# Patient Record
Sex: Female | Born: 1935 | Race: White | Hispanic: No | Smoking: Former smoker
Health system: Southern US, Community
[De-identification: ages and names within clinical notes are randomized; demographics above are authoritative.]

## PROBLEM LIST (undated history)

## (undated) DIAGNOSIS — F32A Depression, unspecified: Secondary | ICD-10-CM

## (undated) DIAGNOSIS — R011 Cardiac murmur, unspecified: Secondary | ICD-10-CM

## (undated) DIAGNOSIS — K219 Gastro-esophageal reflux disease without esophagitis: Secondary | ICD-10-CM

## (undated) DIAGNOSIS — G629 Polyneuropathy, unspecified: Secondary | ICD-10-CM

## (undated) DIAGNOSIS — E039 Hypothyroidism, unspecified: Secondary | ICD-10-CM

## (undated) DIAGNOSIS — F329 Major depressive disorder, single episode, unspecified: Secondary | ICD-10-CM

## (undated) DIAGNOSIS — G4730 Sleep apnea, unspecified: Secondary | ICD-10-CM

## (undated) DIAGNOSIS — M5136 Other intervertebral disc degeneration, lumbar region: Secondary | ICD-10-CM

## (undated) DIAGNOSIS — C4490 Unspecified malignant neoplasm of skin, unspecified: Secondary | ICD-10-CM

## (undated) DIAGNOSIS — C801 Malignant (primary) neoplasm, unspecified: Secondary | ICD-10-CM

## (undated) DIAGNOSIS — H269 Unspecified cataract: Secondary | ICD-10-CM

## (undated) DIAGNOSIS — T7840XA Allergy, unspecified, initial encounter: Secondary | ICD-10-CM

## (undated) DIAGNOSIS — J45909 Unspecified asthma, uncomplicated: Secondary | ICD-10-CM

## (undated) DIAGNOSIS — E785 Hyperlipidemia, unspecified: Secondary | ICD-10-CM

## (undated) DIAGNOSIS — E079 Disorder of thyroid, unspecified: Secondary | ICD-10-CM

## (undated) HISTORY — DX: Malignant (primary) neoplasm, unspecified: C80.1

## (undated) HISTORY — DX: Hyperlipidemia, unspecified: E78.5

## (undated) HISTORY — DX: Disorder of thyroid, unspecified: E07.9

## (undated) HISTORY — PX: SPINE SURGERY: SHX786

## (undated) HISTORY — DX: Allergy, unspecified, initial encounter: T78.40XA

## (undated) HISTORY — DX: Unspecified cataract: H26.9

## (undated) HISTORY — DX: Other intervertebral disc degeneration, lumbar region: M51.36

## (undated) HISTORY — DX: Cardiac murmur, unspecified: R01.1

## (undated) HISTORY — DX: Unspecified asthma, uncomplicated: J45.909

## (undated) HISTORY — DX: Polyneuropathy, unspecified: G62.9

## (undated) HISTORY — DX: Gastro-esophageal reflux disease without esophagitis: K21.9

## (undated) HISTORY — PX: CHOLECYSTECTOMY: SHX55

## (undated) HISTORY — DX: Major depressive disorder, single episode, unspecified: F32.9

## (undated) HISTORY — DX: Depression, unspecified: F32.A

---
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
## 2010-09-15 HISTORY — PX: CATARACT EXTRACTION: SUR2

## 2011-09-16 HISTORY — PX: OTHER SURGICAL HISTORY: SHX169

## 2013-09-15 HISTORY — PX: CARPAL TUNNEL RELEASE: SHX101

## 2013-09-15 HISTORY — PX: SALIVARY GLAND SURGERY: SHX768

## 2014-03-24 DIAGNOSIS — K118 Other diseases of salivary glands: Secondary | ICD-10-CM | POA: Insufficient documentation

## 2015-06-28 DIAGNOSIS — R202 Paresthesia of skin: Secondary | ICD-10-CM | POA: Insufficient documentation

## 2015-06-28 DIAGNOSIS — E785 Hyperlipidemia, unspecified: Secondary | ICD-10-CM | POA: Insufficient documentation

## 2015-06-28 DIAGNOSIS — D119 Benign neoplasm of major salivary gland, unspecified: Secondary | ICD-10-CM | POA: Insufficient documentation

## 2015-06-28 DIAGNOSIS — L403 Pustulosis palmaris et plantaris: Secondary | ICD-10-CM | POA: Insufficient documentation

## 2015-06-28 DIAGNOSIS — R911 Solitary pulmonary nodule: Secondary | ICD-10-CM | POA: Insufficient documentation

## 2015-06-28 DIAGNOSIS — R1031 Right lower quadrant pain: Secondary | ICD-10-CM | POA: Insufficient documentation

## 2015-06-28 DIAGNOSIS — M25552 Pain in left hip: Secondary | ICD-10-CM | POA: Insufficient documentation

## 2015-06-28 DIAGNOSIS — R1012 Left upper quadrant pain: Secondary | ICD-10-CM | POA: Insufficient documentation

## 2015-06-28 DIAGNOSIS — M25551 Pain in right hip: Secondary | ICD-10-CM | POA: Insufficient documentation

## 2015-06-28 DIAGNOSIS — R1032 Left lower quadrant pain: Secondary | ICD-10-CM | POA: Insufficient documentation

## 2015-06-28 DIAGNOSIS — R5383 Other fatigue: Secondary | ICD-10-CM | POA: Insufficient documentation

## 2015-06-28 DIAGNOSIS — J340 Abscess, furuncle and carbuncle of nose: Secondary | ICD-10-CM | POA: Insufficient documentation

## 2015-06-28 DIAGNOSIS — C44310 Basal cell carcinoma of skin of unspecified parts of face: Secondary | ICD-10-CM | POA: Insufficient documentation

## 2015-06-28 DIAGNOSIS — G8929 Other chronic pain: Secondary | ICD-10-CM | POA: Insufficient documentation

## 2015-06-28 DIAGNOSIS — M2550 Pain in unspecified joint: Secondary | ICD-10-CM | POA: Insufficient documentation

## 2015-07-05 DIAGNOSIS — E039 Hypothyroidism, unspecified: Secondary | ICD-10-CM | POA: Insufficient documentation

## 2015-07-27 DIAGNOSIS — M4802 Spinal stenosis, cervical region: Secondary | ICD-10-CM | POA: Insufficient documentation

## 2015-07-27 DIAGNOSIS — M4310 Spondylolisthesis, site unspecified: Secondary | ICD-10-CM | POA: Insufficient documentation

## 2015-07-27 DIAGNOSIS — M47816 Spondylosis without myelopathy or radiculopathy, lumbar region: Secondary | ICD-10-CM | POA: Insufficient documentation

## 2015-08-20 DIAGNOSIS — C679 Malignant neoplasm of bladder, unspecified: Secondary | ICD-10-CM | POA: Insufficient documentation

## 2015-09-21 DIAGNOSIS — C672 Malignant neoplasm of lateral wall of bladder: Secondary | ICD-10-CM | POA: Diagnosis not present

## 2015-09-28 DIAGNOSIS — C672 Malignant neoplasm of lateral wall of bladder: Secondary | ICD-10-CM | POA: Diagnosis not present

## 2015-10-05 DIAGNOSIS — C672 Malignant neoplasm of lateral wall of bladder: Secondary | ICD-10-CM | POA: Diagnosis not present

## 2015-10-12 DIAGNOSIS — C672 Malignant neoplasm of lateral wall of bladder: Secondary | ICD-10-CM | POA: Diagnosis not present

## 2015-10-19 DIAGNOSIS — C672 Malignant neoplasm of lateral wall of bladder: Secondary | ICD-10-CM | POA: Diagnosis not present

## 2015-10-26 DIAGNOSIS — C672 Malignant neoplasm of lateral wall of bladder: Secondary | ICD-10-CM | POA: Diagnosis not present

## 2015-11-03 DIAGNOSIS — M5410 Radiculopathy, site unspecified: Secondary | ICD-10-CM | POA: Diagnosis not present

## 2015-11-03 DIAGNOSIS — L0100 Impetigo, unspecified: Secondary | ICD-10-CM | POA: Diagnosis not present

## 2015-11-07 DIAGNOSIS — C44729 Squamous cell carcinoma of skin of left lower limb, including hip: Secondary | ICD-10-CM | POA: Diagnosis not present

## 2015-11-15 DIAGNOSIS — D119 Benign neoplasm of major salivary gland, unspecified: Secondary | ICD-10-CM | POA: Diagnosis not present

## 2015-11-15 DIAGNOSIS — R220 Localized swelling, mass and lump, head: Secondary | ICD-10-CM | POA: Diagnosis not present

## 2015-11-19 DIAGNOSIS — R42 Dizziness and giddiness: Secondary | ICD-10-CM | POA: Diagnosis not present

## 2015-12-07 DIAGNOSIS — C679 Malignant neoplasm of bladder, unspecified: Secondary | ICD-10-CM | POA: Diagnosis not present

## 2015-12-10 DIAGNOSIS — J069 Acute upper respiratory infection, unspecified: Secondary | ICD-10-CM | POA: Diagnosis not present

## 2015-12-10 DIAGNOSIS — C679 Malignant neoplasm of bladder, unspecified: Secondary | ICD-10-CM | POA: Diagnosis not present

## 2015-12-10 DIAGNOSIS — R5383 Other fatigue: Secondary | ICD-10-CM | POA: Diagnosis not present

## 2015-12-10 DIAGNOSIS — B9789 Other viral agents as the cause of diseases classified elsewhere: Secondary | ICD-10-CM | POA: Diagnosis not present

## 2015-12-21 DIAGNOSIS — D494 Neoplasm of unspecified behavior of bladder: Secondary | ICD-10-CM | POA: Diagnosis not present

## 2015-12-21 DIAGNOSIS — R399 Unspecified symptoms and signs involving the genitourinary system: Secondary | ICD-10-CM | POA: Diagnosis not present

## 2015-12-21 DIAGNOSIS — Z713 Dietary counseling and surveillance: Secondary | ICD-10-CM | POA: Diagnosis not present

## 2015-12-21 DIAGNOSIS — Z8551 Personal history of malignant neoplasm of bladder: Secondary | ICD-10-CM | POA: Diagnosis not present

## 2015-12-24 DIAGNOSIS — E279 Disorder of adrenal gland, unspecified: Secondary | ICD-10-CM | POA: Diagnosis not present

## 2015-12-24 DIAGNOSIS — Z8551 Personal history of malignant neoplasm of bladder: Secondary | ICD-10-CM | POA: Diagnosis not present

## 2015-12-24 DIAGNOSIS — D494 Neoplasm of unspecified behavior of bladder: Secondary | ICD-10-CM | POA: Diagnosis not present

## 2015-12-24 DIAGNOSIS — C679 Malignant neoplasm of bladder, unspecified: Secondary | ICD-10-CM | POA: Diagnosis not present

## 2016-01-02 DIAGNOSIS — Z01812 Encounter for preprocedural laboratory examination: Secondary | ICD-10-CM | POA: Diagnosis not present

## 2016-01-02 DIAGNOSIS — D494 Neoplasm of unspecified behavior of bladder: Secondary | ICD-10-CM | POA: Diagnosis not present

## 2016-01-08 DIAGNOSIS — M5442 Lumbago with sciatica, left side: Secondary | ICD-10-CM | POA: Diagnosis not present

## 2016-01-08 DIAGNOSIS — E782 Mixed hyperlipidemia: Secondary | ICD-10-CM | POA: Diagnosis not present

## 2016-01-08 DIAGNOSIS — M438X9 Other specified deforming dorsopathies, site unspecified: Secondary | ICD-10-CM | POA: Diagnosis not present

## 2016-01-08 DIAGNOSIS — K219 Gastro-esophageal reflux disease without esophagitis: Secondary | ICD-10-CM | POA: Diagnosis not present

## 2016-01-08 DIAGNOSIS — E038 Other specified hypothyroidism: Secondary | ICD-10-CM | POA: Diagnosis not present

## 2016-01-08 DIAGNOSIS — Z1389 Encounter for screening for other disorder: Secondary | ICD-10-CM | POA: Diagnosis not present

## 2016-01-08 DIAGNOSIS — I519 Heart disease, unspecified: Secondary | ICD-10-CM | POA: Diagnosis not present

## 2016-01-10 DIAGNOSIS — C679 Malignant neoplasm of bladder, unspecified: Secondary | ICD-10-CM | POA: Diagnosis not present

## 2016-01-15 DIAGNOSIS — J45909 Unspecified asthma, uncomplicated: Secondary | ICD-10-CM | POA: Diagnosis not present

## 2016-01-15 DIAGNOSIS — N3020 Other chronic cystitis without hematuria: Secondary | ICD-10-CM | POA: Diagnosis not present

## 2016-01-15 DIAGNOSIS — D494 Neoplasm of unspecified behavior of bladder: Secondary | ICD-10-CM | POA: Diagnosis not present

## 2016-01-15 DIAGNOSIS — N329 Bladder disorder, unspecified: Secondary | ICD-10-CM | POA: Diagnosis not present

## 2016-01-15 DIAGNOSIS — Z8551 Personal history of malignant neoplasm of bladder: Secondary | ICD-10-CM | POA: Diagnosis not present

## 2016-01-23 DIAGNOSIS — E038 Other specified hypothyroidism: Secondary | ICD-10-CM | POA: Diagnosis not present

## 2016-01-23 DIAGNOSIS — E782 Mixed hyperlipidemia: Secondary | ICD-10-CM | POA: Diagnosis not present

## 2016-01-23 DIAGNOSIS — Z8551 Personal history of malignant neoplasm of bladder: Secondary | ICD-10-CM | POA: Diagnosis not present

## 2016-01-23 DIAGNOSIS — R399 Unspecified symptoms and signs involving the genitourinary system: Secondary | ICD-10-CM | POA: Diagnosis not present

## 2016-01-28 DIAGNOSIS — N390 Urinary tract infection, site not specified: Secondary | ICD-10-CM | POA: Diagnosis not present

## 2016-01-28 DIAGNOSIS — E782 Mixed hyperlipidemia: Secondary | ICD-10-CM | POA: Diagnosis not present

## 2016-01-28 DIAGNOSIS — E038 Other specified hypothyroidism: Secondary | ICD-10-CM | POA: Diagnosis not present

## 2016-01-28 DIAGNOSIS — Z6827 Body mass index (BMI) 27.0-27.9, adult: Secondary | ICD-10-CM | POA: Diagnosis not present

## 2016-01-30 DIAGNOSIS — M47814 Spondylosis without myelopathy or radiculopathy, thoracic region: Secondary | ICD-10-CM | POA: Diagnosis not present

## 2016-01-30 DIAGNOSIS — M47816 Spondylosis without myelopathy or radiculopathy, lumbar region: Secondary | ICD-10-CM | POA: Diagnosis not present

## 2016-01-30 DIAGNOSIS — R930 Abnormal findings on diagnostic imaging of skull and head, not elsewhere classified: Secondary | ICD-10-CM | POA: Diagnosis not present

## 2016-01-30 DIAGNOSIS — R948 Abnormal results of function studies of other organs and systems: Secondary | ICD-10-CM | POA: Diagnosis not present

## 2016-01-30 DIAGNOSIS — C679 Malignant neoplasm of bladder, unspecified: Secondary | ICD-10-CM | POA: Diagnosis not present

## 2016-01-30 DIAGNOSIS — M4854XA Collapsed vertebra, not elsewhere classified, thoracic region, initial encounter for fracture: Secondary | ICD-10-CM | POA: Diagnosis not present

## 2016-01-30 DIAGNOSIS — I081 Rheumatic disorders of both mitral and tricuspid valves: Secondary | ICD-10-CM | POA: Diagnosis not present

## 2016-02-05 DIAGNOSIS — C679 Malignant neoplasm of bladder, unspecified: Secondary | ICD-10-CM | POA: Diagnosis not present

## 2016-02-08 DIAGNOSIS — M5116 Intervertebral disc disorders with radiculopathy, lumbar region: Secondary | ICD-10-CM | POA: Diagnosis not present

## 2016-02-08 DIAGNOSIS — R51 Headache: Secondary | ICD-10-CM | POA: Diagnosis not present

## 2016-02-08 DIAGNOSIS — M5106 Intervertebral disc disorders with myelopathy, lumbar region: Secondary | ICD-10-CM | POA: Diagnosis not present

## 2016-02-08 DIAGNOSIS — C679 Malignant neoplasm of bladder, unspecified: Secondary | ICD-10-CM | POA: Diagnosis not present

## 2016-02-08 DIAGNOSIS — M899 Disorder of bone, unspecified: Secondary | ICD-10-CM | POA: Diagnosis not present

## 2016-02-08 DIAGNOSIS — R930 Abnormal findings on diagnostic imaging of skull and head, not elsewhere classified: Secondary | ICD-10-CM | POA: Diagnosis not present

## 2016-02-08 DIAGNOSIS — M4726 Other spondylosis with radiculopathy, lumbar region: Secondary | ICD-10-CM | POA: Diagnosis not present

## 2016-02-21 DIAGNOSIS — L989 Disorder of the skin and subcutaneous tissue, unspecified: Secondary | ICD-10-CM | POA: Diagnosis not present

## 2016-02-21 DIAGNOSIS — I519 Heart disease, unspecified: Secondary | ICD-10-CM | POA: Diagnosis not present

## 2016-02-21 DIAGNOSIS — E782 Mixed hyperlipidemia: Secondary | ICD-10-CM | POA: Diagnosis not present

## 2016-02-21 DIAGNOSIS — Z6827 Body mass index (BMI) 27.0-27.9, adult: Secondary | ICD-10-CM | POA: Diagnosis not present

## 2016-02-25 DIAGNOSIS — D485 Neoplasm of uncertain behavior of skin: Secondary | ICD-10-CM | POA: Diagnosis not present

## 2016-02-25 DIAGNOSIS — R21 Rash and other nonspecific skin eruption: Secondary | ICD-10-CM | POA: Diagnosis not present

## 2016-02-25 DIAGNOSIS — L570 Actinic keratosis: Secondary | ICD-10-CM | POA: Diagnosis not present

## 2016-02-25 DIAGNOSIS — L821 Other seborrheic keratosis: Secondary | ICD-10-CM | POA: Diagnosis not present

## 2016-02-25 DIAGNOSIS — C44722 Squamous cell carcinoma of skin of right lower limb, including hip: Secondary | ICD-10-CM | POA: Diagnosis not present

## 2016-02-28 DIAGNOSIS — C679 Malignant neoplasm of bladder, unspecified: Secondary | ICD-10-CM | POA: Diagnosis not present

## 2016-03-03 DIAGNOSIS — E279 Disorder of adrenal gland, unspecified: Secondary | ICD-10-CM | POA: Diagnosis not present

## 2016-03-03 DIAGNOSIS — N281 Cyst of kidney, acquired: Secondary | ICD-10-CM | POA: Diagnosis not present

## 2016-03-03 DIAGNOSIS — C679 Malignant neoplasm of bladder, unspecified: Secondary | ICD-10-CM | POA: Diagnosis not present

## 2016-03-11 DIAGNOSIS — C44722 Squamous cell carcinoma of skin of right lower limb, including hip: Secondary | ICD-10-CM | POA: Diagnosis not present

## 2016-03-11 DIAGNOSIS — L570 Actinic keratosis: Secondary | ICD-10-CM | POA: Diagnosis not present

## 2016-03-14 DIAGNOSIS — E038 Other specified hypothyroidism: Secondary | ICD-10-CM | POA: Diagnosis not present

## 2016-03-26 DIAGNOSIS — M1990 Unspecified osteoarthritis, unspecified site: Secondary | ICD-10-CM | POA: Diagnosis not present

## 2016-03-26 DIAGNOSIS — E279 Disorder of adrenal gland, unspecified: Secondary | ICD-10-CM | POA: Diagnosis not present

## 2016-03-26 DIAGNOSIS — E038 Other specified hypothyroidism: Secondary | ICD-10-CM | POA: Diagnosis not present

## 2016-03-26 DIAGNOSIS — K769 Liver disease, unspecified: Secondary | ICD-10-CM | POA: Diagnosis not present

## 2016-03-26 DIAGNOSIS — M5442 Lumbago with sciatica, left side: Secondary | ICD-10-CM | POA: Diagnosis not present

## 2016-03-26 DIAGNOSIS — M9983 Other biomechanical lesions of lumbar region: Secondary | ICD-10-CM | POA: Diagnosis not present

## 2016-04-04 DIAGNOSIS — M4806 Spinal stenosis, lumbar region: Secondary | ICD-10-CM | POA: Diagnosis not present

## 2016-04-08 DIAGNOSIS — L570 Actinic keratosis: Secondary | ICD-10-CM | POA: Diagnosis not present

## 2016-04-16 DIAGNOSIS — M4806 Spinal stenosis, lumbar region: Secondary | ICD-10-CM | POA: Diagnosis not present

## 2016-04-16 DIAGNOSIS — M5416 Radiculopathy, lumbar region: Secondary | ICD-10-CM | POA: Diagnosis not present

## 2016-04-29 DIAGNOSIS — D485 Neoplasm of uncertain behavior of skin: Secondary | ICD-10-CM | POA: Diagnosis not present

## 2016-04-29 DIAGNOSIS — L570 Actinic keratosis: Secondary | ICD-10-CM | POA: Diagnosis not present

## 2016-04-29 DIAGNOSIS — C44722 Squamous cell carcinoma of skin of right lower limb, including hip: Secondary | ICD-10-CM | POA: Diagnosis not present

## 2016-05-06 DIAGNOSIS — C44722 Squamous cell carcinoma of skin of right lower limb, including hip: Secondary | ICD-10-CM | POA: Diagnosis not present

## 2016-05-06 DIAGNOSIS — L570 Actinic keratosis: Secondary | ICD-10-CM | POA: Diagnosis not present

## 2016-05-07 DIAGNOSIS — M5416 Radiculopathy, lumbar region: Secondary | ICD-10-CM | POA: Diagnosis not present

## 2016-05-07 DIAGNOSIS — M4806 Spinal stenosis, lumbar region: Secondary | ICD-10-CM | POA: Diagnosis not present

## 2016-05-13 DIAGNOSIS — R5383 Other fatigue: Secondary | ICD-10-CM | POA: Diagnosis not present

## 2016-05-13 DIAGNOSIS — H479 Unspecified disorder of visual pathways: Secondary | ICD-10-CM | POA: Diagnosis not present

## 2016-05-13 DIAGNOSIS — H00011 Hordeolum externum right upper eyelid: Secondary | ICD-10-CM | POA: Diagnosis not present

## 2016-06-03 DIAGNOSIS — Z48817 Encounter for surgical aftercare following surgery on the skin and subcutaneous tissue: Secondary | ICD-10-CM | POA: Diagnosis not present

## 2016-06-11 DIAGNOSIS — M4806 Spinal stenosis, lumbar region: Secondary | ICD-10-CM | POA: Diagnosis not present

## 2016-06-11 DIAGNOSIS — M5416 Radiculopathy, lumbar region: Secondary | ICD-10-CM | POA: Diagnosis not present

## 2016-07-03 DIAGNOSIS — M48061 Spinal stenosis, lumbar region without neurogenic claudication: Secondary | ICD-10-CM | POA: Diagnosis not present

## 2016-07-04 DIAGNOSIS — M25462 Effusion, left knee: Secondary | ICD-10-CM | POA: Diagnosis not present

## 2016-07-04 DIAGNOSIS — M170 Bilateral primary osteoarthritis of knee: Secondary | ICD-10-CM | POA: Diagnosis not present

## 2016-07-04 DIAGNOSIS — M25461 Effusion, right knee: Secondary | ICD-10-CM | POA: Diagnosis not present

## 2016-07-04 DIAGNOSIS — R5383 Other fatigue: Secondary | ICD-10-CM | POA: Diagnosis not present

## 2016-07-07 DIAGNOSIS — Z23 Encounter for immunization: Secondary | ICD-10-CM | POA: Diagnosis not present

## 2016-07-11 DIAGNOSIS — R5383 Other fatigue: Secondary | ICD-10-CM | POA: Diagnosis not present

## 2016-07-11 DIAGNOSIS — E038 Other specified hypothyroidism: Secondary | ICD-10-CM | POA: Diagnosis not present

## 2016-07-11 DIAGNOSIS — R7989 Other specified abnormal findings of blood chemistry: Secondary | ICD-10-CM | POA: Diagnosis not present

## 2016-07-11 DIAGNOSIS — I519 Heart disease, unspecified: Secondary | ICD-10-CM | POA: Diagnosis not present

## 2016-07-11 DIAGNOSIS — Z6828 Body mass index (BMI) 28.0-28.9, adult: Secondary | ICD-10-CM | POA: Diagnosis not present

## 2016-07-11 DIAGNOSIS — E063 Autoimmune thyroiditis: Secondary | ICD-10-CM | POA: Diagnosis not present

## 2016-07-11 DIAGNOSIS — Z0000 Encounter for general adult medical examination without abnormal findings: Secondary | ICD-10-CM | POA: Diagnosis not present

## 2016-07-11 DIAGNOSIS — I499 Cardiac arrhythmia, unspecified: Secondary | ICD-10-CM | POA: Diagnosis not present

## 2016-07-11 DIAGNOSIS — E663 Overweight: Secondary | ICD-10-CM | POA: Diagnosis not present

## 2016-07-11 DIAGNOSIS — E785 Hyperlipidemia, unspecified: Secondary | ICD-10-CM | POA: Diagnosis not present

## 2016-07-11 DIAGNOSIS — I491 Atrial premature depolarization: Secondary | ICD-10-CM | POA: Diagnosis not present

## 2016-07-14 DIAGNOSIS — M48061 Spinal stenosis, lumbar region without neurogenic claudication: Secondary | ICD-10-CM | POA: Diagnosis not present

## 2016-07-14 DIAGNOSIS — M4316 Spondylolisthesis, lumbar region: Secondary | ICD-10-CM | POA: Diagnosis not present

## 2016-07-17 DIAGNOSIS — R9431 Abnormal electrocardiogram [ECG] [EKG]: Secondary | ICD-10-CM | POA: Diagnosis not present

## 2016-07-17 DIAGNOSIS — R600 Localized edema: Secondary | ICD-10-CM | POA: Diagnosis not present

## 2016-07-17 DIAGNOSIS — K219 Gastro-esophageal reflux disease without esophagitis: Secondary | ICD-10-CM | POA: Diagnosis not present

## 2016-07-17 DIAGNOSIS — E782 Mixed hyperlipidemia: Secondary | ICD-10-CM | POA: Diagnosis not present

## 2016-07-17 DIAGNOSIS — I872 Venous insufficiency (chronic) (peripheral): Secondary | ICD-10-CM | POA: Diagnosis not present

## 2016-07-17 DIAGNOSIS — R011 Cardiac murmur, unspecified: Secondary | ICD-10-CM | POA: Diagnosis not present

## 2016-07-17 DIAGNOSIS — Z87891 Personal history of nicotine dependence: Secondary | ICD-10-CM | POA: Diagnosis not present

## 2016-07-25 DIAGNOSIS — M48061 Spinal stenosis, lumbar region without neurogenic claudication: Secondary | ICD-10-CM | POA: Diagnosis not present

## 2016-07-25 DIAGNOSIS — M4316 Spondylolisthesis, lumbar region: Secondary | ICD-10-CM | POA: Diagnosis not present

## 2016-07-25 DIAGNOSIS — M545 Low back pain: Secondary | ICD-10-CM | POA: Diagnosis not present

## 2016-08-04 DIAGNOSIS — L821 Other seborrheic keratosis: Secondary | ICD-10-CM | POA: Diagnosis not present

## 2016-08-04 DIAGNOSIS — L400 Psoriasis vulgaris: Secondary | ICD-10-CM | POA: Diagnosis not present

## 2016-08-04 DIAGNOSIS — L570 Actinic keratosis: Secondary | ICD-10-CM | POA: Diagnosis not present

## 2016-08-04 DIAGNOSIS — L661 Lichen planopilaris: Secondary | ICD-10-CM | POA: Diagnosis not present

## 2016-08-04 DIAGNOSIS — D485 Neoplasm of uncertain behavior of skin: Secondary | ICD-10-CM | POA: Diagnosis not present

## 2016-08-06 DIAGNOSIS — L309 Dermatitis, unspecified: Secondary | ICD-10-CM | POA: Diagnosis not present

## 2016-08-12 DIAGNOSIS — M170 Bilateral primary osteoarthritis of knee: Secondary | ICD-10-CM | POA: Diagnosis not present

## 2016-08-12 DIAGNOSIS — M48061 Spinal stenosis, lumbar region without neurogenic claudication: Secondary | ICD-10-CM | POA: Diagnosis not present

## 2016-08-12 DIAGNOSIS — Z5181 Encounter for therapeutic drug level monitoring: Secondary | ICD-10-CM | POA: Diagnosis not present

## 2016-08-12 DIAGNOSIS — Z79891 Long term (current) use of opiate analgesic: Secondary | ICD-10-CM | POA: Diagnosis not present

## 2016-08-15 DIAGNOSIS — M5416 Radiculopathy, lumbar region: Secondary | ICD-10-CM | POA: Diagnosis not present

## 2016-08-18 DIAGNOSIS — M5416 Radiculopathy, lumbar region: Secondary | ICD-10-CM | POA: Diagnosis not present

## 2016-08-20 DIAGNOSIS — M170 Bilateral primary osteoarthritis of knee: Secondary | ICD-10-CM | POA: Diagnosis not present

## 2016-08-20 DIAGNOSIS — M5416 Radiculopathy, lumbar region: Secondary | ICD-10-CM | POA: Diagnosis not present

## 2016-08-29 DIAGNOSIS — J069 Acute upper respiratory infection, unspecified: Secondary | ICD-10-CM | POA: Diagnosis not present

## 2016-09-02 DIAGNOSIS — M170 Bilateral primary osteoarthritis of knee: Secondary | ICD-10-CM | POA: Diagnosis not present

## 2016-09-04 DIAGNOSIS — M5416 Radiculopathy, lumbar region: Secondary | ICD-10-CM | POA: Diagnosis not present

## 2016-09-05 DIAGNOSIS — J069 Acute upper respiratory infection, unspecified: Secondary | ICD-10-CM | POA: Diagnosis not present

## 2016-09-11 DIAGNOSIS — M5416 Radiculopathy, lumbar region: Secondary | ICD-10-CM | POA: Diagnosis not present

## 2016-09-17 DIAGNOSIS — M5416 Radiculopathy, lumbar region: Secondary | ICD-10-CM | POA: Diagnosis not present

## 2016-09-19 DIAGNOSIS — I8393 Asymptomatic varicose veins of bilateral lower extremities: Secondary | ICD-10-CM | POA: Diagnosis not present

## 2016-09-19 DIAGNOSIS — L408 Other psoriasis: Secondary | ICD-10-CM | POA: Diagnosis not present

## 2016-09-23 DIAGNOSIS — M5416 Radiculopathy, lumbar region: Secondary | ICD-10-CM | POA: Diagnosis not present

## 2016-09-29 DIAGNOSIS — M170 Bilateral primary osteoarthritis of knee: Secondary | ICD-10-CM | POA: Diagnosis not present

## 2016-09-30 DIAGNOSIS — M5416 Radiculopathy, lumbar region: Secondary | ICD-10-CM | POA: Diagnosis not present

## 2016-10-03 DIAGNOSIS — M5416 Radiculopathy, lumbar region: Secondary | ICD-10-CM | POA: Diagnosis not present

## 2016-10-14 DIAGNOSIS — M5416 Radiculopathy, lumbar region: Secondary | ICD-10-CM | POA: Diagnosis not present

## 2016-10-21 DIAGNOSIS — M170 Bilateral primary osteoarthritis of knee: Secondary | ICD-10-CM | POA: Diagnosis not present

## 2016-10-21 DIAGNOSIS — M48061 Spinal stenosis, lumbar region without neurogenic claudication: Secondary | ICD-10-CM | POA: Diagnosis not present

## 2016-10-22 DIAGNOSIS — L408 Other psoriasis: Secondary | ICD-10-CM | POA: Diagnosis not present

## 2016-10-28 DIAGNOSIS — Z8551 Personal history of malignant neoplasm of bladder: Secondary | ICD-10-CM | POA: Diagnosis not present

## 2016-11-04 DIAGNOSIS — M25552 Pain in left hip: Secondary | ICD-10-CM | POA: Diagnosis not present

## 2016-11-04 DIAGNOSIS — M1612 Unilateral primary osteoarthritis, left hip: Secondary | ICD-10-CM | POA: Diagnosis not present

## 2016-11-04 DIAGNOSIS — M810 Age-related osteoporosis without current pathological fracture: Secondary | ICD-10-CM | POA: Diagnosis not present

## 2016-11-06 DIAGNOSIS — M1612 Unilateral primary osteoarthritis, left hip: Secondary | ICD-10-CM | POA: Diagnosis not present

## 2016-11-06 DIAGNOSIS — M810 Age-related osteoporosis without current pathological fracture: Secondary | ICD-10-CM | POA: Diagnosis not present

## 2016-11-10 DIAGNOSIS — M67952 Unspecified disorder of synovium and tendon, left thigh: Secondary | ICD-10-CM | POA: Diagnosis not present

## 2016-11-10 DIAGNOSIS — M5416 Radiculopathy, lumbar region: Secondary | ICD-10-CM | POA: Diagnosis not present

## 2016-11-10 DIAGNOSIS — E785 Hyperlipidemia, unspecified: Secondary | ICD-10-CM | POA: Diagnosis not present

## 2016-11-10 DIAGNOSIS — R7989 Other specified abnormal findings of blood chemistry: Secondary | ICD-10-CM | POA: Diagnosis not present

## 2016-11-10 DIAGNOSIS — E038 Other specified hypothyroidism: Secondary | ICD-10-CM | POA: Diagnosis not present

## 2016-11-10 DIAGNOSIS — M25552 Pain in left hip: Secondary | ICD-10-CM | POA: Diagnosis not present

## 2016-11-10 DIAGNOSIS — E063 Autoimmune thyroiditis: Secondary | ICD-10-CM | POA: Diagnosis not present

## 2016-11-13 DIAGNOSIS — I8393 Asymptomatic varicose veins of bilateral lower extremities: Secondary | ICD-10-CM | POA: Diagnosis not present

## 2016-11-13 DIAGNOSIS — L408 Other psoriasis: Secondary | ICD-10-CM | POA: Diagnosis not present

## 2016-11-18 DIAGNOSIS — M25552 Pain in left hip: Secondary | ICD-10-CM | POA: Diagnosis not present

## 2016-11-21 DIAGNOSIS — R51 Headache: Secondary | ICD-10-CM | POA: Diagnosis not present

## 2016-11-21 DIAGNOSIS — J019 Acute sinusitis, unspecified: Secondary | ICD-10-CM | POA: Diagnosis not present

## 2016-11-21 DIAGNOSIS — M25552 Pain in left hip: Secondary | ICD-10-CM | POA: Diagnosis not present

## 2016-11-25 DIAGNOSIS — M25552 Pain in left hip: Secondary | ICD-10-CM | POA: Diagnosis not present

## 2016-11-27 DIAGNOSIS — M25552 Pain in left hip: Secondary | ICD-10-CM | POA: Diagnosis not present

## 2016-12-02 DIAGNOSIS — R51 Headache: Secondary | ICD-10-CM | POA: Diagnosis not present

## 2016-12-02 DIAGNOSIS — M25552 Pain in left hip: Secondary | ICD-10-CM | POA: Diagnosis not present

## 2016-12-03 DIAGNOSIS — G4459 Other complicated headache syndrome: Secondary | ICD-10-CM | POA: Diagnosis not present

## 2016-12-04 DIAGNOSIS — G4459 Other complicated headache syndrome: Secondary | ICD-10-CM | POA: Diagnosis not present

## 2016-12-04 DIAGNOSIS — H538 Other visual disturbances: Secondary | ICD-10-CM | POA: Diagnosis not present

## 2016-12-04 DIAGNOSIS — R700 Elevated erythrocyte sedimentation rate: Secondary | ICD-10-CM | POA: Diagnosis not present

## 2016-12-04 DIAGNOSIS — R51 Headache: Secondary | ICD-10-CM | POA: Diagnosis not present

## 2016-12-04 DIAGNOSIS — Z87891 Personal history of nicotine dependence: Secondary | ICD-10-CM | POA: Diagnosis not present

## 2016-12-15 DIAGNOSIS — G894 Chronic pain syndrome: Secondary | ICD-10-CM | POA: Diagnosis not present

## 2016-12-15 DIAGNOSIS — B370 Candidal stomatitis: Secondary | ICD-10-CM | POA: Diagnosis not present

## 2016-12-23 DIAGNOSIS — M170 Bilateral primary osteoarthritis of knee: Secondary | ICD-10-CM | POA: Diagnosis not present

## 2016-12-23 DIAGNOSIS — Z79891 Long term (current) use of opiate analgesic: Secondary | ICD-10-CM | POA: Diagnosis not present

## 2016-12-24 DIAGNOSIS — M25552 Pain in left hip: Secondary | ICD-10-CM | POA: Diagnosis not present

## 2016-12-24 DIAGNOSIS — Z9889 Other specified postprocedural states: Secondary | ICD-10-CM | POA: Diagnosis not present

## 2016-12-25 DIAGNOSIS — L408 Other psoriasis: Secondary | ICD-10-CM | POA: Diagnosis not present

## 2016-12-29 DIAGNOSIS — M25552 Pain in left hip: Secondary | ICD-10-CM | POA: Diagnosis not present

## 2017-01-02 DIAGNOSIS — M25552 Pain in left hip: Secondary | ICD-10-CM | POA: Diagnosis not present

## 2017-01-09 DIAGNOSIS — M25552 Pain in left hip: Secondary | ICD-10-CM | POA: Diagnosis not present

## 2017-01-12 DIAGNOSIS — L570 Actinic keratosis: Secondary | ICD-10-CM | POA: Diagnosis not present

## 2017-01-12 DIAGNOSIS — L905 Scar conditions and fibrosis of skin: Secondary | ICD-10-CM | POA: Diagnosis not present

## 2017-01-12 DIAGNOSIS — D485 Neoplasm of uncertain behavior of skin: Secondary | ICD-10-CM | POA: Diagnosis not present

## 2017-01-12 DIAGNOSIS — S80922A Unspecified superficial injury of left lower leg, initial encounter: Secondary | ICD-10-CM | POA: Diagnosis not present

## 2017-02-03 DIAGNOSIS — G8929 Other chronic pain: Secondary | ICD-10-CM | POA: Diagnosis not present

## 2017-02-03 DIAGNOSIS — E039 Hypothyroidism, unspecified: Secondary | ICD-10-CM | POA: Diagnosis not present

## 2017-02-03 DIAGNOSIS — Z7689 Persons encountering health services in other specified circumstances: Secondary | ICD-10-CM | POA: Diagnosis not present

## 2017-02-03 DIAGNOSIS — H539 Unspecified visual disturbance: Secondary | ICD-10-CM | POA: Diagnosis not present

## 2017-02-03 DIAGNOSIS — M545 Low back pain: Secondary | ICD-10-CM | POA: Diagnosis not present

## 2017-02-06 DIAGNOSIS — D0472 Carcinoma in situ of skin of left lower limb, including hip: Secondary | ICD-10-CM | POA: Diagnosis not present

## 2017-02-06 DIAGNOSIS — L309 Dermatitis, unspecified: Secondary | ICD-10-CM | POA: Diagnosis not present

## 2017-02-06 DIAGNOSIS — D485 Neoplasm of uncertain behavior of skin: Secondary | ICD-10-CM | POA: Diagnosis not present

## 2017-03-05 DIAGNOSIS — G894 Chronic pain syndrome: Secondary | ICD-10-CM | POA: Diagnosis not present

## 2017-03-05 DIAGNOSIS — M5416 Radiculopathy, lumbar region: Secondary | ICD-10-CM | POA: Diagnosis not present

## 2017-03-05 DIAGNOSIS — Z5181 Encounter for therapeutic drug level monitoring: Secondary | ICD-10-CM | POA: Insufficient documentation

## 2017-03-05 DIAGNOSIS — M48062 Spinal stenosis, lumbar region with neurogenic claudication: Secondary | ICD-10-CM | POA: Diagnosis not present

## 2017-03-05 DIAGNOSIS — M797 Fibromyalgia: Secondary | ICD-10-CM | POA: Diagnosis not present

## 2017-03-05 DIAGNOSIS — M5136 Other intervertebral disc degeneration, lumbar region: Secondary | ICD-10-CM | POA: Diagnosis not present

## 2017-03-09 DIAGNOSIS — M545 Low back pain: Secondary | ICD-10-CM | POA: Diagnosis not present

## 2017-03-09 DIAGNOSIS — W57XXXA Bitten or stung by nonvenomous insect and other nonvenomous arthropods, initial encounter: Secondary | ICD-10-CM | POA: Diagnosis not present

## 2017-03-10 DIAGNOSIS — L570 Actinic keratosis: Secondary | ICD-10-CM | POA: Diagnosis not present

## 2017-03-10 DIAGNOSIS — C44729 Squamous cell carcinoma of skin of left lower limb, including hip: Secondary | ICD-10-CM | POA: Diagnosis not present

## 2017-03-30 DIAGNOSIS — L089 Local infection of the skin and subcutaneous tissue, unspecified: Secondary | ICD-10-CM | POA: Diagnosis not present

## 2017-04-06 DIAGNOSIS — H02834 Dermatochalasis of left upper eyelid: Secondary | ICD-10-CM | POA: Diagnosis not present

## 2017-04-06 DIAGNOSIS — H52203 Unspecified astigmatism, bilateral: Secondary | ICD-10-CM | POA: Diagnosis not present

## 2017-04-06 DIAGNOSIS — H02831 Dermatochalasis of right upper eyelid: Secondary | ICD-10-CM | POA: Diagnosis not present

## 2017-04-06 DIAGNOSIS — H524 Presbyopia: Secondary | ICD-10-CM | POA: Diagnosis not present

## 2017-04-06 DIAGNOSIS — Z961 Presence of intraocular lens: Secondary | ICD-10-CM | POA: Diagnosis not present

## 2017-04-16 DIAGNOSIS — L0390 Cellulitis, unspecified: Secondary | ICD-10-CM | POA: Diagnosis not present

## 2017-04-16 DIAGNOSIS — J3489 Other specified disorders of nose and nasal sinuses: Secondary | ICD-10-CM | POA: Diagnosis not present

## 2017-04-30 ENCOUNTER — Encounter: Payer: Self-pay | Admitting: Family Medicine

## 2017-04-30 ENCOUNTER — Ambulatory Visit (INDEPENDENT_AMBULATORY_CARE_PROVIDER_SITE_OTHER): Payer: Medicare Other | Admitting: Family Medicine

## 2017-04-30 VITALS — BP 140/78 | HR 67 | Temp 98.4°F | Ht 65.5 in | Wt 172.4 lb

## 2017-04-30 DIAGNOSIS — M48061 Spinal stenosis, lumbar region without neurogenic claudication: Secondary | ICD-10-CM

## 2017-04-30 DIAGNOSIS — G6289 Other specified polyneuropathies: Secondary | ICD-10-CM

## 2017-04-30 DIAGNOSIS — L03116 Cellulitis of left lower limb: Secondary | ICD-10-CM | POA: Diagnosis not present

## 2017-04-30 DIAGNOSIS — G629 Polyneuropathy, unspecified: Secondary | ICD-10-CM

## 2017-04-30 HISTORY — DX: Polyneuropathy, unspecified: G62.9

## 2017-04-30 MED ORDER — GABAPENTIN 300 MG PO CAPS: 300.0000 mg | ORAL_CAPSULE | Freq: Three times a day (TID) | ORAL | 4 refills | Status: DC

## 2017-04-30 MED ORDER — BACITRACIN 500 UNIT/GM EX OINT: 1.0000 "application " | TOPICAL_OINTMENT | Freq: Two times a day (BID) | CUTANEOUS | 0 refills | Status: DC

## 2017-04-30 MED ORDER — TRAMADOL HCL 50 MG PO TABS: 50.0000 mg | ORAL_TABLET | Freq: Two times a day (BID) | ORAL | 2 refills | Status: DC

## 2017-04-30 NOTE — Progress Notes (Signed)
Pre visit review using our clinic tool,if applicable. No additional management support is needed unless otherwise documented below in the visit note.  

## 2017-04-30 NOTE — Progress Notes (Signed)
[facility] [address], [place], [place] [address] [phone]  Date:  04/30/2017   Name:  Mary Buck   DOB:  [birth year]   MRN:  [id number]  PCP:  Darreld Mclean, MD    Chief Complaint: Establish Care   History of Present Illness:  Mary Buck is a 81 y.o. very pleasant female patient who presents with the following:  Here today as a new patient to establish care  I also take care of her partner Carmie Kanner She had been a pt of [facility] - it looks like she just established care with them in May of this year Per that visit: Chronic Low back pain: Patient admits to "issues with disc 4 and disc 5". Patient's back pain is currently managed with Tramadol and Gabapentin, she was previously managed by Pain Management. Patient would like a new pain management referral at this time.   Hypothyroidism: Patient is currently taking Levothyroxine 88 mcg PO daily without complication. Patient admits to fatigue  Blurred Vision: Chronic with acute worsening, patient currently wears OTC reading glasses. Patient would like to be referred to Ophthalmologist at this time for further evaluation. Patient does have history of bilateral cataract removal in 2014 or 2015. Patient admits to "feeling very sleepy when I try to read".   She does have lumbar stenosis She had bladder cancer in 2013- this has been clear for the last 4 years.  She has a urology appt coming up here in [place] for her annual follow-up visit  She just moved to this area in may of this year- had been in [place], then to [place] for a year, and then to [place].    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 Her leg is still a bit inflamed and tender  She changed back to a more supportive shoe, and this did help with her foot pain She has noted some numbness in her feet for a long time She does not have any history of DM  She does take Neurontin 300 BID and tramadol for her back pain/ spinal stenosis She is also on simvastatin for her cholesterol  She last filled her 2nd tramadol rx on 7/20- a one month supply   BP Readings from Last 3 Encounters:  04/30/17 140/78   Patient Active Problem List   Diagnosis Date Noted  . Spinal stenosis at L4-L5 level 04/30/2017  . Peripheral neuropathy 04/30/2017    Past Medical History:  Diagnosis Date  . Allergy   . Asthma   . Cancer ([place])   . Cataract   . Depression   . Heart murmur   . Hyperlipidemia   . Peripheral neuropathy 04/30/2017  . Thyroid disease     Past Surgical History:  Procedure Laterality Date  . bladder cancer  2013  . CARPAL TUNNEL RELEASE  2015  . CHOLECYSTECTOMY    . TONSILLECTOMY AND ADENOIDECTOMY  1943    Social History  Substance Use Topics  . Smoking status: Not on file  . Smokeless tobacco: Not on file  .  Alcohol use Not on file    Family History  Problem Relation Age of Onset  . Hyperlipidemia Father     Allergies  Allergen Reactions  . Contrast Media [Iodinated Diagnostic Agents]   . Penicillins   . Sulfa Antibiotics     Medication list has been reviewed and updated.  No current outpatient prescriptions on file prior to visit.   No current facility-administered medications on file prior to visit.     Review of Systems:  As per HPI- otherwise negative.    Physical Examination: Vitals:   04/30/17 1527 04/30/17 1617  BP: (!) 124/48 140/78  Pulse: 67   Temp: 98.4 F (36.9 C)   SpO2: 98%    Vitals:   04/30/17 1527  Weight: 172 lb 6.4 oz (78.2 kg)  Height: 5' 5.5" (1.664 m)   Body mass index is  28.25 kg/m. Ideal Body Weight: Weight in (lb) to have BMI = 25: 152.2  GEN: WDWN, NAD, Non-toxic, A & O x 3, overweight, looks well HEENT: Atraumatic, Normocephalic. Neck supple. No masses, No LAD.  Bilateral TM wnl, oropharynx normal.  PEERL,EOMI.   Ears and Nose: No external deformity. CV: RRR, No M/G/R. No JVD. No thrill. No extra heart sounds. PULM: CTA B, no wheezes, crackles, rhonchi. No retractions. No resp. distress. No accessory muscle use. ABD: S, NT, ND. No rebound. No HSM. EXTR: No c/c/e NEURO Normal gait.  PSYCH: Normally interactive. Conversant. Not depressed or anxious appearing.  Calm demeanor.  Left leg: area of recent superficial skin surgery over lateral shin.  There is a crusty eschar, and the surrounding tissue is slightly red and warm  Assessment and Plan: Other polyneuropathy - Plan: gabapentin (NEURONTIN) 300 MG capsule  Spinal stenosis at L4-L5 level - Plan: gabapentin (NEURONTIN) 300 MG capsule, traMADol (ULTRAM) 50 MG tablet  Cellulitis of left leg - Plan: bacitracin 500 UNIT/GM ointment  Here today as a new patient to establish care Refilled her medications for spinal stenosis.  Also decided to increase her gabapentin to TID in hopes of better controlling her neuropathy pain Will try a topical antibiotic on her leg If this is not helpful she will alert me  Plan to visit in about 3 months   Signed Lamar Blinks, MD

## 2017-04-30 NOTE — Patient Instructions (Addendum)
It was nice to see you today!  Let's try incresing your neurontin to 300 mg three times a day- this may help with your foot pain. We can go up on this dose further if need be I also refilled your tramadol today which you can continue to use for pain  Please apply the antibiotic ointment to your leg lesion twice a day for 7- 10 days.  Please alert me if this does not seem to get his area to heal- Sooner if worse.    Please see me in about 3 months to follow-up/ check labs

## 2017-05-05 ENCOUNTER — Telehealth: Payer: Self-pay | Admitting: Family Medicine

## 2017-05-05 DIAGNOSIS — Z1231 Encounter for screening mammogram for malignant neoplasm of breast: Secondary | ICD-10-CM

## 2017-05-05 NOTE — Telephone Encounter (Signed)
Pt called in she said that at her visit she and PCP discussed a dermatologist referral and having a mammogram. Not showing a referral or mammogram orders in system. Please assist further.   Pt would like to know once referral and orders are placed.   CB: [id number]

## 2017-05-07 DIAGNOSIS — Z8551 Personal history of malignant neoplasm of bladder: Secondary | ICD-10-CM | POA: Diagnosis not present

## 2017-05-08 DIAGNOSIS — Z8551 Personal history of malignant neoplasm of bladder: Secondary | ICD-10-CM | POA: Diagnosis not present

## 2017-05-11 NOTE — Telephone Encounter (Signed)
Calling checking status of referral, please advise

## 2017-05-11 NOTE — Telephone Encounter (Signed)
Mammogram ordered

## 2017-05-12 ENCOUNTER — Encounter: Payer: Self-pay | Admitting: Family Medicine

## 2017-05-12 ENCOUNTER — Ambulatory Visit (INDEPENDENT_AMBULATORY_CARE_PROVIDER_SITE_OTHER): Payer: Medicare Other | Admitting: Family Medicine

## 2017-05-12 VITALS — BP 128/78 | HR 58 | Temp 98.1°F | Resp 14 | Ht 65.5 in | Wt 175.0 lb

## 2017-05-12 DIAGNOSIS — H02834 Dermatochalasis of left upper eyelid: Secondary | ICD-10-CM | POA: Diagnosis not present

## 2017-05-12 DIAGNOSIS — L03116 Cellulitis of left lower limb: Secondary | ICD-10-CM | POA: Diagnosis not present

## 2017-05-12 DIAGNOSIS — Z8589 Personal history of malignant neoplasm of other organs and systems: Secondary | ICD-10-CM

## 2017-05-12 DIAGNOSIS — H524 Presbyopia: Secondary | ICD-10-CM | POA: Diagnosis not present

## 2017-05-12 DIAGNOSIS — H52203 Unspecified astigmatism, bilateral: Secondary | ICD-10-CM | POA: Diagnosis not present

## 2017-05-12 DIAGNOSIS — H02831 Dermatochalasis of right upper eyelid: Secondary | ICD-10-CM | POA: Diagnosis not present

## 2017-05-12 MED ORDER — DOXYCYCLINE HYCLATE 100 MG PO TABS: 100.0000 mg | ORAL_TABLET | Freq: Two times a day (BID) | ORAL | 0 refills | Status: DC

## 2017-05-12 NOTE — Telephone Encounter (Addendum)
Patient checking on the status of dermatology referral patient states she is very upset she has not received a call, patient scheduled follow up appointment today regarding the lesion on her leg patient states she wants to ensure leg is not infected requesting referral to dermatology. Informed patient mamo order has been placed and patient advised PCP is out of the office

## 2017-05-12 NOTE — Progress Notes (Signed)
Patient ID: Mary Buck, female    DOB: Nov 15, 1935  Age: 81 y.o. MRN: [id number]    Subjective:  Subjective  HPI Mary Buck presents for lesion on L low leg-- she went to the derm and Squamous cell was bx---  Few weeks ago she saw Dr copeland and bacitracin was rx.  She is here today because it does not seem to be improving   Review of Systems  Constitutional: Negative for appetite change, diaphoresis, fatigue and unexpected weight change.  Eyes: Negative for pain, redness and visual disturbance.  Respiratory: Negative for cough, chest tightness, shortness of breath and wheezing.   Cardiovascular: Negative for chest pain, palpitations and leg swelling.  Endocrine: Negative for cold intolerance, heat intolerance, polydipsia, polyphagia and polyuria.  Genitourinary: Negative for difficulty urinating, dysuria and frequency.  Skin: Positive for wound.  Neurological: Negative for dizziness, light-headedness, numbness and headaches.    History Past Medical History:  Diagnosis Date  . Allergy   . Asthma   . Cancer ([place])   . Cataract   . Depression   . Heart murmur   . Hyperlipidemia   . Peripheral neuropathy 04/30/2017  . Thyroid disease     She has a past surgical history that includes Carpal tunnel release (2015); bladder cancer (2013); Cholecystectomy; and Tonsillectomy and adenoidectomy (1943).   Her family history includes Hyperlipidemia in her father.She reports that she has never smoked. She has never used smokeless tobacco. She reports that she does not drink alcohol or use drugs.  Current Outpatient Prescriptions on File Prior to Visit  Medication Sig Dispense Refill  . bacitracin 500 UNIT/GM ointment Apply 1 application topically 2 (two) times daily. 15 g 0  . Calcium Carbonate-Vitamin D (LIQUID CALCIUM/VITAMIN D PO) Take by mouth.    . gabapentin (NEURONTIN) 300 MG capsule Take 1 capsule (300 mg total) by mouth 3 (three) times daily. 90 capsule 4  . lactobacillus  acidophilus (BACID) TABS tablet Take 1 tablet by mouth daily.     . lansoprazole (PREVACID) 30 MG capsule Take 30 mg by mouth daily at 12 noon.    Marland Kitchen levothyroxine (SYNTHROID, LEVOTHROID) 88 MCG tablet Take 88 mcg by mouth daily before breakfast.    . simvastatin (ZOCOR) 20 MG tablet Take 20 mg by mouth daily.    . traMADol (ULTRAM) 50 MG tablet Take 1 tablet (50 mg total) by mouth 2 (two) times daily. 60 tablet 2   No current facility-administered medications on file prior to visit.      Objective:  Objective  Physical Exam  Constitutional: She is oriented to person, place, and time. She appears well-developed and well-nourished.  HENT:  Head: Normocephalic and atraumatic.  Eyes: Conjunctivae and EOM are normal.  Neck: Normal range of motion. Neck supple. No JVD present. Carotid bruit is not present. No thyromegaly present.  Cardiovascular: Normal rate, regular rhythm and normal heart sounds.   No murmur heard. Pulmonary/Chest: Effort normal and breath sounds normal. No respiratory distress. She has no wheezes. She has no rales. She exhibits no tenderness.  Musculoskeletal: She exhibits no edema.  Neurological: She is alert and oriented to person, place, and time.  Skin: There is erythema.     Psychiatric: She has a normal mood and affect.  Nursing note and vitals reviewed.  BP 128/78 (BP Location: Left Arm, Patient Position: Sitting, Cuff Size: Normal)   Pulse (!) 58   Temp 98.1 F (36.7 C) (Oral)   Resp 14   Ht 5' 5.5" (1.664  m)   Wt 175 lb (79.4 kg)   SpO2 97%   BMI 28.68 kg/m  Wt Readings from Last 3 Encounters:  05/12/17 175 lb (79.4 kg)  04/30/17 172 lb 6.4 oz (78.2 kg)     No results found for: WBC, HGB, HCT, PLT, GLUCOSE, CHOL, TRIG, HDL, LDLDIRECT, LDLCALC, ALT, AST, NA, K, CL, CREATININE, BUN, CO2, TSH, PSA, INR, GLUF, HGBA1C, MICROALBUR  Patient was never admitted.   Assessment & Plan:  Plan  I am having Ms. Mccoin start on doxycycline. I am also having  her maintain her lansoprazole, levothyroxine, simvastatin, lactobacillus acidophilus, Calcium Carbonate-Vitamin D (LIQUID CALCIUM/VITAMIN D PO), bacitracin, gabapentin, and traMADol.  Meds ordered this encounter  Medications  . doxycycline (VIBRA-TABS) 100 MG tablet    Sig: Take 1 tablet (100 mg total) by mouth 2 (two) times daily.    Dispense:  20 tablet    Refill:  0    Problem List Items Addressed This Visit    None    Visit Diagnoses    Cellulitis of left lower extremity    -  Primary   Relevant Medications   doxycycline (VIBRA-TABS) 100 MG tablet   Other Relevant Orders   Ambulatory referral to Dermatology   History of squamous cell carcinoma       Relevant Orders   Ambulatory referral to Dermatology      Follow-up: Return if symptoms worsen or fail to improve.  Ann Held, DO

## 2017-05-12 NOTE — Progress Notes (Signed)
Pre visit review using our clinic review tool, if applicable. No additional management support is needed unless otherwise documented below in the visit note. 

## 2017-05-12 NOTE — Telephone Encounter (Signed)
Pt requested a different derm

## 2017-05-12 NOTE — Telephone Encounter (Signed)
She is already seeing dermatology- they treated her skin cancer.  I was not aware that she wanted me to place another referral to dermatology?  Also did not know that she wanted a mammogram referral, Vilma Prader thank for taking care of this.   Kendrick Fries it looks like she saw you today, thank for seeing her.   Does she want to see another dermatologist? JC

## 2017-05-12 NOTE — Patient Instructions (Signed)

## 2017-05-12 NOTE — Telephone Encounter (Signed)
Referral was placed for different derm group at todays visit.

## 2017-05-20 DIAGNOSIS — D485 Neoplasm of uncertain behavior of skin: Secondary | ICD-10-CM | POA: Diagnosis not present

## 2017-05-20 DIAGNOSIS — C44729 Squamous cell carcinoma of skin of left lower limb, including hip: Secondary | ICD-10-CM | POA: Diagnosis not present

## 2017-05-21 ENCOUNTER — Encounter (HOSPITAL_BASED_OUTPATIENT_CLINIC_OR_DEPARTMENT_OTHER): Payer: Self-pay

## 2017-05-21 ENCOUNTER — Ambulatory Visit (HOSPITAL_BASED_OUTPATIENT_CLINIC_OR_DEPARTMENT_OTHER)
Admission: RE | Admit: 2017-05-21 | Discharge: 2017-05-21 | Disposition: A | Discharge status: Home / Self Care | Payer: Medicare Other | Source: Ambulatory Visit | Attending: Family Medicine | Admitting: Family Medicine

## 2017-05-21 DIAGNOSIS — Z1231 Encounter for screening mammogram for malignant neoplasm of breast: Secondary | ICD-10-CM | POA: Diagnosis not present

## 2017-05-26 ENCOUNTER — Telehealth: Payer: Self-pay | Admitting: Family Medicine

## 2017-05-26 MED ORDER — SIMVASTATIN 20 MG PO TABS: 20.0000 mg | ORAL_TABLET | Freq: Every day | ORAL | 3 refills | Status: DC

## 2017-05-26 MED ORDER — LEVOTHYROXINE SODIUM 88 MCG PO TABS: 88.0000 ug | ORAL_TABLET | Freq: Every day | ORAL | 3 refills | Status: DC

## 2017-05-26 NOTE — Telephone Encounter (Signed)
°  Relation to YS:HUOH Call back [place], [place] - [address] [phone] (Phone) [phone] (Fax)     Reason for call:  Patient requesting 90 day supply for both medications levothyroxine (SYNTHROID, LEVOTHROID) 88 MCG tablet and simvastatin (ZOCOR) 20 MG tablet

## 2017-05-27 ENCOUNTER — Other Ambulatory Visit: Payer: Self-pay | Admitting: Family Medicine

## 2017-05-27 ENCOUNTER — Telehealth: Payer: Self-pay | Admitting: *Deleted

## 2017-05-27 NOTE — Telephone Encounter (Signed)
Relation to pt: self Call back [place], [place] - [address] [phone] ([phone] (Fax)     Reason for call:  Patient requesting a 90 day supply lansoprazole (PREVACID) 30 MG capsule, patient states she sent request in on Monday and shes going out of town, please advise

## 2017-05-27 NOTE — Telephone Encounter (Signed)
Received results from [facility], [place]; forwarded to provider/SLS 09/12

## 2017-05-28 DIAGNOSIS — L988 Other specified disorders of the skin and subcutaneous tissue: Secondary | ICD-10-CM | POA: Diagnosis not present

## 2017-05-28 DIAGNOSIS — D485 Neoplasm of uncertain behavior of skin: Secondary | ICD-10-CM | POA: Diagnosis not present

## 2017-05-28 DIAGNOSIS — C44729 Squamous cell carcinoma of skin of left lower limb, including hip: Secondary | ICD-10-CM | POA: Diagnosis not present

## 2017-05-28 DIAGNOSIS — Z85828 Personal history of other malignant neoplasm of skin: Secondary | ICD-10-CM | POA: Diagnosis not present

## 2017-05-28 MED ORDER — LANSOPRAZOLE 30 MG PO CPDR: 30.0000 mg | DELAYED_RELEASE_CAPSULE | Freq: Every day | ORAL | 0 refills | Status: DC

## 2017-05-28 NOTE — Telephone Encounter (Signed)
Medication refilled

## 2017-06-01 DIAGNOSIS — M797 Fibromyalgia: Secondary | ICD-10-CM | POA: Diagnosis not present

## 2017-06-01 DIAGNOSIS — M5136 Other intervertebral disc degeneration, lumbar region: Secondary | ICD-10-CM

## 2017-06-01 DIAGNOSIS — M51369 Other intervertebral disc degeneration, lumbar region without mention of lumbar back pain or lower extremity pain: Secondary | ICD-10-CM

## 2017-06-01 DIAGNOSIS — M48062 Spinal stenosis, lumbar region with neurogenic claudication: Secondary | ICD-10-CM | POA: Diagnosis not present

## 2017-06-01 HISTORY — DX: Other intervertebral disc degeneration, lumbar region: M51.36

## 2017-06-01 HISTORY — DX: Other intervertebral disc degeneration, lumbar region without mention of lumbar back pain or lower extremity pain: M51.369

## 2017-06-02 ENCOUNTER — Telehealth: Payer: Self-pay | Admitting: *Deleted

## 2017-06-02 NOTE — Telephone Encounter (Signed)
Received Dermatopathology results report from [facility]  forwarded to provider/SLS 09/18

## 2017-06-14 DIAGNOSIS — Z23 Encounter for immunization: Secondary | ICD-10-CM | POA: Diagnosis not present

## 2017-06-23 DIAGNOSIS — G4710 Hypersomnia, unspecified: Secondary | ICD-10-CM | POA: Diagnosis not present

## 2017-06-23 DIAGNOSIS — Z1211 Encounter for screening for malignant neoplasm of colon: Secondary | ICD-10-CM | POA: Diagnosis not present

## 2017-06-23 DIAGNOSIS — R1310 Dysphagia, unspecified: Secondary | ICD-10-CM | POA: Diagnosis not present

## 2017-06-23 DIAGNOSIS — K219 Gastro-esophageal reflux disease without esophagitis: Secondary | ICD-10-CM | POA: Diagnosis not present

## 2017-06-23 DIAGNOSIS — R070 Pain in throat: Secondary | ICD-10-CM | POA: Diagnosis not present

## 2017-07-07 ENCOUNTER — Ambulatory Visit (INDEPENDENT_AMBULATORY_CARE_PROVIDER_SITE_OTHER): Payer: Medicare Other | Admitting: Podiatry

## 2017-07-07 ENCOUNTER — Encounter: Payer: Self-pay | Admitting: Podiatry

## 2017-07-07 DIAGNOSIS — M722 Plantar fascial fibromatosis: Secondary | ICD-10-CM | POA: Insufficient documentation

## 2017-07-07 NOTE — Progress Notes (Signed)
   Subjective:    Patient ID: Mary Buck, female    DOB: August 18, 1936, 81 y.o.   MRN: [id number]  HPI   I have some heel pain and hurts more on the right heel and is sore when walking and I am not a diabetic     Review of Systems  All other systems reviewed and are negative.      Objective:   Physical Exam        Assessment & Plan:

## 2017-07-07 NOTE — Progress Notes (Signed)
Subjective:    Patient ID: Mary Buck, female   DOB: 81 y.o.   MRN: [id number]   HPI 81 year old female presents the office today for concerns of bilateral heel pain which has been ongoing for several months. She denies any recent injury or trauma to the area. She has no numbness or tingling. If he does not wake up at night. She gets pain in the morning she first gets up that she's been on her feet all day.  She states that she would hold off on any steroids and she cannot any anti-inflammatories as she has upcoming eye surgery.   Review of Systems  All other systems reviewed and are negative.  Past Medical History:  Diagnosis Date  . Allergy   . Asthma   . Cancer ([place])   . Cataract   . DDD (degenerative disc disease), lumbar 06/01/2017  . Depression   . Heart murmur   . Hyperlipidemia   . Peripheral neuropathy 04/30/2017  . Thyroid disease     Past Surgical History:  Procedure Laterality Date  . bladder cancer  2013  . CARPAL TUNNEL RELEASE  2015  . CHOLECYSTECTOMY    . TONSILLECTOMY AND ADENOIDECTOMY  1943     Current Outpatient Prescriptions:  .  bacitracin 500 UNIT/GM ointment, Apply 1 application topically 2 (two) times daily., Disp: 15 g, Rfl: 0 .  Calcium Carbonate-Vitamin D (LIQUID CALCIUM/VITAMIN D PO), Take by mouth., Disp: , Rfl:  .  doxycycline (VIBRA-TABS) 100 MG tablet, Take 1 tablet (100 mg total) by mouth 2 (two) times daily., Disp: 20 tablet, Rfl: 0 .  gabapentin (NEURONTIN) 300 MG capsule, Take 1 capsule (300 mg total) by mouth 3 (three) times daily., Disp: 90 capsule, Rfl: 4 .  lactobacillus acidophilus (BACID) TABS tablet, Take 1 tablet by mouth daily. , Disp: , Rfl:  .  lansoprazole (PREVACID) 30 MG capsule, Take 1 capsule (30 mg total) by mouth daily at 12 noon., Disp: 90 capsule, Rfl: 0 .  levothyroxine (SYNTHROID, LEVOTHROID) 88 MCG tablet, Take 1 tablet (88 mcg total) by mouth daily before breakfast., Disp: 90 tablet, Rfl: 3 .  simvastatin (ZOCOR)  20 MG tablet, Take 1 tablet (20 mg total) by mouth daily., Disp: 90 tablet, Rfl: 3 .  traMADol (ULTRAM) 50 MG tablet, Take 1 tablet (50 mg total) by mouth 2 (two) times daily., Disp: 60 tablet, Rfl: 2  Allergies  Allergen Reactions  . Cephalexin     Other reaction(s): unsure  . Contrast Media [Iodinated Diagnostic Agents]   . Latex     Other reaction(s): blistering, redness   . Mirtazapine     Other reaction(s): nausea  . Penicillins   . Sulfa Antibiotics     Social History   Social History  . Marital status: Significant Other    Spouse name: N/A  . Number of children: N/A  . Years of education: N/A   Occupational History  . Not on file.   Social History Main Topics  . Smoking status: Never Smoker  . Smokeless tobacco: Never Used  . Alcohol use No  . Drug use: No  . Sexual activity: Not on file   Other Topics Concern  . Not on file   Social History Narrative  . No narrative on file      Objective:  Physical Exam General: AAO x3, NAD  Dermatological: Skin is warm, dry and supple bilateral. Nails x 10 are well manicured; remaining integument appears unremarkable at this time. There are no  open sores, no preulcerative lesions, no rash or signs of infection present.  Bandage intact to the left lower leg just above the ankle. This is from a healing wound she states that she is under the care of another doctor for this.  Vascular: Dorsalis Pedis artery and Posterior Tibial artery pedal pulses are 2/4 bilateral with immedate capillary fill time.There is no pain with calf compression, swelling, warmth, erythema. Varicose veins present bilaterally  Neruologic: Grossly intact via light touch bilateral.Protective threshold with Semmes Wienstein monofilament intact to all pedal sites bilateral. Negative tinel sign.   Musculoskeletal: Tenderness to palpation along the plantar medial tubercle of the calcaneus at the insertion of plantar fascia on the right >> left foot. There  is no pain along the course of the plantar fascia within the arch of the foot. Plantar fascia appears to be intact. There is no pain with lateral compression of the calcaneus or pain with vibratory sensation. There is no pain along the course or insertion of the achilles tendon. No other areas of tenderness to bilateral lower extremities. Muscular strength 5/5 in all groups tested bilateral.  Gait: Unassisted, Nonantalgic.      Assessment:     Bilateral heel pain, likely plantar fasciitis    Plan:     -Treatment options discussed including all alternatives, risks, and complications -Etiology of symptoms were discussed -Declined steroid injection, oral steroids, anti-inflammatories -Stretching, icing exercises daily -Discussed shoe gear medications and orthotics. She is concerned about a custom insert and she wants to start an over-the-counter one. -Plantar fascial braces were dispensed bilaterally -Follow up in 6 weeks or sooner if needed. Call any questions or concerns meantime.  Celesta Gentile, DPM

## 2017-07-07 NOTE — Patient Instructions (Signed)

## 2017-07-09 DIAGNOSIS — H02831 Dermatochalasis of right upper eyelid: Secondary | ICD-10-CM | POA: Diagnosis not present

## 2017-07-09 DIAGNOSIS — H02834 Dermatochalasis of left upper eyelid: Secondary | ICD-10-CM | POA: Diagnosis not present

## 2017-07-21 DIAGNOSIS — C44729 Squamous cell carcinoma of skin of left lower limb, including hip: Secondary | ICD-10-CM | POA: Diagnosis not present

## 2017-07-21 DIAGNOSIS — Z85828 Personal history of other malignant neoplasm of skin: Secondary | ICD-10-CM | POA: Diagnosis not present

## 2017-07-21 DIAGNOSIS — D485 Neoplasm of uncertain behavior of skin: Secondary | ICD-10-CM | POA: Diagnosis not present

## 2017-07-21 DIAGNOSIS — L400 Psoriasis vulgaris: Secondary | ICD-10-CM | POA: Diagnosis not present

## 2017-07-21 DIAGNOSIS — D0472 Carcinoma in situ of skin of left lower limb, including hip: Secondary | ICD-10-CM | POA: Diagnosis not present

## 2017-08-02 NOTE — Progress Notes (Signed)
[place] at [facility] [address] [place], [place] [address] [phone]  Date:  08/03/2017   Name:  Mary Buck   DOB:  [birth year]   MRN:  [id number]  PCP:  Darreld Mclean, MD    Chief Complaint: No chief complaint on file.   History of Present Illness:  Mary Buck is a 81 y.o. very pleasant female patient who presents with the following:  From our last visit in August:  I also take care of her partner Carmie Kanner She had been a pt of [facility] - it looks like she just established care with them in May of this year Per that visit: Chronic Low back pain: Patient admits to "issues with disc 4 and disc 5". Patient's back pain is currently managed with Tramadol and Gabapentin, she was previously managed by Pain Management. Patient would like a new pain management referral at this time.   Hypothyroidism: Patient is currently taking Levothyroxine 88 mcg PO daily without complication. Patient admits to fatigue  Blurred Vision: Chronic with acute worsening, patient currently wears OTC reading glasses. Patient would like to be referred to Ophthalmologist at this time for further evaluation. Patient does have history of bilateral cataract removal in 2014 or 2015. Patient admits to "feeling very sleepy when I try to read".   She does have lumbar stenosis She had bladder cancer in 2013- this has been clear for the last 4 years.  She has a urology appt coming up here in [place] for her annual follow-up visit  She just moved to this area in may of this year- had been in [place], then to [place] for a year, and then to [place].    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 Lipids and other labs She is not fasting- she had cereal and milk.  Will go ahead and order for her today  She is using mupirocin for sores in her nose-  She saw her DDS in [place] in October-  She needs a local dentist to help her with implants.  She was given some sort of antibiotic to apply topically but she cannot get the dispenser to work right now.   She is using clobetasol for blisters on her right foot  We started her on Neurontin earlier this year and it does seem to help with her peripheral neuropathy  She did see Dr. Francena Hanly- her GI doctor from [place]; she gets hoarse every time she eats.  She was recommended to see an ENT doctor about this. She is on 30 mg of prevacid daily He sees Wilhemina Bonito for dermatology-  He recetnly did a repeat bx on her left ankle.  It seems that they are planning a Mohs surgery for her  She needs more tramadol but not just yet  She is doing chair yoga twice a week- she is bothered by poor balance which has been present for some time. No issues with driving. She has not fallen, but notes that she will tend to drift to the left when she is walking.  She may also lean forward  She did do some PT while she was in [place] for her balance- this did help for a while.  She does not have any tremor   She has been on simvastatin for a long time- many years  She notes that she is eating a bit less as she ages and she is losing a few lbs which she is happy about  Wt Readings from Last 3 Encounters:  08/03/17 173 lb (78.5 kg)  05/12/17 175 lb (79.4 kg)  04/30/17 172 lb 6.4 oz (78.2 kg)   She is a former smoker Patient Active Problem List   Diagnosis Date Noted  . Plantar fasciitis 07/07/2017  . DDD (degenerative disc disease), lumbar 06/01/2017  . Fibromyalgia 06/01/2017  . Spinal stenosis at L4-L5 level 04/30/2017  . Peripheral neuropathy 04/30/2017  . Encounter for  medication monitoring 03/05/2017  . Bladder cancer ([place]) 08/20/2015  . Cervical stenosis of spinal canal 07/27/2015  . Spinal stenosis of cervical region 07/27/2015  . Spondylolisthesis 07/27/2015  . Hypothyroid 07/05/2015  . Abdominal pain, acute, left upper quadrant 06/28/2015  . Basal cell carcinoma, face 06/28/2015  . Bilateral groin pain 06/28/2015  . Chronic pain 06/28/2015  . Fatigue 06/28/2015  . Hyperlipidemia 06/28/2015  . Pain in joints 06/28/2015  . Paresthesia of lower lip 06/28/2015  . Pulmonary nodule 06/28/2015  . Pustulosis palmaris et plantaris 06/28/2015  . Ulcer of nose 06/28/2015  . 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  . Mass of parotid gland 03/24/2014    Past Medical History:  Diagnosis Date  . Allergy   . Asthma   . Cancer ([place])   . Cataract   . DDD (degenerative disc disease), lumbar 06/01/2017  . Depression   . Heart murmur   . Hyperlipidemia   . Peripheral neuropathy 04/30/2017  . Thyroid disease     Past Surgical History:  Procedure Laterality Date  . bladder cancer  2013  . CARPAL TUNNEL RELEASE  2015  . CHOLECYSTECTOMY    . TONSILLECTOMY AND ADENOIDECTOMY  1943    Social History   Tobacco Use  . Smoking status: Never Smoker  . Smokeless tobacco: Never Used  Substance Use Topics  . Alcohol use: No  . Drug use: No    Family History  Problem Relation Age of Onset  . Hyperlipidemia Father     Allergies  Allergen Reactions  . Cephalexin     Other reaction(s): unsure  . Contrast Media [Iodinated Diagnostic Agents]   . Latex     Other reaction(s): blistering, redness   . Mirtazapine     Other reaction(s): nausea  . Penicillins   . Sulfa Antibiotics     Medication list has been reviewed and updated.  Current Outpatient Medications on File Prior to Visit  Medication Sig Dispense Refill  . Calcium Carbonate-Vitamin D (LIQUID CALCIUM/VITAMIN D PO) Take by mouth.    . gabapentin (NEURONTIN) 300 MG capsule Take 1 capsule (300 mg total)  by mouth 3 (three) times daily. 90 capsule 4  . lactobacillus acidophilus (BACID) TABS tablet Take 1 tablet by mouth daily.     . lansoprazole (PREVACID) 30 MG capsule Take 1 capsule (30 mg total) by mouth daily at 12 noon. 90 capsule 0  . levothyroxine (SYNTHROID, LEVOTHROID) 88 MCG tablet Take 1 tablet (88 mcg total) by mouth daily before breakfast. 90 tablet 3  . simvastatin (ZOCOR) 20 MG tablet Take 1 tablet (20 mg total) by mouth daily. 90 tablet 3  .  traMADol (ULTRAM) 50 MG tablet Take 1 tablet (50 mg total) by mouth 2 (two) times daily. 60 tablet 2   No current facility-administered medications on file prior to visit.     Review of Systems:  As per HPI- otherwise negative. No fever or chills No CP or SOB   Physical Examination: There were no vitals filed for this visit. Vitals:   08/03/17 1237  Weight: 173 lb (78.5 kg)   Body mass index is 28.35 kg/m. Ideal Body Weight:    GEN: WDWN, NAD, Non-toxic, A & O x 3, overweight, looks well HEENT: Atraumatic, Normocephalic. Neck supple. No masses, No LAD.  Bilateral TM wnl, oropharynx normal.  PEERL,EOMI.   Ears and Nose: No external deformity. CV: RRR, No M/G/R. No JVD. No thrill. No extra heart sounds. PULM: CTA B, no wheezes, crackles, rhonchi. No retractions. No resp. distress. No accessory muscle use. ABD: S, NT, ND, +BS. No rebound. No HSM. EXTR: No c/c/e NEURO Normal gait.  Normal strength and DTR of all extremities.  Negative romberg.  She is more unsteady on tandem gait testing but is able to do it  PSYCH: Normally interactive. Conversant. Not depressed or anxious appearing.  Calm demeanor.    Assessment and Plan: Acquired hypothyroidism - Plan: CBC, Comprehensive metabolic panel, TSH  Spinal stenosis at L4-L5 level  Other polyneuropathy  Dyslipidemia - Plan: Comprehensive metabolic panel, Lipid panel  Hoarse voice quality - Plan: Ambulatory referral to ENT  Gastroesophageal reflux disease, esophagitis  presence not specified - Plan: lansoprazole (PREVACID) 30 MG capsule  Medication monitoring encounter - Plan: CBC, Comprehensive metabolic panel  \follow--up visit today Refilled her prevacid and referred her to ENT She does not need any more pain medication today Discussed her concerns about balance and gait.  Offered PT vs a neurology referral.  For the time being she wishes to continue her new yoga routine but will let me know if this is not helpful for her  Signed Lamar Blinks, MD  Results for orders placed or performed in visit on 08/03/17  CBC  Result Value Ref Range   WBC 5.9 4.0 - 10.5 K/uL   RBC 4.03 3.87 - 5.11 Mil/uL   Platelets 237.0 150.0 - 400.0 K/uL   Hemoglobin 12.2 12.0 - 15.0 g/dL   HCT 37.0 36.0 - 46.0 %   MCV 91.7 78.0 - 100.0 fl   MCHC 33.0 30.0 - 36.0 g/dL   RDW 15.1 11.5 - 15.5 %  Comprehensive metabolic panel  Result Value Ref Range   Sodium 142 135 - 145 mEq/L   Potassium 4.1 3.5 - 5.1 mEq/L   Chloride 106 96 - 112 mEq/L   CO2 31 19 - 32 mEq/L   Glucose, Bld 98 70 - 99 mg/dL   BUN 14 6 - 23 mg/dL   Creatinine, Ser 0.86 0.40 - 1.20 mg/dL   Total Bilirubin 0.4 0.2 - 1.2 mg/dL   Alkaline Phosphatase 74 39 - 117 U/L   AST 20 0 - 37 U/L   ALT 16 0 - 35 U/L   Total Protein 6.7 6.0 - 8.3 g/dL   Albumin 3.7 3.5 - 5.2 g/dL   Calcium 9.2 8.4 - 10.5 mg/dL   GFR 67.67 >60.00 mL/min  Lipid panel  Result Value Ref Range   Cholesterol 163 0 - 200 mg/dL   Triglycerides 165.0 (H) 0.0 - 149.0 mg/dL   HDL 54.20 >39.00 mg/dL   VLDL 33.0 0.0 - 40.0 mg/dL   LDL Cholesterol 76  0 - 99 mg/dL   Total CHOL/HDL Ratio 3    NonHDL 108.84   TSH  Result Value Ref Range   TSH 0.91 0.35 - 4.50 uIU/mL   Received her labs- letter to pt

## 2017-08-03 ENCOUNTER — Ambulatory Visit (INDEPENDENT_AMBULATORY_CARE_PROVIDER_SITE_OTHER): Payer: Medicare Other | Admitting: Family Medicine

## 2017-08-03 ENCOUNTER — Encounter: Payer: Self-pay | Admitting: Family Medicine

## 2017-08-03 VITALS — BP 120/82 | HR 67 | Wt 173.0 lb

## 2017-08-03 DIAGNOSIS — G6289 Other specified polyneuropathies: Secondary | ICD-10-CM | POA: Diagnosis not present

## 2017-08-03 DIAGNOSIS — Z5181 Encounter for therapeutic drug level monitoring: Secondary | ICD-10-CM

## 2017-08-03 DIAGNOSIS — K219 Gastro-esophageal reflux disease without esophagitis: Secondary | ICD-10-CM | POA: Diagnosis not present

## 2017-08-03 DIAGNOSIS — E039 Hypothyroidism, unspecified: Secondary | ICD-10-CM | POA: Diagnosis not present

## 2017-08-03 DIAGNOSIS — R490 Dysphonia: Secondary | ICD-10-CM

## 2017-08-03 DIAGNOSIS — M48061 Spinal stenosis, lumbar region without neurogenic claudication: Secondary | ICD-10-CM

## 2017-08-03 DIAGNOSIS — E785 Hyperlipidemia, unspecified: Secondary | ICD-10-CM | POA: Diagnosis not present

## 2017-08-03 LAB — COMPREHENSIVE METABOLIC PANEL
ALK PHOS: 74 U/L (ref 39–117)
ALT: 16 U/L (ref 0–35)
AST: 20 U/L (ref 0–37)
Albumin: 3.7 g/dL (ref 3.5–5.2)
BILIRUBIN TOTAL: 0.4 mg/dL (ref 0.2–1.2)
BUN: 14 mg/dL (ref 6–23)
CO2: 31 mEq/L (ref 19–32)
CREATININE: 0.86 mg/dL (ref 0.40–1.20)
Calcium: 9.2 mg/dL (ref 8.4–10.5)
Chloride: 106 mEq/L (ref 96–112)
GFR: 67.67 mL/min (ref >60.00)
Glucose, Bld: 98 mg/dL (ref 70–99)
Potassium: 4.1 mEq/L (ref 3.5–5.1)
SODIUM: 142 meq/L (ref 135–145)
Total Protein: 6.7 g/dL (ref 6.0–8.3)

## 2017-08-03 LAB — CBC
HCT: 37 % (ref 36.0–46.0)
Hemoglobin: 12.2 g/dL (ref 12.0–15.0)
MCHC: 33 g/dL (ref 30.0–36.0)
MCV: 91.7 fl (ref 78.0–100.0)
Platelets: 237 10*3/uL (ref 150.0–400.0)
RBC: 4.03 Mil/uL (ref 3.87–5.11)
RDW: 15.1 % (ref 11.5–15.5)
WBC: 5.9 10*3/uL (ref 4.0–10.5)

## 2017-08-03 LAB — LIPID PANEL
Cholesterol: 163 mg/dL (ref 0–200)
HDL: 54.2 mg/dL (ref >39.00)
LDL Cholesterol: 76 mg/dL (ref 0–99)
NonHDL: 108.84
Total CHOL/HDL Ratio: 3
Triglycerides: 165 mg/dL — ABNORMAL HIGH (ref 0.0–149.0)
VLDL: 33 mg/dL (ref 0.0–40.0)

## 2017-08-03 LAB — TSH: TSH: 0.91 u[IU]/mL (ref 0.35–4.50)

## 2017-08-03 MED ORDER — LANSOPRAZOLE 30 MG PO CPDR: 30.0000 mg | DELAYED_RELEASE_CAPSULE | Freq: Every day | ORAL | 3 refills | Status: DC

## 2017-08-03 NOTE — Patient Instructions (Signed)
It was nice to see you again today- Happy Thanksgiving! I will be in touch with your labs, and referred you to ENT  Please let me know if your balance does not seem to be improving with your yoga classes

## 2017-08-04 ENCOUNTER — Ambulatory Visit (INDEPENDENT_AMBULATORY_CARE_PROVIDER_SITE_OTHER): Payer: Medicare Other | Admitting: Podiatry

## 2017-08-04 DIAGNOSIS — M2041 Other hammer toe(s) (acquired), right foot: Secondary | ICD-10-CM

## 2017-08-04 DIAGNOSIS — L84 Corns and callosities: Secondary | ICD-10-CM | POA: Diagnosis not present

## 2017-08-04 DIAGNOSIS — M2042 Other hammer toe(s) (acquired), left foot: Secondary | ICD-10-CM

## 2017-08-04 DIAGNOSIS — M722 Plantar fascial fibromatosis: Secondary | ICD-10-CM | POA: Diagnosis not present

## 2017-08-04 NOTE — Progress Notes (Signed)
Subjective: 81 year old female presents the office today for follow-up evaluation of bilateral heel pain, plantar fasciitis.  She states that her heels are doing much better she is been stretching, icing as well as using a plantar fascial braces.  She states that she is having some hard areas of skin on her toes which are new.  Denies any open sores or drainage.  She did have skin cancer areas removed to her left leg and there is still some residual cancer cells she is can be undergoing Mohs surgery.  She has no other concerns today. Denies any systemic complaints such as fevers, chills, nausea, vomiting. No acute changes since last appointment, and no other complaints at this time.   Objective: AAO x3, NAD DP/PT pulses palpable bilaterally, CRT less than 3 seconds There is no significant discomfort on the plantar medial tubercle of the calcaneus at the insertion of plantar fascia.  The plantar fascia appears to be intact.  Achilles tendon intact.  There is no overlying edema, erythema, increase in warmth.  Hammertoes are present.  Small hyperkeratotic lesions present the dorsal right third PIPJ as well as the left fourth digit and left hallux.  There is no underlying ulceration, drainage or any signs of infection.  On the lateral aspect of the left leg along the area of the previous skin cancer removal there are 2 scabbed areas which remains.  She is under the active care with her physician for this.  No open lesions or pre-ulcerative lesions.  No pain with calf compression, swelling, warmth, erythema  Assessment: Resolving heel pain bilaterally with hammertoes results of hyperkeratotic lesions  Plan: -All treatment options discussed with the patient including all alternatives, risks, complications.  -Regard to the heel pain continue with stretching, icing daily as well as plantar fascial braces.  I recommended inserts to help vent recurrence as well as shoe gear modifications.  She is in a look at  purchasing over-the-counter insert.  She is doing well from see her back as needed for this.  Recommend a small moisturizer to the areas of the dry skin to her feet daily.  Discussed offloading pads of the hammertoes to help prevent from rubbing inside of her shoes. -RTC as needed.  -Patient encouraged to call the office with any questions, concerns, change in symptoms.   Trula Slade DPM

## 2017-08-04 NOTE — Patient Instructions (Signed)
Look at getting a toe crest to help with the hammetoes.  You can go to [employer] to look for an arch support to help with the plantar fasciitis.   Have a great Thanksgiving!

## 2017-08-10 ENCOUNTER — Ambulatory Visit (INDEPENDENT_AMBULATORY_CARE_PROVIDER_SITE_OTHER): Payer: Medicare Other | Admitting: Family Medicine

## 2017-08-10 ENCOUNTER — Encounter: Payer: Self-pay | Admitting: Family Medicine

## 2017-08-10 VITALS — BP 106/70 | HR 82 | Temp 98.6°F | Ht 66.0 in | Wt 174.0 lb

## 2017-08-10 DIAGNOSIS — R05 Cough: Secondary | ICD-10-CM | POA: Diagnosis not present

## 2017-08-10 DIAGNOSIS — J029 Acute pharyngitis, unspecified: Secondary | ICD-10-CM | POA: Diagnosis not present

## 2017-08-10 DIAGNOSIS — R5383 Other fatigue: Secondary | ICD-10-CM

## 2017-08-10 DIAGNOSIS — R059 Cough, unspecified: Secondary | ICD-10-CM

## 2017-08-10 LAB — POCT INFLUENZA A/B
INFLUENZA B, POC: NEGATIVE
Influenza A, POC: NEGATIVE

## 2017-08-10 LAB — POCT RAPID STREP A (OFFICE): Rapid Strep A Screen: NEGATIVE

## 2017-08-10 MED ORDER — BENZONATATE 100 MG PO CAPS: 100.0000 mg | ORAL_CAPSULE | Freq: Three times a day (TID) | ORAL | 0 refills | Status: DC | PRN

## 2017-08-10 MED ORDER — AZITHROMYCIN 250 MG PO TABS: ORAL_TABLET | ORAL | 0 refills | Status: DC

## 2017-08-10 NOTE — Progress Notes (Signed)
[place] at [facility] [address], [place], [place] [address] [phone]  Date:  08/10/2017   Name:  Mary Buck   DOB:  06/18/1936   MRN:  [id number]  PCP:  Darreld Mclean, MD    Chief Complaint: Sore Throat; Cough; and Generalized Body Aches   History of Present Illness:  Mary Buck is a 81 y.o. very pleasant female patient who presents with the following:  Here today with illness We saw her on 11/19 for a routine visit.  She then traveled over the Thanksgiving holiday and seemed to get sick- she noted a severe ST a couple of days ago, and a "croupy cough" over the last couple of days  She has felt hot and cold but has not measured a fever One of her friends was sick with a bad cold over the weekend.    She did get a flu shot in September She did have some diarrhea but no vomiting She is able to eat ok Feels bad but not terrible She does feel tried and like she might want to take a nap   Patient Active Problem List   Diagnosis Date Noted  . Plantar fasciitis 07/07/2017  . DDD (degenerative disc disease), lumbar 06/01/2017  . Fibromyalgia 06/01/2017  . Spinal stenosis at L4-L5 level 04/30/2017  . Peripheral neuropathy 04/30/2017  . Encounter for medication monitoring 03/05/2017  . Bladder cancer ([place]) 08/20/2015  . Cervical stenosis of spinal canal 07/27/2015  . Spinal stenosis of cervical region 07/27/2015  . Spondylolisthesis 07/27/2015  . Hypothyroid 07/05/2015  . Abdominal pain, acute, left upper quadrant 06/28/2015  . Basal cell carcinoma, face 06/28/2015  . Bilateral groin pain 06/28/2015  . Chronic pain 06/28/2015  . Fatigue 06/28/2015  . Hyperlipidemia 06/28/2015  . Pain in joints 06/28/2015  . Paresthesia of lower lip 06/28/2015  . Pulmonary nodule 06/28/2015  . Pustulosis palmaris et plantaris 06/28/2015  . Ulcer of nose 06/28/2015  . 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  . Mass of parotid gland  03/24/2014    Past Medical History:  Diagnosis Date  . Allergy   . Asthma   . Cancer ([place])   . Cataract   . DDD (degenerative disc disease), lumbar 06/01/2017  . Depression   . Heart murmur   . Hyperlipidemia   . Peripheral neuropathy 04/30/2017  . Thyroid disease     Past Surgical History:  Procedure Laterality Date  . bladder cancer  2013  . CARPAL TUNNEL RELEASE  2015  . CHOLECYSTECTOMY    . TONSILLECTOMY AND ADENOIDECTOMY  1943    Social History   Tobacco Use  . Smoking status: Never Smoker  . Smokeless tobacco: Never Used  Substance Use Topics  . Alcohol use: No  . Drug use: No    Family History  Problem Relation Age of Onset  . Hyperlipidemia Father     Allergies  Allergen Reactions  . Cephalexin     Other reaction(s): unsure  . Contrast Media [Iodinated Diagnostic Agents]   . Latex     Other reaction(s): blistering, redness   . Mirtazapine     Other reaction(s): nausea  . Penicillins   . Sulfa Antibiotics     Medication list has been reviewed and updated.  Current Outpatient Medications on File Prior to Visit  Medication Sig Dispense Refill  . Calcium Carbonate-Vitamin D (LIQUID CALCIUM/VITAMIN D PO) Take by mouth.    . gabapentin (NEURONTIN) 300 MG capsule  Take 1 capsule (300 mg total) by mouth 3 (three) times daily. 90 capsule 4  . lactobacillus acidophilus (BACID) TABS tablet Take 1 tablet by mouth daily.     . lansoprazole (PREVACID) 30 MG capsule Take 1 capsule (30 mg total) daily at 12 noon by mouth. 90 capsule 3  . levothyroxine (SYNTHROID, LEVOTHROID) 88 MCG tablet Take 1 tablet (88 mcg total) by mouth daily before breakfast. 90 tablet 3  . simvastatin (ZOCOR) 20 MG tablet Take 1 tablet (20 mg total) by mouth daily. 90 tablet 3  . traMADol (ULTRAM) 50 MG tablet Take 1 tablet (50 mg total) by mouth 2 (two) times daily. 60 tablet 2   No current facility-administered medications on file prior to visit.     Review of Systems:  As per HPI-  otherwise negative. No rash No CP or SOB  BP Readings from Last 3 Encounters:  08/10/17 106/70  08/03/17 120/82  05/12/17 128/78    Pulse Readings from Last 3 Encounters:  08/10/17 82  08/03/17 67  05/12/17 (!) 58     Physical Examination: Vitals:   08/10/17 1240  BP: 106/70  Pulse: 82  Temp: 98.6 F (37 C)  SpO2: 95%   Vitals:   08/10/17 1240  Weight: 174 lb (78.9 kg)  Height: 5\' 6"  (1.676 m)   Body mass index is 28.08 kg/m. Ideal Body Weight: Weight in (lb) to have BMI = 25: 154.6  GEN: WDWN, NAD, Non-toxic, A & O x 3, mild overweight, looks well HEENT: Atraumatic, Normocephalic. Neck supple. No masses, No LAD.  Bilateral TM wnl, oropharynx red, inflamed- no exudate  PEERL,EOMI.   Ears and Nose: No external deformity. CV: RRR, No M/G/R. No JVD. No thrill. No extra heart sounds. PULM: CTA B, no wheezes, crackles, rhonchi. No retractions. No resp. distress. No accessory muscle use. ABD: S, NT, ND, +BS. No rebound. No HSM. EXTR: No c/c/e NEURO Normal gait.  PSYCH: Normally interactive. Conversant. Not depressed or anxious appearing.  Calm demeanor.   Results for orders placed or performed in visit on 08/10/17  POCT rapid strep A  Result Value Ref Range   Rapid Strep A Screen Negative Negative  POCT Influenza A/B  Result Value Ref Range   Influenza A, POC Negative Negative   Influenza B, POC Negative Negative     Assessment and Plan: Sore throat - Plan: POCT rapid strep A, azithromycin (ZITHROMAX) 250 MG tablet  Fatigue, unspecified type - Plan: POCT Influenza A/B  Cough - Plan: benzonatate (TESSALON) 100 MG capsule  Here today with cough, ST, feeling generally poorly for the last couple of days Negative for flu and strep as above Given tesssalon for cough zpack to hold and use for ST if not feeling better in the next couple of days  Signed Lamar Blinks, MD

## 2017-08-10 NOTE — Patient Instructions (Addendum)
Your flu and strep were both negative today- you likely have a viral illness. However, if you are not feeling better in the next couple of days go ahead and fill and use the azithromycin antibiotic rx.   I also gave you some tessalon perles to use as needed for cough

## 2017-08-12 DIAGNOSIS — Z85828 Personal history of other malignant neoplasm of skin: Secondary | ICD-10-CM | POA: Diagnosis not present

## 2017-08-12 DIAGNOSIS — C44729 Squamous cell carcinoma of skin of left lower limb, including hip: Secondary | ICD-10-CM | POA: Diagnosis not present

## 2017-08-13 DIAGNOSIS — Z4801 Encounter for change or removal of surgical wound dressing: Secondary | ICD-10-CM | POA: Diagnosis not present

## 2017-08-21 DIAGNOSIS — J370 Chronic laryngitis: Secondary | ICD-10-CM | POA: Diagnosis not present

## 2017-08-21 DIAGNOSIS — K219 Gastro-esophageal reflux disease without esophagitis: Secondary | ICD-10-CM | POA: Diagnosis not present

## 2017-08-26 NOTE — Progress Notes (Signed)
[place] at [facility] [address] [place], [place] [address] [phone]  Date:  08/27/2017   Name:  Mary Buck   DOB:  [birth year]   MRN:  [id number]  PCP:  Darreld Mclean, MD    Chief Complaint: Fall (Pt fell and states that she fell and hurt her back and her knee. States that pain isn't getting any better.Marland Kitchen); Back Pain; and Knee Pain   History of Present Illness:  Mary Buck is a 81 y.o. very pleasant female patient who presents with the following:  Pt is here because she had a fall at home on 12/2. She fell on her left side, on a tile floor.  She did not hit her head She broke her fall with her left hand-it was a bit sore and bruised, but this is now resolved. However, her left knee, hip and back are still bothering her.   She is using heat as needed, and also tylenol  She was able to get up and walk after the fall- "I jumped right up."  She notes that her soreness has gradually gotten worse instead of better as she expected.   Neurontin dose is 300 BID She is using tramadol BID- 50mg  However her pain is often keeping her awake right now  She wants to make sure there is no more serious injury She is able to walk and do her activities ok, but just feels sore and bruised  [name] is here with her today and contributes to history Patient Active Problem List   Diagnosis Date Noted  . Plantar fasciitis 07/07/2017  . DDD (degenerative disc disease), lumbar 06/01/2017  . Fibromyalgia 06/01/2017  . Spinal stenosis at L4-L5 level 04/30/2017  . Peripheral neuropathy 04/30/2017  . Encounter for medication monitoring 03/05/2017  . Bladder cancer ([place]) 08/20/2015  . Cervical stenosis of spinal canal 07/27/2015  . Spinal stenosis of cervical region 07/27/2015  . Spondylolisthesis 07/27/2015  . Hypothyroid 07/05/2015  . Abdominal pain, acute, left upper quadrant 06/28/2015  . Basal cell carcinoma, face 06/28/2015   . Bilateral groin pain 06/28/2015  . Chronic pain 06/28/2015  . Fatigue 06/28/2015  . Hyperlipidemia 06/28/2015  . Pain in joints 06/28/2015  . Paresthesia of lower lip 06/28/2015  . Pulmonary nodule 06/28/2015  . Pustulosis palmaris et plantaris 06/28/2015  . Ulcer of nose 06/28/2015  . 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  . Mass of parotid gland 03/24/2014    Past Medical History:  Diagnosis Date  . Allergy   . Asthma   . Cancer ([place])   . Cataract   . DDD (degenerative disc disease), lumbar 06/01/2017  . Depression   . Heart murmur   . Hyperlipidemia   . Peripheral neuropathy 04/30/2017  . Thyroid disease     Past Surgical History:  Procedure Laterality Date  . bladder cancer  2013  . CARPAL TUNNEL RELEASE  2015  . CHOLECYSTECTOMY    . TONSILLECTOMY AND ADENOIDECTOMY  1943    Social History   Tobacco Use  . Smoking status: Never Smoker  . Smokeless tobacco: Never Used  Substance Use Topics  . Alcohol use: No  . Drug use: No    Family History  Problem Relation Age of Onset  . Hyperlipidemia Father     Allergies  Allergen Reactions  . Cephalexin     Other reaction(s): unsure  . Contrast Media [Iodinated Diagnostic Agents]   . Latex     Other reaction(s):  blistering, redness   . Mirtazapine     Other reaction(s): nausea  . Penicillins   . Sulfa Antibiotics     Medication list has been reviewed and updated.  Current Outpatient Medications on File Prior to Visit  Medication Sig Dispense Refill  . Calcium Carbonate-Vitamin D (LIQUID CALCIUM/VITAMIN D PO) Take by mouth.    . gabapentin (NEURONTIN) 300 MG capsule Take 1 capsule (300 mg total) by mouth 3 (three) times daily. 90 capsule 4  . lactobacillus acidophilus (BACID) TABS tablet Take 1 tablet by mouth daily.     . lansoprazole (PREVACID) 30 MG capsule Take 1 capsule (30 mg total) daily at 12 noon by mouth. 90 capsule 3  . levothyroxine (SYNTHROID, LEVOTHROID) 88 MCG tablet Take 1 tablet (88 mcg total) by  mouth daily before breakfast. 90 tablet 3  . simvastatin (ZOCOR) 20 MG tablet Take 1 tablet (20 mg total) by mouth daily. 90 tablet 3  . traMADol (ULTRAM) 50 MG tablet Take 1 tablet (50 mg total) by mouth 2 (two) times daily. 60 tablet 2   No current facility-administered medications on file prior to visit.     Review of Systems:  As per HPI- otherwise negative. No fever or chills No CP or SOB No headache or head injury No vomiting or diarrhea No rash   Physical Examination: Vitals:   08/27/17 1414  BP: 138/78  Pulse: 65  Temp: 98.3 F (36.8 C)  SpO2: 96%   Vitals:   08/27/17 1414  Weight: 172 lb (78 kg)  Height: 5\' 6"  (1.676 m)   Body mass index is 27.76 kg/m. Ideal Body Weight: Weight in (lb) to have BMI = 25: 154.6  GEN: WDWN, NAD, Non-toxic, A & O x 3, overweight, looks well HEENT: Atraumatic, Normocephalic. Neck supple. No masses, No LAD. Ears and Nose: No external deformity. CV: RRR, No M/G/R. No JVD. No thrill. No extra heart sounds. PULM: CTA B, no wheezes, crackles, rhonchi. No retractions. No resp. distress. No accessory muscle use. ABD: S, NT, ND EXTR: No c/c/e NEURO Normal gait.  PSYCH: Normally interactive. Conversant. Not depressed or anxious appearing.  Calm demeanor.  Left hand is slightly tender over the 2nd and 3rd MC but not to the point of suggesting a fracture She had mohs surgery on her left shin recently- this is healing ok Left knee: tender at the lateal joint line.  Normal ROM and joint is stable.  No swelling or effusion Left hip; normal ROM but she is tender over the greater trochanter.  She also has pain in this area with figure 4 testing.  Normal strength of quads and hams She has tenderness and spasm of the lumbar paraspinous muscles. Left side only    Assessment and Plan: Pain in joint of left hip - Plan: DG HIP UNILAT W OR W/O PELVIS 2-3 VIEWS LEFT, meloxicam (MOBIC) 7.5 MG tablet  Pain and swelling of left knee - Plan: DG Knee  Complete 4 Views Left, meloxicam (MOBIC) 7.5 MG tablet  Here today after a fall about 10 days ago.  She is still sore and wanted to make sure all is well, no fractures.  Will obtain films for her Assuming these are ok, will have her add prn mobic to her regimen for a few days She will let me know if not significantly better in a week or so   Signed Lamar Blinks, MD  Called her to go over x-rays- negative for fracture.  She is pleased.  Advised that she does have some arthritis however   Dg Knee Complete 4 Views Left  Result Date: 08/27/2017 CLINICAL DATA:  Recent fall with left knee pain, initial encounter EXAM: LEFT KNEE - COMPLETE 4+ VIEW COMPARISON:  None. FINDINGS: Mild tricompartmental degenerative changes are noted. No acute fracture dislocation is seen. No joint effusion is noted. IMPRESSION: Degenerative change without acute abnormality. Electronically Signed   By: Inez Catalina M.D.   On: 08/27/2017 15:23   Dg Hip Unilat W Or W/o Pelvis 2-3 Views Left  Result Date: 08/27/2017 CLINICAL DATA:  Recent fall with left hip pain, initial encounter EXAM: DG HIP (WITH OR WITHOUT PELVIS) 2-3V LEFT COMPARISON:  None. FINDINGS: Pelvic ring is intact. Degenerative changes of the hip joints are noted bilaterally. No acute fracture or dislocation is noted. No soft tissue changes are seen. IMPRESSION: Degenerative change without acute abnormality. Electronically Signed   By: Inez Catalina M.D.   On: 08/27/2017 15:21

## 2017-08-27 ENCOUNTER — Ambulatory Visit (INDEPENDENT_AMBULATORY_CARE_PROVIDER_SITE_OTHER): Payer: Medicare Other | Admitting: Family Medicine

## 2017-08-27 ENCOUNTER — Ambulatory Visit (HOSPITAL_BASED_OUTPATIENT_CLINIC_OR_DEPARTMENT_OTHER)
Admission: RE | Admit: 2017-08-27 | Discharge: 2017-08-27 | Disposition: A | Discharge status: Home / Self Care | Payer: Medicare Other | Source: Ambulatory Visit | Attending: Family Medicine | Admitting: Family Medicine

## 2017-08-27 ENCOUNTER — Encounter: Payer: Self-pay | Admitting: Family Medicine

## 2017-08-27 VITALS — BP 138/78 | HR 65 | Temp 98.3°F | Ht 66.0 in | Wt 172.0 lb

## 2017-08-27 DIAGNOSIS — M25462 Effusion, left knee: Secondary | ICD-10-CM | POA: Insufficient documentation

## 2017-08-27 DIAGNOSIS — M25552 Pain in left hip: Secondary | ICD-10-CM

## 2017-08-27 DIAGNOSIS — M25562 Pain in left knee: Secondary | ICD-10-CM

## 2017-08-27 DIAGNOSIS — S79912A Unspecified injury of left hip, initial encounter: Secondary | ICD-10-CM | POA: Diagnosis not present

## 2017-08-27 DIAGNOSIS — M1712 Unilateral primary osteoarthritis, left knee: Secondary | ICD-10-CM | POA: Diagnosis not present

## 2017-08-27 DIAGNOSIS — S8992XA Unspecified injury of left lower leg, initial encounter: Secondary | ICD-10-CM | POA: Diagnosis not present

## 2017-08-27 DIAGNOSIS — M1612 Unilateral primary osteoarthritis, left hip: Secondary | ICD-10-CM | POA: Insufficient documentation

## 2017-08-27 MED ORDER — MELOXICAM 7.5 MG PO TABS: 7.5000 mg | ORAL_TABLET | Freq: Every day | ORAL | 0 refills | Status: DC

## 2017-08-27 NOTE — Patient Instructions (Signed)
Please stop by [facility] on the ground floor to have x-rays done. I will be in touch with your reports asap You can take 2 tramadol at bedtime if need be for the next few days  I also gave you some mobic to use as needed for pain- once a day

## 2017-08-31 DIAGNOSIS — M797 Fibromyalgia: Secondary | ICD-10-CM | POA: Diagnosis not present

## 2017-08-31 DIAGNOSIS — M48061 Spinal stenosis, lumbar region without neurogenic claudication: Secondary | ICD-10-CM | POA: Diagnosis not present

## 2017-08-31 DIAGNOSIS — M5136 Other intervertebral disc degeneration, lumbar region: Secondary | ICD-10-CM | POA: Diagnosis not present

## 2017-08-31 DIAGNOSIS — M5416 Radiculopathy, lumbar region: Secondary | ICD-10-CM | POA: Diagnosis not present

## 2017-08-31 DIAGNOSIS — G894 Chronic pain syndrome: Secondary | ICD-10-CM | POA: Diagnosis not present

## 2017-09-18 DIAGNOSIS — M48061 Spinal stenosis, lumbar region without neurogenic claudication: Secondary | ICD-10-CM | POA: Diagnosis not present

## 2017-09-23 ENCOUNTER — Other Ambulatory Visit: Payer: Self-pay | Admitting: Family Medicine

## 2017-09-23 DIAGNOSIS — M25462 Effusion, left knee: Secondary | ICD-10-CM

## 2017-09-23 DIAGNOSIS — K219 Gastro-esophageal reflux disease without esophagitis: Secondary | ICD-10-CM

## 2017-09-23 DIAGNOSIS — M25552 Pain in left hip: Secondary | ICD-10-CM

## 2017-09-23 DIAGNOSIS — M25562 Pain in left knee: Secondary | ICD-10-CM

## 2017-09-23 MED ORDER — LANSOPRAZOLE 30 MG PO CPDR: 30.0000 mg | DELAYED_RELEASE_CAPSULE | Freq: Two times a day (BID) | ORAL | 3 refills | Status: DC

## 2017-09-23 NOTE — Telephone Encounter (Signed)
Called her back- the increased prevacid dose was actually on the advice of her GI doc who lives in [place].  She is seeing her again in April. She is not sure how long she was to continue taking this twice a day. I will refill it for her, but advised that we generally don't use 60 mg a day long term- when her sx remit she will decrease her dose again to QD

## 2017-09-23 NOTE — Telephone Encounter (Signed)
Copied from [place] [id number]. Topic: Quick Communication - See Telephone Encounter >> Sep 23, 2017  9:44 AM Aurelio Brash B wrote: CRM for notification. See Telephone encounter for:  Refill  lansoprazole (PREVACID) 30 MG capsule   Pt states Dr Lorelei Pont told her to double the dosage so she needs refill sooner and pharmacy will not refill without another rx being sent  Pt is asking for 180 pills instead of 90.  [facility].    09/23/17.

## 2017-09-23 NOTE — Telephone Encounter (Signed)
Pt stating that she was told by Dr. Lorelei Pont to double the dosage of Prevacid. Current instructions state for the pt to take once daily. Pt is asking for refilll of 180 pills instead of 90.

## 2017-09-23 NOTE — Telephone Encounter (Signed)
Received refill request for meloxicam (MOBIC) 7.5 MG tablet.  Last office visit 08/27/17 and last refill 08/27/17.  Is it ok to refill? Please advise.

## 2017-09-29 DIAGNOSIS — D485 Neoplasm of uncertain behavior of skin: Secondary | ICD-10-CM | POA: Diagnosis not present

## 2017-09-29 DIAGNOSIS — L570 Actinic keratosis: Secondary | ICD-10-CM | POA: Diagnosis not present

## 2017-09-29 DIAGNOSIS — L565 Disseminated superficial actinic porokeratosis (DSAP): Secondary | ICD-10-CM | POA: Diagnosis not present

## 2017-09-29 DIAGNOSIS — Z85828 Personal history of other malignant neoplasm of skin: Secondary | ICD-10-CM | POA: Diagnosis not present

## 2017-09-29 DIAGNOSIS — L821 Other seborrheic keratosis: Secondary | ICD-10-CM | POA: Diagnosis not present

## 2017-10-13 DIAGNOSIS — M5136 Other intervertebral disc degeneration, lumbar region: Secondary | ICD-10-CM | POA: Diagnosis not present

## 2017-10-13 DIAGNOSIS — M4316 Spondylolisthesis, lumbar region: Secondary | ICD-10-CM | POA: Diagnosis not present

## 2017-10-13 DIAGNOSIS — M48061 Spinal stenosis, lumbar region without neurogenic claudication: Secondary | ICD-10-CM | POA: Diagnosis not present

## 2017-10-13 DIAGNOSIS — M797 Fibromyalgia: Secondary | ICD-10-CM | POA: Diagnosis not present

## 2017-10-15 DIAGNOSIS — C679 Malignant neoplasm of bladder, unspecified: Secondary | ICD-10-CM | POA: Diagnosis not present

## 2017-10-27 DIAGNOSIS — M48061 Spinal stenosis, lumbar region without neurogenic claudication: Secondary | ICD-10-CM | POA: Diagnosis not present

## 2017-10-29 ENCOUNTER — Telehealth: Payer: Self-pay | Admitting: Family Medicine

## 2017-10-29 DIAGNOSIS — M48061 Spinal stenosis, lumbar region without neurogenic claudication: Secondary | ICD-10-CM

## 2017-10-29 MED ORDER — TRAMADOL HCL 50 MG PO TABS: 50.0000 mg | ORAL_TABLET | Freq: Two times a day (BID) | ORAL | 2 refills | Status: DC

## 2017-10-29 NOTE — Telephone Encounter (Signed)
Pt requesting refill on Tramadol.

## 2017-10-29 NOTE — Telephone Encounter (Signed)
Checked [facility]- ok to refill  

## 2017-10-29 NOTE — Telephone Encounter (Signed)
Copied from [place]. Topic: Quick Communication - Rx Refill/Question >> Oct 29, 2017  3:07 PM Margot Ables wrote: Medication: tramadol - pt states that she is almost out and [facility] faxed it around 10am today - advised pt to allow 72 hours - pt will be going out of town and states she needs it before she leaves this weekend. Has the patient contacted their pharmacy? Yes.  Pharmacy said it was faxed Preferred Pharmacy (with phone number or street name): [facility] - [place], [place] - [address] AT [place] [address] [place] [address] Phone: [phone] Fax: [phone]  Agent: Please be advised that RX refills may take up to 3 business days. We ask that you follow-up with your pharmacy.

## 2017-11-25 NOTE — Progress Notes (Signed)
[place] at [facility] [address] [place], [place] [address] [phone]  Date:  11/26/2017   Name:  Mary Buck   DOB:  03-09-1936   MRN:  [id number]  PCP:  Darreld Mclean, MD    Chief Complaint: Fatigue (c/o feeling sleepy after eating for the past few months. )   History of Present Illness:  Mary Buck is a 82 y.o. very pleasant female patient who presents with the following:  History of bladder cancer followed by [facility], recent cystoscopy normal She does see pain management for chronic back pain as well:  09/18/2017 PLAN: L L4/5 ESI today.  She did well for almost a year after a series of 3 in [place].  Increase GBPN to 1800 mg per day RF tramadol, 50, 60- 1 month only.  F/u with Opal Sidles in 4 weeks. Marland Kitchen HPI: This patient is a 82 yr old female referred by Marjean Donna for a LESI. She has had LESI's in [place]. Last one about one year ago(series of 3)- lasted almost one year. The pain has become almost unbearable. The pain is in the low back, left more than right LE. The pain is over the anterolateral thigh to the knee. She wants to know if there is any medication that will help in the meantime after the shot. She is taking GBPN, 300 mg TID and Tramadol, 50 mg BID PRN. She says the tramadol does help because the pain is much worse when she does not take it. She tolerates it well. She functions better on the medication than without it. She has had some balance problems but otherwise, no significant side effects. She will run out in 2 days. MRI - lumbar - 02/08/2016 Diffuse degenerative lumbar spondylosis Multilevel mild subluxations Multilevel spinal canal stenosis worst at L2-3 and L4-5 Multilevel NFS worst bilaterally at L2-3 and on the right at L5-S1  She had eyelid surgery 2 months ago and has been using eye drops since then for dryness She has been using systane gel as recommended by optho She does not think there is any  anti-redness ingredient in this drop but is not absolutely sure She notes that her eyes seem more bloodshot over the last few weeks She had not followed up with her eye doctor about this as of yet  She feels like her psoriasis is getting worse- she sees dermatology, Wilhemina Bonito She had an operation- ?biopsy- and seems to be having some pain in this area still   She also feels like her scalp is really dry no matter what shampoo she uses  She also notes a ringing in her ears "often," she wonders if this may be related to her balance concerns  Next month she is getting a colonoscopy and endoscopy by her GI doc in [place] And she will see her dentist there as well She saw her DDS in October and was told that she had a minor infection around an implant. She was given some sort of medication in a syringe but it broke so she has not been using it. She is not sure if she still needs any treatment for this at this time  She is also concerned about depression.  She tried a lot of different meds per her report, but none seemed to help.  She would be interested in doing some counseling.  No SI   She feels like her balance and strength are poor.  She would like  to try PT for these issues which is certainly fine Patient Active Problem List   Diagnosis Date Noted  . Plantar fasciitis 07/07/2017  . DDD (degenerative disc disease), lumbar 06/01/2017  . Fibromyalgia 06/01/2017  . Spinal stenosis at L4-L5 level 04/30/2017  . Peripheral neuropathy 04/30/2017  . Encounter for medication monitoring 03/05/2017  . Bladder cancer ([place]) 08/20/2015  . Cervical stenosis of spinal canal 07/27/2015  . Spinal stenosis of cervical region 07/27/2015  . Spondylolisthesis 07/27/2015  . Hypothyroid 07/05/2015  . Abdominal pain, acute, left upper quadrant 06/28/2015  . Basal cell carcinoma, face 06/28/2015  . Bilateral groin pain 06/28/2015  . Chronic pain 06/28/2015  . Fatigue 06/28/2015  . Hyperlipidemia 06/28/2015   . Pain in joints 06/28/2015  . Paresthesia of lower lip 06/28/2015  . Pulmonary nodule 06/28/2015  . Pustulosis palmaris et plantaris 06/28/2015  . Ulcer of nose 06/28/2015  . 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  . Mass of parotid gland 03/24/2014    Past Medical History:  Diagnosis Date  . Allergy   . Asthma   . Cancer ([place])   . Cataract   . DDD (degenerative disc disease), lumbar 06/01/2017  . Depression   . Heart murmur   . Hyperlipidemia   . Peripheral neuropathy 04/30/2017  . Thyroid disease     Past Surgical History:  Procedure Laterality Date  . bladder cancer  2013  . CARPAL TUNNEL RELEASE  2015  . CHOLECYSTECTOMY    . TONSILLECTOMY AND ADENOIDECTOMY  1943    Social History   Tobacco Use  . Smoking status: Never Smoker  . Smokeless tobacco: Never Used  Substance Use Topics  . Alcohol use: No  . Drug use: No    Family History  Problem Relation Age of Onset  . Hyperlipidemia Father     Allergies  Allergen Reactions  . Cephalexin     Other reaction(s): unsure  . Contrast Media [Iodinated Diagnostic Agents]   . Latex     Other reaction(s): blistering, redness   . Mirtazapine     Other reaction(s): nausea  . Penicillins   . Sulfa Antibiotics     Medication list has been reviewed and updated.  Current Outpatient Medications on File Prior to Visit  Medication Sig Dispense Refill  . Calcium Carbonate-Vitamin D (LIQUID CALCIUM/VITAMIN D PO) Take by mouth.    . gabapentin (NEURONTIN) 300 MG capsule Take 1 capsule (300 mg total) by mouth 3 (three) times daily. 90 capsule 4  . lactobacillus acidophilus (BACID) TABS tablet Take 1 tablet by mouth daily.     . lansoprazole (PREVACID) 30 MG capsule Take 1 capsule (30 mg total) by mouth 2 (two) times daily before a meal. 180 capsule 3  . levothyroxine (SYNTHROID, LEVOTHROID) 88 MCG tablet Take 1 tablet (88 mcg total) by mouth daily before breakfast. 90 tablet 3  . meloxicam (MOBIC) 7.5 MG tablet TAKE 1 TABLET BY MOUTH  DAILY. USE AS NEEDED FOR PAIN 30 tablet 0  . simvastatin (ZOCOR) 20 MG tablet Take 1 tablet (20 mg total) by mouth daily. 90 tablet 3  . traMADol (ULTRAM) 50 MG tablet Take 1 tablet (50 mg total) by mouth 2 (two) times daily. 60 tablet 2   No current facility-administered medications on file prior to visit.     Review of Systems:  As per HPI- otherwise negative.   Physical Examination: Vitals:   11/26/17 1604  BP: 130/72  Pulse: 68  Temp: 98.1 F (36.7 C)  SpO2: 97%  Vitals:   11/26/17 1604  Weight: 174 lb 12.8 oz (79.3 kg)  Height: 5\' 6"  (1.676 m)   Body mass index is 28.21 kg/m. Ideal Body Weight: Weight in (lb) to have BMI = 25: 154.6  GEN: WDWN, NAD, Non-toxic, A & O x 3, overweight, looks well  HEENT: Atraumatic, Normocephalic. Neck supple. No masses, No LAD.  Bilateral TM wnl, oropharynx normal.  PEERL,EOMI.  Sclerae appear normal Gums appear normal  Ears and Nose: No external deformity. CV: RRR, No M/G/R. No JVD. No thrill. No extra heart sounds. PULM: CTA B, no wheezes, crackles, rhonchi. No retractions. No resp. distress. No accessory muscle use. ABD: S, NT, ND, +BS. No rebound. No HSM. EXTR: No c/c/e NEURO Normal gait.  PSYCH: Normally interactive. Conversant. Not depressed or anxious appearing.  Calm demeanor.  Guttate type psoriasis findings on her legs and less so on her arms   Assessment and Plan: Tinnitus of both ears  Gastroesophageal reflux disease, esophagitis presence not specified  Bloodshot eyes  Psoriasis  Dysthymia  Poor balance - Plan: Ambulatory referral to Physical Therapy  She has a few concerns today She thinks that she has triamcinolone 0.1% at home; will start back using this, mixed 50/50 with a thick moisturizer.   We wonder if the eye drops she is using may contain a vasoconstrictor or antiredness agent, or perhaps she is just sensitive to her current drops. She will try an alternative and let me know if not helpful Also  encouraged her to speak to her eye doctor if her eye concerns continue Will have her see Terri or the counselor of her choice.  Suggested that she see her ENT doc about her tinnitus in case it is reverseable PT referral for her balance and general strength   Signed Lamar Blinks, MD

## 2017-11-26 ENCOUNTER — Ambulatory Visit (INDEPENDENT_AMBULATORY_CARE_PROVIDER_SITE_OTHER): Payer: Medicare Other | Admitting: Family Medicine

## 2017-11-26 VITALS — BP 130/72 | HR 68 | Temp 98.1°F | Ht 66.0 in | Wt 174.8 lb

## 2017-11-26 DIAGNOSIS — F341 Dysthymic disorder: Secondary | ICD-10-CM

## 2017-11-26 DIAGNOSIS — K219 Gastro-esophageal reflux disease without esophagitis: Secondary | ICD-10-CM | POA: Diagnosis not present

## 2017-11-26 DIAGNOSIS — H579 Unspecified disorder of eye and adnexa: Secondary | ICD-10-CM

## 2017-11-26 DIAGNOSIS — L409 Psoriasis, unspecified: Secondary | ICD-10-CM

## 2017-11-26 DIAGNOSIS — H9313 Tinnitus, bilateral: Secondary | ICD-10-CM | POA: Diagnosis not present

## 2017-11-26 DIAGNOSIS — R2689 Other abnormalities of gait and mobility: Secondary | ICD-10-CM | POA: Diagnosis not present

## 2017-11-26 NOTE — Patient Instructions (Addendum)
Please mix your steroid (triamcinolone) and Cetaphil or similar thick cream 50/50 in your palm and apply to your dry areas once a day.  You can use the Cetaphil plain as needed whenever your skin is dry.    Try a different dry eye drop or gel- make sure there is no "anti redness" agent in the eye treatment.  Perhaps see Karna Christmas- she is one of out therapists who comes to this office.  You can schedule an appt with her using [phone]  I will set you up for physical therapy for your balance.    You might have your ENT take a look at your ears regarding your tinnitus

## 2017-11-28 ENCOUNTER — Encounter: Payer: Self-pay | Admitting: Family Medicine

## 2017-12-02 DIAGNOSIS — M48061 Spinal stenosis, lumbar region without neurogenic claudication: Secondary | ICD-10-CM | POA: Diagnosis not present

## 2017-12-02 DIAGNOSIS — H04123 Dry eye syndrome of bilateral lacrimal glands: Secondary | ICD-10-CM | POA: Diagnosis not present

## 2017-12-02 DIAGNOSIS — M4316 Spondylolisthesis, lumbar region: Secondary | ICD-10-CM | POA: Diagnosis not present

## 2017-12-02 DIAGNOSIS — M5136 Other intervertebral disc degeneration, lumbar region: Secondary | ICD-10-CM | POA: Diagnosis not present

## 2017-12-02 DIAGNOSIS — M797 Fibromyalgia: Secondary | ICD-10-CM | POA: Diagnosis not present

## 2017-12-02 DIAGNOSIS — H16223 Keratoconjunctivitis sicca, not specified as Sjogren's, bilateral: Secondary | ICD-10-CM | POA: Diagnosis not present

## 2017-12-07 DIAGNOSIS — M25552 Pain in left hip: Secondary | ICD-10-CM | POA: Diagnosis not present

## 2017-12-07 DIAGNOSIS — R262 Difficulty in walking, not elsewhere classified: Secondary | ICD-10-CM | POA: Diagnosis not present

## 2017-12-07 DIAGNOSIS — M6281 Muscle weakness (generalized): Secondary | ICD-10-CM | POA: Diagnosis not present

## 2017-12-07 DIAGNOSIS — R2681 Unsteadiness on feet: Secondary | ICD-10-CM | POA: Diagnosis not present

## 2017-12-09 NOTE — Telephone Encounter (Signed)
Opened in error

## 2017-12-10 ENCOUNTER — Telehealth: Payer: Self-pay | Admitting: *Deleted

## 2017-12-10 DIAGNOSIS — M25552 Pain in left hip: Secondary | ICD-10-CM | POA: Diagnosis not present

## 2017-12-10 DIAGNOSIS — M6281 Muscle weakness (generalized): Secondary | ICD-10-CM | POA: Diagnosis not present

## 2017-12-10 DIAGNOSIS — C44722 Squamous cell carcinoma of skin of right lower limb, including hip: Secondary | ICD-10-CM | POA: Diagnosis not present

## 2017-12-10 DIAGNOSIS — R2681 Unsteadiness on feet: Secondary | ICD-10-CM | POA: Diagnosis not present

## 2017-12-10 DIAGNOSIS — R262 Difficulty in walking, not elsewhere classified: Secondary | ICD-10-CM | POA: Diagnosis not present

## 2017-12-10 DIAGNOSIS — Z85828 Personal history of other malignant neoplasm of skin: Secondary | ICD-10-CM | POA: Diagnosis not present

## 2017-12-10 DIAGNOSIS — L570 Actinic keratosis: Secondary | ICD-10-CM | POA: Diagnosis not present

## 2017-12-10 DIAGNOSIS — C44729 Squamous cell carcinoma of skin of left lower limb, including hip: Secondary | ICD-10-CM | POA: Diagnosis not present

## 2017-12-10 DIAGNOSIS — D485 Neoplasm of uncertain behavior of skin: Secondary | ICD-10-CM | POA: Diagnosis not present

## 2017-12-10 DIAGNOSIS — D0462 Carcinoma in situ of skin of left upper limb, including shoulder: Secondary | ICD-10-CM | POA: Diagnosis not present

## 2017-12-10 NOTE — Telephone Encounter (Signed)
Received Physician Orders/Plan of Care from [facility] PT; forwarded to provider/SLS 03/28

## 2017-12-14 ENCOUNTER — Telehealth: Payer: Self-pay | Admitting: Family Medicine

## 2017-12-14 NOTE — Telephone Encounter (Signed)
Copied from [place] [id number]. Topic: Quick Communication - See Telephone Encounter >> Dec 14, 2017 11:03 AM Boyd Kerbs wrote: CRM for notification.   Pt. Is wanting to have a new PT place to go.  Does not want to go to [facility].   She is wanting some one who pays attention and possible use heat.  More professional environment.   Please call pt.   See Telephone encounter for: 12/14/17.

## 2017-12-15 NOTE — Telephone Encounter (Signed)
Please give her a call back.  I am sorry that she did not have a good experience.  Is there a PT office that she particularity wants to try or should I refer to another office near her home?

## 2017-12-16 ENCOUNTER — Encounter: Payer: Self-pay | Admitting: Family Medicine

## 2017-12-16 DIAGNOSIS — C44729 Squamous cell carcinoma of skin of left lower limb, including hip: Secondary | ICD-10-CM | POA: Insufficient documentation

## 2017-12-16 NOTE — Telephone Encounter (Signed)
Spoke to [employer] who is listed on Pt's DPR. Mickie states that it's ok for Dr.Copland to select a different PT office close to the pt's home.

## 2017-12-17 NOTE — Telephone Encounter (Signed)
Mary Buck- can you please set up a referral to a different PT for her.  She did not like [facility] She lives in [place]. Thank you!

## 2017-12-18 DIAGNOSIS — M5416 Radiculopathy, lumbar region: Secondary | ICD-10-CM | POA: Diagnosis not present

## 2017-12-18 NOTE — Telephone Encounter (Signed)
Pt has been switched to [facility] in [facility]

## 2017-12-21 ENCOUNTER — Other Ambulatory Visit: Payer: Self-pay | Admitting: Family Medicine

## 2017-12-21 DIAGNOSIS — H9313 Tinnitus, bilateral: Secondary | ICD-10-CM | POA: Diagnosis not present

## 2017-12-21 DIAGNOSIS — M48061 Spinal stenosis, lumbar region without neurogenic claudication: Secondary | ICD-10-CM

## 2017-12-21 DIAGNOSIS — H903 Sensorineural hearing loss, bilateral: Secondary | ICD-10-CM | POA: Diagnosis not present

## 2017-12-21 NOTE — Telephone Encounter (Signed)
LOV: 11/26/17  Dr. Lorelei Pont  [facility]    [address]  [place]

## 2017-12-21 NOTE — Telephone Encounter (Signed)
Copied from [place] [phone]. Topic: Quick Communication - Rx Refill/Question >> Dec 21, 2017  2:53 PM Boyd Kerbs wrote:  Medication: traMADol (ULTRAM) 50 MG tablet  Pt. Is leaving out of town and will not be back until after her prescription will run out. Will be gone for 10 days  Has the patient contacted their pharmacy? No.  (Agent: If no, request that the patient contact the pharmacy for the refill.)  Preferred Pharmacy (with phone number or street name):   [facility] - [place], [place] - [address] AT [place] [address] [place] [address] Phone: [phone] Fax: [phone]   Agent: Please be advised that RX refills may take up to 3 business days. We ask that you follow-up with your pharmacy.

## 2017-12-22 NOTE — Telephone Encounter (Signed)
[facility]ed [facility] and gave ok for early refill. She has a rf on her current rx and does not need a new rx yet

## 2017-12-22 NOTE — Telephone Encounter (Signed)
Spoke with pharmacy, they say Rx is transferable and pt may go to a [facility] where she is traveling to get refill transferred. Notified pt. She states that would not be the best option for her as she is travelling for a medical procedure and did not want to have to find a pharmacy there. She is requesting PCP to give verbal to pharmacy that it is ok to fill Rx early?  Please advise.

## 2017-12-23 DIAGNOSIS — R2689 Other abnormalities of gait and mobility: Secondary | ICD-10-CM | POA: Diagnosis not present

## 2017-12-23 DIAGNOSIS — H8190 Unspecified disorder of vestibular function, unspecified ear: Secondary | ICD-10-CM | POA: Diagnosis not present

## 2017-12-31 DIAGNOSIS — L570 Actinic keratosis: Secondary | ICD-10-CM | POA: Diagnosis not present

## 2017-12-31 DIAGNOSIS — Z85828 Personal history of other malignant neoplasm of skin: Secondary | ICD-10-CM | POA: Diagnosis not present

## 2018-01-14 ENCOUNTER — Ambulatory Visit (INDEPENDENT_AMBULATORY_CARE_PROVIDER_SITE_OTHER): Payer: Medicare Other | Admitting: Psychology

## 2018-01-14 DIAGNOSIS — F331 Major depressive disorder, recurrent, moderate: Secondary | ICD-10-CM | POA: Diagnosis not present

## 2018-01-15 DIAGNOSIS — M48061 Spinal stenosis, lumbar region without neurogenic claudication: Secondary | ICD-10-CM | POA: Diagnosis not present

## 2018-01-21 ENCOUNTER — Telehealth: Payer: Self-pay | Admitting: *Deleted

## 2018-01-21 NOTE — Telephone Encounter (Signed)
Received Physician Orders/Plan of Care from [facility]; forwarded to provider/SLS 05/09

## 2018-01-27 ENCOUNTER — Ambulatory Visit: Payer: Self-pay | Admitting: *Deleted

## 2018-01-27 NOTE — Telephone Encounter (Signed)
Patient reports swelling if both legs .Left is more severe than the right. Patient states she had  Dermatology procedure done about one  Month ago on both lower legs MOHS surgery she thinks she took anti biotics and had a followup.She states the swelling is  Worse and has some redness as well. She denies  any chest pain or shortness of breath. No availability with Dr Lorelei Pont today or tommorow . Appt made with Dr Randel Pigg for tomorrow.

## 2018-01-27 NOTE — Telephone Encounter (Signed)
  Reason for Disposition . [1] MODERATE leg swelling (e.g., swelling extends up to knees) AND [2] new onset or worsening  Answer Assessment - Initial Assessment Questions 1. ONSET: "When did the swelling start?" (e.g., minutes, hours, days)       1 month ago  -  Getting worse  2. LOCATION: "What part of the leg is swollen?"  "Are both legs swollen or just one leg?"        Left left primarily  Slightly  On the right   3. SEVERITY: "How bad is the swelling?" (e.g., localized; mild, moderate, severe)  - Localized - small area of swelling localized to one leg  - MILD pedal edema - swelling limited to foot and ankle, pitting edema < 1/4 inch (6 mm) deep, rest and elevation eliminate most or all swelling  - MODERATE edema - swelling of lower leg to knee, pitting edema > 1/4 inch (6 mm) deep, rest and elevation only partially reduce swelling  - SEVERE edema - swelling extends above knee, facial or hand swelling present        Moderate  4. REDNESS: "Does the swelling look red or infected?"       Some redness and numbness Left leg at surgical site Right leg has smaller  Area of redness present as well 5. PAIN: "Is the swelling painful to touch?" If so, ask: "How painful is it?"   (Scale 1-10; mild, moderate or severe)       Painfull to touch 6   6. FEVER: "Do you have a fever?" If so, ask: "What is it, how was it measured, and when did it start?"     No  7. CAUSE: "What do you think is causing the leg swelling?"     Possibly  From recent surgery  By  Dermatologist   8. MEDICAL HISTORY: "Do you have a history of heart failure, kidney disease, liver failure, or cancer?"        No  9. RECURRENT SYMPTOM: "Have you had leg swelling before?" If so, ask: "When was the last time?" "What happened that time?"       No   10. OTHER SYMPTOMS: "Do you have any other symptoms?" (e.g., chest pain, difficulty breathing)      Fatigue  When walks  11. PREGNANCY: "Is there any chance you are pregnant?" "When was your  last menstrual period?"       n/a  Protocols used: LEG SWELLING AND EDEMA-A-AH

## 2018-01-28 ENCOUNTER — Ambulatory Visit (INDEPENDENT_AMBULATORY_CARE_PROVIDER_SITE_OTHER): Payer: Medicare Other | Admitting: Family Medicine

## 2018-01-28 ENCOUNTER — Encounter: Payer: Self-pay | Admitting: Family Medicine

## 2018-01-28 ENCOUNTER — Ambulatory Visit (HOSPITAL_BASED_OUTPATIENT_CLINIC_OR_DEPARTMENT_OTHER)
Admission: RE | Admit: 2018-01-28 | Discharge: 2018-01-28 | Disposition: A | Discharge status: Home / Self Care | Payer: Medicare Other | Source: Ambulatory Visit | Attending: Family Medicine | Admitting: Family Medicine

## 2018-01-28 VITALS — BP 130/78 | HR 70 | Temp 97.9°F | Resp 16 | Ht 66.0 in | Wt 176.4 lb

## 2018-01-28 DIAGNOSIS — E785 Hyperlipidemia, unspecified: Secondary | ICD-10-CM

## 2018-01-28 DIAGNOSIS — R3911 Hesitancy of micturition: Secondary | ICD-10-CM | POA: Diagnosis not present

## 2018-01-28 DIAGNOSIS — R600 Localized edema: Secondary | ICD-10-CM

## 2018-01-28 DIAGNOSIS — L0390 Cellulitis, unspecified: Secondary | ICD-10-CM | POA: Diagnosis not present

## 2018-01-28 LAB — URINALYSIS
BILIRUBIN URINE: NEGATIVE
Hgb urine dipstick: NEGATIVE
KETONES UR: NEGATIVE
Leukocytes, UA: NEGATIVE
Nitrite: NEGATIVE
PH: 6 (ref 5.0–8.0)
Specific Gravity, Urine: 1.025 (ref 1.000–1.030)
TOTAL PROTEIN, URINE-UPE24: NEGATIVE
Urine Glucose: NEGATIVE
Urobilinogen, UA: 0.2 (ref 0.0–1.0)

## 2018-01-28 LAB — SEDIMENTATION RATE: SED RATE: 7 mm/h (ref 0–30)

## 2018-01-28 LAB — CBC WITH DIFFERENTIAL/PLATELET
BASOS PCT: 3.1 % — AB (ref 0.0–3.0)
Basophils Absolute: 0.2 10*3/uL — ABNORMAL HIGH (ref 0.0–0.1)
EOS ABS: 0.3 10*3/uL (ref 0.0–0.7)
EOS PCT: 6.4 % — AB (ref 0.0–5.0)
HCT: 35.7 % — ABNORMAL LOW (ref 36.0–46.0)
Hemoglobin: 11.8 g/dL — ABNORMAL LOW (ref 12.0–15.0)
LYMPHS ABS: 1.6 10*3/uL (ref 0.7–4.0)
Lymphocytes Relative: 29.6 % (ref 12.0–46.0)
MCHC: 33.1 g/dL (ref 30.0–36.0)
MCV: 92.5 fl (ref 78.0–100.0)
MONO ABS: 0.6 10*3/uL (ref 0.1–1.0)
Monocytes Relative: 11.3 % (ref 3.0–12.0)
Neutro Abs: 2.6 10*3/uL (ref 1.4–7.7)
Neutrophils Relative %: 49.6 % (ref 43.0–77.0)
Platelets: 228 10*3/uL (ref 150.0–400.0)
RBC: 3.86 Mil/uL — AB (ref 3.87–5.11)
RDW: 14.9 % (ref 11.5–15.5)
WBC: 5.2 10*3/uL (ref 4.0–10.5)

## 2018-01-28 NOTE — Progress Notes (Signed)
Subjective:  I acted as a Education administrator for Dr. Charlett Blake. [place], [place]  Patient ID: Mary Buck, female    DOB: [birth year], 82 y.o.   MRN: [id number]  Chief Complaint  Patient presents with  . Leg Swelling    bilateral, left leg worse than right, worsened over the last 2 weeks, redness and painful to walk    HPI  Patient is in today for an acute visit for leg swelling. She had excisional biopsy on both legs with her dermatologist on 4/28 and she is healing slowly. She is noting some redness and warmth around both sites. Has significantly more swelling on left with some mild calf discomfort. Denies CP/palp/SOB/HA/congestion/fevers/GI or GU c/o. Taking meds as prescribed  Patient Care Team: Copland, Gay Filler, MD as PCP - General (Family Medicine)   Past Medical History:  Diagnosis Date  . Allergy   . Asthma   . Cancer ([facility])   . Cataract   . DDD (degenerative disc disease), lumbar 06/01/2017  . Depression   . Heart murmur   . Hyperlipidemia   . Peripheral neuropathy 04/30/2017  . Thyroid disease     Past Surgical History:  Procedure Laterality Date  . bladder cancer  2013  . CARPAL TUNNEL RELEASE  2015  . CHOLECYSTECTOMY    . TONSILLECTOMY AND ADENOIDECTOMY  1943    Family History  Problem Relation Age of Onset  . Hyperlipidemia Father     Social History   Socioeconomic History  . Marital status: Significant Other    Spouse name: Not on file  . Number of children: Not on file  . Years of education: Not on file  . Highest education level: Not on file  Occupational History  . Not on file  Social Needs  . Financial resource strain: Not on file  . Food insecurity:    Worry: Not on file    Inability: Not on file  . Transportation needs:    Medical: Not on file    Non-medical: Not on file  Tobacco Use  . Smoking status: Never Smoker  . Smokeless tobacco: Never Used  Substance and Sexual Activity  . Alcohol use: No  . Drug use: No  . Sexual activity: Not on file    Lifestyle  . Physical activity:    Days per week: Not on file    Minutes per session: Not on file  . Stress: Not on file  Relationships  . Social connections:    Talks on phone: Not on file    Gets together: Not on file    Attends religious service: Not on file    Active member of club or organization: Not on file    Attends meetings of clubs or organizations: Not on file    Relationship status: Not on file  . Intimate partner violence:    Fear of current or ex partner: Not on file    Emotionally abused: Not on file    Physically abused: Not on file    Forced sexual activity: Not on file  Other Topics Concern  . Not on file  Social History Narrative  . Not on file    Outpatient Medications Prior to Visit  Medication Sig Dispense Refill  . Calcium Carbonate-Vitamin D (LIQUID CALCIUM/VITAMIN D PO) Take by mouth.    . gabapentin (NEURONTIN) 300 MG capsule Take 1 capsule (300 mg total) by mouth 3 (three) times daily. 90 capsule 4  . lactobacillus acidophilus (BACID) TABS tablet Take 1 tablet by  mouth daily.     . lansoprazole (PREVACID) 30 MG capsule Take 1 capsule (30 mg total) by mouth 2 (two) times daily before a meal. 180 capsule 3  . levothyroxine (SYNTHROID, LEVOTHROID) 88 MCG tablet Take 1 tablet (88 mcg total) by mouth daily before breakfast. 90 tablet 3  . simvastatin (ZOCOR) 20 MG tablet Take 1 tablet (20 mg total) by mouth daily. 90 tablet 3  . traMADol (ULTRAM) 50 MG tablet Take 1 tablet (50 mg total) by mouth 2 (two) times daily. 60 tablet 2  . meloxicam (MOBIC) 7.5 MG tablet TAKE 1 TABLET BY MOUTH DAILY. USE AS NEEDED FOR PAIN 30 tablet 0   No facility-administered medications prior to visit.     Allergies  Allergen Reactions  . Cephalexin     Other reaction(s): unsure  . Contrast Media [Iodinated Diagnostic Agents]   . Latex     Other reaction(s): blistering, redness   . Mirtazapine     Other reaction(s): nausea  . Penicillins   . Sulfa Antibiotics   .  Sulfur Other (See Comments)    Review of Systems  Constitutional: Positive for malaise/fatigue. Negative for fever.  HENT: Negative for congestion.   Eyes: Negative for blurred vision.  Respiratory: Negative for shortness of breath.   Cardiovascular: Positive for leg swelling. Negative for chest pain and palpitations.  Gastrointestinal: Negative for abdominal pain, blood in stool and nausea.  Genitourinary: Positive for frequency. Negative for dysuria.  Musculoskeletal: Negative for falls.  Skin: Negative for rash.  Neurological: Positive for weakness. Negative for dizziness, loss of consciousness and headaches.  Endo/Heme/Allergies: Negative for environmental allergies.  Psychiatric/Behavioral: Negative for depression. The patient is not nervous/anxious.        Objective:    Physical Exam  Constitutional: She is oriented to person, place, and time. No distress.  HENT:  Head: Normocephalic and atraumatic.  Eyes: Conjunctivae are normal.  Neck: Neck supple. No thyromegaly present.  Cardiovascular: Normal rate, regular rhythm and normal heart sounds.  No murmur heard. Pulmonary/Chest: Effort normal and breath sounds normal. She has no wheezes.  Abdominal: She exhibits no distension and no mass.  Musculoskeletal: She exhibits edema.  Lymphadenopathy:    She has no cervical adenopathy.  Neurological: She is alert and oriented to person, place, and time.  Skin: Skin is warm and dry. No rash noted. She is not diaphoretic.  Biopsy site on both legs surrounded by erythema.   Psychiatric: Judgment normal.    BP 130/78 (BP Location: Left Arm, Patient Position: Sitting, Cuff Size: Normal)   Pulse 70   Temp 97.9 F (36.6 C) (Oral)   Resp 16   Ht 5\' 6"  (1.676 m)   Wt 176 lb 6.4 oz (80 kg)   SpO2 97%   BMI 28.47 kg/m  Wt Readings from Last 3 Encounters:  01/28/18 176 lb 6.4 oz (80 kg)  11/26/17 174 lb 12.8 oz (79.3 kg)  08/27/17 172 lb (78 kg)   BP Readings from Last 3  Encounters:  01/28/18 130/78  11/26/17 130/72  08/27/17 138/78     Immunization History  Administered Date(s) Administered  . Influenza Split 06/15/2009, 05/29/2011, 06/14/2013  . Influenza, High Dose Seasonal PF 06/14/2017  . Influenza, Seasonal, Injecte, Preservative Fre 05/21/2010  . Influenza,inj,Quad PF,6+ Mos 06/18/2015  . Influenza,inj,quad, With Preservative 06/30/2014  . Pneumococcal Conjugate-13 10/05/2014  . Pneumococcal-Unspecified 08/27/2004  . Tdap 12/13/2009  . Zoster 09/21/2013    Health Maintenance  Topic Date Due  . DEXA  SCAN  09/11/2003  . INFLUENZA VACCINE  04/15/2018  . TETANUS/TDAP  12/14/2019  . PNA vac Low Risk Adult  Completed    Lab Results  Component Value Date   WBC 5.2 01/28/2018   HGB 11.8 (L) 01/28/2018   HCT 35.7 (L) 01/28/2018   PLT 228.0 01/28/2018   GLUCOSE 98 08/03/2017   CHOL 163 08/03/2017   TRIG 165.0 (H) 08/03/2017   HDL 54.20 08/03/2017   LDLCALC 76 08/03/2017   ALT 16 08/03/2017   AST 20 08/03/2017   NA 142 08/03/2017   K 4.1 08/03/2017   CL 106 08/03/2017   CREATININE 0.86 08/03/2017   BUN 14 08/03/2017   CO2 31 08/03/2017   TSH 0.91 08/03/2017    Lab Results  Component Value Date   TSH 0.91 08/03/2017   Lab Results  Component Value Date   WBC 5.2 01/28/2018   HGB 11.8 (L) 01/28/2018   HCT 35.7 (L) 01/28/2018   MCV 92.5 01/28/2018   PLT 228.0 01/28/2018   Lab Results  Component Value Date   NA 142 08/03/2017   K 4.1 08/03/2017   CO2 31 08/03/2017   GLUCOSE 98 08/03/2017   BUN 14 08/03/2017   CREATININE 0.86 08/03/2017   BILITOT 0.4 08/03/2017   ALKPHOS 74 08/03/2017   AST 20 08/03/2017   ALT 16 08/03/2017   PROT 6.7 08/03/2017   ALBUMIN 3.7 08/03/2017   CALCIUM 9.2 08/03/2017   GFR 67.67 08/03/2017   Lab Results  Component Value Date   CHOL 163 08/03/2017   Lab Results  Component Value Date   HDL 54.20 08/03/2017   Lab Results  Component Value Date   LDLCALC 76 08/03/2017   Lab Results    Component Value Date   TRIG 165.0 (H) 08/03/2017   Lab Results  Component Value Date   CHOLHDL 3 08/03/2017   No results found for: HGBA1C       Assessment & Plan:   Problem List Items Addressed This Visit    Hyperlipidemia    Tolerating statin, encouraged heart healthy diet, avoid trans fats, minimize simple carbs and saturated fats. Increase exercise as tolerated      Pedal edema    Left>right leg after a skin excision with her dermatologist. Her ultrasound of the leg is negative for DVT. She is advised to elevate feet above heart as much as possible and to minimize sodium in diet. Report worsening symptoms      Relevant Orders   US Venous Img Lower Unilateral Left (Completed)   CBC w/Diff (Completed)   Sedimentation rate (Completed)   Cellulitis - Primary    Some erythema surrounding her excision site will treat with antibiotics and she will report if worsens.       Urinary hesitancy   Relevant Orders   Urinalysis (Completed)   Urine Culture (Completed)      I have discontinued Curt Bears Syfert's meloxicam. I am also having her maintain her lactobacillus acidophilus, Calcium Carbonate-Vitamin D (LIQUID CALCIUM/VITAMIN D PO), gabapentin, simvastatin, levothyroxine, lansoprazole, and traMADol.  No orders of the defined types were placed in this encounter.   CMA served as Education administrator during this visit. History, Physical and Plan performed by medical provider. Documentation and orders reviewed and attested to.  Penni Homans, MD

## 2018-01-28 NOTE — Patient Instructions (Signed)
Cleanse gently daily with hydrogen peroxide or Witch Hazel Astringent  Edema Edema is an abnormal buildup of fluids in your bodytissues. Edema is somewhatdependent on gravity to pull the fluid to the lowest place in your body. That makes the condition more common in the legs and thighs (lower extremities). Painless swelling of the feet and ankles is common and becomes more likely as you get older. It is also common in looser tissues, like around your eyes. When the affected area is squeezed, the fluid may move out of that spot and leave a dent for a few moments. This dent is called pitting. What are the causes? There are many possible causes of edema. Eating too much salt and being on your feet or sitting for a long time can cause edema in your legs and ankles. Hot weather may make edema worse. Common medical causes of edema include:  Heart failure.  Liver disease.  Kidney disease.  Weak blood vessels in your legs.  Cancer.  An injury.  Pregnancy.  Some medications.  Obesity.  What are the signs or symptoms? Edema is usually painless.Your skin may look swollen or shiny. How is this diagnosed? Your health care provider may be able to diagnose edema by asking about your medical history and doing a physical exam. You may need to have tests such as X-rays, an electrocardiogram, or blood tests to check for medical conditions that may cause edema. How is this treated? Edema treatment depends on the cause. If you have heart, liver, or kidney disease, you need the treatment appropriate for these conditions. General treatment may include:  Elevation of the affected body part above the level of your heart.  Compression of the affected body part. Pressure from elastic bandages or support stockings squeezes the tissues and forces fluid back into the blood vessels. This keeps fluid from entering the tissues.  Restriction of fluid and salt intake.  Use of a water pill (diuretic). These  medications are appropriate only for some types of edema. They pull fluid out of your body and make you urinate more often. This gets rid of fluid and reduces swelling, but diuretics can have side effects. Only use diuretics as directed by your health care provider.  Follow these instructions at home:  Keep the affected body part above the level of your heart when you are lying down.  Do not sit still or stand for prolonged periods.  Do not put anything directly under your knees when lying down.  Do not wear constricting clothing or garters on your upper legs.  Exercise your legs to work the fluid back into your blood vessels. This may help the swelling go down.  Wear elastic bandages or support stockings to reduce ankle swelling as directed by your health care provider.  Eat a low-salt diet to reduce fluid if your health care provider recommends it.  Only take medicines as directed by your health care provider. Contact a health care provider if:  Your edema is not responding to treatment.  You have heart, liver, or kidney disease and notice symptoms of edema.  You have edema in your legs that does not improve after elevating them.  You have sudden and unexplained weight gain. Get help right away if:  You develop shortness of breath or chest pain.  You cannot breathe when you lie down.  You develop pain, redness, or warmth in the swollen areas.  You have heart, liver, or kidney disease and suddenly get edema.  You have  a fever and your symptoms suddenly get worse. This information is not intended to replace advice given to you by your health care provider. Make sure you discuss any questions you have with your health care provider. Document Released: 09/01/2005 Document Revised: 02/07/2016 Document Reviewed: 06/24/2013 Elsevier Interactive Patient Education  2017 [employer].

## 2018-01-29 ENCOUNTER — Telehealth: Payer: Self-pay | Admitting: Family Medicine

## 2018-01-29 LAB — URINE CULTURE
MICRO NUMBER: 90597829
RESULT: NO GROWTH
SPECIMEN QUALITY:: ADEQUATE

## 2018-01-29 NOTE — Telephone Encounter (Signed)
Copied from [email or web address] [phone]. Topic: Quick Communication - See Telephone Encounter >> Jan 29, 2018 12:51 PM Aurelio Brash B wrote: CRM for notification. See Telephone encounter for: 01/29/18.  PT is requesting lab results

## 2018-01-31 DIAGNOSIS — R600 Localized edema: Secondary | ICD-10-CM | POA: Insufficient documentation

## 2018-01-31 DIAGNOSIS — R3911 Hesitancy of micturition: Secondary | ICD-10-CM | POA: Insufficient documentation

## 2018-01-31 DIAGNOSIS — L0390 Cellulitis, unspecified: Secondary | ICD-10-CM | POA: Insufficient documentation

## 2018-01-31 NOTE — Assessment & Plan Note (Signed)
Tolerating statin, encouraged heart healthy diet, avoid trans fats, minimize simple carbs and saturated fats. Increase exercise as tolerated 

## 2018-01-31 NOTE — Assessment & Plan Note (Signed)
Some erythema surrounding her excision site will treat with antibiotics and she will report if worsens.

## 2018-01-31 NOTE — Assessment & Plan Note (Addendum)
Left>right leg after a skin excision with her dermatologist. Her ultrasound of the leg is negative for DVT. She is advised to elevate feet above heart as much as possible and to minimize sodium in diet. Report worsening symptoms

## 2018-02-01 NOTE — Telephone Encounter (Signed)
Patient has been notified of results.  

## 2018-02-02 NOTE — Progress Notes (Signed)
[facility] at [facility] [place], [place], [place] [address] [phone]  Date:  02/03/2018   Name:  Mary Buck   DOB:  1936-04-10   MRN:  [id number]  PCP:  Darreld Mclean, MD    Chief Complaint: Leg Pain (bilateral, left leg worse than right)   History of Present Illness:  Mary Buck is a 82 y.o. very pleasant female patient who presents with the following: Accompanied today by her partner [name] per usual  History of hypothyroidism, hyperlipidemia, chronic pain in her legs  Here today with complaint of leg pain I last saw her in March of this year  History of bladder cancer followed by [facility], recent cystoscopy normal She does see pain management for chronic back pain as well:  She got an epidural steroid injection per her pain management clinic at [facility] on 4/5: This patient is a 82 yr old female referred by Marjean Donna for a LESI. She has had LESI's in [place]. Last one about one year ago(series of 3)- lasted almost one year. The pain has become almost unbearable. The pain is in the low back, left more than right LE. The pain is over the anterolateral thigh to the knee. She reports some relief of the pain after one left L4/5 ESI. However, she still has significant pain. She did well after a series of 3 LESI's in [facility] and she would like a second today.  09/18/2017 PLAN: L L4/5 ESI today.  She did well for almost a year after a series of 3 in [place].  Increase GBPN to 1800 mg per day RF tramadol, 50, 60- 1 month only.  F/u with Opal Sidles in 4 weeks. Marland Kitchen  HPI: This patient is a 82 yr old female referred by Marjean Donna for a LESI. She has had LESI's in [place]. Last one about one year ago(series of 3)- lasted almost one year. The pain has become almost unbearable. The pain is in the low back, left more than right LE. The pain is over the anterolateral thigh to the knee. She wants to know if there is any medication that will help in the  meantime after the shot. She is taking GBPN, 300 mg TID and Tramadol, 50 mg BID PRN. She says the tramadol does help because the pain is much worse when she does not take it. She tolerates it well. She functions better on the medication than without it. She has had some balance problems but otherwise, no significant side effects. She will run out in 2 days.   She also saw Dr. Charlett Blake on the 16th of this month for swelling and mild cellulitis of her LE following a bx per Derm.   01/17/2018  2  01/15/2018  Tramadol Hcl 50 Mg Tablet  60 30 Wi Spi  [id number]  [facility] ([id number])  0 10.00 MME Comm Ins  [place]  12/22/2017  2  10/29/2017  Tramadol Hcl 50 Mg Tablet  60 30 Je Cop  [id number]  [facility] ([id number])  2 10.00 MME Comm Ins  [place]  11/29/2017  2  10/29/2017  Tramadol Hcl 50 Mg Tablet  60 30 Je Cop  [id number]  [facility] ([id number])  1 10.00 MME Comm Ins    10/29/2017  2  10/29/2017  Tramadol Hcl 50 Mg Tablet  60 30 Je Cop  [id number]  [facility] ([id number])  0 10.00 MME Comm Ins    09/29/2017  2  04/30/2017  Tramadol Hcl  50 Mg Tablet  60 30 Je Cop  [id number]  [facility] ([id number])  0 10.00 MME Comm Ins  [place]  08/29/2017  2  06/03/2017  Tramadol Hcl 50 Mg Tablet  60 30 Ja Kei  [id number]  [facility] [phone])  2      Lab Results  Component Value Date   TSH 0.91 08/03/2017    She has noted swelling in her bilateral lower legs for about one month. This is a bit better in the am but "not much"  She tried wearing support socks but this did not help that much  She is also upset as the skin bx are still healing   She also notes that she is feeling sleepy much of the time over the last month She feels like she could take a nap, and occasionally will fall asleep unexpectedly She is not sleeping all that well at night She does snore at night according to her partner who is here today  She actually feel asleep while eating once  She also has complaint of pain in her bilateal knees and hips.  Some of this is likely due to her back  Her worse joint is her left hip- we did get films for her  last winter that showed degenerative change.  She is interested in a consultation with an orthopedic surgeon at this time  She is concerned about her weight and weight gain  Wt Readings from Last 3 Encounters:  02/03/18 176 lb (79.8 kg)  01/28/18 176 lb 6.4 oz (80 kg)  11/26/17 174 lb 12.8 oz (79.3 kg)   8/16- 172 lb  Patient Active Problem List   Diagnosis Date Noted  . Pedal edema 01/31/2018  . Cellulitis 01/31/2018  . Urinary hesitancy 01/31/2018  . Squamous cell skin cancer 12/16/2017  . Plantar fasciitis 07/07/2017  . DDD (degenerative disc disease), lumbar 06/01/2017  . Fibromyalgia 06/01/2017  . Spinal stenosis at L4-L5 level 04/30/2017  . Peripheral neuropathy 04/30/2017  . Encounter for medication monitoring 03/05/2017  . Bladder cancer ([place]) 08/20/2015  . Cervical stenosis of spinal canal 07/27/2015  . Spinal stenosis of cervical region 07/27/2015  . Spondylolisthesis 07/27/2015  . Hypothyroid 07/05/2015  . Abdominal pain, acute, left upper quadrant 06/28/2015  . Basal cell carcinoma, face 06/28/2015  . Bilateral groin pain 06/28/2015  . Chronic pain 06/28/2015  . Fatigue 06/28/2015  . Hyperlipidemia 06/28/2015  . Pain in joints 06/28/2015  . Paresthesia of lower lip 06/28/2015  . Pulmonary nodule 06/28/2015  . Pustulosis palmaris et plantaris 06/28/2015  . Ulcer of nose 06/28/2015  . 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  . Mass of parotid gland 03/24/2014    Past Medical History:  Diagnosis Date  . Allergy   . Asthma   . Cancer ([place])   . Cataract   . DDD (degenerative disc disease), lumbar 06/01/2017  . Depression   . Heart murmur   . Hyperlipidemia   . Peripheral neuropathy 04/30/2017  . Thyroid disease     Past Surgical History:  Procedure Laterality Date  . bladder cancer  2013  . CARPAL TUNNEL RELEASE  2015  . CHOLECYSTECTOMY    . TONSILLECTOMY AND ADENOIDECTOMY  1943    Social History   Tobacco Use  . Smoking status: Never Smoker  . Smokeless  tobacco: Never Used  Substance Use Topics  . Alcohol use: No  . Drug use: No    Family History  Problem Relation Age of Onset  . Hyperlipidemia Father  Allergies  Allergen Reactions  . Cephalexin     Other reaction(s): unsure  . Contrast Media [Iodinated Diagnostic Agents]   . Latex     Other reaction(s): blistering, redness   . Mirtazapine     Other reaction(s): nausea  . Penicillins   . Sulfa Antibiotics   . Sulfur Other (See Comments)    Medication list has been reviewed and updated.  Current Outpatient Medications on File Prior to Visit  Medication Sig Dispense Refill  . Calcium Carbonate-Vitamin D (LIQUID CALCIUM/VITAMIN D PO) Take by mouth.    . gabapentin (NEURONTIN) 300 MG capsule Take 1 capsule (300 mg total) by mouth 3 (three) times daily. 90 capsule 4  . lactobacillus acidophilus (BACID) TABS tablet Take 1 tablet by mouth daily.     . lansoprazole (PREVACID) 30 MG capsule Take 1 capsule (30 mg total) by mouth 2 (two) times daily before a meal. 180 capsule 3  . levothyroxine (SYNTHROID, LEVOTHROID) 88 MCG tablet Take 1 tablet (88 mcg total) by mouth daily before breakfast. 90 tablet 3  . simvastatin (ZOCOR) 20 MG tablet Take 1 tablet (20 mg total) by mouth daily. 90 tablet 3  . traMADol (ULTRAM) 50 MG tablet Take 1 tablet (50 mg total) by mouth 2 (two) times daily. 60 tablet 2   No current facility-administered medications on file prior to visit.     Review of Systems:  As per HPI- otherwise negative.   Physical Examination: Vitals:   02/03/18 1110  BP: 128/72  Pulse: 86  Resp: 16  Temp: 98.1 F (36.7 C)  SpO2: 97%   Vitals:   02/03/18 1110  Weight: 176 lb (79.8 kg)  Height: 5\' 6"  (1.676 m)   Body mass index is 28.41 kg/m. Ideal Body Weight: Weight in (lb) to have BMI = 25: 154.6  GEN: WDWN, NAD, Non-toxic, A & O x 3, overweight, looks well  HEENT: Atraumatic, Normocephalic. Neck supple. No masses, No LAD. Ears and Nose: No external  deformity. CV: RRR, No M/G/R. No JVD. No thrill. No extra heart sounds. PULM: CTA B, no wheezes, crackles, rhonchi. No retractions. No resp. distress. No accessory muscle use. ABD: S, NT, ND, +BS. No rebound. No HSM. EXTR: No c/c.  Trace edema of the bilateral lower legs.  She has what appear to be healing punch bx on her legs in 2 locations  NEURO Normal gait.  PSYCH: Normally interactive. Conversant. Not depressed or anxious appearing.  Calm demeanor.    Assessment and Plan: Somnolence - Plan: Ambulatory referral to Neurology, TSH, CBC  Peripheral edema - Plan: hydrochlorothiazide (MICROZIDE) 12.5 MG capsule, Basic metabolic panel  Chronic pain of both knees - Plan: CANCELED: Ambulatory referral to Orthopedic Surgery  Pain of both hip joints - Plan: Ambulatory referral to Orthopedic Surgery, CANCELED: Ambulatory referral to Orthopedic Surgery  Acquired hypothyroidism - Plan: TSH  Medication monitoring encounter - Plan: Basic metabolic panel, CBC  Weight gain  Somnolence, weight gain, poor sleep and snoring.  Suspect OSA or another sleep disorder referfal to neurology for evaluation  Ortho referral for her hip pain- ?may want to have a joint replacement Labs pending as above Weight gain may be related to OSA if present Low dose of HCTZ to use prn for edema   Signed Lamar Blinks, MD

## 2018-02-03 ENCOUNTER — Ambulatory Visit (INDEPENDENT_AMBULATORY_CARE_PROVIDER_SITE_OTHER): Payer: Medicare Other | Admitting: Family Medicine

## 2018-02-03 ENCOUNTER — Encounter: Payer: Self-pay | Admitting: Family Medicine

## 2018-02-03 VITALS — BP 128/72 | HR 86 | Temp 98.1°F | Resp 16 | Ht 66.0 in | Wt 176.0 lb

## 2018-02-03 DIAGNOSIS — G8929 Other chronic pain: Secondary | ICD-10-CM

## 2018-02-03 DIAGNOSIS — M25551 Pain in right hip: Secondary | ICD-10-CM

## 2018-02-03 DIAGNOSIS — Z5181 Encounter for therapeutic drug level monitoring: Secondary | ICD-10-CM | POA: Diagnosis not present

## 2018-02-03 DIAGNOSIS — R400 Somnolence: Secondary | ICD-10-CM

## 2018-02-03 DIAGNOSIS — R609 Edema, unspecified: Secondary | ICD-10-CM

## 2018-02-03 DIAGNOSIS — M25562 Pain in left knee: Secondary | ICD-10-CM | POA: Diagnosis not present

## 2018-02-03 DIAGNOSIS — M25561 Pain in right knee: Secondary | ICD-10-CM

## 2018-02-03 DIAGNOSIS — R635 Abnormal weight gain: Secondary | ICD-10-CM

## 2018-02-03 DIAGNOSIS — E039 Hypothyroidism, unspecified: Secondary | ICD-10-CM

## 2018-02-03 DIAGNOSIS — M25552 Pain in left hip: Secondary | ICD-10-CM | POA: Diagnosis not present

## 2018-02-03 DIAGNOSIS — R600 Localized edema: Secondary | ICD-10-CM

## 2018-02-03 MED ORDER — HYDROCHLOROTHIAZIDE 12.5 MG PO CAPS: 12.5000 mg | ORAL_CAPSULE | Freq: Every day | ORAL | 6 refills | Status: DC

## 2018-02-03 NOTE — Patient Instructions (Signed)
Good to see you today as always!   Take care and I will set you up with  1. Ortho to discuss your hips 2. Neurology to discuss a sleep study  Try the HCTZ (fluid pill) as needed for swelling in your legs. You can use this daily as needed Please come in for a lab visit only in 1-2 months and we will check your thyroid and repeat your blood counts.    Take care

## 2018-02-05 DIAGNOSIS — M25552 Pain in left hip: Secondary | ICD-10-CM | POA: Diagnosis not present

## 2018-02-05 DIAGNOSIS — M25551 Pain in right hip: Secondary | ICD-10-CM | POA: Diagnosis not present

## 2018-02-12 ENCOUNTER — Ambulatory Visit: Payer: Medicare Other | Admitting: Psychology

## 2018-02-22 DIAGNOSIS — K2960 Other gastritis without bleeding: Secondary | ICD-10-CM | POA: Diagnosis not present

## 2018-02-22 DIAGNOSIS — K224 Dyskinesia of esophagus: Secondary | ICD-10-CM | POA: Diagnosis not present

## 2018-02-22 DIAGNOSIS — K3189 Other diseases of stomach and duodenum: Secondary | ICD-10-CM | POA: Diagnosis not present

## 2018-02-22 DIAGNOSIS — K30 Functional dyspepsia: Secondary | ICD-10-CM | POA: Diagnosis not present

## 2018-02-22 DIAGNOSIS — Z1211 Encounter for screening for malignant neoplasm of colon: Secondary | ICD-10-CM | POA: Diagnosis not present

## 2018-02-22 DIAGNOSIS — K2900 Acute gastritis without bleeding: Secondary | ICD-10-CM | POA: Diagnosis not present

## 2018-02-22 DIAGNOSIS — K219 Gastro-esophageal reflux disease without esophagitis: Secondary | ICD-10-CM | POA: Diagnosis not present

## 2018-02-22 DIAGNOSIS — K449 Diaphragmatic hernia without obstruction or gangrene: Secondary | ICD-10-CM | POA: Diagnosis not present

## 2018-02-22 DIAGNOSIS — K2970 Gastritis, unspecified, without bleeding: Secondary | ICD-10-CM | POA: Diagnosis not present

## 2018-02-22 HISTORY — PX: COLONOSCOPY WITH ESOPHAGOGASTRODUODENOSCOPY (EGD): SHX5779

## 2018-03-01 ENCOUNTER — Encounter: Payer: Self-pay | Admitting: Family Medicine

## 2018-03-01 ENCOUNTER — Ambulatory Visit (INDEPENDENT_AMBULATORY_CARE_PROVIDER_SITE_OTHER): Payer: Medicare Other | Admitting: Family Medicine

## 2018-03-01 VITALS — BP 140/78 | HR 68 | Temp 98.1°F | Resp 10 | Ht 66.0 in | Wt 172.0 lb

## 2018-03-01 DIAGNOSIS — K136 Irritative hyperplasia of oral mucosa: Secondary | ICD-10-CM | POA: Diagnosis not present

## 2018-03-01 MED ORDER — FIRST-DUKES MOUTHWASH MT SUSP: OROMUCOSAL | 1 refills | Status: DC

## 2018-03-01 NOTE — Patient Instructions (Signed)
Please use the mouth rinse as needed for your oral irritation. Let me know if not better by the end of this week- Sooner if worse.

## 2018-03-01 NOTE — Progress Notes (Signed)
[place] at [facility] [address], [place], [place] [address] [phone]  Date:  03/01/2018   Name:  Mary Buck   DOB:  [birth year]   MRN:  [id number]  PCP:  Darreld Mclean, MD    Chief Complaint: No chief complaint on file.   History of Present Illness:  Mary Buck is a 82 y.o. very pleasant female patient who presents with the following:  Seen here a few weeks ago She has noted a raw feeling in her mouth for several days All over, but most bothersome on the hard palate and inside of her lower lip She wondered if she could have thrush but cannot think of why that might be, has not seen any white plaques or ulcerations in her mouth  She tried a baking soda and salt rinse but it did not help   Just drove home from [place] where she saw her GI doc- they did an upper GI and colon  She also saw her dentist while she was there   She has noted a scratchy throat and sneezing for a few days Also notes that she feels tired since the trip to [place]  Patient Active Problem List   Diagnosis Date Noted  . Pedal edema 01/31/2018  . Cellulitis 01/31/2018  . Urinary hesitancy 01/31/2018  . Squamous cell skin cancer 12/16/2017  . Plantar fasciitis 07/07/2017  . DDD (degenerative disc disease), lumbar 06/01/2017  . Fibromyalgia 06/01/2017  . Spinal stenosis at L4-L5 level 04/30/2017  . Peripheral neuropathy 04/30/2017  . Encounter for medication monitoring 03/05/2017  . Bladder cancer ([place]) 08/20/2015  . Cervical stenosis of spinal canal 07/27/2015  . Spinal stenosis of cervical region 07/27/2015  . Spondylolisthesis 07/27/2015  . Hypothyroid 07/05/2015  . Abdominal pain, acute, left upper quadrant 06/28/2015  . Basal cell carcinoma, face 06/28/2015  . Bilateral groin pain 06/28/2015  . Chronic pain 06/28/2015  . Fatigue 06/28/2015  . Hyperlipidemia 06/28/2015  . Pain in joints 06/28/2015  . Paresthesia of  lower lip 06/28/2015  . Pulmonary nodule 06/28/2015  . Pustulosis palmaris et plantaris 06/28/2015  . Ulcer of nose 06/28/2015  . 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  . Mass of parotid gland 03/24/2014    Past Medical History:  Diagnosis Date  . Allergy   . Asthma   . Cancer ([place])   . Cataract   . DDD (degenerative disc disease), lumbar 06/01/2017  . Depression   . Heart murmur   . Hyperlipidemia   . Peripheral neuropathy 04/30/2017  . Thyroid disease     Past Surgical History:  Procedure Laterality Date  . bladder cancer  2013  . CARPAL TUNNEL RELEASE  2015  . CHOLECYSTECTOMY    . TONSILLECTOMY AND ADENOIDECTOMY  1943    Social History   Tobacco Use  . Smoking status: Never Smoker  . Smokeless tobacco: Never Used  Substance Use Topics  . Alcohol use: No  . Drug use: No    Family History  Problem Relation Age of Onset  . Hyperlipidemia Father     Allergies  Allergen Reactions  . Cephalexin     Other reaction(s): unsure  . Contrast Media [Iodinated Diagnostic Agents]   . Latex     Other reaction(s): blistering, redness   . Mirtazapine     Other reaction(s): nausea  . Penicillins   . Sulfa Antibiotics   . Sulfur Other (See Comments)    Medication list has been reviewed  and updated.  Current Outpatient Medications on File Prior to Visit  Medication Sig Dispense Refill  . Calcium Carbonate-Vitamin D (LIQUID CALCIUM/VITAMIN D PO) Take by mouth.    . gabapentin (NEURONTIN) 300 MG capsule Take 1 capsule (300 mg total) by mouth 3 (three) times daily. 90 capsule 4  . lactobacillus acidophilus (BACID) TABS tablet Take 1 tablet by mouth daily.     . lansoprazole (PREVACID) 30 MG capsule Take 1 capsule (30 mg total) by mouth 2 (two) times daily before a meal. 180 capsule 3  . levothyroxine (SYNTHROID, LEVOTHROID) 88 MCG tablet Take 1 tablet (88 mcg total) by mouth daily before breakfast. 90 tablet 3  . simvastatin (ZOCOR) 20 MG tablet Take 1 tablet (20 mg total) by mouth  daily. 90 tablet 3  . traMADol (ULTRAM) 50 MG tablet Take 1 tablet (50 mg total) by mouth 2 (two) times daily. 60 tablet 2   No current facility-administered medications on file prior to visit.     Review of Systems:  As per HPI- otherwise negative. No fever or chills No vomiting No belly pain  Physical Examination: Vitals:   03/01/18 1525  BP: 140/78  Pulse: 68  Resp: 10  Temp: 98.1 F (36.7 C)  SpO2: 98%   Vitals:   03/01/18 1525  Weight: 172 lb (78 kg)  Height: 5\' 6"  (1.676 m)   Body mass index is 27.76 kg/m. Ideal Body Weight: Weight in (lb) to have BMI = 25: 154.6  GEN: WDWN, NAD, Non-toxic, A & O x 3 HEENT: Atraumatic, Normocephalic. Neck supple. No masses, No LAD.  Bilateral TM wnl, oropharynx normal.  PEERL,EOMI.   Ears and Nose: No external deformity. CV: RRR, No M/G/R. No JVD. No thrill. No extra heart sounds. PULM: CTA B, no wheezes, crackles, rhonchi. No retractions. No resp. distress. No accessory muscle use. EXTR: No c/c/e NEURO Normal gait.  PSYCH: Normally interactive. Conversant. Not depressed or anxious appearing.  Calm demeanor.  Oral cavity appears normal Pt removed her upper dentures for exam No ulcerations or thrush apparent  Assessment and Plan: Irritation of oral cavity - Plan: Diphenhyd-Hydrocort-Nystatin (FIRST-DUKES MOUTHWASH) SUSP  Non- specific irritation of the oral cavity DMM rx for her to use as needed  Asked her to let me know if not feeling better by the end of this week  Signed Lamar Blinks, MD

## 2018-03-03 DIAGNOSIS — M797 Fibromyalgia: Secondary | ICD-10-CM | POA: Diagnosis not present

## 2018-03-03 DIAGNOSIS — M5136 Other intervertebral disc degeneration, lumbar region: Secondary | ICD-10-CM | POA: Diagnosis not present

## 2018-03-03 DIAGNOSIS — M48061 Spinal stenosis, lumbar region without neurogenic claudication: Secondary | ICD-10-CM | POA: Diagnosis not present

## 2018-03-08 ENCOUNTER — Encounter: Payer: Self-pay | Admitting: Family Medicine

## 2018-03-08 DIAGNOSIS — L820 Inflamed seborrheic keratosis: Secondary | ICD-10-CM | POA: Diagnosis not present

## 2018-03-08 DIAGNOSIS — D485 Neoplasm of uncertain behavior of skin: Secondary | ICD-10-CM | POA: Diagnosis not present

## 2018-03-08 DIAGNOSIS — Z85828 Personal history of other malignant neoplasm of skin: Secondary | ICD-10-CM | POA: Diagnosis not present

## 2018-03-08 DIAGNOSIS — C44722 Squamous cell carcinoma of skin of right lower limb, including hip: Secondary | ICD-10-CM | POA: Diagnosis not present

## 2018-03-08 DIAGNOSIS — C44729 Squamous cell carcinoma of skin of left lower limb, including hip: Secondary | ICD-10-CM | POA: Diagnosis not present

## 2018-03-08 DIAGNOSIS — C44629 Squamous cell carcinoma of skin of left upper limb, including shoulder: Secondary | ICD-10-CM | POA: Diagnosis not present

## 2018-03-08 DIAGNOSIS — C44622 Squamous cell carcinoma of skin of right upper limb, including shoulder: Secondary | ICD-10-CM | POA: Diagnosis not present

## 2018-03-08 DIAGNOSIS — L821 Other seborrheic keratosis: Secondary | ICD-10-CM | POA: Diagnosis not present

## 2018-03-11 ENCOUNTER — Ambulatory Visit (INDEPENDENT_AMBULATORY_CARE_PROVIDER_SITE_OTHER): Payer: Medicare Other | Admitting: Psychology

## 2018-03-11 DIAGNOSIS — F331 Major depressive disorder, recurrent, moderate: Secondary | ICD-10-CM

## 2018-03-12 ENCOUNTER — Telehealth: Payer: Self-pay | Admitting: *Deleted

## 2018-03-12 NOTE — Telephone Encounter (Signed)
Received Dermatopathology Report results from [facility]; forwarded to provider/SLS 06/28

## 2018-03-15 DIAGNOSIS — M7062 Trochanteric bursitis, left hip: Secondary | ICD-10-CM | POA: Diagnosis not present

## 2018-03-15 DIAGNOSIS — M1612 Unilateral primary osteoarthritis, left hip: Secondary | ICD-10-CM | POA: Diagnosis not present

## 2018-03-15 DIAGNOSIS — M7061 Trochanteric bursitis, right hip: Secondary | ICD-10-CM | POA: Diagnosis not present

## 2018-03-17 DIAGNOSIS — R2681 Unsteadiness on feet: Secondary | ICD-10-CM | POA: Diagnosis not present

## 2018-03-31 ENCOUNTER — Ambulatory Visit (INDEPENDENT_AMBULATORY_CARE_PROVIDER_SITE_OTHER): Payer: Medicare Other | Admitting: Family Medicine

## 2018-03-31 ENCOUNTER — Encounter: Payer: Self-pay | Admitting: Family Medicine

## 2018-03-31 VITALS — BP 110/78 | HR 72 | Resp 16 | Ht 66.0 in | Wt 174.6 lb

## 2018-03-31 DIAGNOSIS — L03116 Cellulitis of left lower limb: Secondary | ICD-10-CM

## 2018-03-31 DIAGNOSIS — R413 Other amnesia: Secondary | ICD-10-CM

## 2018-03-31 MED ORDER — DOXYCYCLINE HYCLATE 100 MG PO CAPS: 100.0000 mg | ORAL_CAPSULE | Freq: Two times a day (BID) | ORAL | 0 refills | Status: DC

## 2018-03-31 NOTE — Patient Instructions (Addendum)
It was good to see you today- take care and let me know if your leg does not seem to be getting better with the antibiotic and dressing plans that we made today It the wound seems to be too wet, leave it open to the air to dry for a while If you have any fever or other more serious symptoms please let me know   Assuming your MRI is ok, let me know if you would like me to refer you for formal memory testing

## 2018-03-31 NOTE — Progress Notes (Signed)
[place] at [facility] [address] [place], [place] [address] [phone]  Date:  03/31/2018   Name:  Mary Buck   DOB:  04-Jul-1936   MRN:  [id number]  PCP:  Darreld Mclean, MD    Chief Complaint: Sore on Leg (left leg, infection? redness)   History of Present Illness:  Mary Buck is a 82 y.o. very pleasant female patient who presents with the following:  Here today with concern of a sore on her leg  She saw Dr. Charlett Blake for this issue back in May-  Patient is in today for an acute visit for leg swelling. She had excisional biopsy on both legs with her dermatologist on 4/28 and she is healing slowly. She is noting some redness and warmth around both sites. Has significantly more swelling on left with some mild calf discomfort.  She had a bx to her left shin last month per her dermatologist- she is not sure if it might be infected.  It is sometimes more sore, and it can be red and will drain some yellowing fluid.  The area around the bx is red and tender  Asked her about a bone density scan- she thinks this was done in the last 1-2 years and that it was normal as far as she can recall    She is going to PT for her balance issues She also did see ENT- they have ordered an MRI of her brain which she plans to have done soon.  Admits that she is worried that she could have a brain tumor as there is a family history of same- she thinks she will feel better once her MRI is done  Her partner Deborah Chalk is worried about her memory; Mary Buck reports having a memory test done a few years ago in [place] and she was told that all was ok for her age  We decided to re-visit this issue after her MRI which will hopefully be normal   Patient Active Problem List   Diagnosis Date Noted  . Pedal edema 01/31/2018  . Cellulitis 01/31/2018  . Urinary hesitancy 01/31/2018  . Squamous cell skin cancer 12/16/2017  . Plantar fasciitis 07/07/2017  .  DDD (degenerative disc disease), lumbar 06/01/2017  . Fibromyalgia 06/01/2017  . Spinal stenosis at L4-L5 level 04/30/2017  . Peripheral neuropathy 04/30/2017  . Encounter for medication monitoring 03/05/2017  . Bladder cancer ([place]) 08/20/2015  . Cervical stenosis of spinal canal 07/27/2015  . Spinal stenosis of cervical region 07/27/2015  . Spondylolisthesis 07/27/2015  . Hypothyroid 07/05/2015  . Abdominal pain, acute, left upper quadrant 06/28/2015  . Basal cell carcinoma, face 06/28/2015  . Bilateral groin pain 06/28/2015  . Chronic pain 06/28/2015  . Fatigue 06/28/2015  . Hyperlipidemia 06/28/2015  . Pain in joints 06/28/2015  . Paresthesia of lower lip 06/28/2015  . Pulmonary nodule 06/28/2015  . Pustulosis palmaris et plantaris 06/28/2015  . Ulcer of nose 06/28/2015  . 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  . Mass of parotid gland 03/24/2014    Past Medical History:  Diagnosis Date  . Allergy   . Asthma   . Cancer ([place])   . Cataract   . DDD (degenerative disc disease), lumbar 06/01/2017  . Depression   . Heart murmur   . Hyperlipidemia   . Peripheral neuropathy 04/30/2017  . Thyroid disease     Past Surgical History:  Procedure Laterality Date  . bladder cancer  2013  . CARPAL  TUNNEL RELEASE  2015  . CHOLECYSTECTOMY    . TONSILLECTOMY AND ADENOIDECTOMY  1943    Social History   Tobacco Use  . Smoking status: Never Smoker  . Smokeless tobacco: Never Used  Substance Use Topics  . Alcohol use: No  . Drug use: No    Family History  Problem Relation Age of Onset  . Hyperlipidemia Father     Allergies  Allergen Reactions  . Cephalexin     Other reaction(s): unsure  . Contrast Media [Iodinated Diagnostic Agents]   . Latex     Other reaction(s): blistering, redness   . Mirtazapine     Other reaction(s): nausea  . Penicillins   . Sulfa Antibiotics   . Sulfur Other (See Comments)    Medication list has been reviewed and updated.  Current Outpatient  Medications on File Prior to Visit  Medication Sig Dispense Refill  . Calcium Carbonate-Vitamin D (LIQUID CALCIUM/VITAMIN D PO) Take by mouth.    . gabapentin (NEURONTIN) 300 MG capsule Take 1 capsule (300 mg total) by mouth 3 (three) times daily. 90 capsule 4  . lactobacillus acidophilus (BACID) TABS tablet Take 1 tablet by mouth daily.     . lansoprazole (PREVACID) 30 MG capsule Take 1 capsule (30 mg total) by mouth 2 (two) times daily before a meal. 180 capsule 3  . levothyroxine (SYNTHROID, LEVOTHROID) 88 MCG tablet Take 1 tablet (88 mcg total) by mouth daily before breakfast. 90 tablet 3  . simvastatin (ZOCOR) 20 MG tablet Take 1 tablet (20 mg total) by mouth daily. 90 tablet 3  . traMADol (ULTRAM) 50 MG tablet Take 1 tablet (50 mg total) by mouth 2 (two) times daily. 60 tablet 2   No current facility-administered medications on file prior to visit.     Review of Systems:  As per HPI- otherwise negative.   Physical Examination: Vitals:   03/31/18 1408  BP: 110/78  Pulse: 72  Resp: 16  SpO2: 96%   Vitals:   03/31/18 1408  Weight: 174 lb 9.6 oz (79.2 kg)  Height: 5\' 6"  (1.676 m)   Body mass index is 28.18 kg/m. Ideal Body Weight: Weight in (lb) to have BMI = 25: 154.6  GEN: WDWN, NAD, Non-toxic, A & O x 3, looks well, overweight HEENT: Atraumatic, Normocephalic. Neck supple. No masses, No LAD. Ears and Nose: No external deformity. CV: RRR, No M/G/R. No JVD. No thrill. No extra heart sounds. PULM: CTA B, no wheezes, crackles, rhonchi. No retractions. No resp. distress. No accessory muscle use. EXTR: No c/c/e NEURO Normal gait.  PSYCH: Normally interactive. Conversant. Not depressed or anxious appearing.  Calm demeanor.  Left anterior shin: she has 2 adjacent bx sites with some surrounding mild erythema, warmth and tenderness  No fluctuance or pus/ abscess noted    Assessment and Plan: Cellulitis of left leg - Plan: doxycycline (VIBRAMYCIN) 100 MG capsule  Memory  difficulties  Treat leg cellulitis with doxycycline  Showed pt how to dress wound with vaseline gauze and cobain Discussed her memory issues- she will have her MRI soon.  If normal and if she continues to be concerned will refer to neurology for evel   Signed Lamar Blinks, MD

## 2018-04-01 ENCOUNTER — Ambulatory Visit: Payer: Medicare Other | Admitting: Psychology

## 2018-04-05 DIAGNOSIS — R269 Unspecified abnormalities of gait and mobility: Secondary | ICD-10-CM | POA: Diagnosis not present

## 2018-04-05 DIAGNOSIS — R2681 Unsteadiness on feet: Secondary | ICD-10-CM | POA: Diagnosis not present

## 2018-04-05 DIAGNOSIS — G3189 Other specified degenerative diseases of nervous system: Secondary | ICD-10-CM | POA: Diagnosis not present

## 2018-04-05 DIAGNOSIS — I6782 Cerebral ischemia: Secondary | ICD-10-CM | POA: Diagnosis not present

## 2018-04-07 DIAGNOSIS — C679 Malignant neoplasm of bladder, unspecified: Secondary | ICD-10-CM | POA: Diagnosis not present

## 2018-04-15 DIAGNOSIS — R2681 Unsteadiness on feet: Secondary | ICD-10-CM | POA: Diagnosis not present

## 2018-04-19 ENCOUNTER — Other Ambulatory Visit: Payer: Self-pay | Admitting: Family Medicine

## 2018-04-19 DIAGNOSIS — Z1231 Encounter for screening mammogram for malignant neoplasm of breast: Secondary | ICD-10-CM

## 2018-04-19 DIAGNOSIS — R2681 Unsteadiness on feet: Secondary | ICD-10-CM | POA: Diagnosis not present

## 2018-04-20 ENCOUNTER — Ambulatory Visit (INDEPENDENT_AMBULATORY_CARE_PROVIDER_SITE_OTHER): Payer: Medicare Other | Admitting: Psychology

## 2018-04-20 DIAGNOSIS — F331 Major depressive disorder, recurrent, moderate: Secondary | ICD-10-CM

## 2018-04-30 DIAGNOSIS — R2681 Unsteadiness on feet: Secondary | ICD-10-CM | POA: Diagnosis not present

## 2018-05-11 DIAGNOSIS — M48061 Spinal stenosis, lumbar region without neurogenic claudication: Secondary | ICD-10-CM | POA: Diagnosis not present

## 2018-05-11 DIAGNOSIS — M5136 Other intervertebral disc degeneration, lumbar region: Secondary | ICD-10-CM | POA: Diagnosis not present

## 2018-05-11 DIAGNOSIS — M797 Fibromyalgia: Secondary | ICD-10-CM | POA: Diagnosis not present

## 2018-05-11 DIAGNOSIS — M4802 Spinal stenosis, cervical region: Secondary | ICD-10-CM | POA: Diagnosis not present

## 2018-05-11 DIAGNOSIS — M4316 Spondylolisthesis, lumbar region: Secondary | ICD-10-CM | POA: Diagnosis not present

## 2018-05-12 ENCOUNTER — Other Ambulatory Visit: Payer: Self-pay | Admitting: *Deleted

## 2018-05-12 ENCOUNTER — Telehealth: Payer: Self-pay | Admitting: Family Medicine

## 2018-05-12 ENCOUNTER — Telehealth: Payer: Self-pay | Admitting: *Deleted

## 2018-05-12 DIAGNOSIS — M4125 Other idiopathic scoliosis, thoracolumbar region: Secondary | ICD-10-CM

## 2018-05-12 DIAGNOSIS — C44722 Squamous cell carcinoma of skin of right lower limb, including hip: Secondary | ICD-10-CM | POA: Diagnosis not present

## 2018-05-12 DIAGNOSIS — Z85828 Personal history of other malignant neoplasm of skin: Secondary | ICD-10-CM | POA: Diagnosis not present

## 2018-05-12 DIAGNOSIS — C44729 Squamous cell carcinoma of skin of left lower limb, including hip: Secondary | ICD-10-CM | POA: Diagnosis not present

## 2018-05-12 DIAGNOSIS — L309 Dermatitis, unspecified: Secondary | ICD-10-CM | POA: Diagnosis not present

## 2018-05-12 DIAGNOSIS — D485 Neoplasm of uncertain behavior of skin: Secondary | ICD-10-CM | POA: Diagnosis not present

## 2018-05-12 MED ORDER — SIMVASTATIN 20 MG PO TABS: 20.0000 mg | ORAL_TABLET | Freq: Every day | ORAL | 0 refills | Status: DC

## 2018-05-12 NOTE — Telephone Encounter (Signed)
Copied from [place] [id number]. Topic: Referral - Request >> May 12, 2018 12:44 PM Carolyn Stare wrote:  Pt req a referral to see someone for scoliosis pain   [facility]

## 2018-05-12 NOTE — Telephone Encounter (Signed)
Copied from [place] [id number]. Topic: Quick Communication - Rx Refill/Question >> May 12, 2018 12:48 PM Carolyn Stare wrote: Medication: simvastatin (ZOCOR) 20 MG tablet   Preferred Pharmacy [facility]    Agent: Please be advised that RX refills may take up to 3 business days. We ask that you follow-up with your pharmacy.

## 2018-05-13 ENCOUNTER — Ambulatory Visit (INDEPENDENT_AMBULATORY_CARE_PROVIDER_SITE_OTHER): Payer: Medicare Other | Admitting: Psychology

## 2018-05-13 DIAGNOSIS — F331 Major depressive disorder, recurrent, moderate: Secondary | ICD-10-CM | POA: Diagnosis not present

## 2018-05-25 ENCOUNTER — Encounter: Payer: Self-pay | Admitting: Radiation Oncology

## 2018-05-25 NOTE — Progress Notes (Signed)
[place] at [facility] [address], [place], [place] [address] [phone]  Date:  05/26/2018   Name:  Mary Buck   DOB:  [birth year]   MRN:  [id number]  PCP:  Darreld Mclean, MD    Chief Complaint: Medication Management (tramadol-vs patch, possible reaction from antibiotic, abdominal pain, diarrhea, breaking out in hives, redness, sores under both breast) and Flu Vaccine ([facility])   History of Present Illness:  Mary Buck is a 82 y.o. very pleasant female patient who presents with the following:  Following up today History of peripheral neuropathy, bladder cancer, neck and back pain/ spinal stenosis, hyperlipidemia, hypothyroidism  Lab Results  Component Value Date   TSH 0.91 08/03/2017   Last seen here in July when she had some cellulitis on her legs following a bx per derm She has recently seen urology- in July- and had cystoscopy: Placed in the lithotomy position and under local anesthesia #17 panendoscope was introduced. The urethra is extremely tight, difficult to advance the scope. Urethral dilation is therefore accomplished. Cystoscopic exam demonstrates significant trabeculation, indicative of outlet obstruction. There is no cystocele. Reflexes clear bilaterally. Bladder mucosa is normal. Bladder washings are obtained. Further work-up of upper tracts etc. pending those results. All questions addressed. 28-month surveillance, perhaps 8, then 12 months thereafter. Greater than 50% of our 25-minute visit was spent face-to-face consultation regarding surveillance, prognosis, pathology reports etc.  She is undergoing PT to work on her balance  She also is seeing pain management though [facility], Cathlean Cower, PA-C:  Mackenna was seen today for hip pain, leg pain, knee pain and foot pain. Diagnoses and all orders for this visit: DDD (degenerative disc disease), lumbar - buprenorphine (BUTRANS) 5 mcg/hour  patch; Place 1 patch onto the skin every 7 days. Spondylolisthesis, lumbar region Cervical stenosis of spinal canal Fibromyalgia Spinal stenosis at L4-L5 level She is willing to consider Butrans rather than tramadol for her pain. She may be a candidate for SCS. She would like to try the medication first. We discussed the potential side effects associated with the prescribed medications. The patient was advised to use the medications exactly as prescribed.  Goals of pharmacological management were discussed and include improved pain control, function and quality of life. Disruption of the current medication regimen will be detrimental to maintaining these goals. Dr. Wilford Grist concurs with the plan and deems that her opioid medications remain medically appropriate.   Flu: pt will get at drug store [facility]- pt had thought this was done 1-2 years ago but I don't have a record of this for her   Sukhman notes that the Butrans patch did not react well with her so she went back to tramadol We did an antibiotic for cellulitis in July and she then she got Cipro from her urologist on 8/1- she is not sure if this may have caused her to have GI symptoms. Her history is vague, but she reports intermittent diarrhea and epigastric pain for the last month +.  Tums may help No vomiting She was having significant abd pain ?4 days ago but this is now gone.    She is going to have radiation therapy for the skin cancer on her legs coming up- she has too widespread an area of cancer for resection She is having a consultation with radiation oncology next week  Wt Readings from Last 3 Encounters:  05/26/18 172 lb (78 kg)  03/31/18 174  lb 9.6 oz (79.2 kg)  03/01/18 172 lb (78 kg)   Her GI also put her on misoprostol which seems to be helping her She notes that her skin is "broken out all over," which seems to be a few tiny patches of eczema vs psoriasis on her arms   Patient Active Problem List   Diagnosis Date  Noted  . Pedal edema 01/31/2018  . Cellulitis 01/31/2018  . Urinary hesitancy 01/31/2018  . Squamous cell skin cancer 12/16/2017  . Plantar fasciitis 07/07/2017  . DDD (degenerative disc disease), lumbar 06/01/2017  . Fibromyalgia 06/01/2017  . Spinal stenosis at L4-L5 level 04/30/2017  . Peripheral neuropathy 04/30/2017  . Encounter for medication monitoring 03/05/2017  . Bladder cancer ([place]) 08/20/2015  . Cervical stenosis of spinal canal 07/27/2015  . Spinal stenosis of cervical region 07/27/2015  . Spondylolisthesis 07/27/2015  . Hypothyroid 07/05/2015  . Abdominal pain, acute, left upper quadrant 06/28/2015  . Basal cell carcinoma, face 06/28/2015  . Bilateral groin pain 06/28/2015  . Chronic pain 06/28/2015  . Fatigue 06/28/2015  . Hyperlipidemia 06/28/2015  . Pain in joints 06/28/2015  . Paresthesia of lower lip 06/28/2015  . Pulmonary nodule 06/28/2015  . Pustulosis palmaris et plantaris 06/28/2015  . Ulcer of nose 06/28/2015  . 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  . Mass of parotid gland 03/24/2014    Past Medical History:  Diagnosis Date  . Allergy   . Asthma   . Cancer ([place])   . Cataract   . DDD (degenerative disc disease), lumbar 06/01/2017  . Depression   . Heart murmur   . Hyperlipidemia   . Peripheral neuropathy 04/30/2017  . Thyroid disease     Past Surgical History:  Procedure Laterality Date  . bladder cancer  2013  . CARPAL TUNNEL RELEASE  2015  . CHOLECYSTECTOMY    . TONSILLECTOMY AND ADENOIDECTOMY  1943    Social History   Tobacco Use  . Smoking status: Never Smoker  . Smokeless tobacco: Never Used  Substance Use Topics  . Alcohol use: No  . Drug use: No    Family History  Problem Relation Age of Onset  . Hyperlipidemia Father     Allergies  Allergen Reactions  . Cephalexin     Other reaction(s): unsure  . Contrast Media [Iodinated Diagnostic Agents]   . Latex     Other reaction(s): blistering, redness   . Mirtazapine     Other  reaction(s): nausea  . Penicillins   . Sulfa Antibiotics   . Sulfur Other (See Comments)    Medication list has been reviewed and updated.  Current Outpatient Medications on File Prior to Visit  Medication Sig Dispense Refill  . Calcium Carbonate-Vitamin D (LIQUID CALCIUM/VITAMIN D PO) Take by mouth.    . gabapentin (NEURONTIN) 300 MG capsule Take 1 capsule (300 mg total) by mouth 3 (three) times daily. 90 capsule 4  . lactobacillus acidophilus (BACID) TABS tablet Take 1 tablet by mouth daily.     . lansoprazole (PREVACID) 30 MG capsule Take 1 capsule (30 mg total) by mouth 2 (two) times daily before a meal. 180 capsule 3  . levothyroxine (SYNTHROID, LEVOTHROID) 88 MCG tablet Take 1 tablet (88 mcg total) by mouth daily before breakfast. 90 tablet 3  . simvastatin (ZOCOR) 20 MG tablet Take 1 tablet (20 mg total) by mouth daily. 90 tablet 0  . traMADol (ULTRAM) 50 MG tablet Take 1 tablet (50 mg total) by mouth 2 (two) times daily. 60 tablet 2  .  buprenorphine (BUTRANS - DOSED MCG/HR) 5 MCG/HR PTWK patch APP 1 PA EXT TO THE SKIN Q 7 DAYS  1   No current facility-administered medications on file prior to visit.     Review of Systems:  As per HPI- otherwise negative. No fever or chills Accompanied by her partner Carmie Kanner who contributes to the history today    Physical Examination: Vitals:   05/26/18 0919  BP: 126/68  Pulse: 75  Resp: 16  Temp: 98.2 F (36.8 C)  SpO2: 97%   Vitals:   05/26/18 0919  Weight: 172 lb (78 kg)  Height: 5\' 6"  (1.676 m)   Body mass index is 27.76 kg/m. Ideal Body Weight: Weight in (lb) to have BMI = 25: 154.6  GEN: WDWN, NAD, Non-toxic, A & O x 3, overweight, looks well  HEENT: Atraumatic, Normocephalic. Neck supple. No masses, No LAD. Bilateral TM wnl, oropharynx normal.  PEERL,EOMI.   Ears and Nose: No external deformity. CV: RRR, No M/G/R. No JVD. No thrill. No extra heart sounds. PULM: CTA B, no wheezes, crackles, rhonchi. No retractions. No  resp. distress. No accessory muscle use. ABD: S, NT, ND, +BS. No rebound. No HSM.  Belly is benign at this time  EXTR: No c/c/e NEURO Normal gait.  PSYCH: Normally interactive. Conversant. Not depressed or anxious appearing.  Calm demeanor.  Few scattered, discrete scaly papules on her arms, c/w eczema or psoriasis  Assessment and Plan: Psoriasis - Plan: triamcinolone cream (KENALOG) 0.1 %  Generalized abdominal pain  Diarrhea, unspecified type  Memory difficulties  Chronic pain of both knees  Following up today Gave triamcinolone to use on her arms prn At this time her GI sx are ?resolved.  The history again is not very clear.  Offered labs, CT today but she declines. She is asked to seek care if she has severe pain again (like she described happened 4 days ago) Ok to use imodium prn Flu shot [facility]   Signed Lamar Blinks, MD

## 2018-05-26 ENCOUNTER — Ambulatory Visit: Payer: Self-pay | Admitting: Family Medicine

## 2018-05-26 ENCOUNTER — Other Ambulatory Visit: Payer: Self-pay | Admitting: Family Medicine

## 2018-05-26 ENCOUNTER — Ambulatory Visit (INDEPENDENT_AMBULATORY_CARE_PROVIDER_SITE_OTHER): Payer: Medicare Other | Admitting: Psychology

## 2018-05-26 ENCOUNTER — Encounter: Payer: Self-pay | Admitting: Family Medicine

## 2018-05-26 ENCOUNTER — Ambulatory Visit (INDEPENDENT_AMBULATORY_CARE_PROVIDER_SITE_OTHER): Payer: Medicare Other | Admitting: Family Medicine

## 2018-05-26 VITALS — BP 126/68 | HR 75 | Temp 98.2°F | Resp 16 | Ht 66.0 in | Wt 172.0 lb

## 2018-05-26 DIAGNOSIS — R1084 Generalized abdominal pain: Secondary | ICD-10-CM | POA: Diagnosis not present

## 2018-05-26 DIAGNOSIS — G8929 Other chronic pain: Secondary | ICD-10-CM

## 2018-05-26 DIAGNOSIS — R413 Other amnesia: Secondary | ICD-10-CM | POA: Diagnosis not present

## 2018-05-26 DIAGNOSIS — M25562 Pain in left knee: Secondary | ICD-10-CM | POA: Diagnosis not present

## 2018-05-26 DIAGNOSIS — L409 Psoriasis, unspecified: Secondary | ICD-10-CM

## 2018-05-26 DIAGNOSIS — R197 Diarrhea, unspecified: Secondary | ICD-10-CM | POA: Diagnosis not present

## 2018-05-26 DIAGNOSIS — F331 Major depressive disorder, recurrent, moderate: Secondary | ICD-10-CM

## 2018-05-26 DIAGNOSIS — M25561 Pain in right knee: Secondary | ICD-10-CM | POA: Diagnosis not present

## 2018-05-26 MED ORDER — TRIAMCINOLONE ACETONIDE 0.1 % EX CREA: 1.0000 "application " | TOPICAL_CREAM | Freq: Two times a day (BID) | CUTANEOUS | 1 refills | Status: DC

## 2018-05-26 NOTE — Patient Instructions (Signed)
It was good to see you today- please do let me know or otherwise seek care if your belly pain comes back It is ok for you to try imodium if needed for diarrhea I gave you some triamcinolone to use for the dry scaly areas on your arm as needed

## 2018-05-27 ENCOUNTER — Other Ambulatory Visit: Payer: Self-pay | Admitting: Family Medicine

## 2018-05-27 ENCOUNTER — Ambulatory Visit (HOSPITAL_BASED_OUTPATIENT_CLINIC_OR_DEPARTMENT_OTHER)
Admission: RE | Admit: 2018-05-27 | Discharge: 2018-05-27 | Disposition: A | Discharge status: Home / Self Care | Payer: Medicare Other | Source: Ambulatory Visit | Attending: Family Medicine | Admitting: Family Medicine

## 2018-05-27 DIAGNOSIS — Z1231 Encounter for screening mammogram for malignant neoplasm of breast: Secondary | ICD-10-CM | POA: Diagnosis not present

## 2018-05-28 DIAGNOSIS — M4125 Other idiopathic scoliosis, thoracolumbar region: Secondary | ICD-10-CM | POA: Diagnosis not present

## 2018-05-28 DIAGNOSIS — R2681 Unsteadiness on feet: Secondary | ICD-10-CM | POA: Diagnosis not present

## 2018-05-31 DIAGNOSIS — Z23 Encounter for immunization: Secondary | ICD-10-CM | POA: Diagnosis not present

## 2018-05-31 NOTE — Progress Notes (Addendum)
Histology and Location of Primary Skin Cancer: Squamous cell carcinoma of skin of left lower limb.  Mary Buck presented with  -Many years ago she had a bad sunburn that cleared. -has psoriasis -Psoriasis was treated in [place] with light treatments.  Went away but kept recurring.  Lower Extremity Doppler 03/08/2018: No evidence of DVT within the left lower extremity.   Past/Anticipated interventions by patient's surgeon/dermatologist for current problematic lesion, if any:  Dr. Ronnald Ramp 05/12/2018 -Erythematous, scaly, crusted papule or plaque located on her left mid medial anterior tibial. Diagnosis: Squamous Cell Carcinoma Medication Plan: Appears that best option at this time would be radiation.  -Erythematous, scaly, crusted papule or plaque located on her left mid central anterior tibial. Diagnosis: Squamous Cell Carcinoma Medication Plan: appears that best option at this time would be radiation.  -Erythematous, scaly, crusted papule or plaque located on her left inferior central anterior tibial Diagnosis: Squamous Cell Carcinoma Medication plan: appears that best option at this time would be radiation.  -8 mm tender erythematous papule located on her right superior lateral anterior tibial. Diagnosis: rule out squamous cell carcinoma Plan: biopsy.  -Please refer the patient to Dr. Lisbeth Renshaw for radiation therapy for clustered squamous cells on left lower leg.  Past skin cancers, if any:  1) Location/Histology/Intervention: bilateral legs  2) Location/Histology/Intervention:   3) Location/Histology/Intervention:   History of Blistering sunburns, if any: Yes, many years ago- teenager  BP (!) 143/84 (BP Location: Right Arm, Patient Position: Sitting)   Pulse 72   Temp 98.7 F (37.1 C) (Oral)   Resp 18   Ht 5\' 6"  (1.676 m)   Wt 173 lb 4 oz (78.6 kg)   SpO2 97%   BMI 27.96 kg/m    Wt Readings from Last 3 Encounters:  06/01/18 173 lb 4 oz (78.6 kg)  05/26/18 172 lb  (78 kg)  03/31/18 174 lb 9.6 oz (79.2 kg)   SAFETY ISSUES:  Prior radiation? No  Pacemaker/ICD? No  Possible current pregnancy? No  Is the patient on methotrexate? No  Current Complaints / other details:

## 2018-06-01 ENCOUNTER — Ambulatory Visit
Admission: RE | Admit: 2018-06-01 | Discharge: 2018-06-01 | Disposition: A | Discharge status: Home / Self Care | Payer: Medicare Other | Source: Ambulatory Visit | Attending: Radiation Oncology | Admitting: Radiation Oncology

## 2018-06-01 ENCOUNTER — Encounter: Payer: Self-pay | Admitting: Radiation Oncology

## 2018-06-01 ENCOUNTER — Other Ambulatory Visit: Payer: Self-pay

## 2018-06-01 ENCOUNTER — Telehealth: Payer: Self-pay | Admitting: Radiation Oncology

## 2018-06-01 VITALS — BP 143/84 | HR 72 | Temp 98.7°F | Resp 18 | Ht 66.0 in | Wt 173.2 lb

## 2018-06-01 DIAGNOSIS — Z880 Allergy status to penicillin: Secondary | ICD-10-CM | POA: Diagnosis not present

## 2018-06-01 DIAGNOSIS — M5136 Other intervertebral disc degeneration, lumbar region: Secondary | ICD-10-CM | POA: Diagnosis not present

## 2018-06-01 DIAGNOSIS — Z882 Allergy status to sulfonamides status: Secondary | ICD-10-CM | POA: Diagnosis not present

## 2018-06-01 DIAGNOSIS — E079 Disorder of thyroid, unspecified: Secondary | ICD-10-CM | POA: Diagnosis not present

## 2018-06-01 DIAGNOSIS — Z87891 Personal history of nicotine dependence: Secondary | ICD-10-CM | POA: Insufficient documentation

## 2018-06-01 DIAGNOSIS — G629 Polyneuropathy, unspecified: Secondary | ICD-10-CM | POA: Insufficient documentation

## 2018-06-01 DIAGNOSIS — Z881 Allergy status to other antibiotic agents status: Secondary | ICD-10-CM | POA: Insufficient documentation

## 2018-06-01 DIAGNOSIS — Z79899 Other long term (current) drug therapy: Secondary | ICD-10-CM | POA: Diagnosis not present

## 2018-06-01 DIAGNOSIS — C44729 Squamous cell carcinoma of skin of left lower limb, including hip: Secondary | ICD-10-CM | POA: Insufficient documentation

## 2018-06-01 DIAGNOSIS — Z9889 Other specified postprocedural states: Secondary | ICD-10-CM | POA: Diagnosis not present

## 2018-06-01 DIAGNOSIS — C4492 Squamous cell carcinoma of skin, unspecified: Secondary | ICD-10-CM

## 2018-06-01 DIAGNOSIS — E785 Hyperlipidemia, unspecified: Secondary | ICD-10-CM | POA: Diagnosis not present

## 2018-06-01 DIAGNOSIS — J45909 Unspecified asthma, uncomplicated: Secondary | ICD-10-CM | POA: Insufficient documentation

## 2018-06-01 DIAGNOSIS — Z85828 Personal history of other malignant neoplasm of skin: Secondary | ICD-10-CM | POA: Diagnosis not present

## 2018-06-01 NOTE — Telephone Encounter (Signed)
I called the patient after receiving a message from Dr. Ronnald Ramp' office. He would only like [place] to treat the LLE. I had to leave her a message regarding this. Dr. Lisbeth Renshaw is aware also.

## 2018-06-01 NOTE — Progress Notes (Signed)
Radiation Oncology         [phone] ________________________________  Name: Mary Buck        MRN: [id number]  Date of Service: 06/01/2018 DOB: 02/20/1936  JK:DTOIZTI, Gay Filler, MD  Danella Sensing, MD     REFERRING PHYSICIAN: Danella Sensing, MD   DIAGNOSIS: The encounter diagnosis was Squamous cell skin cancer.   HISTORY OF PRESENT ILLNESS: Mary Buck is a 82 y.o. female seen at the request of Dr. Ronnald Ramp for a history of multifocal skin cancers. The patient has a history of squamous cell carcinoma in the LLE as well as of the RLE. Her biopsy history is as follows:  05/28/17:  SCC Left inferior lateral anterior tibial site x 3, treated with electrodissection/cautery (ED&C)  08/12/17:  SCC Left inferior anterior tibial and left inferior posterior tibial (single lesion) treated with Moh's procedure  12/10/17:  SCC left inferior central anterior tibial treated with ED&C SCC left distal central dorsal hand treated with ED&C SCC right inferior lateral anterior tibial treated with ED&C SCC right superior lateral anterior tibial treated with ED&C  03/08/18:  SCC left inferior lateral anterior tibial treated with ED&C SCC left inferior lateral anterior tibial treated with ED&C SCC right superior lateral anterior tibial treated with ED&C  05/13/18:  SCC right lateral anterior tibial treated with [facility]  The patient has met with Dr. Ronnald Ramp and given the persistent disease despite prior Mohs' procedure in the LLE she was encouraged to meet with Dr. Lisbeth Renshaw. She reports she has previously used creams in the site that did not respond. She comes today to discuss the options of radiotherapy.    PREVIOUS RADIATION THERAPY: No   PAST MEDICAL HISTORY:  Past Medical History:  Diagnosis Date  . Allergy   . Asthma   . Cancer ([place])   . Cataract   . DDD (degenerative disc disease), lumbar 06/01/2017  . Depression   . Heart murmur   . Hyperlipidemia   . Peripheral neuropathy 04/30/2017    . Thyroid disease        PAST SURGICAL HISTORY: Past Surgical History:  Procedure Laterality Date  . bladder cancer  2013  . CARPAL TUNNEL RELEASE  2015  . CATARACT EXTRACTION Bilateral 2012  . CHOLECYSTECTOMY    . SALIVARY GLAND SURGERY Left 2015   benign  . TONSILLECTOMY AND ADENOIDECTOMY  1943     FAMILY HISTORY:  Family History  Problem Relation Age of Onset  . Hyperlipidemia Father   . Stroke Father      SOCIAL HISTORY:  reports that she quit smoking about 4 years ago. She has never used smokeless tobacco. She reports that she does not drink alcohol or use drugs. The patient is in a relationship and accompanied by her partner [place]. 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    ALLERGIES: Cephalexin; Contrast media [iodinated diagnostic agents]; Latex; Mirtazapine; Penicillins; Sulfa antibiotics; and Sulfur   MEDICATIONS:  Current Outpatient Medications  Medication Sig Dispense Refill  . buprenorphine (BUTRANS - DOSED MCG/HR) 5 MCG/HR PTWK patch APP 1 PA EXT TO THE SKIN Q 7 DAYS  1  . Calcium Carbonate-Vitamin D (LIQUID CALCIUM/VITAMIN D PO) Take by mouth.    . gabapentin (NEURONTIN) 300 MG capsule Take 1 capsule (300 mg total) by mouth 3 (three) times daily. 90 capsule 4  . lactobacillus acidophilus (BACID) TABS tablet Take 1 tablet by mouth daily.     . lansoprazole (PREVACID) 30 MG capsule Take 1 capsule (  30 mg total) by mouth 2 (two) times daily before a meal. 180 capsule 3  . levothyroxine (SYNTHROID, LEVOTHROID) 88 MCG tablet TAKE 1 TABLET(88 MCG) BY MOUTH DAILY BEFORE BREAKFAST 90 tablet 1  . simvastatin (ZOCOR) 20 MG tablet TAKE 1 TABLET BY MOUTH DAILY 90 tablet 1  . traMADol (ULTRAM) 50 MG tablet Take 1 tablet (50 mg total) by mouth 2 (two) times daily. 60 tablet 2  . triamcinolone cream (KENALOG) 0.1 % Apply 1 application topically 2 (two) times daily. Use as needed for spots on your skin 45 g 1   No current facility-administered medications  for this encounter.      REVIEW OF SYSTEMS: On review of systems, the patient reports that she is doing well overall. She denies any chest pain, shortness of breath, cough, fevers, chills, night sweats, unintended weight changes. She reports peristent healing of her RLE biopsy site. She denies any bowel or bladder disturbances, and denies abdominal pain, nausea or vomiting. She denies any new musculoskeletal or joint aches or pains. A complete review of systems is obtained and is otherwise negative.     PHYSICAL EXAM:  Wt Readings from Last 3 Encounters:  06/01/18 173 lb 4 oz (78.6 kg)  05/26/18 172 lb (78 kg)  03/31/18 174 lb 9.6 oz (79.2 kg)   Temp Readings from Last 3 Encounters:  06/01/18 98.7 F (37.1 C) (Oral)  05/26/18 98.2 F (36.8 C) (Oral)  03/01/18 98.1 F (36.7 C) (Oral)   BP Readings from Last 3 Encounters:  06/01/18 (!) 143/84  05/26/18 126/68  03/31/18 110/78   Pulse Readings from Last 3 Encounters:  06/01/18 72  05/26/18 75  03/31/18 72   Pain Assessment Pain Score: 0-No pain/10  In general this is a well appearing caucasian female in no acute distress. She is alert and oriented x4 and appropriate throughout the examination. HEENT reveals that the patient is normocephalic, atraumatic. EOMs are intact. Cardiopulmonary assessment is negative for acute distress and she exhibits normal effort. Lower extremities are evaluated. The left has an area of about 8 x 6 cm of erythema and multiple keratotic lesions. These lesions are all subcentimeter. She has a 1.5 cm prior biopsy site with granulation and fibrin in the wound bed laterally of the right tibial surface. No edema is noted. Multiple varicosities are noted of bilateral lower extremities.    ECOG = 0  0 - Asymptomatic (Fully active, able to carry on all predisease activities without restriction)  1 - Symptomatic but completely ambulatory (Restricted in physically strenuous activity but ambulatory and able to  carry out work of a light or sedentary nature. For example, light housework, office work)  2 - Symptomatic, <50% in bed during the day (Ambulatory and capable of all self care but unable to carry out any work activities. Up and about more than 50% of waking hours)  3 - Symptomatic, >50% in bed, but not bedbound (Capable of only limited self-care, confined to bed or chair 50% or more of waking hours)  4 - Bedbound (Completely disabled. Cannot carry on any self-care. Totally confined to bed or chair)  5 - Death   Eustace Pen MM, Creech RH, Tormey DC, et al. [phone]). "Toxicity and response criteria of the [facility]". [place] Oncol. 5 (6): 649-55    LABORATORY DATA:  Lab Results  Component Value Date   WBC 5.2 01/28/2018   HGB 11.8 (L) 01/28/2018   HCT 35.7 (L) 01/28/2018   MCV  92.5 01/28/2018   PLT 228.0 01/28/2018   Lab Results  Component Value Date   NA 142 08/03/2017   K 4.1 08/03/2017   CL 106 08/03/2017   CO2 31 08/03/2017   Lab Results  Component Value Date   ALT 16 08/03/2017   AST 20 08/03/2017   ALKPHOS 74 08/03/2017   BILITOT 0.4 08/03/2017      RADIOGRAPHY: Mm 3d Screen Breast Bilateral  Result Date: 05/27/2018 CLINICAL DATA:  Screening. EXAM: DIGITAL SCREENING BILATERAL MAMMOGRAM WITH TOMO AND CAD COMPARISON:  Previous exam(s). ACR Breast Density Category c: The breast tissue is heterogeneously dense, which may obscure small masses. FINDINGS: There are no findings suspicious for malignancy. Images were processed with CAD. IMPRESSION: No mammographic evidence of malignancy. A result letter of this screening mammogram will be mailed directly to the patient. RECOMMENDATION: Screening mammogram in one year. (Code:SM-B-01Y) BI-RADS CATEGORY  1: Negative. Electronically Signed   By: Everlean Alstrom M.D.   On: 05/27/2018 15:21       IMPRESSION/PLAN: 1. Recurrent Squamous Cell Carcinoma of the LLE. Dr. Lisbeth Renshaw discusses the pathology findings and  reviews the nature of recurrent skin cancers s/p moh's procedures. He discusses the utility and approach that can be used with radiotherapy. We discussed the risks, benefits, short, and long term effects of radiotherapy, and the patient is interested in proceeding. Dr. Lisbeth Renshaw discusses the delivery and logistics of radiotherapy and anticipates a course of 5-6 weeks of radiotherapy. She will have a clinical set up and our staff will coordinate for this on 06/14/18 at 12:00. We also have a call into Dr. Ronnald Ramp to discuss whether he feels like there would be plans for more treatment of her RLE, or if he would like [place] to consider treating this site as well. We will make definitively plans after speaking with him.   In a visit lasting 60 minutes, greater than 50% of the time was spent face to face discussing her case, and coordinating the patient's care.   The above documentation reflects my direct findings during this shared patient visit. Please see the separate note by Dr. Lisbeth Renshaw on this date for the remainder of the patient's plan of care.    Carola Rhine, PAC

## 2018-06-02 ENCOUNTER — Encounter: Payer: Self-pay | Admitting: General Practice

## 2018-06-02 DIAGNOSIS — R2681 Unsteadiness on feet: Secondary | ICD-10-CM | POA: Diagnosis not present

## 2018-06-02 DIAGNOSIS — M4125 Other idiopathic scoliosis, thoracolumbar region: Secondary | ICD-10-CM | POA: Diagnosis not present

## 2018-06-02 NOTE — Progress Notes (Signed)
[place] Psychosocial Distress Screening Clinical Social Work  Clinical Social Work was referred by distress screening protocol.  The patient scored a 5 on the Psychosocial Distress Thermometer which indicates moderate distress. Clinical Social Worker contacted patient by phone to assess for distress and other psychosocial needs. No answer, left VM describing Support Center resources, contact information and encouraging patient to call back for resources as needed.    ON[facility] DISTRESS SCREENING 06/01/2018  Screening Type Initial Screening  Distress experienced in past week (1-10) 5  Emotional problem type Depression;Nervousness/Anxiety  Spiritual/Religous concerns type Facing my mortality;Loss of sense of purpose  Information Concerns Type Lack of info about diagnosis;Lack of info about treatment  Physical Problem type Pain;Sleep/insomnia;Changes in urination;Tingling hands/feet;Swollen arms/legs;Skin dry/itchy  Other [phone]    Clinical Social Worker follow up needed: No.  If yes, follow up plan:  Beverely Pace, [place], LCSW Clinical Social Worker Phone:  [phone]

## 2018-06-04 ENCOUNTER — Telehealth: Payer: Self-pay | Admitting: *Deleted

## 2018-06-04 NOTE — Telephone Encounter (Signed)
Received Physician Orders from [facility]/PT Plan Certification; forwarded to provider/SLS 09/20

## 2018-06-07 DIAGNOSIS — H43393 Other vitreous opacities, bilateral: Secondary | ICD-10-CM | POA: Diagnosis not present

## 2018-06-07 DIAGNOSIS — H0288B Meibomian gland dysfunction left eye, upper and lower eyelids: Secondary | ICD-10-CM | POA: Diagnosis not present

## 2018-06-07 DIAGNOSIS — H0288A Meibomian gland dysfunction right eye, upper and lower eyelids: Secondary | ICD-10-CM | POA: Diagnosis not present

## 2018-06-07 DIAGNOSIS — H0100B Unspecified blepharitis left eye, upper and lower eyelids: Secondary | ICD-10-CM | POA: Diagnosis not present

## 2018-06-07 DIAGNOSIS — H0100A Unspecified blepharitis right eye, upper and lower eyelids: Secondary | ICD-10-CM | POA: Diagnosis not present

## 2018-06-07 DIAGNOSIS — H524 Presbyopia: Secondary | ICD-10-CM | POA: Diagnosis not present

## 2018-06-07 DIAGNOSIS — H52203 Unspecified astigmatism, bilateral: Secondary | ICD-10-CM | POA: Diagnosis not present

## 2018-06-07 DIAGNOSIS — H04123 Dry eye syndrome of bilateral lacrimal glands: Secondary | ICD-10-CM | POA: Diagnosis not present

## 2018-06-07 DIAGNOSIS — Z961 Presence of intraocular lens: Secondary | ICD-10-CM | POA: Diagnosis not present

## 2018-06-08 DIAGNOSIS — M4125 Other idiopathic scoliosis, thoracolumbar region: Secondary | ICD-10-CM | POA: Diagnosis not present

## 2018-06-08 DIAGNOSIS — R2681 Unsteadiness on feet: Secondary | ICD-10-CM | POA: Diagnosis not present

## 2018-06-09 ENCOUNTER — Ambulatory Visit (INDEPENDENT_AMBULATORY_CARE_PROVIDER_SITE_OTHER): Payer: Medicare Other | Admitting: Psychology

## 2018-06-09 DIAGNOSIS — F331 Major depressive disorder, recurrent, moderate: Secondary | ICD-10-CM | POA: Diagnosis not present

## 2018-06-11 DIAGNOSIS — M5136 Other intervertebral disc degeneration, lumbar region: Secondary | ICD-10-CM | POA: Diagnosis not present

## 2018-06-11 DIAGNOSIS — M4316 Spondylolisthesis, lumbar region: Secondary | ICD-10-CM | POA: Diagnosis not present

## 2018-06-11 DIAGNOSIS — M48061 Spinal stenosis, lumbar region without neurogenic claudication: Secondary | ICD-10-CM | POA: Diagnosis not present

## 2018-06-14 ENCOUNTER — Ambulatory Visit: Payer: Medicare Other | Admitting: Radiation Oncology

## 2018-06-14 ENCOUNTER — Ambulatory Visit: Payer: Medicare Other

## 2018-06-15 ENCOUNTER — Ambulatory Visit: Admission: RE | Admit: 2018-06-15 | Payer: Medicare Other | Source: Ambulatory Visit | Admitting: Radiation Oncology

## 2018-06-15 ENCOUNTER — Ambulatory Visit
Admission: RE | Admit: 2018-06-15 | Discharge: 2018-06-15 | Disposition: A | Discharge status: Home / Self Care | Payer: Medicare Other | Source: Ambulatory Visit | Attending: Radiation Oncology | Admitting: Radiation Oncology

## 2018-06-15 ENCOUNTER — Ambulatory Visit: Payer: Medicare Other

## 2018-06-15 ENCOUNTER — Ambulatory Visit: Admission: RE | Admit: 2018-06-15 | Payer: Medicare Other | Source: Ambulatory Visit

## 2018-06-15 DIAGNOSIS — C4492 Squamous cell carcinoma of skin, unspecified: Secondary | ICD-10-CM | POA: Insufficient documentation

## 2018-06-15 DIAGNOSIS — C44729 Squamous cell carcinoma of skin of left lower limb, including hip: Secondary | ICD-10-CM

## 2018-06-16 ENCOUNTER — Ambulatory Visit: Payer: Medicare Other

## 2018-06-16 ENCOUNTER — Ambulatory Visit: Payer: Medicare Other | Admitting: Radiation Oncology

## 2018-06-16 DIAGNOSIS — M4125 Other idiopathic scoliosis, thoracolumbar region: Secondary | ICD-10-CM | POA: Diagnosis not present

## 2018-06-16 DIAGNOSIS — R2681 Unsteadiness on feet: Secondary | ICD-10-CM | POA: Diagnosis not present

## 2018-06-17 ENCOUNTER — Ambulatory Visit: Payer: Medicare Other

## 2018-06-17 ENCOUNTER — Ambulatory Visit: Payer: Medicare Other | Admitting: Radiation Oncology

## 2018-06-18 ENCOUNTER — Ambulatory Visit: Payer: Medicare Other

## 2018-06-21 ENCOUNTER — Ambulatory Visit: Payer: Medicare Other

## 2018-06-22 ENCOUNTER — Ambulatory Visit: Payer: Medicare Other

## 2018-06-23 ENCOUNTER — Ambulatory Visit: Admission: RE | Admit: 2018-06-23 | Payer: Medicare Other | Source: Ambulatory Visit

## 2018-06-23 ENCOUNTER — Ambulatory Visit
Admission: RE | Admit: 2018-06-23 | Discharge: 2018-06-23 | Disposition: A | Discharge status: Home / Self Care | Payer: Medicare Other | Source: Ambulatory Visit | Attending: Radiation Oncology | Admitting: Radiation Oncology

## 2018-06-23 DIAGNOSIS — C44729 Squamous cell carcinoma of skin of left lower limb, including hip: Secondary | ICD-10-CM | POA: Diagnosis not present

## 2018-06-23 DIAGNOSIS — C4492 Squamous cell carcinoma of skin, unspecified: Secondary | ICD-10-CM | POA: Diagnosis not present

## 2018-06-24 ENCOUNTER — Ambulatory Visit: Payer: Medicare Other

## 2018-06-25 ENCOUNTER — Ambulatory Visit: Payer: Medicare Other

## 2018-06-25 DIAGNOSIS — C4492 Squamous cell carcinoma of skin, unspecified: Secondary | ICD-10-CM | POA: Diagnosis not present

## 2018-06-25 DIAGNOSIS — C44729 Squamous cell carcinoma of skin of left lower limb, including hip: Secondary | ICD-10-CM | POA: Diagnosis not present

## 2018-06-28 ENCOUNTER — Ambulatory Visit: Payer: Medicare Other

## 2018-06-28 ENCOUNTER — Ambulatory Visit
Admission: RE | Admit: 2018-06-28 | Discharge: 2018-06-28 | Disposition: A | Discharge status: Home / Self Care | Payer: Medicare Other | Source: Ambulatory Visit | Attending: Radiation Oncology | Admitting: Radiation Oncology

## 2018-06-28 DIAGNOSIS — C44729 Squamous cell carcinoma of skin of left lower limb, including hip: Secondary | ICD-10-CM | POA: Diagnosis not present

## 2018-06-28 DIAGNOSIS — C4492 Squamous cell carcinoma of skin, unspecified: Secondary | ICD-10-CM

## 2018-06-28 MED ORDER — SONAFINE EX EMUL
1.0000 "application " | Freq: Once | CUTANEOUS | Status: AC
  Administered 2018-06-28: 1 via TOPICAL

## 2018-06-28 NOTE — Progress Notes (Signed)
Pt here for patient teaching.  Pt given Radiation and You booklet and Sonafine.  Reviewed areas of pertinence such as fatigue and skin changes . Pt able to give teach back of to pat skin,apply Sonafine bid and avoid applying anything to skin within 4 hours of treatment. Pt verbalizes understanding of information given and will contact nursing with any questions or concerns.     Mary Buck. Leonie Green, BSN

## 2018-06-29 ENCOUNTER — Ambulatory Visit: Payer: Medicare Other

## 2018-06-29 ENCOUNTER — Ambulatory Visit
Admission: RE | Admit: 2018-06-29 | Discharge: 2018-06-29 | Disposition: A | Discharge status: Home / Self Care | Payer: Medicare Other | Source: Ambulatory Visit | Attending: Radiation Oncology | Admitting: Radiation Oncology

## 2018-06-29 DIAGNOSIS — C4492 Squamous cell carcinoma of skin, unspecified: Secondary | ICD-10-CM | POA: Diagnosis not present

## 2018-06-29 DIAGNOSIS — C44729 Squamous cell carcinoma of skin of left lower limb, including hip: Secondary | ICD-10-CM | POA: Diagnosis not present

## 2018-06-30 ENCOUNTER — Ambulatory Visit
Admission: RE | Admit: 2018-06-30 | Discharge: 2018-06-30 | Disposition: A | Discharge status: Home / Self Care | Payer: Medicare Other | Source: Ambulatory Visit | Attending: Radiation Oncology | Admitting: Radiation Oncology

## 2018-06-30 ENCOUNTER — Ambulatory Visit: Payer: Medicare Other

## 2018-06-30 DIAGNOSIS — C4492 Squamous cell carcinoma of skin, unspecified: Secondary | ICD-10-CM | POA: Diagnosis not present

## 2018-06-30 DIAGNOSIS — C44729 Squamous cell carcinoma of skin of left lower limb, including hip: Secondary | ICD-10-CM | POA: Diagnosis not present

## 2018-07-01 ENCOUNTER — Ambulatory Visit: Payer: Medicare Other

## 2018-07-01 ENCOUNTER — Ambulatory Visit
Admission: RE | Admit: 2018-07-01 | Discharge: 2018-07-01 | Disposition: A | Discharge status: Home / Self Care | Payer: Medicare Other | Source: Ambulatory Visit | Attending: Radiation Oncology | Admitting: Radiation Oncology

## 2018-07-01 DIAGNOSIS — C4492 Squamous cell carcinoma of skin, unspecified: Secondary | ICD-10-CM | POA: Diagnosis not present

## 2018-07-01 DIAGNOSIS — C44729 Squamous cell carcinoma of skin of left lower limb, including hip: Secondary | ICD-10-CM | POA: Diagnosis not present

## 2018-07-02 ENCOUNTER — Ambulatory Visit
Admission: RE | Admit: 2018-07-02 | Discharge: 2018-07-02 | Disposition: A | Discharge status: Home / Self Care | Payer: Medicare Other | Source: Ambulatory Visit | Attending: Radiation Oncology | Admitting: Radiation Oncology

## 2018-07-02 ENCOUNTER — Ambulatory Visit: Payer: Medicare Other

## 2018-07-02 DIAGNOSIS — C44729 Squamous cell carcinoma of skin of left lower limb, including hip: Secondary | ICD-10-CM | POA: Diagnosis not present

## 2018-07-02 DIAGNOSIS — C4492 Squamous cell carcinoma of skin, unspecified: Secondary | ICD-10-CM | POA: Diagnosis not present

## 2018-07-05 ENCOUNTER — Ambulatory Visit: Payer: Medicare Other

## 2018-07-05 ENCOUNTER — Ambulatory Visit
Admission: RE | Admit: 2018-07-05 | Discharge: 2018-07-05 | Disposition: A | Discharge status: Home / Self Care | Payer: Medicare Other | Source: Ambulatory Visit | Attending: Radiation Oncology | Admitting: Radiation Oncology

## 2018-07-05 DIAGNOSIS — C44729 Squamous cell carcinoma of skin of left lower limb, including hip: Secondary | ICD-10-CM | POA: Diagnosis not present

## 2018-07-05 DIAGNOSIS — C4492 Squamous cell carcinoma of skin, unspecified: Secondary | ICD-10-CM | POA: Diagnosis not present

## 2018-07-06 ENCOUNTER — Ambulatory Visit
Admission: RE | Admit: 2018-07-06 | Discharge: 2018-07-06 | Disposition: A | Discharge status: Home / Self Care | Payer: Medicare Other | Source: Ambulatory Visit | Attending: Radiation Oncology | Admitting: Radiation Oncology

## 2018-07-06 ENCOUNTER — Ambulatory Visit: Payer: Medicare Other

## 2018-07-06 DIAGNOSIS — C44729 Squamous cell carcinoma of skin of left lower limb, including hip: Secondary | ICD-10-CM | POA: Diagnosis not present

## 2018-07-06 DIAGNOSIS — C4492 Squamous cell carcinoma of skin, unspecified: Secondary | ICD-10-CM | POA: Diagnosis not present

## 2018-07-07 ENCOUNTER — Ambulatory Visit
Admission: RE | Admit: 2018-07-07 | Discharge: 2018-07-07 | Disposition: A | Discharge status: Home / Self Care | Payer: Medicare Other | Source: Ambulatory Visit | Attending: Radiation Oncology | Admitting: Radiation Oncology

## 2018-07-07 ENCOUNTER — Ambulatory Visit: Payer: Medicare Other

## 2018-07-07 DIAGNOSIS — C44729 Squamous cell carcinoma of skin of left lower limb, including hip: Secondary | ICD-10-CM | POA: Diagnosis not present

## 2018-07-07 DIAGNOSIS — C4492 Squamous cell carcinoma of skin, unspecified: Secondary | ICD-10-CM | POA: Diagnosis not present

## 2018-07-08 ENCOUNTER — Ambulatory Visit
Admission: RE | Admit: 2018-07-08 | Discharge: 2018-07-08 | Disposition: A | Discharge status: Home / Self Care | Payer: Medicare Other | Source: Ambulatory Visit | Attending: Radiation Oncology | Admitting: Radiation Oncology

## 2018-07-08 ENCOUNTER — Ambulatory Visit: Payer: Medicare Other

## 2018-07-08 DIAGNOSIS — C4492 Squamous cell carcinoma of skin, unspecified: Secondary | ICD-10-CM | POA: Diagnosis not present

## 2018-07-08 DIAGNOSIS — C44729 Squamous cell carcinoma of skin of left lower limb, including hip: Secondary | ICD-10-CM | POA: Diagnosis not present

## 2018-07-09 ENCOUNTER — Ambulatory Visit
Admission: RE | Admit: 2018-07-09 | Discharge: 2018-07-09 | Disposition: A | Discharge status: Home / Self Care | Payer: Medicare Other | Source: Ambulatory Visit | Attending: Radiation Oncology | Admitting: Radiation Oncology

## 2018-07-09 ENCOUNTER — Ambulatory Visit: Payer: Medicare Other

## 2018-07-09 DIAGNOSIS — C4492 Squamous cell carcinoma of skin, unspecified: Secondary | ICD-10-CM | POA: Diagnosis not present

## 2018-07-09 DIAGNOSIS — C44729 Squamous cell carcinoma of skin of left lower limb, including hip: Secondary | ICD-10-CM | POA: Diagnosis not present

## 2018-07-12 ENCOUNTER — Ambulatory Visit: Payer: Medicare Other

## 2018-07-12 ENCOUNTER — Ambulatory Visit
Admission: RE | Admit: 2018-07-12 | Discharge: 2018-07-12 | Disposition: A | Discharge status: Home / Self Care | Payer: Medicare Other | Source: Ambulatory Visit | Attending: Radiation Oncology | Admitting: Radiation Oncology

## 2018-07-12 DIAGNOSIS — C4492 Squamous cell carcinoma of skin, unspecified: Secondary | ICD-10-CM | POA: Diagnosis not present

## 2018-07-12 DIAGNOSIS — C44729 Squamous cell carcinoma of skin of left lower limb, including hip: Secondary | ICD-10-CM | POA: Diagnosis not present

## 2018-07-12 DIAGNOSIS — Z85828 Personal history of other malignant neoplasm of skin: Secondary | ICD-10-CM | POA: Diagnosis not present

## 2018-07-12 DIAGNOSIS — C44722 Squamous cell carcinoma of skin of right lower limb, including hip: Secondary | ICD-10-CM | POA: Diagnosis not present

## 2018-07-12 DIAGNOSIS — D485 Neoplasm of uncertain behavior of skin: Secondary | ICD-10-CM | POA: Diagnosis not present

## 2018-07-13 ENCOUNTER — Ambulatory Visit
Admission: RE | Admit: 2018-07-13 | Discharge: 2018-07-13 | Disposition: A | Discharge status: Home / Self Care | Payer: Medicare Other | Source: Ambulatory Visit | Attending: Radiation Oncology | Admitting: Radiation Oncology

## 2018-07-13 ENCOUNTER — Ambulatory Visit: Payer: Medicare Other

## 2018-07-13 DIAGNOSIS — C44729 Squamous cell carcinoma of skin of left lower limb, including hip: Secondary | ICD-10-CM | POA: Diagnosis not present

## 2018-07-13 DIAGNOSIS — C4492 Squamous cell carcinoma of skin, unspecified: Secondary | ICD-10-CM | POA: Diagnosis not present

## 2018-07-14 ENCOUNTER — Ambulatory Visit: Payer: Medicare Other

## 2018-07-14 ENCOUNTER — Ambulatory Visit
Admission: RE | Admit: 2018-07-14 | Discharge: 2018-07-14 | Disposition: A | Discharge status: Home / Self Care | Payer: Medicare Other | Source: Ambulatory Visit | Attending: Radiation Oncology | Admitting: Radiation Oncology

## 2018-07-14 DIAGNOSIS — C4492 Squamous cell carcinoma of skin, unspecified: Secondary | ICD-10-CM | POA: Diagnosis not present

## 2018-07-14 DIAGNOSIS — C44729 Squamous cell carcinoma of skin of left lower limb, including hip: Secondary | ICD-10-CM | POA: Diagnosis not present

## 2018-07-15 ENCOUNTER — Ambulatory Visit
Admission: RE | Admit: 2018-07-15 | Discharge: 2018-07-15 | Disposition: A | Discharge status: Home / Self Care | Payer: Medicare Other | Source: Ambulatory Visit | Attending: Radiation Oncology | Admitting: Radiation Oncology

## 2018-07-15 ENCOUNTER — Ambulatory Visit: Payer: Medicare Other

## 2018-07-15 DIAGNOSIS — C44729 Squamous cell carcinoma of skin of left lower limb, including hip: Secondary | ICD-10-CM | POA: Diagnosis not present

## 2018-07-15 DIAGNOSIS — C4492 Squamous cell carcinoma of skin, unspecified: Secondary | ICD-10-CM | POA: Diagnosis not present

## 2018-07-16 ENCOUNTER — Ambulatory Visit
Admission: RE | Admit: 2018-07-16 | Discharge: 2018-07-16 | Disposition: A | Discharge status: Home / Self Care | Payer: Medicare Other | Source: Ambulatory Visit | Attending: Radiation Oncology | Admitting: Radiation Oncology

## 2018-07-16 ENCOUNTER — Telehealth: Payer: Self-pay | Admitting: *Deleted

## 2018-07-16 ENCOUNTER — Ambulatory Visit: Payer: Medicare Other

## 2018-07-16 DIAGNOSIS — C4492 Squamous cell carcinoma of skin, unspecified: Secondary | ICD-10-CM | POA: Diagnosis not present

## 2018-07-16 DIAGNOSIS — C44729 Squamous cell carcinoma of skin of left lower limb, including hip: Secondary | ICD-10-CM | POA: Diagnosis not present

## 2018-07-16 NOTE — Telephone Encounter (Signed)
Received Dermatopathology Report results from [facility]; forwarded to provider/SLS 11/01

## 2018-07-19 ENCOUNTER — Telehealth: Payer: Self-pay | Admitting: Radiation Oncology

## 2018-07-19 ENCOUNTER — Ambulatory Visit (HOSPITAL_COMMUNITY)
Admission: RE | Admit: 2018-07-19 | Discharge: 2018-07-19 | Disposition: A | Discharge status: Home / Self Care | Payer: Medicare Other | Source: Ambulatory Visit | Attending: Radiation Oncology | Admitting: Radiation Oncology

## 2018-07-19 ENCOUNTER — Ambulatory Visit: Payer: Medicare Other

## 2018-07-19 ENCOUNTER — Telehealth: Payer: Self-pay | Admitting: *Deleted

## 2018-07-19 DIAGNOSIS — R609 Edema, unspecified: Secondary | ICD-10-CM | POA: Insufficient documentation

## 2018-07-19 NOTE — Progress Notes (Signed)
*  Preliminary Results* Left lower extremity venous duplex completed. Left lower extremity is negative for deep vein thrombosis. There is no evidence of left Baker's cyst.  07/19/2018 3:22 PM  Maudry Mayhew, MHA, RVT, RDCS, RDMS

## 2018-07-19 NOTE — Telephone Encounter (Signed)
CALLED PATIENT TO INFORM OF VEN. [place] FOR TODAY (07-19-18 )- ARRIVAL TIME - 2:45 PM @ [facility] ADMITTING, TEST TO BE @ 3 PM, SPOKE WITH PATIENT AND SHE AGREED TO TEST, TEST TO BE @ [facility]

## 2018-07-19 NOTE — Telephone Encounter (Signed)
I called the patient after she left a message about LLE edema and she was in the shower. I spoke with her partner Carmie Kanner, and they are willing to go for stat doppler ultrasound of the LLE and cancel today's treatment. If positive I will initiate blood thinners, but she still needs to get in with PCP regardless of results. Apparently she is having pain as well and trouble walking. We discussed that these symptoms are not typical of radiotherapy related side effects, but given her symptoms rule out DVT as a primary concern. Orders placed for stat doppler at med center [facility]

## 2018-07-20 ENCOUNTER — Ambulatory Visit
Admission: RE | Admit: 2018-07-20 | Discharge: 2018-07-20 | Disposition: A | Discharge status: Home / Self Care | Payer: Medicare Other | Source: Ambulatory Visit | Attending: Radiation Oncology | Admitting: Radiation Oncology

## 2018-07-20 ENCOUNTER — Other Ambulatory Visit: Payer: Self-pay | Admitting: Radiation Oncology

## 2018-07-20 ENCOUNTER — Telehealth: Payer: Self-pay | Admitting: Radiation Oncology

## 2018-07-20 ENCOUNTER — Ambulatory Visit: Payer: Medicare Other

## 2018-07-20 DIAGNOSIS — C4492 Squamous cell carcinoma of skin, unspecified: Secondary | ICD-10-CM

## 2018-07-20 DIAGNOSIS — C44729 Squamous cell carcinoma of skin of left lower limb, including hip: Secondary | ICD-10-CM | POA: Diagnosis not present

## 2018-07-20 MED ORDER — HYDROCODONE-ACETAMINOPHEN 5-325 MG PO TABS: 1.0000 | ORAL_TABLET | Freq: Four times a day (QID) | ORAL | 0 refills | Status: DC | PRN

## 2018-07-20 MED ORDER — SONAFINE EX EMUL
1.0000 "application " | Freq: Once | CUTANEOUS | Status: AC
  Administered 2018-07-20: 1 via TOPICAL

## 2018-07-20 NOTE — Telephone Encounter (Signed)
I called the patient to confirm the doppler results. She is seeing PCP tomorrow but I left her a message letting her know we'd be happy to look at it as well.

## 2018-07-20 NOTE — Progress Notes (Signed)
[place] at [facility] [address], [place], [place] [address] [phone] [id number]  Date:  07/21/2018   Name:  Mary Buck   DOB:  May 03, 1936   MRN:  [id number]  PCP:  Darreld Mclean, MD    Chief Complaint: Leg Pain (left leg pain, swelling, redness, painful all the time, radiation pt)   History of Present Illness:  Mary Buck is a 82 y.o. very pleasant female patient who presents with the following:  History of spinal stenosis, hypothyroidism, hyperlipidemia, bladder cancer, basal and squamous cell carcinoma of the left lower leg.  Here today with concern of leg swelling and redness- She is undergoing radiation therapy of her left shin for widespread skin cancer Last Friday- today is Wednesday- is when her leg started to get red  It has been swollen and last week was very painful She was given hydrocodone to uses for pain   Radiation now for 3 weeks-we think the plan is for 6 weeks total She goes 5 days a week   She spoke with radiology onc staff who ordered a doppler for her for concern of DVT She had doppler of her left leg 2 days ago which was negative for DVT She was asked then to see me to make sure not CHF related Her radiation oncologist is Dr. Lisbeth Renshaw  She has not noted any fever No SOB No history of CHF   No weight change She is trying to prop up her leg when she can which does help, but sx return when her leg is dependent again   Wt Readings from Last 3 Encounters:  07/21/18 174 lb (78.9 kg)  06/01/18 173 lb 4 oz (78.6 kg)  05/26/18 172 lb (78 kg)   Her today with partner Mary Buck who contributes to history    Patient Active Problem List   Diagnosis Date Noted  . Pedal edema 01/31/2018  . Cellulitis 01/31/2018  . Urinary hesitancy 01/31/2018  . Squamous cell skin cancer 12/16/2017  . Plantar fasciitis 07/07/2017  . DDD (degenerative disc disease), lumbar 06/01/2017  . Fibromyalgia 06/01/2017  . Spinal  stenosis at L4-L5 level 04/30/2017  . Peripheral neuropathy 04/30/2017  . Encounter for medication monitoring 03/05/2017  . Bladder cancer ([place]) 08/20/2015  . Cervical stenosis of spinal canal 07/27/2015  . Spinal stenosis of cervical region 07/27/2015  . Spondylolisthesis 07/27/2015  . Hypothyroid 07/05/2015  . Abdominal pain, acute, left upper quadrant 06/28/2015  . Basal cell carcinoma, face 06/28/2015  . Bilateral groin pain 06/28/2015  . Chronic pain 06/28/2015  . Fatigue 06/28/2015  . Hyperlipidemia 06/28/2015  . Pain in joints 06/28/2015  . Paresthesia of lower lip 06/28/2015  . Pulmonary nodule 06/28/2015  . Pustulosis palmaris et plantaris 06/28/2015  . Ulcer of nose 06/28/2015  . 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  . Mass of parotid gland 03/24/2014    Past Medical History:  Diagnosis Date  . Allergy   . Asthma   . Cancer ([place])   . Cataract   . DDD (degenerative disc disease), lumbar 06/01/2017  . Depression   . Heart murmur   . Hyperlipidemia   . Peripheral neuropathy 04/30/2017  . Thyroid disease     Past Surgical History:  Procedure Laterality Date  . bladder cancer  2013  . CARPAL TUNNEL RELEASE  2015  . CATARACT EXTRACTION Bilateral 2012  . CHOLECYSTECTOMY    . SALIVARY GLAND SURGERY Left 2015   benign  . TONSILLECTOMY AND  ADENOIDECTOMY  1943    Social History   Tobacco Use  . Smoking status: Former Smoker    Last attempt to quit: 2015    Years since quitting: 4.8  . Smokeless tobacco: Never Used  Substance Use Topics  . Alcohol use: No  . Drug use: No    Family History  Problem Relation Age of Onset  . Hyperlipidemia Father   . Stroke Father     Allergies  Allergen Reactions  . Cephalexin     Other reaction(s): unsure  . Contrast Media [Iodinated Diagnostic Agents]   . Latex     Other reaction(s): blistering, redness   . Mirtazapine     Other reaction(s): nausea  . Penicillins   . Sulfa Antibiotics   . Sulfur Other (See Comments)     Medication list has been reviewed and updated.  Current Outpatient Medications on File Prior to Visit  Medication Sig Dispense Refill  . Calcium Carbonate-Vitamin D (LIQUID CALCIUM/VITAMIN D PO) Take by mouth.    . clobetasol cream (TEMOVATE) 2.99 % Apply 1 application topically 2 (two) times daily.    Marland Kitchen gabapentin (NEURONTIN) 300 MG capsule Take 1 capsule (300 mg total) by mouth 3 (three) times daily. 90 capsule 4  . HYDROcodone-acetaminophen (NORCO) 5-325 MG tablet Take 1-2 tablets by mouth every 6 (six) hours as needed for moderate pain. 30 tablet 0  . lactobacillus acidophilus (BACID) TABS tablet Take 1 tablet by mouth daily.     . lansoprazole (PREVACID) 30 MG capsule Take 1 capsule (30 mg total) by mouth 2 (two) times daily before a meal. 180 capsule 3  . levothyroxine (SYNTHROID, LEVOTHROID) 88 MCG tablet TAKE 1 TABLET(88 MCG) BY MOUTH DAILY BEFORE BREAKFAST 90 tablet 1  . simvastatin (ZOCOR) 20 MG tablet TAKE 1 TABLET BY MOUTH DAILY 90 tablet 1  . triamcinolone cream (KENALOG) 0.1 % Apply 1 application topically 2 (two) times daily. Use as needed for spots on your skin 45 g 1   No current facility-administered medications on file prior to visit.     Review of Systems:  As per HPI- otherwise negative.   Physical Examination: Vitals:   07/21/18 1028  BP: 136/70  Pulse: 76  Resp: 16  Temp: 98.1 F (36.7 C)  SpO2: 97%   Vitals:   07/21/18 1028  Weight: 174 lb (78.9 kg)  Height: 5\' 6"  (1.676 m)   Body mass index is 28.08 kg/m. Ideal Body Weight: Weight in (lb) to have BMI = 25: 154.6  GEN: WDWN, NAD, Non-toxic, A & O x 3, mild overweight HEENT: Atraumatic, Normocephalic. Neck supple. No masses, No LAD. Ears and Nose: No external deformity. CV: RRR, No M/G/R. No JVD. No thrill. No extra heart sounds. PULM: CTA B, no wheezes, crackles, rhonchi. No retractions. No resp. distress. No accessory muscle use. EXTR: No c/c/e NEURO Normal gait.  PSYCH: Normally  interactive. Conversant. Not depressed or anxious appearing.  Calm demeanor.  Left shin displays a mildly burned appearance c/w radiation treatment The leg has 1+ edema.  Foot is well perfused and normal pulses  Leg is tender even on the non- radiated portions Right leg shows trace edema   I called and spoke with PA- if if recall Shona Simpson, to go over their concerns and what leg should look like at this time. Per her report leg should not generally be swollen  Assessment and Plan: Lower extremity edema - Plan: Basic metabolic panel, B Nat Peptide, hydrochlorothiazide (HYDRODIURIL) 12.5 MG  tablet  Here today with edema and tenderness of left leg while undergoing radiation treatment for diffuse skin cancer Just had a doppler which did not show any DVT Summary: Right: No evidence of common femoral vein obstruction. Left: There is no evidence of deep vein thrombosis in the lower extremity. No cystic structure found in the popliteal fossa Suspect some of her swelling is due to venous insuf and to less walking- some may be due to inflammation Will have her try some HCTZ as I hope reducing swelling will also help with her discomfort Do not much suspect CHF but will check a BNP for her as well  Signed Lamar Blinks, MD  Received her labs- called and LMOM that no sign of CHF Letter to pt  Results for orders placed or performed in visit on 57/26/20  Basic metabolic panel  Result Value Ref Range   Sodium 142 135 - 145 mEq/L   Potassium 4.3 3.5 - 5.1 mEq/L   Chloride 106 96 - 112 mEq/L   CO2 31 19 - 32 mEq/L   Glucose, Bld 89 70 - 99 mg/dL   BUN 13 6 - 23 mg/dL   Creatinine, Ser 0.83 0.40 - 1.20 mg/dL   Calcium 8.9 8.4 - 10.5 mg/dL   GFR 70.33 >60.00 mL/min  B Nat Peptide  Result Value Ref Range   Pro B Natriuretic peptide (BNP) 121.0 (H) 0.0 - 100.0 pg/mL

## 2018-07-21 ENCOUNTER — Ambulatory Visit: Payer: Medicare Other

## 2018-07-21 ENCOUNTER — Ambulatory Visit (INDEPENDENT_AMBULATORY_CARE_PROVIDER_SITE_OTHER): Payer: Medicare Other | Admitting: Family Medicine

## 2018-07-21 ENCOUNTER — Encounter: Payer: Self-pay | Admitting: Family Medicine

## 2018-07-21 ENCOUNTER — Ambulatory Visit
Admission: RE | Admit: 2018-07-21 | Discharge: 2018-07-21 | Disposition: A | Discharge status: Home / Self Care | Payer: Medicare Other | Source: Ambulatory Visit | Attending: Radiation Oncology | Admitting: Radiation Oncology

## 2018-07-21 VITALS — BP 136/70 | HR 76 | Temp 98.1°F | Resp 16 | Ht 66.0 in | Wt 174.0 lb

## 2018-07-21 DIAGNOSIS — R600 Localized edema: Secondary | ICD-10-CM

## 2018-07-21 DIAGNOSIS — C44729 Squamous cell carcinoma of skin of left lower limb, including hip: Secondary | ICD-10-CM | POA: Diagnosis not present

## 2018-07-21 DIAGNOSIS — C4492 Squamous cell carcinoma of skin, unspecified: Secondary | ICD-10-CM | POA: Diagnosis not present

## 2018-07-21 LAB — BASIC METABOLIC PANEL
BUN: 13 mg/dL (ref 6–23)
CHLORIDE: 106 meq/L (ref 96–112)
CO2: 31 mEq/L (ref 19–32)
Calcium: 8.9 mg/dL (ref 8.4–10.5)
Creatinine, Ser: 0.83 mg/dL (ref 0.40–1.20)
GFR: 70.33 mL/min (ref >60.00)
Glucose, Bld: 89 mg/dL (ref 70–99)
POTASSIUM: 4.3 meq/L (ref 3.5–5.1)
Sodium: 142 mEq/L (ref 135–145)

## 2018-07-21 LAB — BRAIN NATRIURETIC PEPTIDE: PRO B NATRI PEPTIDE: 121 pg/mL — AB (ref 0.0–100.0)

## 2018-07-21 MED ORDER — HYDROCHLOROTHIAZIDE 12.5 MG PO TABS: 12.5000 mg | ORAL_TABLET | Freq: Every day | ORAL | 3 refills | Status: DC

## 2018-07-21 NOTE — Patient Instructions (Signed)
I will be in touch with your labs asap Let's have you try HCTZ once a day AS NEEDED for swelling Please let me know how this works for you Please alert me if you are getting worse, have any fever, or other concerns

## 2018-07-22 ENCOUNTER — Ambulatory Visit: Payer: Medicare Other

## 2018-07-22 ENCOUNTER — Ambulatory Visit
Admission: RE | Admit: 2018-07-22 | Discharge: 2018-07-22 | Disposition: A | Discharge status: Home / Self Care | Payer: Medicare Other | Source: Ambulatory Visit | Attending: Radiation Oncology | Admitting: Radiation Oncology

## 2018-07-22 ENCOUNTER — Ambulatory Visit (INDEPENDENT_AMBULATORY_CARE_PROVIDER_SITE_OTHER): Payer: Medicare Other | Admitting: Psychology

## 2018-07-22 ENCOUNTER — Other Ambulatory Visit: Payer: Self-pay | Admitting: Radiation Oncology

## 2018-07-22 DIAGNOSIS — F331 Major depressive disorder, recurrent, moderate: Secondary | ICD-10-CM

## 2018-07-22 DIAGNOSIS — C4492 Squamous cell carcinoma of skin, unspecified: Secondary | ICD-10-CM | POA: Diagnosis not present

## 2018-07-22 DIAGNOSIS — C44729 Squamous cell carcinoma of skin of left lower limb, including hip: Secondary | ICD-10-CM | POA: Diagnosis not present

## 2018-07-22 MED ORDER — HYDROCODONE-ACETAMINOPHEN 5-325 MG PO TABS: 1.0000 | ORAL_TABLET | Freq: Four times a day (QID) | ORAL | 0 refills | Status: DC | PRN

## 2018-07-23 ENCOUNTER — Ambulatory Visit: Payer: Medicare Other

## 2018-07-23 ENCOUNTER — Ambulatory Visit
Admission: RE | Admit: 2018-07-23 | Discharge: 2018-07-23 | Disposition: A | Discharge status: Home / Self Care | Payer: Medicare Other | Source: Ambulatory Visit | Attending: Radiation Oncology | Admitting: Radiation Oncology

## 2018-07-23 DIAGNOSIS — C4492 Squamous cell carcinoma of skin, unspecified: Secondary | ICD-10-CM | POA: Diagnosis not present

## 2018-07-23 DIAGNOSIS — C44729 Squamous cell carcinoma of skin of left lower limb, including hip: Secondary | ICD-10-CM | POA: Diagnosis not present

## 2018-07-25 ENCOUNTER — Encounter (HOSPITAL_BASED_OUTPATIENT_CLINIC_OR_DEPARTMENT_OTHER): Payer: Self-pay | Admitting: Emergency Medicine

## 2018-07-25 ENCOUNTER — Emergency Department (HOSPITAL_BASED_OUTPATIENT_CLINIC_OR_DEPARTMENT_OTHER)
Admission: EM | Admit: 2018-07-25 | Discharge: 2018-07-25 | Disposition: A | Discharge status: Home / Self Care | Payer: Medicare Other | Attending: Emergency Medicine | Admitting: Emergency Medicine

## 2018-07-25 ENCOUNTER — Other Ambulatory Visit: Payer: Self-pay

## 2018-07-25 DIAGNOSIS — T66XXXA Radiation sickness, unspecified, initial encounter: Secondary | ICD-10-CM

## 2018-07-25 DIAGNOSIS — C44729 Squamous cell carcinoma of skin of left lower limb, including hip: Secondary | ICD-10-CM | POA: Insufficient documentation

## 2018-07-25 DIAGNOSIS — J45909 Unspecified asthma, uncomplicated: Secondary | ICD-10-CM | POA: Diagnosis not present

## 2018-07-25 DIAGNOSIS — R2242 Localized swelling, mass and lump, left lower limb: Secondary | ICD-10-CM | POA: Insufficient documentation

## 2018-07-25 DIAGNOSIS — I872 Venous insufficiency (chronic) (peripheral): Secondary | ICD-10-CM | POA: Insufficient documentation

## 2018-07-25 DIAGNOSIS — M7989 Other specified soft tissue disorders: Secondary | ICD-10-CM

## 2018-07-25 DIAGNOSIS — Z79899 Other long term (current) drug therapy: Secondary | ICD-10-CM | POA: Diagnosis not present

## 2018-07-25 DIAGNOSIS — Z87891 Personal history of nicotine dependence: Secondary | ICD-10-CM | POA: Insufficient documentation

## 2018-07-25 DIAGNOSIS — I878 Other specified disorders of veins: Secondary | ICD-10-CM

## 2018-07-25 DIAGNOSIS — T451X5A Adverse effect of antineoplastic and immunosuppressive drugs, initial encounter: Secondary | ICD-10-CM | POA: Diagnosis not present

## 2018-07-25 NOTE — ED Triage Notes (Signed)
Pt presents with redness, patchy white areas and swelling to left leg x couple weeks.

## 2018-07-25 NOTE — ED Provider Notes (Signed)
[facility] EMERGENCY DEPARTMENT Provider Note   CSN: [id number] Arrival date & time: 07/25/18  1537     History   Chief Complaint Chief Complaint  Patient presents with  . Leg Swelling    HPI Mary Buck is a 82 y.o. female.  Patient is a 82 year old female with a history of hyperlipidemia, peripheral neuropathy, thyroid disease and skin cancer who is currently getting radiation on her left lower extremity for skin cancer.  She states it was so extensive they decided to do radiation because they could not treat the separate lesions because there were so many.  She has been getting radiation 5 days a week for the last 4 weeks since his started to have more pain and swelling of the leg.  She did see her PCP on Wednesday and at that time had blood work done and an ultrasound.  She was negative for DVT on Wednesday and lab work including BNP and BMP were relatively normal.  BNP was slightly elevated at 120 but creatinine was normal at 0.80.  Patient was started on Lasix but states it has not made any difference in the swelling of her leg.  She states the swelling of the leg has not gotten any worse but now is swollen in her ankle and her foot.  Because of the swelling and discomfort she has not been able to wear compression stocking and she said seated she has not elevated her leg as much as she should.  She did see her radiation oncologist on Thursday and he felt that all the area looked appropriate.  She denies any infectious symptoms such as fever, general malaise.  She is taking hydrocodone for pain.  The history is provided by the patient and a friend.    Past Medical History:  Diagnosis Date  . Allergy   . Asthma   . Cancer ([place])   . Cataract   . DDD (degenerative disc disease), lumbar 06/01/2017  . Depression   . Heart murmur   . Hyperlipidemia   . Peripheral neuropathy 04/30/2017  . Thyroid disease     Patient Active Problem List   Diagnosis Date Noted  .  Pedal edema 01/31/2018  . Cellulitis 01/31/2018  . Urinary hesitancy 01/31/2018  . Squamous cell skin cancer 12/16/2017  . Plantar fasciitis 07/07/2017  . DDD (degenerative disc disease), lumbar 06/01/2017  . Fibromyalgia 06/01/2017  . Spinal stenosis at L4-L5 level 04/30/2017  . Peripheral neuropathy 04/30/2017  . Encounter for medication monitoring 03/05/2017  . Bladder cancer ([place]) 08/20/2015  . Cervical stenosis of spinal canal 07/27/2015  . Spinal stenosis of cervical region 07/27/2015  . Spondylolisthesis 07/27/2015  . Hypothyroid 07/05/2015  . Abdominal pain, acute, left upper quadrant 06/28/2015  . Basal cell carcinoma, face 06/28/2015  . Bilateral groin pain 06/28/2015  . Chronic pain 06/28/2015  . Fatigue 06/28/2015  . Hyperlipidemia 06/28/2015  . Pain in joints 06/28/2015  . Paresthesia of lower lip 06/28/2015  . Pulmonary nodule 06/28/2015  . Pustulosis palmaris et plantaris 06/28/2015  . Ulcer of nose 06/28/2015  . 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  . Mass of parotid gland 03/24/2014    Past Surgical History:  Procedure Laterality Date  . bladder cancer  2013  . CARPAL TUNNEL RELEASE  2015  . CATARACT EXTRACTION Bilateral 2012  . CHOLECYSTECTOMY    . SALIVARY GLAND SURGERY Left 2015   benign  . TONSILLECTOMY AND ADENOIDECTOMY  1943     OB History   None  Home Medications    Prior to Admission medications   Medication Sig Start Date End Date Taking? Authorizing Provider  Calcium Carbonate-Vitamin D (LIQUID CALCIUM/VITAMIN D PO) Take by mouth.    [provider]  clobetasol cream (TEMOVATE) 7.90 % Apply 1 application topically 2 (two) times daily.    [provider]  gabapentin (NEURONTIN) 300 MG capsule Take 1 capsule (300 mg total) by mouth 3 (three) times daily. 04/30/17   Copland, Gay Filler, MD  hydrochlorothiazide (HYDRODIURIL) 12.5 MG tablet Take 1 tablet (12.5 mg total) by mouth daily. Use daily as needed for swelling 07/21/18    Copland, Gay Filler, MD  HYDROcodone-acetaminophen (NORCO) 5-325 MG tablet Take 1-2 tablets by mouth every 6 (six) hours as needed for moderate pain. 07/22/18   Kyung Rudd, MD  lactobacillus acidophilus (BACID) TABS tablet Take 1 tablet by mouth daily.     [provider]  lansoprazole (PREVACID) 30 MG capsule Take 1 capsule (30 mg total) by mouth 2 (two) times daily before a meal. 09/23/17   Copland, Gay Filler, MD  levothyroxine (SYNTHROID, LEVOTHROID) 88 MCG tablet TAKE 1 TABLET(88 MCG) BY MOUTH DAILY BEFORE BREAKFAST 06/01/18   Copland, Gay Filler, MD  simvastatin (ZOCOR) 20 MG tablet TAKE 1 TABLET BY MOUTH DAILY 05/26/18   Copland, Gay Filler, MD  triamcinolone cream (KENALOG) 0.1 % Apply 1 application topically 2 (two) times daily. Use as needed for spots on your skin 05/26/18   Copland, Gay Filler, MD    Family History Family History  Problem Relation Age of Onset  . Hyperlipidemia Father   . Stroke Father     Social History Social History   Tobacco Use  . Smoking status: Former Smoker    Last attempt to quit: 2015    Years since quitting: 4.8  . Smokeless tobacco: Never Used  Substance Use Topics  . Alcohol use: No  . Drug use: No     Allergies   Cephalexin; Contrast media [iodinated diagnostic agents]; Latex; Mirtazapine; Penicillins; Sulfa antibiotics; and Sulfur   Review of Systems Review of Systems  All other systems reviewed and are negative.    Physical Exam Updated Vital Signs BP (!) 141/73 (BP Location: Left Arm)   Pulse 87   Temp 98.3 F (36.8 C) (Oral)   Resp 18   Ht 5\' 6"  (1.676 m)   Wt 76.8 kg   SpO2 100%   BMI 27.34 kg/m   Physical Exam  Constitutional: She is oriented to person, place, and time. She appears well-developed and well-nourished. No distress.  HENT:  Head: Normocephalic and atraumatic.  Dry mucous membranes  Eyes: Pupils are equal, round, and reactive to light. EOM are normal.  Neck: Normal range of motion. Neck supple.    Cardiovascular: Normal rate and intact distal pulses.  Pulmonary/Chest: Effort normal. No respiratory distress.  Musculoskeletal: Normal range of motion. She exhibits edema and tenderness.       Legs: Neurological: She is alert and oriented to person, place, and time.  Skin: Skin is warm and dry. No rash noted. No erythema.  Psychiatric: She has a normal mood and affect. Her behavior is normal.  Nursing note and vitals reviewed.    ED Treatments / Results  Labs (all labs ordered are listed, but only abnormal results are displayed) Labs Reviewed - No data to display  EKG None  Radiology No results found.  Procedures Procedures (including critical care time)  Medications Ordered in ED Medications - No data to  display   Initial Impression / Assessment and Plan / ED Course  I have reviewed the triage vital signs and the nursing notes.  Pertinent labs & imaging results that were available during my care of the patient were reviewed by me and considered in my medical decision making (see chart for details).     Patient is a pleasant 82 year old female presenting today with persistent swelling that is worsening in her left lower extremity.  She is currently getting radiation on this leg for skin cancer.  However she states the swelling has persisted and is now in her foot and ankle.  Patient recently had DVT study done on Wednesday which was negative and labs that showed normal renal function and only minimally elevated BNP of 120.  She was started on Lasix by her PCP on Wednesday but states the swelling is not gotten any better only worse.  She has no swelling in her right lower extremity and no signs of generalized fluid overload.  Suspect that the swelling is related to venous stasis as patient is not wearing a compression stocking and has not been elevating her leg as much as she said she should.  Low suspicion for infection at this time.  Patient does appear to have some radiation  burns to the skin.  Palpable DP pulse and capillary refill of 2 seconds.  Patient's leg was covered with Vaseline gauze and no stick bandages and Ace wrap was applied to try to help with the swelling.  She will plan on following up with radiation as planned this week and have them reevaluate at that time.  She is also going to stop the Lasix as it causes a severe dry mouth and seems to not be helping.  Final Clinical Impressions(s) / ED Diagnoses   Final diagnoses:  Left leg swelling  Venous stasis of lower extremity  Radiation adverse effect, initial encounter    ED Discharge Orders    None       Blanchie Dessert, MD 07/25/18 [phone]

## 2018-07-25 NOTE — Discharge Instructions (Addendum)
Try to elevate your left leg as much as you can.  Use the ace wrap if the compression stocking is too painful.

## 2018-07-25 NOTE — ED Notes (Signed)
ED Provider at bedside. 

## 2018-07-25 NOTE — ED Notes (Signed)
Xeroform applied to left leg and wrapped with dry dressing and wrapped with ace wrap.

## 2018-07-26 ENCOUNTER — Ambulatory Visit: Payer: Medicare Other

## 2018-07-27 ENCOUNTER — Ambulatory Visit: Payer: Medicare Other

## 2018-07-27 ENCOUNTER — Ambulatory Visit: Admission: RE | Admit: 2018-07-27 | Payer: Medicare Other | Source: Ambulatory Visit

## 2018-07-28 ENCOUNTER — Ambulatory Visit: Payer: Medicare Other

## 2018-07-29 ENCOUNTER — Other Ambulatory Visit: Payer: Self-pay | Admitting: Radiation Oncology

## 2018-07-29 ENCOUNTER — Telehealth: Payer: Self-pay | Admitting: Radiation Oncology

## 2018-07-29 ENCOUNTER — Ambulatory Visit: Payer: Medicare Other

## 2018-07-29 ENCOUNTER — Ambulatory Visit: Payer: Self-pay

## 2018-07-29 DIAGNOSIS — C4492 Squamous cell carcinoma of skin, unspecified: Secondary | ICD-10-CM

## 2018-07-29 NOTE — Telephone Encounter (Signed)
Sent to wrong office will fwd to [facility].

## 2018-07-29 NOTE — Telephone Encounter (Signed)
I spoke with the patient about her pain. It sounds like she's having nerve pain in her leg as a result of all the swelling she's had. 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 She can also increase her neurontin to 600 mg TID and should avoid Norco for now. I would also discontinue the diuretic she was given for prn use as it has not helped either. We will follow up with her next week.

## 2018-07-29 NOTE — Telephone Encounter (Signed)
Attempted to triage Patient, was stopped as I began to ask question. Patient states that none of the medications  (gabapentin and hydrocodone) are working.  Patient inquires if she could receive " a shot of something" Informed Patient that I would forward her question to the officice

## 2018-07-30 ENCOUNTER — Ambulatory Visit: Payer: Medicare Other

## 2018-08-02 ENCOUNTER — Ambulatory Visit: Payer: Medicare Other

## 2018-08-03 ENCOUNTER — Ambulatory Visit: Payer: Medicare Other

## 2018-08-03 ENCOUNTER — Telehealth: Payer: Self-pay | Admitting: *Deleted

## 2018-08-03 NOTE — Telephone Encounter (Signed)
I spoke to the patient regarding a voicemail I received.  She states that her skin was ripped off by her sock and is now open in the area where treatment was done.  She was wondering what could be done for this.  I let her know that she can use some aquaphor or neosporin to these areas and keep her sock off.  She was encouraged to elevate her leg to at least heart level as often as possible.  I let her know that she can use a nonstick dressing at night.  She is to see PT tomorrow.  Will continue to follow as necessary.  Gloriajean Dell. Leonie Green, BSN

## 2018-08-04 ENCOUNTER — Ambulatory Visit: Payer: Medicare Other

## 2018-08-04 ENCOUNTER — Encounter: Payer: Self-pay | Admitting: Rehabilitation

## 2018-08-04 ENCOUNTER — Ambulatory Visit: Payer: Medicare Other | Attending: Radiation Oncology | Admitting: Rehabilitation

## 2018-08-04 ENCOUNTER — Other Ambulatory Visit: Payer: Self-pay

## 2018-08-04 DIAGNOSIS — C4492 Squamous cell carcinoma of skin, unspecified: Secondary | ICD-10-CM | POA: Diagnosis not present

## 2018-08-04 DIAGNOSIS — I890 Lymphedema, not elsewhere classified: Secondary | ICD-10-CM | POA: Insufficient documentation

## 2018-08-04 DIAGNOSIS — R2689 Other abnormalities of gait and mobility: Secondary | ICD-10-CM | POA: Insufficient documentation

## 2018-08-04 NOTE — Therapy (Signed)
[place], [place], [address] Phone: [phone]   Fax:  [phone]  Physical Therapy Evaluation  Patient Details  Name: Mary Buck MRN: [id number] Date of Birth: [birth year] Referring Provider (PT): Shona Simpson. PA-C   Encounter Date: 08/04/2018  PT End of Session - 08/04/18 2136    Visit Number  1    Number of Visits  12    Date for PT Re-Evaluation  09/15/18    PT Start Time  1100    PT Stop Time  1150    PT Time Calculation (min)  50 min    Activity Tolerance  Patient limited by pain;Treatment limited secondary to medical complications (Comment)    Behavior During Therapy  [facility] for tasks assessed/performed       Past Medical History:  Diagnosis Date  . Allergy   . Asthma   . Cancer ([place])   . Cataract   . DDD (degenerative disc disease), lumbar 06/01/2017  . Depression   . Heart murmur   . Hyperlipidemia   . Peripheral neuropathy 04/30/2017  . Thyroid disease     Past Surgical History:  Procedure Laterality Date  . bladder cancer  2013  . CARPAL TUNNEL RELEASE  2015  . CATARACT EXTRACTION Bilateral 2012  . CHOLECYSTECTOMY    . SALIVARY GLAND SURGERY Left 2015   benign  . TONSILLECTOMY AND ADENOIDECTOMY  1943    There were no vitals filed for this visit.   Subjective Assessment - 08/04/18 1103    Subjective  started swelling about a week and a half ago.  They had to cancel the last 2 weeks due to the skin effects.  I'm just in so much pain    Patient is accompained by:  --   partner   Pertinent History  spinal stenosis, hypothyroidism, hyperlipidemia, bladder cancer, basal and squamous cell carcinoma of the left lower leg.    Limitations  Walking;House hold activities;Standing    Currently in Pain?  Yes    Pain Score  9     Pain Location  Leg    Pain Orientation  Left    Pain Descriptors / Indicators  Burning;Aching;Sore;Throbbing    Pain Type  Acute pain    Pain  Onset  1 to 4 weeks ago    Pain Frequency  Constant    Aggravating Factors   any movement, anything touching it         [facility] PT Assessment - 08/04/18 0001      Assessment   Medical Diagnosis  Lt squamous cell carcinoma lower leg    Referring Provider (PT)  Shona Simpson. PA-C    Onset Date/Surgical Date  07/21/18    Hand Dominance  Right    Next MD Visit  this week    Prior Therapy  no      Precautions   Precaution Comments  cancer history and active cancer Rt LE, lymphedema      Restrictions   Weight Bearing Restrictions  No    Other Position/Activity Restrictions  using a WC and cane due to severity of leg pain      Balance Screen   Has the patient fallen in the past 6 months  No    Has the patient had a decrease in activity level because of a fear of falling?   Yes    Is the patient reluctant to leave their home because of a fear of falling?   No  Home Environment   Living Environment  Private residence    Living Arrangements  Spouse/significant other    Available Help at Discharge  Family      Prior Function   Level of Independence  Independent      Cognition   Overall Cognitive Status  Within Functional Limits for tasks assessed      Observation/Other Assessments   Observations  redness and edema in the foot and ankle, calf and to the knee.  Pt has been cleared for DVT and infection about 1 week ago.  Significant skin breakdown whole anterior calf and into lateral and medial aspects and dead macerated and sloughing tissue.  2 open places anterior shin from removing her sock per pt.  bad odor.  pt in severe pain and unable to get comfortable        LYMPHEDEMA/ONCOLOGY QUESTIONNAIRE - 08/04/18 1120      Type   Cancer Type  squamous cell skin cancer      Date Lymphedema/Swelling Started   Date  07/21/18      Treatment   Past Radiation Treatment  Yes    Date  --   16 sessions having to cancel the last 2 weeks due to effects   Body Site  Lt lower leg       What other symptoms do you have   Are you Having Heaviness or Tightness  Yes    Are you having Pain  Yes      Lymphedema Assessments   Lymphedema Assessments  Lower extremities      Right Lower Extremity Lymphedema   At Midpatella/Popliteal Crease  46 cm    30 cm Proximal to Floor at Lateral Plantar Foot  35 cm    20 cm Proximal to Floor at Lateral Plantar Foot  25.5 1    10  cm Proximal to Floor at Lateral Malleoli  20.5 cm    Circumference of ankle/heel  21 cm.    5 cm Proximal to 1st MTP Joint  20.9 cm    Across MTP Joint  20.8 cm    Around Proximal Great Toe  7.3 cm      Left Lower Extremity Lymphedema   30 cm Proximal to Suprapatella  --    At Midpatella/Popliteal Crease  41 cm    30 cm Proximal to Floor at Lateral Plantar Foot  41.3 cm   at 35 cm proximal just above skin breakdown   Circumference of ankle/heel  27.8 cm.    5 cm Proximal to 1st MTP Joint  23.3 cm    Across MTP Joint  23.3 cm    Around Proximal Great Toe  7.9 cm             Outpatient Re[facility]04/[facility]  Lymphedema Life Impact Scale Total Score  8.82 %      Objective measurements completed on examination: See above findings.      [place] Adult PT Treatment/Exercise - 08/04/18 0001      Transfers   Comments  --      Self-Care   Self-Care  Other Self-Care Comments    Other Self-Care Comments   elevation, ankle pumps, walking as much as possible, changing socks as much as possible, changing nonadherent dressing when it looks saturated       Manual Therapy   Manual Therapy  Manual Lymphatic Drainage (MLD)    Manual Lymphatic Drainage (MLD)  education to pt and partner about MLD  for the Lt extremity using the following: breathing, Lt inguinal nodes, outer thigh, inner to outer thigh, knee, and proximal lower leg, then ankle pumps for the lower leg and working back proximally             PT Education - 08/04/18 2135    Education  Details  POC, see instruction section    Person(s) Educated  Patient;Spouse    Methods  Explanation;Demonstration;Handout    Comprehension  Verbalized understanding;Returned demonstration          PT Long Term Goals - 08/04/18 2146      PT LONG TERM GOAL #1   Title  Pt will return to ambulating without AD    Time  6    Period  Weeks    Status  New    Target Date  09/15/18      PT LONG TERM GOAL #2   Title  Pt will improve LLIS score to at least 18    Time  6    Period  Weeks    Status  New    Target Date  09/15/18      PT LONG TERM GOAL #3   Title  Pt will decrease circumferential measurements at the foot and ankle by 3cm or greater to match the Rt LE    Time  6    Period  Weeks    Status  New    Target Date  09/15/18      PT LONG TERM GOAL #4   Title  Pt will obtain and be knowledgeable about appropriate compression garments     Time  6    Period  Weeks    Status  New    Target Date  09/15/18             Plan - 08/04/18 2136    Clinical Impression Statement  Mary Buck presents today with onset of swelling, redness, pain, and signficant skin breakdown at the anterior left lower leg due to radiation.  DVT and infection were recently ruled out but they are having a hard time controlling her pain.  Pt is very uncomfortable in all positions and does not let anything touch her lower leg.  Slight warmth and redness to the foot and ankle but pt reporting this is the same as last week.  Pt knows to go to ED if this worsens.  Referring provider aware.  After consulting with another provider we decided not to wrap at this time due to pain and skin condition.  Pt will focus on elevation and modified MLD technique/ankle pumps until pain reduces and/or when pt thinks she can tolerate compression even gently to the lower leg.      History and Personal Factors relevant to plan of care:  spinal stenosis, hypothyroidism, hyperlipidemia, bladder cancer, basal and squamous cell carcinoma  of the left lower leg.    Clinical Presentation  Evolving    Clinical Presentation due to:  high pain level, new skin breakdown    Clinical Decision Making  Moderate    Rehab Potential  Good    Clinical Impairments Affecting Rehab Potential  currently pain    PT Frequency  3x / week    PT Duration  6 weeks   when able to start   PT Treatment/Interventions  ADLs/Self Care Home Management;Gait training;Therapeutic exercise;Patient/family education;Manual lymph drainage;Compression bandaging    PT Next Visit Plan  reassess when pt is ready to attempt treatment; begin CDT as tolerated  Consulted and Agree with Plan of Care  Patient;Family member/caregiver       Patient will benefit from skilled therapeutic intervention in order to improve the following deficits and impairments:  Decreased skin integrity, Pain, Increased edema  Visit Diagnosis: Squamous cell skin cancer  Lymphedema, not elsewhere classified  Other abnormalities of gait and mobility     Problem List Patient Active Problem List   Diagnosis Date Noted  . Pedal edema 01/31/2018  . Cellulitis 01/31/2018  . Urinary hesitancy 01/31/2018  . Squamous cell skin cancer 12/16/2017  . Plantar fasciitis 07/07/2017  . DDD (degenerative disc disease), lumbar 06/01/2017  . Fibromyalgia 06/01/2017  . Spinal stenosis at L4-L5 level 04/30/2017  . Peripheral neuropathy 04/30/2017  . Encounter for medication monitoring 03/05/2017  . Bladder cancer ([place]) 08/20/2015  . Cervical stenosis of spinal canal 07/27/2015  . Spinal stenosis of cervical region 07/27/2015  . Spondylolisthesis 07/27/2015  . Hypothyroid 07/05/2015  . Abdominal pain, acute, left upper quadrant 06/28/2015  . Basal cell carcinoma, face 06/28/2015  . Bilateral groin pain 06/28/2015  . Chronic pain 06/28/2015  . Fatigue 06/28/2015  . Hyperlipidemia 06/28/2015  . Pain in joints 06/28/2015  . Paresthesia of lower lip 06/28/2015  . Pulmonary nodule 06/28/2015   . Pustulosis palmaris et plantaris 06/28/2015  . Ulcer of nose 06/28/2015  . 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  . Mass of parotid gland 03/24/2014    Mary Buck, PT 08/04/2018, 9:50 PM  [place], [place], [address] Phone: [phone]   Fax:  [phone]  Name: Mary Buck MRN: [id number] Date of Birth: 1936/01/10

## 2018-08-04 NOTE — Patient Instructions (Addendum)
Deep Effective Breath   Standing, sitting, or laying down place both hands on the belly. Take a deep breath IN, expanding the belly; then breath OUT, contracting the belly. Repeat __5__ times. Do __2-3__ sessions per day and before each self massage.  http://gt2.exer.us/866   Do _1__ time per day.  Circles at inguinal nodes  Copyright  VHI. All rights reserved.  LEG: Knee to Hip - Clear   Pump up outer thigh of involved leg from knee to outer hip. Then do stationary circles from inner to outer thigh, then do outer thigh again. Next, interlace fingers behind knee IF ABLE and make in-place circles. Do _5_ times of each sequence.  Do _1__ time per day.   Ankle pumps for the foot and ankle instead of MLD and then reversing pathway   Return to PT when pain seems more manageable or if wanting to attempt wrapping at any point

## 2018-08-05 ENCOUNTER — Ambulatory Visit: Payer: Medicare Other

## 2018-08-06 ENCOUNTER — Ambulatory Visit: Payer: Medicare Other

## 2018-08-06 ENCOUNTER — Other Ambulatory Visit: Payer: Self-pay | Admitting: Radiation Oncology

## 2018-08-06 MED ORDER — HYDROCODONE-ACETAMINOPHEN 5-325 MG PO TABS: 1.0000 | ORAL_TABLET | Freq: Four times a day (QID) | ORAL | 0 refills | Status: DC | PRN

## 2018-08-09 ENCOUNTER — Ambulatory Visit: Payer: Medicare Other

## 2018-08-09 ENCOUNTER — Ambulatory Visit: Payer: Medicare Other | Admitting: Rehabilitation

## 2018-08-10 ENCOUNTER — Ambulatory Visit: Payer: Medicare Other

## 2018-08-10 DIAGNOSIS — C4492 Squamous cell carcinoma of skin, unspecified: Secondary | ICD-10-CM | POA: Diagnosis not present

## 2018-08-10 DIAGNOSIS — R2689 Other abnormalities of gait and mobility: Secondary | ICD-10-CM | POA: Diagnosis not present

## 2018-08-10 DIAGNOSIS — I890 Lymphedema, not elsewhere classified: Secondary | ICD-10-CM

## 2018-08-10 NOTE — Therapy (Signed)
[place], [place], [address] Phone: [phone]   Fax:  [phone]  Physical Therapy Treatment  Patient Details  Name: Mary Buck MRN: [id number] Date of Birth: Mar 14, 1936 Referring Provider (PT): Shona Simpson. PA-C   Encounter Date: 08/10/2018  PT End of Session - 08/10/18 1021    Visit Number  2    Number of Visits  12    Date for PT Re-Evaluation  09/15/18    PT Start Time  0936    PT Stop Time  1021    PT Time Calculation (min)  45 min    Activity Tolerance  Patient tolerated treatment well;Treatment limited secondary to medical complications (Comment)    Behavior During Therapy  [facility] for tasks assessed/performed       Past Medical History:  Diagnosis Date  . Allergy   . Asthma   . Cancer ([place])   . Cataract   . DDD (degenerative disc disease), lumbar 06/01/2017  . Depression   . Heart murmur   . Hyperlipidemia   . Peripheral neuropathy 04/30/2017  . Thyroid disease     Past Surgical History:  Procedure Laterality Date  . bladder cancer  2013  . CARPAL TUNNEL RELEASE  2015  . CATARACT EXTRACTION Bilateral 2012  . CHOLECYSTECTOMY    . SALIVARY GLAND SURGERY Left 2015   benign  . TONSILLECTOMY AND ADENOIDECTOMY  1943    There were no vitals filed for this visit.  Subjective Assessment - 08/10/18 0948    Subjective  My skin is slowly starting to heal. I've been trying to do the massage daily, my partner helps with this some.      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    Currently in Pain?  Yes    Pain Score  6     Pain Location  Leg    Pain Orientation  Left    Pain Descriptors / Indicators  Aching;Sharp    Pain Type  Acute pain    Pain Onset  1 to 4 weeks ago    Pain Frequency  Intermittent    Aggravating Factors   touching it    Pain Relieving Factors  walking and  just time                  Outpatient Re[facility] from [facility]04/[facility]  Lymphedema Life Impact Scale Total Score  8.82 %           [facility] Adult PT Treatment/Exercise - 08/10/18 0001      Manual Therapy   Manual Therapy  Manual Lymphatic Drainage (MLD)    Manual Lymphatic Drainage (MLD)  In Supine: Short neck, Lt axillary and inguinal nodes, Lt inguinao-axillary anastomosis, and then Lt LE working from [employer] to distal (to lower leg stopping at radiated tissue) then retacing steps and reviewing with pt and partner throughout.                  PT Long Term Goals - 08/04/18 2146      PT LONG TERM GOAL #1   Title  Pt will return to ambulating without AD    Time  6    Period  Weeks    Status  New    Target Date  09/15/18      PT LONG TERM GOAL #2   Title  Pt will improve LLIS  score to at least 18    Time  6    Period  Weeks    Status  New    Target Date  09/15/18      PT LONG TERM GOAL #3   Title  Pt will decrease circumferential measurements at the foot and ankle by 3cm or greater to match the Rt LE    Time  6    Period  Weeks    Status  New    Target Date  09/15/18      PT LONG TERM GOAL #4   Title  Pt will obtain and be knowledgeable about appropriate compression garments     Time  6    Period  Weeks    Status  New    Target Date  09/15/18            Plan - 08/10/18 1116    Clinical Impression Statement  Ms. Morgans skin has healed some since last week. Still with sloughing occuring of healing tissue at lower leg so decided not to bandage as thi swill trap sloughing tissue onto underlying healing tissue. Pt agreeable to this so continued with manual lymph drainage reviewing technique while performing with pt and her partner. Pt plans to come 1x/week until we can begin bandaging.    Rehab Potential  Good    Clinical Impairments Affecting Rehab Potential  currently pain    PT Frequency  3x / week     PT Duration  6 weeks   when able to start   PT Treatment/Interventions  ADLs/Self Care Home Management;Gait training;Therapeutic exercise;Patient/family education;Manual lymph drainage;Compression bandaging    PT Next Visit Plan  reassess when pt is ready to attempt treatment; begin CDT as tolerated     Consulted and Agree with Plan of Care  Patient;Family member/caregiver    Family Member Consulted  Partner, [name]       Patient will benefit from skilled therapeutic intervention in order to improve the following deficits and impairments:  Decreased skin integrity, Pain, Increased edema  Visit Diagnosis: Squamous cell skin cancer  Lymphedema, not elsewhere classified  Other abnormalities of gait and mobility     Problem List Patient Active Problem List   Diagnosis Date Noted  . Pedal edema 01/31/2018  . Cellulitis 01/31/2018  . Urinary hesitancy 01/31/2018  . Squamous cell skin cancer 12/16/2017  . Plantar fasciitis 07/07/2017  . DDD (degenerative disc disease), lumbar 06/01/2017  . Fibromyalgia 06/01/2017  . Spinal stenosis at L4-L5 level 04/30/2017  . Peripheral neuropathy 04/30/2017  . Encounter for medication monitoring 03/05/2017  . Bladder cancer ([place]) 08/20/2015  . Cervical stenosis of spinal canal 07/27/2015  . Spinal stenosis of cervical region 07/27/2015  . Spondylolisthesis 07/27/2015  . Hypothyroid 07/05/2015  . Abdominal pain, acute, left upper quadrant 06/28/2015  . Basal cell carcinoma, face 06/28/2015  . Bilateral groin pain 06/28/2015  . Chronic pain 06/28/2015  . Fatigue 06/28/2015  . Hyperlipidemia 06/28/2015  . Pain in joints 06/28/2015  . Paresthesia of lower lip 06/28/2015  . Pulmonary nodule 06/28/2015  . Pustulosis palmaris et plantaris 06/28/2015  . Ulcer of nose 06/28/2015  . 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  . Mass of parotid gland 03/24/2014    Otelia Limes, PTA 08/10/2018, 11:20 AM  [place], [place], [address] Phone: [phone]   Fax:  [phone]  Name: Mary Buck MRN: [id number] Date of Birth: 1936-02-04

## 2018-08-16 ENCOUNTER — Other Ambulatory Visit: Payer: Self-pay | Admitting: Radiation Oncology

## 2018-08-16 MED ORDER — HYDROCODONE-ACETAMINOPHEN 5-325 MG PO TABS: 1.0000 | ORAL_TABLET | Freq: Four times a day (QID) | ORAL | 0 refills | Status: DC | PRN

## 2018-08-17 ENCOUNTER — Ambulatory Visit (INDEPENDENT_AMBULATORY_CARE_PROVIDER_SITE_OTHER): Payer: Medicare Other | Admitting: Psychology

## 2018-08-17 DIAGNOSIS — F331 Major depressive disorder, recurrent, moderate: Secondary | ICD-10-CM | POA: Diagnosis not present

## 2018-08-18 ENCOUNTER — Encounter: Payer: Self-pay | Admitting: Rehabilitation

## 2018-08-18 ENCOUNTER — Ambulatory Visit: Payer: Medicare Other | Attending: Radiation Oncology | Admitting: Rehabilitation

## 2018-08-18 DIAGNOSIS — R2689 Other abnormalities of gait and mobility: Secondary | ICD-10-CM | POA: Insufficient documentation

## 2018-08-18 DIAGNOSIS — I890 Lymphedema, not elsewhere classified: Secondary | ICD-10-CM | POA: Insufficient documentation

## 2018-08-18 DIAGNOSIS — C4492 Squamous cell carcinoma of skin, unspecified: Secondary | ICD-10-CM | POA: Insufficient documentation

## 2018-08-18 NOTE — Therapy (Signed)
[place], [place], [address] Phone: [phone]   Fax:  [phone]  Physical Therapy Treatment  Patient Details  Name: Mary Buck MRN: [id number] Date of Birth: [birth year] Referring Provider (PT): Shona Simpson. PA-C   Encounter Date: 08/18/2018  PT End of Session - 08/18/18 1235    Visit Number  4    Number of Visits  12    Date for PT Re-Evaluation  09/15/18    PT Start Time  0930    PT Stop Time  1017    PT Time Calculation (min)  47 min    Activity Tolerance  Patient tolerated treatment well;Treatment limited secondary to medical complications (Comment)    Behavior During Therapy  [facility] for tasks assessed/performed       Past Medical History:  Diagnosis Date  . Allergy   . Asthma   . Cancer ([place])   . Cataract   . DDD (degenerative disc disease), lumbar 06/01/2017  . Depression   . Heart murmur   . Hyperlipidemia   . Peripheral neuropathy 04/30/2017  . Thyroid disease     Past Surgical History:  Procedure Laterality Date  . bladder cancer  2013  . CARPAL TUNNEL RELEASE  2015  . CATARACT EXTRACTION Bilateral 2012  . CHOLECYSTECTOMY    . SALIVARY GLAND SURGERY Left 2015   benign  . TONSILLECTOMY AND ADENOIDECTOMY  1943    There were no vitals filed for this visit.  Subjective Assessment - 08/18/18 0931    Subjective  I even forgot my cane yesterday.  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    Limitations  Walking;House hold activities;Standing    Currently in Pain?  Yes    Pain Score  6     Pain Location  Leg    Pain Orientation  Left    Pain Descriptors / Indicators  Aching;Sharp    Pain Onset  More than a month ago    Pain Frequency  Constant    Aggravating Factors   touching it     Pain Relieving Factors  walking and just time              LYMPHEDEMA/ONCOLOGY QUESTIONNAIRE - 08/18/18 0957      Left Lower Extremity Lymphedema   At Midpatella/Popliteal Crease  38.7 cm    30 cm Proximal to Floor at Lateral Plantar Foot  40.8 cm    Circumference of ankle/heel  24 cm.    5 cm Proximal to 1st MTP Joint  22.5 cm    Across MTP Joint  22.2 cm    Around Proximal Great Toe  7.5 cm           Outpatient Rehab from 08/04/2018 in [place]  Lymphedema Life Impact Scale Total Score  8.82 %           [facility] Adult PT Treatment/Exercise - 08/18/18 0001      Manual Therapy   Manual therapy comments  measurements taken    Manual Lymphatic Drainage (MLD)  In Supine: Short neck, Lt axillary and inguinal nodes, Lt inguinao-axillary anastomosis, and then Lt LE working from [employer] to distal (to lower leg stopping at radiated tissue) then retacing steps.  Applied lotion to pt's foot up to the radiated tissue  PT Long Term Goals - 08/18/18 1238      PT LONG TERM GOAL #1   Title  Pt will return to ambulating without AD    Status  Partially Met      PT LONG TERM GOAL #2   Title  Pt will improve LLIS score to at least 18    Status  Partially Met      PT LONG TERM GOAL #3   Title  Pt will decrease circumferential measurements at the foot and ankle by 3cm or greater to match the Rt LE    Status  Partially Met      PT LONG TERM GOAL #4   Title  Pt will obtain and be knowledgeable about appropriate compression garments     Status  On-going            Plan - 08/18/18 1236    Clinical Impression Statement  Pt's LE much improved today since this PT evaluated her.  The LE is not weeping and is no longer as painful and red.  Her edema has also decreased about 1.5cm in all locations.  Still with a significant amount of sloughing tissue and a few open places anterior shin.  Pt will recheck in 2 weeks to see if it has continued to heal and will do MLD at  home with elevation and walking.  Hopefully lymphedema will not set in and most edema was inflammatory.      PT Treatment/Interventions  ADLs/Self Care Home Management;Gait training;Therapeutic exercise;Patient/family education;Manual lymph drainage;Compression bandaging    PT Next Visit Plan  how does skin look, does it need compression?       Patient will benefit from skilled therapeutic intervention in order to improve the following deficits and impairments:     Visit Diagnosis: Squamous cell skin cancer  Lymphedema, not elsewhere classified  Other abnormalities of gait and mobility     Problem List Patient Active Problem List   Diagnosis Date Noted  . Pedal edema 01/31/2018  . Cellulitis 01/31/2018  . Urinary hesitancy 01/31/2018  . Squamous cell skin cancer 12/16/2017  . Plantar fasciitis 07/07/2017  . DDD (degenerative disc disease), lumbar 06/01/2017  . Fibromyalgia 06/01/2017  . Spinal stenosis at L4-L5 level 04/30/2017  . Peripheral neuropathy 04/30/2017  . Encounter for medication monitoring 03/05/2017  . Bladder cancer ([place]) 08/20/2015  . Cervical stenosis of spinal canal 07/27/2015  . Spinal stenosis of cervical region 07/27/2015  . Spondylolisthesis 07/27/2015  . Hypothyroid 07/05/2015  . Abdominal pain, acute, left upper quadrant 06/28/2015  . Basal cell carcinoma, face 06/28/2015  . Bilateral groin pain 06/28/2015  . Chronic pain 06/28/2015  . Fatigue 06/28/2015  . Hyperlipidemia 06/28/2015  . Pain in joints 06/28/2015  . Paresthesia of lower lip 06/28/2015  . Pulmonary nodule 06/28/2015  . Pustulosis palmaris et plantaris 06/28/2015  . Ulcer of nose 06/28/2015  . 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  . Mass of parotid gland 03/24/2014    Mary Buck, PT 08/18/2018, 12:39 PM  [place], [place], [address] Phone: [phone]   Fax:  [phone]  Name: Mary Buck MRN:  [id number] Date of Birth: [birth year]

## 2018-08-20 ENCOUNTER — Encounter: Payer: Self-pay | Admitting: Radiation Oncology

## 2018-08-20 NOTE — Progress Notes (Signed)
  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 She completed 19 fractions (38 Gy) before stopping due to increased swelling, pain, and skin reaction.  Plan: The patient has completed radiation treatment. The patient will return to radiation oncology clinic for routine followup in one month. I advised the patient to call or return sooner if they have any questions or concerns related to their recovery or treatment. ________________________________  Jodelle Gross, M.D., Ph.D.   This document serves as a record of services personally performed by Kyung Rudd, MD. It was created on his behalf by Wilburn Mylar, a trained medical scribe. The creation of this record is based on the scribe's personal observations and the provider's statements to them. This document has been checked and approved by the attending provider.

## 2018-08-24 ENCOUNTER — Other Ambulatory Visit: Payer: Self-pay | Admitting: Radiation Oncology

## 2018-08-24 MED ORDER — HYDROCODONE-ACETAMINOPHEN 5-325 MG PO TABS: 1.0000 | ORAL_TABLET | Freq: Four times a day (QID) | ORAL | 0 refills | Status: DC | PRN

## 2018-08-31 DIAGNOSIS — C44722 Squamous cell carcinoma of skin of right lower limb, including hip: Secondary | ICD-10-CM | POA: Diagnosis not present

## 2018-08-31 DIAGNOSIS — L988 Other specified disorders of the skin and subcutaneous tissue: Secondary | ICD-10-CM | POA: Diagnosis not present

## 2018-08-31 DIAGNOSIS — Z85828 Personal history of other malignant neoplasm of skin: Secondary | ICD-10-CM | POA: Diagnosis not present

## 2018-09-01 DIAGNOSIS — M48062 Spinal stenosis, lumbar region with neurogenic claudication: Secondary | ICD-10-CM | POA: Diagnosis not present

## 2018-09-01 DIAGNOSIS — M797 Fibromyalgia: Secondary | ICD-10-CM | POA: Diagnosis not present

## 2018-09-01 DIAGNOSIS — M5136 Other intervertebral disc degeneration, lumbar region: Secondary | ICD-10-CM | POA: Diagnosis not present

## 2018-09-02 ENCOUNTER — Ambulatory Visit (INDEPENDENT_AMBULATORY_CARE_PROVIDER_SITE_OTHER): Payer: Medicare Other | Admitting: Psychology

## 2018-09-02 ENCOUNTER — Other Ambulatory Visit: Payer: Self-pay | Admitting: Family Medicine

## 2018-09-02 ENCOUNTER — Ambulatory Visit: Payer: Medicare Other

## 2018-09-02 DIAGNOSIS — K219 Gastro-esophageal reflux disease without esophagitis: Secondary | ICD-10-CM

## 2018-09-02 DIAGNOSIS — C4492 Squamous cell carcinoma of skin, unspecified: Secondary | ICD-10-CM

## 2018-09-02 DIAGNOSIS — F331 Major depressive disorder, recurrent, moderate: Secondary | ICD-10-CM | POA: Diagnosis not present

## 2018-09-02 DIAGNOSIS — I890 Lymphedema, not elsewhere classified: Secondary | ICD-10-CM | POA: Diagnosis not present

## 2018-09-02 DIAGNOSIS — R2689 Other abnormalities of gait and mobility: Secondary | ICD-10-CM | POA: Diagnosis not present

## 2018-09-02 NOTE — Therapy (Signed)
[place], [place], [address] Phone: [phone]   Fax:  [phone]  Physical Therapy Treatment  Patient Details  Name: Mary Buck MRN: [id number] Date of Birth: 1936-02-28 Referring Provider (PT): Shona Simpson. PA-C   Encounter Date: 09/02/2018  PT End of Session - 09/02/18 1111    Visit Number  5    Number of Visits  12    Date for PT Re-Evaluation  09/15/18    PT Start Time  1020    PT Stop Time  1109    PT Time Calculation (min)  49 min    Activity Tolerance  Patient tolerated treatment well    Behavior During Therapy  WFL for tasks assessed/performed       Past Medical History:  Diagnosis Date  . Allergy   . Asthma   . Cancer ([place])   . Cataract   . DDD (degenerative disc disease), lumbar 06/01/2017  . Depression   . Heart murmur   . Hyperlipidemia   . Peripheral neuropathy 04/30/2017  . Thyroid disease     Past Surgical History:  Procedure Laterality Date  . bladder cancer  2013  . CARPAL TUNNEL RELEASE  2015  . CATARACT EXTRACTION Bilateral 2012  . CHOLECYSTECTOMY    . SALIVARY GLAND SURGERY Left 2015   benign  . TONSILLECTOMY AND ADENOIDECTOMY  1943    There were no vitals filed for this visit.  Subjective Assessment - 09/02/18 1039    Subjective  I still use my cane but only when we're out. My leg does get better, but it's slowly.     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    Currently in Pain?  Yes    Pain Score  4     Pain Location  Knee    Pain Orientation  Left    Pain Descriptors / Indicators  Aching;Dull;Constant    Pain Type  Acute pain    Pain Onset  More than a month ago    Pain Frequency  Constant    Aggravating Factors   not sure    Pain Relieving Factors  walking and the massage when I do it                  Outpatient Rehab from  08/04/2018 in [facility]  Lymphedema Life Impact Scale Total Score  8.82 %           [facility] Adult PT Treatment/Exercise - 09/02/18 0001      Self-Care   Other Self-Care Comments   Answered pts questions regrading the bodies inflammatory response and how she had a acute reaction to radiation causing the increased swelling. Also how we don't believe she has pure lymphedema, and instead was more inflammatory response from reaction to radiation. Encouraged her to be more proactive with self MLD 2-3x/day to promote lymphatic flow while her leg is still undergoing healing. She verbalized understanding this.      Manual Therapy   Manual Lymphatic Drainage (MLD)  Reviewed self MLD per handout with pt and patner today having her return therapist demo while sitting aross from her and PTA offering hand over hand pressure throughout to instruct in correct pressure and direction of stretch             PT Education - 09/02/18 1112    Education Details  Reviewed self MLD with  pt and partner    Person(s) Educated  Patient;Spouse    Methods  Explanation;Demonstration;Handout    Comprehension  Verbalized understanding;Returned demonstration;Tactile cues required          PT Long Term Goals - 09/02/18 1109      PT LONG TERM GOAL #1   Title  Pt will return to ambulating without AD    Baseline  Pt only ambulates with SPC with community ambulation now-09/02/18    Status  Partially Met      PT LONG TERM GOAL #2   Title  Pt will improve LLIS score to at least 18    Status  Partially Met      PT LONG TERM GOAL #3   Title  Pt will decrease circumferential measurements at the foot and ankle by 3cm or greater to match the Rt LE    Baseline  Did not measure this today but visibly much reduced and pt able to wear her sneakers again-09/02/18    Status  Partially Met      PT LONG TERM GOAL #4   Title  Pt will obtain and be knowledgeable about appropriate  compression garments     Baseline  Pt may not need these now as her swelling has drastically reduced as she continues to recover from radiation-09/02/18    Status  On-going            Plan - 09/02/18 1116    Clinical Impression Statement  Pts Lt LE continues to look much improved at each session as her tissue heals from radiation. See self care section for answering pts questions regarding this. Reviewed techique of self MLD with pt and partner today. Pt required alot of hand over hand cuing to instruct in correct direction and pressure of stretch, also not to slide on skin. She was able to return correct demo by end of session. New handout issued as well. Did not use Rt inguinal nodes as pt had another area of skin cancer removed this week at Rt lower leg.     Rehab Potential  Good    Clinical Impairments Affecting Rehab Potential  currently pain    PT Frequency  3x / week    PT Duration  6 weeks    PT Treatment/Interventions  ADLs/Self Care Home Management;Gait training;Therapeutic exercise;Patient/family education;Manual lymph drainage;Compression bandaging    PT Next Visit Plan  Pt may come for one more session in 2 weeks for final assess, recheck technique with self MLD; remeasure circumference for goal assess and address compression garments again if needed and skin well healed.     Consulted and Agree with Plan of Care  Patient;Family member/caregiver    Family Member Consulted  Partner, [name]       Patient will benefit from skilled therapeutic intervention in order to improve the following deficits and impairments:  Decreased skin integrity, Pain, Increased edema  Visit Diagnosis: Squamous cell skin cancer  Lymphedema, not elsewhere classified  Other abnormalities of gait and mobility     Problem List Patient Active Problem List   Diagnosis Date Noted  . Pedal edema 01/31/2018  . Cellulitis 01/31/2018  . Urinary hesitancy 01/31/2018  . Squamous cell skin cancer  12/16/2017  . Plantar fasciitis 07/07/2017  . DDD (degenerative disc disease), lumbar 06/01/2017  . Fibromyalgia 06/01/2017  . Spinal stenosis at L4-L5 level 04/30/2017  . Peripheral neuropathy 04/30/2017  . Encounter for medication monitoring 03/05/2017  . Bladder cancer ([place]) 08/20/2015  . Cervical stenosis  of spinal canal 07/27/2015  . Spinal stenosis of cervical region 07/27/2015  . Spondylolisthesis 07/27/2015  . Hypothyroid 07/05/2015  . Abdominal pain, acute, left upper quadrant 06/28/2015  . Basal cell carcinoma, face 06/28/2015  . Bilateral groin pain 06/28/2015  . Chronic pain 06/28/2015  . Fatigue 06/28/2015  . Hyperlipidemia 06/28/2015  . Pain in joints 06/28/2015  . Paresthesia of lower lip 06/28/2015  . Pulmonary nodule 06/28/2015  . Pustulosis palmaris et plantaris 06/28/2015  . Ulcer of nose 06/28/2015  . 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  . Mass of parotid gland 03/24/2014    Otelia Limes, PTA 09/02/2018, 11:20 AM  [place], [place], [address] Phone: [phone]   Fax:  [phone]  Name: Morgane Joerger MRN: [id number] Date of Birth: Aug 13, 1936

## 2018-09-02 NOTE — Patient Instructions (Signed)
Start with circles near neck above collarbones, 10 times each side.    Cancer Rehab [phone] Deep Effective Breath   Standing, sitting, or laying down place both hands on the belly. Take a deep breath IN, expanding the belly; then breath OUT, contracting the belly. Repeat __5__ times. Do __2-3__ sessions per day and before each self massage.   Inguinal Nodes to Axilla - Clear   On involved side, at armpit, make _5__ in-place circles. Then from hip proceed in sections to armpit with stationary circles or pumps _5_ times, this is your pathway. Do _1__ time per day.  Copyright  VHI. All rights reserved.  LEG: Knee to Hip - Clear   Pump up outer thigh of involved leg from knee to outer hip. Then do stationary circles from inner to outer thigh, then do outer thigh again. Next, interlace fingers behind knee IF ABLE and make in-place circles. Do _5_ times of each sequence.  Do _1__ time per day.  *below only after skin is healed* --------------------------------------------------------------------------------------------------------- LEG: Ankle to Hip Sweep   Hands on sides of ankle of involved leg, pump _5__ times up both sides of lower leg, then retrace steps up outer thigh to hip as before and back to pathway. Do _2-3_ times. Do __1_ time per day.  Copyright  VHI. All rights reserved.  FOOT: Dorsum of Foot and Toes Massage   One hand on top of foot make _5_ stationary circles or pumps, then either on top of toes or each individual toe do _5_ pumps. Then retrace all steps pumping back up both sides of lower leg, outer thigh, and then pathway. Finish with what you started with, _5_ circles at involved side arm pit. All _2-3_ times at each sequence. Do _1__ time per day.

## 2018-09-06 NOTE — Addendum Note (Signed)
Encounter addended by: Kyung Rudd, MD on: 09/06/2018 1:15 PM  Actions taken: Problem List modified, Visit diagnoses modified, Clinical Note Signed

## 2018-09-06 NOTE — Progress Notes (Signed)
  Radiation Oncology         [phone] ________________________________  Name: Mary Buck MRN: [id number]  Date: 06/15/2018  DOB: Jul 16, 1936  SIMULATION AND TREATMENT PLANNING NOTE  DIAGNOSIS:     ICD-10-CM   1. Squamous cell carcinoma of skin of left lower limb, including hip C44.729      Site:  Left leg  NARRATIVE:  The patient was brought to the [facility].  Identity was confirmed.  All relevant records and images related to the planned course of therapy were reviewed.   Written consent to proceed with treatment was confirmed which was freely given after reviewing the details related to the planned course of therapy had been reviewed with the patient.  Then, the patient was set-up in a stable reproducible  supine position for radiation therapy.  CT images were obtained.  Surface markings were placed.    Medically necessary complex treatment device(s) for immobilization:  accuform cushion device.   The CT images were loaded into the planning software.  Then the target and avoidance structures were contoured.  Treatment planning then occurred.  The radiation prescription was entered and confirmed.   I have requested : Intensity Modulated Radiotherapy (IMRT) is medically necessary for this case for the following reason:  Dose homogeneity/ sparing of adjacent normal structures bone marrow and lymph channels.   PLAN:  The patient will receive 60 Gy in 30 fractions.  ________________________________   Jodelle Gross, MD, PhD

## 2018-09-07 ENCOUNTER — Telehealth: Payer: Self-pay | Admitting: *Deleted

## 2018-09-07 NOTE — Telephone Encounter (Signed)
Received Dermatopathology Report results from [facility]; forwarded to provider/SLS 12/24

## 2018-09-17 ENCOUNTER — Encounter: Payer: Self-pay | Admitting: Family Medicine

## 2018-09-17 ENCOUNTER — Ambulatory Visit (INDEPENDENT_AMBULATORY_CARE_PROVIDER_SITE_OTHER): Payer: Medicare Other | Admitting: Family Medicine

## 2018-09-17 VITALS — BP 130/80 | HR 68 | Temp 98.0°F | Ht 66.0 in | Wt 177.1 lb

## 2018-09-17 DIAGNOSIS — T66XXXS Radiation sickness, unspecified, sequela: Secondary | ICD-10-CM

## 2018-09-17 DIAGNOSIS — R600 Localized edema: Secondary | ICD-10-CM | POA: Diagnosis not present

## 2018-09-17 NOTE — Progress Notes (Signed)
Mary Buck is a 83 y.o. female  Chief Complaint  Patient presents with  . sores on leg    HPI: Mary Buck is a 83 y.o. female here with her partner and complains of sores on her Lt > Rt LE, swelling of RLE. 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 Therapy course was cut short d/t radiation side effect - "burn" - to her leg. Overall leg/skin is improving as per pt and partner. Pt states swelling in LLE has improved but she now notes some swelling in RLE. No calf pain/tenderness. No redness. No fever, chills. Pt had 1 episode of feeling cold a few days ago. No SOB. Pt just wants me to make sure legs are not infected and wants to know what she can use on "itchy bumps" on her RLE.   Past Medical History:  Diagnosis Date  . Allergy   . Asthma   . Cancer ([place])   . Cataract   . DDD (degenerative disc disease), lumbar 06/01/2017  . Depression   . Heart murmur   . Hyperlipidemia   . Peripheral neuropathy 04/30/2017  . Thyroid disease     Past Surgical History:  Procedure Laterality Date  . bladder cancer  2013  . CARPAL TUNNEL RELEASE  2015  . CATARACT EXTRACTION Bilateral 2012  . CHOLECYSTECTOMY    . SALIVARY GLAND SURGERY Left 2015   benign  . TONSILLECTOMY AND ADENOIDECTOMY  1943    Social History   Socioeconomic History  . Marital status: Significant Other    Spouse name: Not on file  . Number of children: Not on file  . Years of education: Not on file  . Highest education level: Not on file  Occupational History  . Not on file  Social Needs  . Financial resource strain: Not on file  . Food insecurity:    Worry: Not on file    Inability: Not on file  . Transportation needs:    Medical: Not on file    Non-medical: Not on file  Tobacco Use  . Smoking status: Former Smoker    Last attempt to quit: 2015    Years since quitting: 5.0  . Smokeless tobacco: Never Used  Substance and Sexual Activity  . Alcohol use: No  . Drug  use: No  . Sexual activity: Not on file  Lifestyle  . Physical activity:    Days per week: Not on file    Minutes per session: Not on file  . Stress: Not on file  Relationships  . Social connections:    Talks on phone: Not on file    Gets together: Not on file    Attends religious service: Not on file    Active member of club or organization: Not on file    Attends meetings of clubs or organizations: Not on file    Relationship status: Not on file  . Intimate partner violence:    Fear of current or ex partner: Not on file    Emotionally abused: Not on file    Physically abused: Not on file    Forced sexual activity: Not on file  Other Topics Concern  . Not on file  Social History Narrative  . Not on file    Family History  Problem Relation Age of Onset  . Hyperlipidemia Father   . Stroke Father      Immunization History  Administered Date(s) Administered  . Influenza Split 06/15/2009, 05/29/2011, 06/14/2013  .  Influenza, High Dose Seasonal PF 05/21/2010, 06/30/2014, 06/14/2017  . Influenza, Seasonal, Injecte, Preservative Fre 05/21/2010  . Influenza,inj,Quad PF,6+ Mos 06/18/2015  . Influenza,inj,quad, With Preservative 06/30/2014  . Influenza-Unspecified 06/15/2009, 05/29/2011, 10/21/2012, 06/14/2013, 06/18/2015, 05/31/2018  . Pneumococcal Conjugate-13 08/27/2004, 10/05/2014  . Pneumococcal Polysaccharide-23 08/27/2004  . Pneumococcal-Unspecified 08/27/2004  . Tdap 12/13/2009  . Zoster 09/21/2013    Outpatient Encounter Medications as of 09/17/2018  Medication Sig  . Calcium Carbonate-Vitamin D (LIQUID CALCIUM/VITAMIN D PO) Take by mouth.  . clobetasol cream (TEMOVATE) 6.22 % Apply 1 application topically 2 (two) times daily.  Marland Kitchen gabapentin (NEURONTIN) 300 MG capsule Take 1 capsule (300 mg total) by mouth 3 (three) times daily.  . hydrochlorothiazide (HYDRODIURIL) 12.5 MG tablet Take 1 tablet (12.5 mg total) by mouth daily. Use daily as needed for swelling  .  HYDROcodone-acetaminophen (NORCO) 5-325 MG tablet Take 1-2 tablets by mouth every 6 (six) hours as needed for moderate pain.  Marland Kitchen lactobacillus acidophilus (BACID) TABS tablet Take 1 tablet by mouth daily.   . lansoprazole (PREVACID) 30 MG capsule TAKE ONE CAPSULE BY MOUTH TWICE DAILY BEFORE A MEAL.  Marland Kitchen levothyroxine (SYNTHROID, LEVOTHROID) 88 MCG tablet TAKE 1 TABLET(88 MCG) BY MOUTH DAILY BEFORE BREAKFAST  . simvastatin (ZOCOR) 20 MG tablet TAKE 1 TABLET BY MOUTH DAILY  . triamcinolone cream (KENALOG) 0.1 % Apply 1 application topically 2 (two) times daily. Use as needed for spots on your skin   No facility-administered encounter medications on file as of 09/17/2018.      ROS: Pertinent positives and negatives noted in HPI. Remainder of ROS non-contributory   Allergies  Allergen Reactions  . Cephalexin     Other reaction(s): unsure  . Contrast Media [Iodinated Diagnostic Agents]   . Latex     Other reaction(s): blistering, redness   . Mirtazapine     Other reaction(s): nausea  . Penicillins   . Sulfa Antibiotics   . Sulfur Other (See Comments)    BP 130/80   Pulse 68   Temp 98 F (36.7 C) (Oral)   Ht 5\' 6"  (1.676 m)   Wt 177 lb 2 oz (80.3 kg)   SpO2 97%   BMI 28.59 kg/m   Physical Exam  Constitutional: She is oriented to person, place, and time. She appears well-developed and well-nourished. No distress.  Musculoskeletal:        General: Edema (+1 pedal edema in RLE, neg homans, no erythema or warmth, good ROM) present.  Neurological: She is alert and oriented to person, place, and time.  Skin:  Venous stasis changes B/L (Lt > Rt) B/L LE   Psychiatric: She has a normal mood and affect. Her behavior is normal.     A/P:  1. Edema of right lower extremity - no calf tenderness, erythema, TTP - elevated leg, compression stocking  2. Adverse effect of radiation therapy, sequela - no evidence of infection on exam, normal vitals - f/u with onc, derm as  scheduled

## 2018-09-21 DIAGNOSIS — L0889 Other specified local infections of the skin and subcutaneous tissue: Secondary | ICD-10-CM | POA: Diagnosis not present

## 2018-09-21 DIAGNOSIS — L738 Other specified follicular disorders: Secondary | ICD-10-CM | POA: Diagnosis not present

## 2018-09-21 DIAGNOSIS — Z85828 Personal history of other malignant neoplasm of skin: Secondary | ICD-10-CM | POA: Diagnosis not present

## 2018-09-21 DIAGNOSIS — L0109 Other impetigo: Secondary | ICD-10-CM | POA: Diagnosis not present

## 2018-09-24 DIAGNOSIS — M5136 Other intervertebral disc degeneration, lumbar region: Secondary | ICD-10-CM | POA: Diagnosis not present

## 2018-09-24 DIAGNOSIS — M48062 Spinal stenosis, lumbar region with neurogenic claudication: Secondary | ICD-10-CM | POA: Diagnosis not present

## 2018-09-24 DIAGNOSIS — M797 Fibromyalgia: Secondary | ICD-10-CM | POA: Diagnosis not present

## 2018-09-24 DIAGNOSIS — G894 Chronic pain syndrome: Secondary | ICD-10-CM | POA: Diagnosis not present

## 2018-09-27 ENCOUNTER — Ambulatory Visit: Payer: Self-pay | Admitting: Radiation Oncology

## 2018-09-27 ENCOUNTER — Ambulatory Visit (INDEPENDENT_AMBULATORY_CARE_PROVIDER_SITE_OTHER): Payer: Medicare Other | Admitting: Psychology

## 2018-09-27 DIAGNOSIS — F331 Major depressive disorder, recurrent, moderate: Secondary | ICD-10-CM | POA: Diagnosis not present

## 2018-10-05 DIAGNOSIS — I8311 Varicose veins of right lower extremity with inflammation: Secondary | ICD-10-CM | POA: Diagnosis not present

## 2018-10-05 DIAGNOSIS — I8312 Varicose veins of left lower extremity with inflammation: Secondary | ICD-10-CM | POA: Diagnosis not present

## 2018-10-05 DIAGNOSIS — I872 Venous insufficiency (chronic) (peripheral): Secondary | ICD-10-CM | POA: Diagnosis not present

## 2018-10-05 DIAGNOSIS — Z85828 Personal history of other malignant neoplasm of skin: Secondary | ICD-10-CM | POA: Diagnosis not present

## 2018-10-05 DIAGNOSIS — L98499 Non-pressure chronic ulcer of skin of other sites with unspecified severity: Secondary | ICD-10-CM | POA: Diagnosis not present

## 2018-10-06 ENCOUNTER — Other Ambulatory Visit: Payer: Self-pay

## 2018-10-06 ENCOUNTER — Ambulatory Visit
Admission: RE | Admit: 2018-10-06 | Discharge: 2018-10-06 | Disposition: A | Discharge status: Home / Self Care | Payer: Medicare Other | Source: Ambulatory Visit | Attending: Radiation Oncology | Admitting: Radiation Oncology

## 2018-10-06 ENCOUNTER — Encounter: Payer: Self-pay | Admitting: Radiation Oncology

## 2018-10-06 VITALS — BP 140/65 | HR 78 | Temp 97.8°F | Resp 20 | Ht 65.5 in | Wt 171.0 lb

## 2018-10-06 DIAGNOSIS — Z923 Personal history of irradiation: Secondary | ICD-10-CM | POA: Insufficient documentation

## 2018-10-06 DIAGNOSIS — C44729 Squamous cell carcinoma of skin of left lower limb, including hip: Secondary | ICD-10-CM | POA: Diagnosis not present

## 2018-10-06 DIAGNOSIS — Z79899 Other long term (current) drug therapy: Secondary | ICD-10-CM | POA: Insufficient documentation

## 2018-10-06 NOTE — Progress Notes (Signed)
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     Interval Since Last Radiation:  11 weeks   06/28/18 - 07/23/18: Photon external beam radiotherapy to  Lower Left Leg (skin) / she was only able to complete a course of 19 fractions (Total of 38 Gy) of the prescribed dose of 60 Gy in 30 fractions due to swelling, pain, and skin side effects.  Narrative:  The patient returns today for routine follow-up.  Overall she has had a prolonged course, and much more symptomatology than anticipated. Her course had to be administered with photons rather than electrons due to the diffuse nature of her field. She was seen by PCP to rule out CHF, and had ultrasound that was negative for DVT. She had been on pain medication, and has been seen by physical therapy. She was unable to tolerate wrapping the extremity until mid December. She was due to continue ongoing PT, but has not been back that I can tell since 09/02/18. She did have some concerns with a lesion in the right lower extremity, and while we did consider offering treatment, her left lower extremity took the priority. She has since met back with Dr. Ronnald Ramp in dermatology and had the right LE lesion resected. She also was treated by him about three weeks ago for a staph infection of her LLE with Doxycycline. She reports she saw him yesterday and is due to see him again in a few weeks.                         On review of systems, the patient states she continues to have pain and heaviness in her LLE but that it is significantly improved since completing treatment and since antibiotic therapy. She denies any fevers, chills, or bleeding of the skin. She has one open site in the lateral  aspect of the LLE that she continues to dress this daily. No chest pain or shortness of breath is noted. No other complaints are noted.  ALLERGIES:  is allergic to cephalexin; contrast media [iodinated diagnostic agents]; latex; mirtazapine; penicillins; sulfa antibiotics; and sulfur.  Meds: Current Outpatient Medications  Medication Sig Dispense Refill  . gabapentin (NEURONTIN) 300 MG capsule Take 1 capsule (300 mg total) by mouth 3 (three) times daily. 90 capsule 4  . HYDROcodone-acetaminophen (NORCO) 5-325 MG tablet Take 1-2 tablets by mouth every 6 (six) hours as needed for moderate pain. 60 tablet 0  . lansoprazole (PREVACID) 30 MG capsule TAKE ONE CAPSULE BY MOUTH TWICE DAILY BEFORE A MEAL. 180 capsule 3  . levothyroxine (SYNTHROID, LEVOTHROID) 88 MCG tablet TAKE 1 TABLET(88 MCG) BY MOUTH DAILY BEFORE BREAKFAST 90 tablet 1  . simvastatin (ZOCOR) 20 MG tablet TAKE 1 TABLET BY MOUTH DAILY 90 tablet 1  . Calcium Carbonate-Vitamin D (LIQUID CALCIUM/VITAMIN D PO) Take by mouth.    . clobetasol cream (TEMOVATE) 9.56 % Apply 1 application topically 2 (two) times daily.    . hydrochlorothiazide (HYDRODIURIL) 12.5 MG tablet Take 1 tablet (12.5 mg total) by mouth daily. Use daily as needed for swelling (Patient not taking: Reported on 10/06/2018) 30 tablet 3  . lactobacillus acidophilus (BACID) TABS tablet Take 1 tablet by mouth  daily.     . triamcinolone cream (KENALOG) 0.1 % Apply 1 application topically 2 (two) times daily. Use as needed for spots on your skin (Patient not taking: Reported on 10/06/2018) 45 g 1   No current facility-administered medications for this encounter.     Physical Findings:  height is 5' 5.5" (1.664 m) and weight is 171 lb (77.6 kg). Her oral temperature is 97.8 F (36.6 C). Her blood pressure is 140/65 and her pulse is 78. Her respiration is 20 and oxygen saturation is 100%.  Pain Assessment Pain Score: 3  Pain Loc: Leg/10 In general this is a well appearing  caucasian female in no acute distress. She's alert and oriented x4 and appropriate throughout the examination. Cardiopulmonary assessment is negative for acute distress and she exhibits normal effort. Her RLE has a well healing incision along the tibial tuberosity about 3 cm below the patella. No erythema is noted. The LLE however is densely edematous 2+ edema is noted, and no weeping of the skin is noted however she has thick, keratotic skin, nodular and woody appearing skin along the treatment site, but even so more diffusely. She has an open site along the lateral aspect that is undressed and reveals about 1.5 cm of an opening with fibrin in the wound bed. This is only about 2 mm deep. No cellultic changes are noted, but the entire lower extremity below the knee is more erythematous.     Lab Findings: Lab Results  Component Value Date   WBC 5.2 01/28/2018   HGB 11.8 (L) 01/28/2018   HCT 35.7 (L) 01/28/2018   MCV 92.5 01/28/2018   PLT 228.0 01/28/2018     Radiographic Findings: No results found.  Impression/Plan: 1. 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 The patient has had a very difficult time with her radiotherapy. I truly believe her skin and edematous changes are multifactorial. I'd recommend she meet back with PT for evaluation. I think she would also benefit by meeting vascular surgery because she could have an underlying venous stasis issue. I assured her that her results are not typical of radiotherapy, but the lymphatic congestion certainly could have been made worse by the treatment she received. While it is reassuring that her treated lesions do appear to be slightly better, since she did not receive a full course of therapy, there may be more needed to address her skin cancer. We did discuss that more surgery or more radiation could also contribute to her lymphatic congestion/lymphedema. I encouraged her to continue her current skin care  and dressing regimen, and that I would get her back in to see PT, and talk with Dr. Ronnald Ramp. If he's in agreement for vascular surgery referral, we can coordinate as well. Dr. Lisbeth Renshaw has also weighed in and has indicated that she could have more radiotherapy if this was necessary but that the site of treatment would need to be more focal given her side effects. She is in agreement. She is also aware that without lymphatic massage and compression, I fear that her LLE may remain status quo or progress and remain a life long problem.     Carola Rhine, PAC

## 2018-10-06 NOTE — Addendum Note (Signed)
Encounter addended by: Hayden Pedro, PA-C on: 10/06/2018 12:30 PM  Actions taken: Medication taking status modified, Diagnosis association updated

## 2018-10-06 NOTE — Addendum Note (Signed)
Encounter addended by: Hayden Pedro, PA-C on: 10/06/2018 11:48 AM  Actions taken: LOS modified

## 2018-10-07 ENCOUNTER — Telehealth: Payer: Self-pay | Admitting: Radiation Oncology

## 2018-10-07 DIAGNOSIS — C44729 Squamous cell carcinoma of skin of left lower limb, including hip: Secondary | ICD-10-CM

## 2018-10-07 NOTE — Telephone Encounter (Signed)
I called to let the patient know I discussed her case with Dr. Ronnald Ramp and he was in agreement to ask PT to reassess her for lymphedema issues, as well as to see vascular surgery to see if she has PVD and venous stasis. We will follow along with her and see her back as needed. She is in agreement. She also wanted to make sure we knew she had some loose stools since her abx course of doxycycline. I let her know she could try OTC probiotics and live culture yogurt, but if her symptoms don't improve, to check either with Dr. Ronnald Ramp since he gave her the rx, or to see PCP to rule out infectious diarrhea. She is in agreement.

## 2018-10-11 ENCOUNTER — Other Ambulatory Visit: Payer: Self-pay

## 2018-10-11 DIAGNOSIS — R609 Edema, unspecified: Secondary | ICD-10-CM

## 2018-10-12 ENCOUNTER — Ambulatory Visit (HOSPITAL_COMMUNITY)
Admission: RE | Admit: 2018-10-12 | Discharge: 2018-10-12 | Disposition: A | Discharge status: Home / Self Care | Payer: Medicare Other | Source: Ambulatory Visit | Attending: Family | Admitting: Family

## 2018-10-12 DIAGNOSIS — R609 Edema, unspecified: Secondary | ICD-10-CM | POA: Insufficient documentation

## 2018-10-13 ENCOUNTER — Ambulatory Visit (INDEPENDENT_AMBULATORY_CARE_PROVIDER_SITE_OTHER): Payer: Medicare Other | Admitting: Vascular Surgery

## 2018-10-13 ENCOUNTER — Other Ambulatory Visit: Payer: Self-pay

## 2018-10-13 ENCOUNTER — Encounter: Payer: Self-pay | Admitting: Vascular Surgery

## 2018-10-13 ENCOUNTER — Ambulatory Visit: Payer: Medicare Other | Attending: Radiation Oncology

## 2018-10-13 ENCOUNTER — Telehealth: Payer: Self-pay

## 2018-10-13 VITALS — BP 139/70 | HR 65 | Temp 97.0°F | Resp 20 | Ht 65.5 in | Wt 171.0 lb

## 2018-10-13 DIAGNOSIS — R2689 Other abnormalities of gait and mobility: Secondary | ICD-10-CM | POA: Insufficient documentation

## 2018-10-13 DIAGNOSIS — I872 Venous insufficiency (chronic) (peripheral): Secondary | ICD-10-CM | POA: Diagnosis not present

## 2018-10-13 DIAGNOSIS — C4492 Squamous cell carcinoma of skin, unspecified: Secondary | ICD-10-CM | POA: Diagnosis not present

## 2018-10-13 DIAGNOSIS — I890 Lymphedema, not elsewhere classified: Secondary | ICD-10-CM | POA: Insufficient documentation

## 2018-10-13 NOTE — Telephone Encounter (Signed)
Ivin Booty do you have fax?

## 2018-10-13 NOTE — Progress Notes (Signed)
REASON FOR CONSULT:    Left lower extremity swelling.  The consult is requested by Shona Simpson, PA  HPI:   Mary Buck is a pleasant 83 y.o. female, who is referred with left lower extremity swelling.  She had some cancerous lesions on her left leg and it was felt that the best option for treatment was radiation therapy.  She has had some mild swelling in the left leg associated with this and has also developed a very small wound on the lateral aspect of her distal left leg.  The patient denies any previous history of DVT or phlebitis.  She does describe some aching pain and heaviness in her legs with standing which is relieved with elevation.  She has worn some compression stockings but these were not medical grade compression stockings.  She denies any pleuritic chest pain or shortness of breath.  Past Medical History:  Diagnosis Date  . Allergy   . Asthma   . Cancer ([place])   . Cataract   . DDD (degenerative disc disease), lumbar 06/01/2017  . Depression   . Heart murmur   . Hyperlipidemia   . Peripheral neuropathy 04/30/2017  . Thyroid disease     Family History  Problem Relation Age of Onset  . Hyperlipidemia Father   . Stroke Father     SOCIAL HISTORY: Social History   Socioeconomic History  . Marital status: Significant Other    Spouse name: Not on file  . Number of children: Not on file  . Years of education: Not on file  . Highest education level: Not on file  Occupational History  . Not on file  Social Needs  . Financial resource strain: Not on file  . Food insecurity:    Worry: Not on file    Inability: Not on file  . Transportation needs:    Medical: Not on file    Non-medical: Not on file  Tobacco Use  . Smoking status: Former Smoker    Last attempt to quit: 2015    Years since quitting: 5.0  . Smokeless tobacco: Never Used  Substance and Sexual Activity  . Alcohol use: No  . Drug use: No  . Sexual activity: Not on file  Lifestyle  .  Physical activity:    Days per week: Not on file    Minutes per session: Not on file  . Stress: Not on file  Relationships  . Social connections:    Talks on phone: Not on file    Gets together: Not on file    Attends religious service: Not on file    Active member of club or organization: Not on file    Attends meetings of clubs or organizations: Not on file    Relationship status: Not on file  . Intimate partner violence:    Fear of current or ex partner: Not on file    Emotionally abused: Not on file    Physically abused: Not on file    Forced sexual activity: Not on file  Other Topics Concern  . Not on file  Social History Narrative  . Not on file    Allergies  Allergen Reactions  . Cephalexin     Other reaction(s): unsure  . Contrast Media [Iodinated Diagnostic Agents]   . Latex     Other reaction(s): blistering, redness   . Mirtazapine     Other reaction(s): nausea  . Penicillins   . Sulfa Antibiotics   . Sulfur Other (See Comments)  Current Outpatient Medications  Medication Sig Dispense Refill  . Calcium Carbonate-Vitamin D (LIQUID CALCIUM/VITAMIN D PO) Take by mouth.    . gabapentin (NEURONTIN) 300 MG capsule Take 1 capsule (300 mg total) by mouth 3 (three) times daily. 90 capsule 4  . HYDROcodone-acetaminophen (NORCO) 5-325 MG tablet Take 1-2 tablets by mouth every 6 (six) hours as needed for moderate pain. 60 tablet 0  . lactobacillus acidophilus (BACID) TABS tablet Take 1 tablet by mouth daily.     . lansoprazole (PREVACID) 30 MG capsule TAKE ONE CAPSULE BY MOUTH TWICE DAILY BEFORE A MEAL. 180 capsule 3  . levothyroxine (SYNTHROID, LEVOTHROID) 88 MCG tablet TAKE 1 TABLET(88 MCG) BY MOUTH DAILY BEFORE BREAKFAST 90 tablet 1  . simvastatin (ZOCOR) 20 MG tablet TAKE 1 TABLET BY MOUTH DAILY 90 tablet 1   No current facility-administered medications for this visit.     REVIEW OF SYSTEMS:  [X]  denotes positive finding, [ ]  denotes negative finding Cardiac   Comments:  Chest pain or chest pressure:    Shortness of breath upon exertion:    Short of breath when lying flat:    Irregular heart rhythm:        Vascular    Pain in calf, thigh, or hip brought on by ambulation: x   Pain in feet at night that wakes you up from your sleep:     Blood clot in your veins:    Leg swelling:  x       Pulmonary    Oxygen at home:    Productive cough:     Wheezing:         Neurologic    Sudden weakness in arms or legs:     Sudden numbness in arms or legs:     Sudden onset of difficulty speaking or slurred speech:    Temporary loss of vision in one eye:     Problems with dizziness:  x       Gastrointestinal    Blood in stool:     Vomited blood:         Genitourinary    Burning when urinating:     Blood in urine:        Psychiatric    Major depression:         Hematologic    Bleeding problems:    Problems with blood clotting too easily:        Skin    Rashes or ulcers:        Constitutional    Fever or chills:     PHYSICAL EXAM:   Vitals:   10/13/18 1321  BP: 139/70  Pulse: 65  Resp: 20  Temp: (!) 97 F (36.1 C)  SpO2: 96%  Weight: 171 lb (77.6 kg)  Height: 5' 5.5" (1.664 m)   GENERAL: The patient is a well-nourished female, in no acute distress. The vital signs are documented above. CARDIAC: There is a regular rate and rhythm.  VASCULAR: I do not detect carotid bruits. She has palpable femoral, popliteal, and pedal pulses bilaterally. She has mild bilateral lower extremity swelling. She has some mild hyperpigmentation in the right leg with some mild erythema on the left leg. PULMONARY: There is good air exchange bilaterally without wheezing or rales. ABDOMEN: Soft and non-tender with normal pitched bowel sounds.  MUSCULOSKELETAL: There are no major deformities or cyanosis. NEUROLOGIC: No focal weakness or paresthesias are detected. SKIN: There is 1 small superficial wound on the lateral aspect of her  distal left leg.   There is no significant drainage. PSYCHIATRIC: The patient has a normal affect.  DATA:    VENOUS DUPLEX: I have independently interpreted her venous duplex scan today.  This was a study of the left lower extremity.  This showed no evidence of DVT or superficial thrombophlebitis.  There was no significant deep venous reflux.  There was only some reflux in the mid segment of the small saphenous vein.   ASSESSMENT & PLAN:   CHRONIC VENOUS INSUFFICIENCY: The patient's exam does suggest evidence of some mild chronic venous insufficiency.  In addition she may have some lymphedema secondary to her radiation therapy.  I have explained that regardless the treatment is the same.  We have discussed the importance of intermittent leg elevation the proper positioning for this.  I have written her a prescription for a mild compression stocking.  I have encouraged her to avoid prolonged sitting and standing.  We have discussed the importance of exercise specifically walking and water aerobics once her wound is healed.  She does have some reflux and a small segment of the left small saphenous vein but currently she is not a candidate for endovenous laser ablation of a small segment of reflux.  Certainly if her venous disease progresses in the future we could reevaluate this.  I be happy to see her back at any time if her swelling her symptoms progress.  A total of 45 minutes was spent on this visit. 25 minutes was face to face time. More than 50% of the time was spent on counseling and coordinating with the patient.    Deitra Mayo [facility] [phone]

## 2018-10-13 NOTE — Therapy (Signed)
Mary Buck, [place], [address] Phone: [phone]   Fax:  [phone]  Physical Therapy Treatment  Patient Details  Name: Mary Buck MRN: [id number] Date of Birth: 06-25-1936 Referring Provider (PT): Shona Simpson. PA-C   Encounter Date: 10/13/2018  PT End of Session - 10/13/18 1522    Visit Number  6    Number of Visits  20    Date for PT Re-Evaluation  11/10/18    PT Start Time  1435    PT Stop Time  1522    PT Time Calculation (min)  47 min    Activity Tolerance  Patient tolerated treatment well    Behavior During Therapy  [facility] for tasks assessed/performed       Past Medical History:  Diagnosis Date  . Allergy   . Asthma   . Cancer ([place])   . Cataract   . DDD (degenerative disc disease), lumbar 06/01/2017  . Depression   . Heart murmur   . Hyperlipidemia   . Peripheral neuropathy 04/30/2017  . Thyroid disease     Past Surgical History:  Procedure Laterality Date  . bladder cancer  2013  . CARPAL TUNNEL RELEASE  2015  . CATARACT EXTRACTION Bilateral 2012  . CHOLECYSTECTOMY    . SALIVARY GLAND SURGERY Left 2015   benign  . TONSILLECTOMY AND ADENOIDECTOMY  1943    There were no vitals filed for this visit.  Subjective Assessment - 10/13/18 1441    Subjective  I still use my cane but I don't rely on it as much as I keep it with me so people don't bump into my leg. I start [name] 10/19/18 in [facility] Just left from seeing vascular doctor, Dr. Scot Dock, and he ruled out blood clot with [place] and said even though I'm healing slow, it's normal and to be expected due to the extent of radiation burning that was sustained.     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    Patient Stated Goals  be strong enough to go for a long walk and not be in pain after    Currently in Pain?  Yes    Pain  Score  3     Pain Location  Leg    Pain Orientation  Left;Right;Lower;Medial   right doesn't normally hurt, but today site of recent melanoma removal is painful)   Pain Descriptors / Indicators  Aching    Pain Type  Acute pain    Pain Onset  More than a month ago    Pain Frequency  Intermittent    Aggravating Factors   just always hurts    Pain Relieving Factors  walking            LYMPHEDEMA/ONCOLOGY QUESTIONNAIRE - 10/13/18 1451      Left Lower Extremity Lymphedema   At Midpatella/Popliteal Crease  43.4 cm    30 cm Proximal to Floor at Lateral Plantar Foot  35.3 cm    20 cm Proximal to Floor at Lateral Plantar Foot  26.4 cm    10 cm Proximal to Floor at Lateral Malleoli  24.1 cm    5 cm Proximal to 1st MTP Joint  22.1 cm    Across MTP Joint  21.7 cm    Around Proximal Great Toe  7.5 cm           Outpatient Rehab from 08/04/2018 [facility]  Lymphedema Life Impact Scale Total Score  8.82 %           [facility] Adult PT Treatment/Exercise - 10/13/18 0001      Self-Care   Other Self-Care Comments   Spent time answering pt and partners questions about radiation fibrosis and how this is giving pt the "wooden" feeling in her Lt lower leg. Also how healing will be slowed and delayed due to the extent of the damage from radiation and but MLD, wearing compression stockings MD suggested, and starting more consistent exercising with [employer] that pt is doing every she can to promote a good healing environment of her tissue.       Manual Therapy   Manual Lymphatic Drainage (MLD)  In Supine: Short neck, superficial and deep abdominals, Lt axillary nodes, Lt inguino-axillary anastomosis, and then Lt LE working from proximal thigh to mid lower leg (pt had bandage over still healing sore and was VERY tender to touch below that) then retracing all steps.                   PT Long Term Goals - 10/13/18 1446      PT LONG TERM GOAL #1    Title  Pt will return to ambulating without AD    Baseline  Pt only ambulates with SPC with community ambulation now-09/02/18; reports using cane 30% of time now-10/12/18    Status  Partially Met      PT LONG TERM GOAL #2   Title  Pt will improve LLIS score to at least 18      PT LONG TERM GOAL #4   Title  Pt will obtain and be knowledgeable about appropriate compression garments     Baseline  Pt may not need these now as her swelling has drastically reduced as she continues to recover from radiation-09/02/18; for now Dr. Scot Dock wants pt to wear 8-15 mmHg as her skin is still healing from radiation-10/12/18            Plan - 10/13/18 1708    Clinical Impression Statement  Pt returns after 1 month per Shona Simpson, PA-C. She is having c/o of her Lt lower leg feeling "wooden" and stiff since radiation ending 2 months ago. 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 After session of manual lymph drainage today pt reported noting some relief and would like to resume coming at least 1x/wk for now (but will consider 2x/wk if deemed very beneficial, just doesn't want to be overwhelmed by appts) to see if this will help alleviate some of the residual pain and discomfort she is still experiencing daily. Her circumference measurements have improved well since initial flare up from radiation treatment so pt was encouraged by this. She plans to get 8-15 mmHg compression stockings for now per her vascualr doctors request (Dr. Scot Dock). Also she is signed up for [employer] at the [facility] and is due to start this next month. Also educated them on how this could help promote further healing as with exercise she will be stimulating healing and regeneration of tissue. They verbalized understanding all.     Rehab Potential  Good    Clinical Impairments  Affecting Rehab Potential  currently pain, still healing sore at Lt lateral lower leg from radiation    PT  Frequency  2x / week    PT Duration  4 weeks    PT Treatment/Interventions  ADLs/Self Care Home Management;Gait training;Therapeutic exercise;Patient/family education;Manual lymph drainage;Compression bandaging    PT Next Visit Plan  Renewal done today for up to 2x/wk, though for now pt only wants 1x/wk (doesn't want to get overwhlemed with appts). Cont with Lt LE manual lymph drainage being mindful of healing, sensitive skin at lowest part of leg. Did she get compression stockings?    Consulted and Agree with Plan of Care  Patient;Family member/caregiver    Family Member Consulted  Partner, [name]       Patient will benefit from skilled therapeutic intervention in order to improve the following deficits and impairments:  Decreased skin integrity, Pain, Increased edema  Visit Diagnosis: Squamous cell skin cancer  Lymphedema, not elsewhere classified  Other abnormalities of gait and mobility     Problem List Patient Active Problem List   Diagnosis Date Noted  . Pedal edema 01/31/2018  . Cellulitis 01/31/2018  . Urinary hesitancy 01/31/2018  . Squamous cell carcinoma of skin of left lower limb, including hip 12/16/2017  . Plantar fasciitis 07/07/2017  . DDD (degenerative disc disease), lumbar 06/01/2017  . Fibromyalgia 06/01/2017  . Spinal stenosis at L4-L5 level 04/30/2017  . Peripheral neuropathy 04/30/2017  . Encounter for medication monitoring 03/05/2017  . Bladder cancer ([place]) 08/20/2015  . Cervical stenosis of spinal canal 07/27/2015  . Spinal stenosis of cervical region 07/27/2015  . Spondylolisthesis 07/27/2015  . Hypothyroid 07/05/2015  . Abdominal pain, acute, left upper quadrant 06/28/2015  . Basal cell carcinoma, face 06/28/2015  . Bilateral groin pain 06/28/2015  . Chronic pain 06/28/2015  . Fatigue 06/28/2015  . Hyperlipidemia 06/28/2015  . Pain in  joints 06/28/2015  . Paresthesia of lower lip 06/28/2015  . Pulmonary nodule 06/28/2015  . Pustulosis palmaris et plantaris 06/28/2015  . Ulcer of nose 06/28/2015  . 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  . Mass of parotid gland 03/24/2014    Otelia Limes, PTA 10/13/2018, 5:27 PM  [place], [place], [address] Phone: [phone]   Fax:  [phone]  Name: Valita Righter MRN: [id number] Date of Birth: Feb 06, 1936

## 2018-10-13 NOTE — Telephone Encounter (Signed)
Copied from [facility] [phone]. Topic: General - Inquiry >> Oct 13, 2018 10:05 AM Conception Chancy, NT wrote: Reason for CRM: Nevin Bloodgood is calling from [facility] in [facility] needing to know if the office received medical clearance form she faxed over on Monday. 10/11/18  [phone]

## 2018-10-14 ENCOUNTER — Ambulatory Visit (INDEPENDENT_AMBULATORY_CARE_PROVIDER_SITE_OTHER): Payer: Medicare Other | Admitting: Psychology

## 2018-10-14 DIAGNOSIS — F331 Major depressive disorder, recurrent, moderate: Secondary | ICD-10-CM

## 2018-10-14 NOTE — Telephone Encounter (Signed)
Received Medical Clearance Form from [facility] to participate in [address] program; forwarded to provider/SLS 01/30

## 2018-10-15 NOTE — Addendum Note (Signed)
Addended by: Stark Bray on: 10/15/2018 08:38 AM   Modules accepted: Orders

## 2018-10-20 ENCOUNTER — Ambulatory Visit: Payer: Medicare Other

## 2018-10-21 ENCOUNTER — Ambulatory Visit: Payer: Medicare Other | Attending: Radiation Oncology

## 2018-10-21 DIAGNOSIS — I890 Lymphedema, not elsewhere classified: Secondary | ICD-10-CM | POA: Diagnosis not present

## 2018-10-21 DIAGNOSIS — R2689 Other abnormalities of gait and mobility: Secondary | ICD-10-CM | POA: Diagnosis not present

## 2018-10-21 DIAGNOSIS — C4492 Squamous cell carcinoma of skin, unspecified: Secondary | ICD-10-CM

## 2018-10-21 NOTE — Therapy (Addendum)
[place], [place], [address] Phone: [phone]   Fax:  [phone]  Physical Therapy Treatment  Patient Details  Name: Mary Buck MRN: [id number] Date of Birth: 1936/04/06 Referring Provider (PT): Shona Simpson. PA-C   Encounter Date: 10/21/2018  PT End of Session - 10/21/18 1035    Visit Number  7    Number of Visits  20    Date for PT Re-Evaluation  11/10/18    PT Start Time  0933    PT Stop Time  1018    PT Time Calculation (min)  45 min    Activity Tolerance  Patient tolerated treatment well    Behavior During Therapy  [facility] for tasks assessed/performed       Past Medical History:  Diagnosis Date  . Allergy   . Asthma   . Cancer ([place])   . Cataract   . DDD (degenerative disc disease), lumbar 06/01/2017  . Depression   . Heart murmur   . Hyperlipidemia   . Peripheral neuropathy 04/30/2017  . Thyroid disease     Past Surgical History:  Procedure Laterality Date  . bladder cancer  2013  . CARPAL TUNNEL RELEASE  2015  . CATARACT EXTRACTION Bilateral 2012  . CHOLECYSTECTOMY    . SALIVARY GLAND SURGERY Left 2015   benign  . TONSILLECTOMY AND ADENOIDECTOMY  1943    There were no vitals filed for this visit.  Subjective Assessment - 10/21/18 0942    Subjective  My Rt glut and leg is bothering me but I think I overdid it at [facility]. I think I'm going to like it.     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    Patient Stated Goals  be strong enough to go for a long walk and not be in pain after    Currently in Pain?  Yes    Pain Score  7     Pain Location  Leg    Pain Orientation  Right    Pain Descriptors / Indicators  Aching    Pain Type  Acute pain    Pain Onset  In the past 7 days    Pain Frequency  Constant    Aggravating Factors   think I overdid it at [facility]; FWB    Pain Relieving Factors  resting                  Outpatient Rehab from 08/04/2018 [facility]  Lymphedema Life Impact Scale Total Score  8.82 %           [facility] Adult PT Treatment/Exercise - 10/21/18 0001      Manual Therapy   Manual Lymphatic Drainage (MLD)  In Supine: Short neck, superficial and deep abdominals, Lt axillary nodes, Lt inguino-axillary anastomosis, and then Lt LE working from proximal thigh to mid lower leg (pt had bandage over still healing sore and was VERY tender to touch below that) then retracing all steps.                   PT Long Term Goals - 10/13/18 1446      PT LONG TERM GOAL #1   Title  Pt will return to ambulating without AD    Baseline  Pt only ambulates with SPC with community ambulation now-09/02/18; reports using cane 30% of time now-10/12/18    Status  Partially  Met      PT LONG TERM GOAL #2   Title  Pt will improve LLIS score to at least 18      PT LONG TERM GOAL #4   Title  Pt will obtain and be knowledgeable about appropriate compression garments     Baseline  Pt may not need these now as her swelling has drastically reduced as she continues to recover from radiation-09/02/18; for now Dr. Scot Dock wants pt to wear 8-15 mmHg as her skin is still healing from radiation-10/12/18            Plan - 10/21/18 1035    Clinical Impression Statement  Resumed manual lymph drainage to Lt LE today avoiding most distal part of leg due to tenderness. Though still with radiation fibrosis to lower leg, min skin stretch was noted where as no skin stretch at last visit. Pt reports undetermined if she wants to continue at this time as she has just started [employer] and will be focused on that for now. Billey Gosling also be out of town next week so will call us in about 2 weeks to determine D/C or cont.     Rehab Potential  Good    Clinical Impairments Affecting Rehab Potential  currently pain, still healing  sore at Lt lateral lower leg from radiation    PT Frequency  2x / week    PT Duration  4 weeks    PT Treatment/Interventions  ADLs/Self Care Home Management;Gait training;Therapeutic exercise;Patient/family education;Manual lymph drainage;Compression bandaging    PT Next Visit Plan  Pt to call back in about 2 weeks to determine if she wants to cont or D/C so she can focus on [employer] next few weeks to see if this will help her feel better. Did she get compression stockings?    Consulted and Agree with Plan of Care  Patient    Family Member Consulted  Partner, [name]       Patient will benefit from skilled therapeutic intervention in order to improve the following deficits and impairments:  Decreased skin integrity, Pain, Increased edema  Visit Diagnosis: Squamous cell skin cancer  Lymphedema, not elsewhere classified  Other abnormalities of gait and mobility     Problem List Patient Active Problem List   Diagnosis Date Noted  . Pedal edema 01/31/2018  . Cellulitis 01/31/2018  . Urinary hesitancy 01/31/2018  . Squamous cell carcinoma of skin of left lower limb, including hip 12/16/2017  . Plantar fasciitis 07/07/2017  . DDD (degenerative disc disease), lumbar 06/01/2017  . Fibromyalgia 06/01/2017  . Spinal stenosis at L4-L5 level 04/30/2017  . Peripheral neuropathy 04/30/2017  . Encounter for medication monitoring 03/05/2017  . Bladder cancer ([place]) 08/20/2015  . Cervical stenosis of spinal canal 07/27/2015  . Spinal stenosis of cervical region 07/27/2015  . Spondylolisthesis 07/27/2015  . Hypothyroid 07/05/2015  . Abdominal pain, acute, left upper quadrant 06/28/2015  . Basal cell carcinoma, face 06/28/2015  . Bilateral groin pain 06/28/2015  . Chronic pain 06/28/2015  . Fatigue 06/28/2015  . Hyperlipidemia 06/28/2015  . Pain in joints 06/28/2015  . Paresthesia of lower lip 06/28/2015  . Pulmonary nodule 06/28/2015  . Pustulosis palmaris et plantaris 06/28/2015  .  Ulcer of nose 06/28/2015  . 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  . Mass of parotid gland 03/24/2014    Otelia Limes, PTA 10/21/2018, 10:42 AM  [place], [place], [address] Phone: [phone]   Fax:  [phone]  Name: Mary Buck MRN: [id number] Date of Birth: [birth year]  PHYSICAL THERAPY DISCHARGE SUMMARY  Visits from Start of Care: 7  Current functional level related to goals / functional outcomes: Pt ending up not so much with lymphedema but more with radiation inflammation that has been improving with MLD and time.  Pt feeling okay with her current status.    Remaining deficits: Need for exercise   Education / Equipment: HEP Plan: Patient agrees to discharge.  Patient goals were partially met. Patient is being discharged due to being pleased with the current functional level.  ?????    Shan Levans, PT

## 2018-10-25 DIAGNOSIS — M5136 Other intervertebral disc degeneration, lumbar region: Secondary | ICD-10-CM | POA: Diagnosis not present

## 2018-10-25 DIAGNOSIS — M4316 Spondylolisthesis, lumbar region: Secondary | ICD-10-CM | POA: Diagnosis not present

## 2018-10-25 DIAGNOSIS — M797 Fibromyalgia: Secondary | ICD-10-CM | POA: Diagnosis not present

## 2018-10-25 DIAGNOSIS — M48062 Spinal stenosis, lumbar region with neurogenic claudication: Secondary | ICD-10-CM | POA: Diagnosis not present

## 2018-10-27 DIAGNOSIS — M25562 Pain in left knee: Secondary | ICD-10-CM | POA: Diagnosis not present

## 2018-10-28 ENCOUNTER — Ambulatory Visit (INDEPENDENT_AMBULATORY_CARE_PROVIDER_SITE_OTHER): Payer: Medicare Other | Admitting: Psychology

## 2018-10-28 DIAGNOSIS — F331 Major depressive disorder, recurrent, moderate: Secondary | ICD-10-CM | POA: Diagnosis not present

## 2018-11-03 ENCOUNTER — Encounter: Payer: Self-pay | Admitting: Family Medicine

## 2018-11-09 NOTE — Progress Notes (Addendum)
[place] at [facility] [place], [place], [place] [address] [phone]  Date:  11/11/2018   Name:  Mary Buck   DOB:  July 01, 1936   MRN:  [id number]  PCP:  Darreld Mclean, MD    Chief Complaint: Weight Loss (rapid weight loss, losing 1 pound per day, no coughing no URI symptoms) and Epistaxis (nose bleeds, "blood seems thin" Bruise on left leg)   History of Present Illness:  Mary Buck is a 83 y.o. 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  I last saw her in November, at which point she was having some swelling of her leg. We thought it was swollen due to a combination of venous insufficiency and radiation treatment for cancer. She is here today with concern of a few different things  She saw her pain management physician on February 10, for spinal stenosis and neurogenic claudication. She has been treated with hydrocodone and gabapentin  She notes that she has been losing weight for 7- 10 days; she will weigh herself daily and notes that her weight is down 1 pound per day Her partner thinks that she eats less than she used to, but pt herself has not really noticed this No vomiting  Wt Readings from Last 3 Encounters:  11/11/18 165 lb (74.8 kg)  10/13/18 171 lb (77.6 kg)  10/06/18 171 lb (77.6 kg)  August 2018, weight 172 She may have loose stools every other day She is trying to wean off hydrocodone - she cut down her dosage and is stretching out the time between doses.  This seems like a good way to taper off   She notes "little red spots" on all of her limbs since they were traveling and were in a motel last week. They are slightly irritated, but not really itchy Her partner Mary Buck is here today and contributes to the history.  Mary Buck does not  have any skin findings to suggest bedbugs or scabies. She also notes "a big bruise" on the lateral left calf which has been present for 1 to 2 weeks. I have not seen her leg since November, but patient and her partner feel it is much better overall.  She did have an [place] of her leg a month ago -no DVT Patient Active Problem List   Diagnosis Date Noted  . Pedal edema 01/31/2018  . Cellulitis 01/31/2018  . Urinary hesitancy 01/31/2018  . Squamous cell carcinoma of skin of left lower limb, including hip 12/16/2017  . Plantar fasciitis 07/07/2017  . DDD (degenerative disc disease), lumbar 06/01/2017  . Fibromyalgia 06/01/2017  . Spinal stenosis at L4-L5 level 04/30/2017  . Peripheral neuropathy 04/30/2017  . Encounter for medication monitoring 03/05/2017  . Bladder cancer ([place]) 08/20/2015  . Cervical stenosis of spinal canal 07/27/2015  . Spinal stenosis of cervical region 07/27/2015  . Spondylolisthesis 07/27/2015  . Hypothyroid 07/05/2015  . Abdominal pain, acute, left upper quadrant 06/28/2015  . Basal cell carcinoma, face 06/28/2015  . Bilateral groin pain 06/28/2015  . Chronic pain 06/28/2015  . Fatigue 06/28/2015  . Hyperlipidemia 06/28/2015  . Pain in joints 06/28/2015  . Paresthesia of lower lip 06/28/2015  . Pulmonary nodule 06/28/2015  . Pustulosis palmaris et plantaris 06/28/2015  . Ulcer of nose 06/28/2015  . 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  . Mass of  parotid gland 03/24/2014    Past Medical History:  Diagnosis Date  . Allergy   . Asthma   . Cancer ([place])   . Cataract   . DDD (degenerative disc disease), lumbar 06/01/2017  . Depression   . Heart murmur   . Hyperlipidemia   . Peripheral neuropathy 04/30/2017  . Thyroid disease     Past Surgical History:  Procedure Laterality Date  . bladder cancer  2013  . CARPAL TUNNEL RELEASE  2015  . CATARACT EXTRACTION Bilateral 2012  . CHOLECYSTECTOMY    . SALIVARY GLAND SURGERY Left 2015   benign  . TONSILLECTOMY AND  ADENOIDECTOMY  1943    Social History   Tobacco Use  . Smoking status: Former Smoker    Last attempt to quit: 2015    Years since quitting: 5.1  . Smokeless tobacco: Never Used  Substance Use Topics  . Alcohol use: No  . Drug use: No    Family History  Problem Relation Age of Onset  . Hyperlipidemia Father   . Stroke Father     Allergies  Allergen Reactions  . Cephalexin     Other reaction(s): unsure  . Contrast Media [Iodinated Diagnostic Agents]   . Latex     Other reaction(s): blistering, redness   . Mirtazapine     Other reaction(s): nausea  . Penicillins   . Sulfa Antibiotics   . Sulfur Other (See Comments)    Medication list has been reviewed and updated.  Current Outpatient Medications on File Prior to Visit  Medication Sig Dispense Refill  . Calcium Carbonate-Vitamin D (LIQUID CALCIUM/VITAMIN D PO) Take by mouth.    . gabapentin (NEURONTIN) 300 MG capsule Take 1 capsule (300 mg total) by mouth 3 (three) times daily. 90 capsule 4  . HYDROcodone-acetaminophen (NORCO) 5-325 MG tablet Take 1-2 tablets by mouth every 6 (six) hours as needed for moderate pain. 60 tablet 0  . lactobacillus acidophilus (BACID) TABS tablet Take 1 tablet by mouth daily.     . lansoprazole (PREVACID) 30 MG capsule TAKE ONE CAPSULE BY MOUTH TWICE DAILY BEFORE A MEAL. 180 capsule 3  . levothyroxine (SYNTHROID, LEVOTHROID) 88 MCG tablet TAKE 1 TABLET(88 MCG) BY MOUTH DAILY BEFORE BREAKFAST 90 tablet 1  . simvastatin (ZOCOR) 20 MG tablet TAKE 1 TABLET BY MOUTH DAILY 90 tablet 1   No current facility-administered medications on file prior to visit.     Review of Systems:  As per HPI- otherwise negative. No fever or chills  Physical Examination: Vitals:   11/11/18 1351  BP: 128/80  Pulse: 63  Resp: 16  Temp: 97.8 F (36.6 C)   Vitals:   11/11/18 1351  Weight: 165 lb (74.8 kg)  Height: 5' 5.5" (1.664 m)   Body mass index is 27.04 kg/m. Ideal Body Weight: Weight in (lb) to  have BMI = 25: 152.2  GEN: WDWN, NAD, Non-toxic, A & O x 3, overweight, looks well HEENT: Atraumatic, Normocephalic. Neck supple. No masses, No LAD.  Bilateral TM wnl, oropharynx normal.  PEERL,EOMI.   Ears and Nose: No external deformity. CV: RRR, No M/G/R. No JVD. No thrill. No extra heart sounds. PULM: CTA B, no wheezes, crackles, rhonchi. No retractions. No resp. distress. No accessory muscle use. ABD: S, NT, ND, +BS. No rebound. No HSM.  Abdomen is benign EXTR: No c/c/e NEURO Normal gait.  PSYCH: Normally interactive. Conversant. Not depressed or anxious appearing.  Calm demeanor.  Her left lower leg shows evidence of prior radiation  treatment.  The skin is still somewhat thin and inflamed, and the leg is slightly edematous.  At the lateral/anterior superior aspect of the calf there is a subtle raised area.  This is the patient's area of concern Her skin displays a few discrete scaly or dry appearing areas.  Assessment and Plan: Weight loss - Plan: CBC, Comprehensive metabolic panel, TSH  Left leg swelling - Plan: US Venous Img Lower Unilateral Left  Skin change  Here today with a few concerns She has noted weight loss, but only for about a week.  We will certainly get labs today. However I would advise that we should probably wait a bit longer before pursuing more aggressive evaluation.  We will have her continue to weigh herself, trying to eat more and see if her weight will stabilize. Will get an ultrasound of her left leg, to ensure no DVT Her skin findings are mild.  I advised her to first try an emollient such as Vaseline or Aquaphor.  If this is not helpful, can try over-the-counter hydrocortisone  Signed Lamar Blinks, MD  Received her ultrasound, message to patient US Venous Img Lower Unilateral Left  Result Date: 11/11/2018 CLINICAL DATA:  Left lower extremity swelling for 1 month EXAM: LEFT LOWER EXTREMITY VENOUS DUPLEX ULTRASOUND TECHNIQUE: Doppler venous  assessment of the left lower extremity deep venous system was performed, including characterization of spectral flow, compressibility, and phasicity. COMPARISON:  None. FINDINGS: There is complete compressibility of the left common femoral, femoral, and popliteal veins. Doppler analysis demonstrates respiratory phasicity and augmentation of flow with calf compression. No obvious superficial vein or calf vein thrombosis. 3.9 x 1.1 x 2.2 cm Baker's cyst in the popliteal fossa. IMPRESSION: No evidence of left lower extremity DVT. Small left popliteal fossa Baker's cyst. Electronically Signed   By: Marybelle Killings M.D.   On: 11/11/2018 16:24    2/28 Received her labs, message to patient  Results for orders placed or performed in visit on 11/11/18  CBC  Result Value Ref Range   WBC 6.1 4.0 - 10.5 K/uL   RBC 3.88 3.87 - 5.11 Mil/uL   Platelets 236.0 150.0 - 400.0 K/uL   Hemoglobin 11.6 (L) 12.0 - 15.0 g/dL   HCT 35.4 (L) 36.0 - 46.0 %   MCV 91.1 78.0 - 100.0 fl   MCHC 32.9 30.0 - 36.0 g/dL   RDW 15.1 11.5 - 15.5 %  Comprehensive metabolic panel  Result Value Ref Range   Sodium 142 135 - 145 mEq/L   Potassium 5.1 3.5 - 5.1 mEq/L   Chloride 105 96 - 112 mEq/L   CO2 30 19 - 32 mEq/L   Glucose, Bld 101 (H) 70 - 99 mg/dL   BUN 19 6 - 23 mg/dL   Creatinine, Ser 0.86 0.40 - 1.20 mg/dL   Total Bilirubin 0.4 0.2 - 1.2 mg/dL   Alkaline Phosphatase 78 39 - 117 U/L   AST 16 0 - 37 U/L   ALT 10 0 - 35 U/L   Total Protein 7.2 6.0 - 8.3 g/dL   Albumin 4.0 3.5 - 5.2 g/dL   Calcium 9.2 8.4 - 10.5 mg/dL   GFR 63.46 >60.00 mL/min  TSH  Result Value Ref Range   TSH 1.38 0.35 - 4.50 uIU/mL    Metabolic profile looks fine Thyroid is normal You do have just minimal anemia, this is stable from last year. I cannot see a record of when you last had a colonoscopy.  We will mail  you some stool cards, to check for blood.  You can complete these at home I want to make sure you are not anemic due to a slow  blood leak from the colon. Can you remember when you last had a colonoscopy? Please let me know.  I will watch for your stool cards, please keep me posted as to your symptoms. We can certainly refer you to gastroenterology if need be  Order stool cards for patient

## 2018-11-10 ENCOUNTER — Ambulatory Visit (INDEPENDENT_AMBULATORY_CARE_PROVIDER_SITE_OTHER): Payer: Medicare Other | Admitting: Psychology

## 2018-11-10 DIAGNOSIS — F331 Major depressive disorder, recurrent, moderate: Secondary | ICD-10-CM

## 2018-11-11 ENCOUNTER — Ambulatory Visit (HOSPITAL_BASED_OUTPATIENT_CLINIC_OR_DEPARTMENT_OTHER)
Admission: RE | Admit: 2018-11-11 | Discharge: 2018-11-11 | Disposition: A | Discharge status: Home / Self Care | Payer: Medicare Other | Source: Ambulatory Visit | Attending: Family Medicine | Admitting: Family Medicine

## 2018-11-11 ENCOUNTER — Encounter: Payer: Self-pay | Admitting: Family Medicine

## 2018-11-11 ENCOUNTER — Ambulatory Visit (INDEPENDENT_AMBULATORY_CARE_PROVIDER_SITE_OTHER): Payer: Medicare Other | Admitting: Family Medicine

## 2018-11-11 VITALS — BP 128/80 | HR 63 | Temp 97.8°F | Resp 16 | Ht 65.5 in | Wt 165.0 lb

## 2018-11-11 DIAGNOSIS — R634 Abnormal weight loss: Secondary | ICD-10-CM | POA: Diagnosis not present

## 2018-11-11 DIAGNOSIS — D649 Anemia, unspecified: Secondary | ICD-10-CM

## 2018-11-11 DIAGNOSIS — R239 Unspecified skin changes: Secondary | ICD-10-CM | POA: Diagnosis not present

## 2018-11-11 DIAGNOSIS — M7989 Other specified soft tissue disorders: Secondary | ICD-10-CM | POA: Insufficient documentation

## 2018-11-11 DIAGNOSIS — M7122 Synovial cyst of popliteal space [Baker], left knee: Secondary | ICD-10-CM | POA: Diagnosis not present

## 2018-11-11 NOTE — Patient Instructions (Addendum)
Please go to the imaging dept at 3:30 today for a left leg ultrasound For you skin, try vaseline or aquaphor first If this is not helpful, try an OTC hydrocortison   Please weight yourself every 1-2 days and write down numbers  If you continue to lose weight we will need to look further

## 2018-11-12 ENCOUNTER — Encounter: Payer: Self-pay | Admitting: Family Medicine

## 2018-11-12 LAB — CBC
HEMATOCRIT: 35.4 % — AB (ref 36.0–46.0)
HEMOGLOBIN: 11.6 g/dL — AB (ref 12.0–15.0)
MCHC: 32.9 g/dL (ref 30.0–36.0)
MCV: 91.1 fl (ref 78.0–100.0)
Platelets: 236 10*3/uL (ref 150.0–400.0)
RBC: 3.88 Mil/uL (ref 3.87–5.11)
RDW: 15.1 % (ref 11.5–15.5)
WBC: 6.1 10*3/uL (ref 4.0–10.5)

## 2018-11-12 LAB — COMPREHENSIVE METABOLIC PANEL
ALT: 10 U/L (ref 0–35)
AST: 16 U/L (ref 0–37)
Albumin: 4 g/dL (ref 3.5–5.2)
Alkaline Phosphatase: 78 U/L (ref 39–117)
BILIRUBIN TOTAL: 0.4 mg/dL (ref 0.2–1.2)
BUN: 19 mg/dL (ref 6–23)
CO2: 30 mEq/L (ref 19–32)
Calcium: 9.2 mg/dL (ref 8.4–10.5)
Chloride: 105 mEq/L (ref 96–112)
Creatinine, Ser: 0.86 mg/dL (ref 0.40–1.20)
GFR: 63.46 mL/min (ref >60.00)
Glucose, Bld: 101 mg/dL — ABNORMAL HIGH (ref 70–99)
Potassium: 5.1 mEq/L (ref 3.5–5.1)
Sodium: 142 mEq/L (ref 135–145)
Total Protein: 7.2 g/dL (ref 6.0–8.3)

## 2018-11-12 LAB — TSH: TSH: 1.38 u[IU]/mL (ref 0.35–4.50)

## 2018-11-12 NOTE — Addendum Note (Signed)
Addended by: Lamar Blinks C on: 11/12/2018 02:03 PM   Modules accepted: Orders

## 2018-11-15 ENCOUNTER — Other Ambulatory Visit: Payer: Self-pay | Admitting: Family Medicine

## 2018-11-16 DIAGNOSIS — Z85528 Personal history of other malignant neoplasm of kidney: Secondary | ICD-10-CM | POA: Diagnosis not present

## 2018-11-16 DIAGNOSIS — C679 Malignant neoplasm of bladder, unspecified: Secondary | ICD-10-CM | POA: Diagnosis not present

## 2018-11-20 ENCOUNTER — Other Ambulatory Visit: Payer: Self-pay | Admitting: Family Medicine

## 2018-11-22 ENCOUNTER — Ambulatory Visit (INDEPENDENT_AMBULATORY_CARE_PROVIDER_SITE_OTHER): Payer: Medicare Other | Admitting: Psychology

## 2018-11-22 DIAGNOSIS — F331 Major depressive disorder, recurrent, moderate: Secondary | ICD-10-CM

## 2018-11-24 DIAGNOSIS — M48062 Spinal stenosis, lumbar region with neurogenic claudication: Secondary | ICD-10-CM | POA: Diagnosis not present

## 2018-11-25 DIAGNOSIS — C44729 Squamous cell carcinoma of skin of left lower limb, including hip: Secondary | ICD-10-CM | POA: Diagnosis not present

## 2018-11-25 DIAGNOSIS — Z85828 Personal history of other malignant neoplasm of skin: Secondary | ICD-10-CM | POA: Diagnosis not present

## 2018-12-01 ENCOUNTER — Other Ambulatory Visit (INDEPENDENT_AMBULATORY_CARE_PROVIDER_SITE_OTHER): Payer: Medicare Other

## 2018-12-01 DIAGNOSIS — D649 Anemia, unspecified: Secondary | ICD-10-CM

## 2018-12-01 LAB — FECAL OCCULT BLOOD, IMMUNOCHEMICAL: Fecal Occult Bld: NEGATIVE

## 2018-12-02 ENCOUNTER — Encounter: Payer: Self-pay | Admitting: Family Medicine

## 2018-12-02 DIAGNOSIS — M48062 Spinal stenosis, lumbar region with neurogenic claudication: Secondary | ICD-10-CM | POA: Diagnosis not present

## 2018-12-02 DIAGNOSIS — M4316 Spondylolisthesis, lumbar region: Secondary | ICD-10-CM | POA: Diagnosis not present

## 2018-12-02 DIAGNOSIS — M5136 Other intervertebral disc degeneration, lumbar region: Secondary | ICD-10-CM | POA: Diagnosis not present

## 2018-12-08 ENCOUNTER — Ambulatory Visit (INDEPENDENT_AMBULATORY_CARE_PROVIDER_SITE_OTHER): Payer: Medicare Other | Admitting: Psychology

## 2018-12-08 DIAGNOSIS — F331 Major depressive disorder, recurrent, moderate: Secondary | ICD-10-CM

## 2018-12-09 ENCOUNTER — Other Ambulatory Visit: Payer: Self-pay

## 2018-12-09 ENCOUNTER — Telehealth: Payer: Self-pay

## 2018-12-09 ENCOUNTER — Ambulatory Visit (INDEPENDENT_AMBULATORY_CARE_PROVIDER_SITE_OTHER): Payer: Medicare Other | Admitting: Family Medicine

## 2018-12-09 DIAGNOSIS — T50905A Adverse effect of unspecified drugs, medicaments and biological substances, initial encounter: Secondary | ICD-10-CM | POA: Diagnosis not present

## 2018-12-09 NOTE — Telephone Encounter (Signed)
Please make a phone visit for this pt

## 2018-12-09 NOTE — Telephone Encounter (Signed)
Appt scheduled for 3pm

## 2018-12-09 NOTE — Telephone Encounter (Signed)
Copied from [place] [id number]. Topic: General - Other >> Dec 09, 2018  1:48 PM Leward Quan A wrote: Reason for CRM: Patient called to request a call back from doctor Copland, stated that she is having issues with several medication gabapentin (NEURONTIN) 300 MG capsule, traMADol (ULTRAM) and Acitretin per patient the medications are causing her to peel and also making he very dizzy. I offered to have her speak to a nurse about dizziness she declined saying that they will just want her to go to PT and she only want to talk with Dr Lorelei Pont or her assistant. Please call patient at Ph# [phone]

## 2018-12-09 NOTE — Telephone Encounter (Signed)
Patient scheduled for appointment.

## 2018-12-09 NOTE — Progress Notes (Signed)
[place] at [facility] [address] [place], [place] [address] [phone]  Date:  12/09/2018   Name:  Mary Buck   DOB:  [birth year]   MRN:  [id number]  PCP:  Darreld Mclean, MD    Chief Complaint: No chief complaint on file.   History of Present Illness:  Mary Buck is a 83 y.o. very pleasant female patient who presents with the following: Called pt for a phone visit due to COVID 19 Pt confined her DOB and name  No fever or cough   She is also on tramadol and gabapentin for pain but does not feel like she has that much pain right now- ?could she decrease her meds She also has felt dizzy- wonders if the tramadol could be contributing She has been on gabapentin for "ages"- taking 300 TID She is taking tramadol TID as well   Wt Readings from Last 3 Encounters:  11/11/18 165 lb (74.8 kg)  10/13/18 171 lb (77.6 kg)  10/06/18 171 lb (77.6 kg)   She was started on acitretin 8-9 days ago, and noted that her skin started to peel a few days after starting on acitretin.   She does not have any mouth or genital lesions/ blisters  Is not acutely ill  Told her to stop this med and contact her derm for further instructions     Patient Active Problem List   Diagnosis Date Noted  . Pedal edema 01/31/2018  . Cellulitis 01/31/2018  . Urinary hesitancy 01/31/2018  . Squamous cell carcinoma of skin of left lower limb, including hip 12/16/2017  . Plantar fasciitis 07/07/2017  . DDD (degenerative disc disease), lumbar 06/01/2017  . Fibromyalgia 06/01/2017  . Spinal stenosis at L4-L5 level 04/30/2017  . Peripheral neuropathy 04/30/2017  . Encounter for medication monitoring 03/05/2017  . Bladder cancer ([place]) 08/20/2015  . Cervical stenosis of spinal canal 07/27/2015  . Spinal stenosis of cervical region 07/27/2015  . Spondylolisthesis 07/27/2015  . Hypothyroid 07/05/2015  . Abdominal pain, acute, left upper quadrant  06/28/2015  . Basal cell carcinoma, face 06/28/2015  . Bilateral groin pain 06/28/2015  . Chronic pain 06/28/2015  . Fatigue 06/28/2015  . Hyperlipidemia 06/28/2015  . Pain in joints 06/28/2015  . Paresthesia of lower lip 06/28/2015  . Pulmonary nodule 06/28/2015  . Pustulosis palmaris et plantaris 06/28/2015  . Ulcer of nose 06/28/2015  . 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  . Mass of parotid gland 03/24/2014    Past Medical History:  Diagnosis Date  . Allergy   . Asthma   . Cancer ([place])   . Cataract   . DDD (degenerative disc disease), lumbar 06/01/2017  . Depression   . Heart murmur   . Hyperlipidemia   . Peripheral neuropathy 04/30/2017  . Thyroid disease     Past Surgical History:  Procedure Laterality Date  . bladder cancer  2013  . CARPAL TUNNEL RELEASE  2015  . CATARACT EXTRACTION Bilateral 2012  . CHOLECYSTECTOMY    . SALIVARY GLAND SURGERY Left 2015   benign  . TONSILLECTOMY AND ADENOIDECTOMY  1943    Social History   Tobacco Use  . Smoking status: Former Smoker    Last attempt to quit: 2015    Years since quitting: 5.2  . Smokeless tobacco: Never Used  Substance Use Topics  . Alcohol use: No  . Drug use: No    Family History  Problem Relation Age of Onset  . Hyperlipidemia  Father   . Stroke Father     Allergies  Allergen Reactions  . Cephalexin     Other reaction(s): unsure  . Contrast Media [Iodinated Diagnostic Agents]   . Latex     Other reaction(s): blistering, redness   . Mirtazapine     Other reaction(s): nausea  . Penicillins   . Sulfa Antibiotics   . Sulfur Other (See Comments)    Medication list has been reviewed and updated.  Current Outpatient Medications on File Prior to Visit  Medication Sig Dispense Refill  . Calcium Carbonate-Vitamin D (LIQUID CALCIUM/VITAMIN D PO) Take by mouth.    . gabapentin (NEURONTIN) 300 MG capsule Take 1 capsule (300 mg total) by mouth 3 (three) times daily. 90 capsule 4  . HYDROcodone-acetaminophen  (NORCO) 5-325 MG tablet Take 1-2 tablets by mouth every 6 (six) hours as needed for moderate pain. 60 tablet 0  . lactobacillus acidophilus (BACID) TABS tablet Take 1 tablet by mouth daily.     . lansoprazole (PREVACID) 30 MG capsule TAKE ONE CAPSULE BY MOUTH TWICE DAILY BEFORE A MEAL. 180 capsule 3  . levothyroxine (SYNTHROID, LEVOTHROID) 88 MCG tablet TAKE 1 TABLET(88 MCG) BY MOUTH DAILY BEFORE BREAKFAST 90 tablet 1  . simvastatin (ZOCOR) 20 MG tablet TAKE 1 TABLET BY MOUTH DAILY 90 tablet 1   No current facility-administered medications on file prior to visit.     Review of Systems:  As per HPI- otherwise negative.   Physical Examination: There were no vitals filed for this visit. There were no vitals filed for this visit. There is no height or weight on file to calculate BMI. Ideal Body Weight:    Pt sounds well, no apparent SOB or wheezing, no cough   Assessment and Plan: Adverse effect of drug, initial encounter   Pt has concern of feeling dizzy, would like to stop tramadol as she does not have any pain.  This is certainly fine. She is taking 50 mg TID- instructed her to take BID for 3 days, then once a day for 3 days and then stop  We can then taper off her gabapentin as well if she likes- she will keep me posted   Called [facility] derm- spoke with nurse for Dr. Wilhemina Bonito and let her know what I told pt, they will contact her with further instructions   Spoke with pt for under 10 minutes   Signed Lamar Blinks, MD

## 2018-12-21 ENCOUNTER — Ambulatory Visit (INDEPENDENT_AMBULATORY_CARE_PROVIDER_SITE_OTHER): Payer: Medicare Other | Admitting: Psychology

## 2018-12-21 DIAGNOSIS — F331 Major depressive disorder, recurrent, moderate: Secondary | ICD-10-CM

## 2018-12-22 DIAGNOSIS — Z79899 Other long term (current) drug therapy: Secondary | ICD-10-CM | POA: Diagnosis not present

## 2018-12-22 DIAGNOSIS — L853 Xerosis cutis: Secondary | ICD-10-CM | POA: Diagnosis not present

## 2018-12-22 DIAGNOSIS — Z85828 Personal history of other malignant neoplasm of skin: Secondary | ICD-10-CM | POA: Diagnosis not present

## 2018-12-22 DIAGNOSIS — C44729 Squamous cell carcinoma of skin of left lower limb, including hip: Secondary | ICD-10-CM | POA: Diagnosis not present

## 2018-12-27 ENCOUNTER — Encounter: Payer: Self-pay | Admitting: Family Medicine

## 2018-12-27 ENCOUNTER — Ambulatory Visit (INDEPENDENT_AMBULATORY_CARE_PROVIDER_SITE_OTHER): Payer: Medicare Other | Admitting: Family Medicine

## 2018-12-27 ENCOUNTER — Other Ambulatory Visit: Payer: Self-pay

## 2018-12-27 VITALS — BP 118/80 | HR 70 | Temp 98.0°F | Resp 16 | Ht 65.0 in | Wt 162.0 lb

## 2018-12-27 DIAGNOSIS — H00015 Hordeolum externum left lower eyelid: Secondary | ICD-10-CM

## 2018-12-27 DIAGNOSIS — R079 Chest pain, unspecified: Secondary | ICD-10-CM

## 2018-12-27 LAB — TROPONIN I: Troponin I: <0.01 ng/mL (ref <=0.0)

## 2018-12-27 NOTE — Progress Notes (Addendum)
[place] at [facility] [place], [place], [place] [address] [phone]  Date:  12/27/2018   Name:  Mary Buck   DOB:  09/11/1936   MRN:  [id number]  PCP:  Darreld Mclean, MD    Chief Complaint: Chest Pain (about one month, since starting cancer treatment)   History of Present Illness:  Mary Buck is a 83 y.o. very pleasant female patient who presents with the following:  Virtual visit today - due to [place] outbreak  However I then had pt come in due to complaint of chest pain  Pt is at home Provider is at office Pt ID confirmed with name and DOB  She notes a likely stye of her left lower lid It has been that way since yesterday The eyeball itself is ok Vision is ok She does not wear contacts  She has not had an injury to her eye as far as she knows  She recently started on a new oral retinoid/chemo drug- acitretin- per dermatology- about one month ago Since starting this med she has noted chest pain- which she describes as a non exertional pain that occurs when she moves/ stretches her trunk. She has not noted if it hurts to press on her chest wall Pain has been present for the last month, not getting worse.  May be getting better   She does not have any history of CAD or MI   She went for a long walk yesterday and did fine No SOB Patient Active Problem List   Diagnosis Date Noted  . Pedal edema 01/31/2018  . Cellulitis 01/31/2018  . Urinary hesitancy 01/31/2018  . Squamous cell carcinoma of skin of left lower limb, including hip 12/16/2017  . Plantar fasciitis 07/07/2017  . DDD (degenerative disc disease), lumbar 06/01/2017  . Fibromyalgia 06/01/2017  . Spinal stenosis at L4-L5 level 04/30/2017  . Peripheral neuropathy 04/30/2017  . Encounter for medication monitoring 03/05/2017  . Bladder cancer ([place]) 08/20/2015  . Cervical stenosis of spinal canal 07/27/2015  . Spinal stenosis of cervical  region 07/27/2015  . Spondylolisthesis 07/27/2015  . Hypothyroid 07/05/2015  . Abdominal pain, acute, left upper quadrant 06/28/2015  . Basal cell carcinoma, face 06/28/2015  . Bilateral groin pain 06/28/2015  . Chronic pain 06/28/2015  . Fatigue 06/28/2015  . Hyperlipidemia 06/28/2015  . Pain in joints 06/28/2015  . Paresthesia of lower lip 06/28/2015  . Pulmonary nodule 06/28/2015  . Pustulosis palmaris et plantaris 06/28/2015  . Ulcer of nose 06/28/2015  . 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  . Mass of parotid gland 03/24/2014    Past Medical History:  Diagnosis Date  . Allergy   . Asthma   . Cancer ([place])   . Cataract   . DDD (degenerative disc disease), lumbar 06/01/2017  . Depression   . Heart murmur   . Hyperlipidemia   . Peripheral neuropathy 04/30/2017  . Thyroid disease     Past Surgical History:  Procedure Laterality Date  . bladder cancer  2013  . CARPAL TUNNEL RELEASE  2015  . CATARACT EXTRACTION Bilateral 2012  . CHOLECYSTECTOMY    . SALIVARY GLAND SURGERY Left 2015   benign  . TONSILLECTOMY AND ADENOIDECTOMY  1943    Social History   Tobacco Use  . Smoking status: Former Smoker    Last attempt to quit: 2015    Years since quitting: 5.2  . Smokeless tobacco: Never Used  Substance Use Topics  .  Alcohol use: No  . Drug use: No    Family History  Problem Relation Age of Onset  . Hyperlipidemia Father   . Stroke Father     Allergies  Allergen Reactions  . Cephalexin     Other reaction(s): unsure  . Contrast Media [Iodinated Diagnostic Agents]   . Latex     Other reaction(s): blistering, redness   . Mirtazapine     Other reaction(s): nausea  . Penicillins   . Sulfa Antibiotics   . Sulfur Other (See Comments)    Medication list has been reviewed and updated.  Current Outpatient Medications on File Prior to Visit  Medication Sig Dispense Refill  . acitretin (SORIATANE) 10 MG capsule Take 10 mg by mouth daily.     Marland Kitchen gabapentin (NEURONTIN) 300 MG  capsule Take 1 capsule (300 mg total) by mouth 3 (three) times daily. 90 capsule 4  . lactobacillus acidophilus (BACID) TABS tablet Take 1 tablet by mouth daily.     . lansoprazole (PREVACID) 30 MG capsule TAKE ONE CAPSULE BY MOUTH TWICE DAILY BEFORE A MEAL. 180 capsule 3  . levothyroxine (SYNTHROID, LEVOTHROID) 88 MCG tablet TAKE 1 TABLET(88 MCG) BY MOUTH DAILY BEFORE BREAKFAST 90 tablet 1  . simvastatin (ZOCOR) 20 MG tablet TAKE 1 TABLET BY MOUTH DAILY 90 tablet 1   No current facility-administered medications on file prior to visit.     Review of Systems:  As per HPI- otherwise negative. No fever or chills No new cough- she does have GERD and will cough on occasion, has done so for years   Physical Examination: Vitals:   12/27/18 1336  BP: 118/80  Pulse: 70  Resp: 16  Temp: 98 F (36.7 C)  SpO2: 99%   Vitals:   12/27/18 1336  Weight: 162 lb (73.5 kg)  Height: 5\' 5"  (1.651 m)   Body mass index is 26.96 kg/m. Ideal Body Weight: Weight in (lb) to have BMI = 25: 149.9  GEN: WDWN, NAD, Non-toxic, A & O x 3, mild overweight, looks well  HEENT: Atraumatic, Normocephalic. Neck supple. No masses, No LAD. Ears and Nose: No external deformity. CV: RRR, No M/G/R. No JVD. No thrill. No extra heart sounds. I am easily able to reproduce her chest pain by pressing anywhere on her chest wall.  No rash is visible  PULM: CTA B, no wheezes, crackles, rhonchi. No retractions. No resp. distress. No accessory muscle use. ABD: S, NT, ND. No rebound. No HSM. EXTR: No c/c/e NEURO Normal gait.  PSYCH: Normally interactive. Conversant. Not depressed or anxious appearing.  Calm demeanor.  Stye on left lower lid - appears to be ready to drain any time, pus head is visible on inner lid  EKG:SR with occasional PAC.  Left axis but no ST change. Overall reassuring but no old EKG for comparison  Assessment and Plan: Chest pain, unspecified type - Plan: EKG 12-Lead, Troponin I -, CANCELED: Troponin I  -  Hordeolum externum of left lower eyelid  Stye- instructed pt to apply warm compresses until drained.  Should be soon.  She will let me know if not successful in the next couple of dys  Chest pain- she has obvious reproducible tenderness  EKG today is reassuring although not "normal."  Will check troponin for her as well today Assuming this is negative suspect MSK chest pain   Signed Lamar Blinks, MD

## 2018-12-27 NOTE — Patient Instructions (Addendum)
It was good to see you today- it looks like your stye will drain soon!  Use a hot compress frequently as we discussed.  Let me know if not drained in the next couple of days   Your EKG is overall reassuring.  I did a troponin blood test for you as well- a negative result here is also reassuring.  Assuming troponin is negative I think this is muscular chest pain.  However do let me know if getting worse or changing

## 2018-12-29 ENCOUNTER — Telehealth: Payer: Self-pay | Admitting: *Deleted

## 2018-12-29 NOTE — Telephone Encounter (Signed)
Received Lab Report results from [facility]; forwarded to provider/SLS 04/15

## 2019-01-06 DIAGNOSIS — M48062 Spinal stenosis, lumbar region with neurogenic claudication: Secondary | ICD-10-CM | POA: Diagnosis not present

## 2019-01-06 DIAGNOSIS — M5136 Other intervertebral disc degeneration, lumbar region: Secondary | ICD-10-CM | POA: Diagnosis not present

## 2019-01-06 DIAGNOSIS — G894 Chronic pain syndrome: Secondary | ICD-10-CM | POA: Diagnosis not present

## 2019-01-06 DIAGNOSIS — M797 Fibromyalgia: Secondary | ICD-10-CM | POA: Diagnosis not present

## 2019-01-07 ENCOUNTER — Ambulatory Visit (INDEPENDENT_AMBULATORY_CARE_PROVIDER_SITE_OTHER): Payer: Medicare Other | Admitting: Psychology

## 2019-01-07 DIAGNOSIS — F331 Major depressive disorder, recurrent, moderate: Secondary | ICD-10-CM | POA: Diagnosis not present

## 2019-01-12 DIAGNOSIS — L309 Dermatitis, unspecified: Secondary | ICD-10-CM | POA: Diagnosis not present

## 2019-01-12 DIAGNOSIS — Z85828 Personal history of other malignant neoplasm of skin: Secondary | ICD-10-CM | POA: Diagnosis not present

## 2019-01-26 ENCOUNTER — Ambulatory Visit (INDEPENDENT_AMBULATORY_CARE_PROVIDER_SITE_OTHER): Payer: Medicare Other | Admitting: Psychology

## 2019-01-26 DIAGNOSIS — F331 Major depressive disorder, recurrent, moderate: Secondary | ICD-10-CM | POA: Diagnosis not present

## 2019-01-31 DIAGNOSIS — L905 Scar conditions and fibrosis of skin: Secondary | ICD-10-CM | POA: Diagnosis not present

## 2019-01-31 DIAGNOSIS — D485 Neoplasm of uncertain behavior of skin: Secondary | ICD-10-CM | POA: Diagnosis not present

## 2019-01-31 DIAGNOSIS — Z85828 Personal history of other malignant neoplasm of skin: Secondary | ICD-10-CM | POA: Diagnosis not present

## 2019-01-31 DIAGNOSIS — C44722 Squamous cell carcinoma of skin of right lower limb, including hip: Secondary | ICD-10-CM | POA: Diagnosis not present

## 2019-01-31 DIAGNOSIS — L570 Actinic keratosis: Secondary | ICD-10-CM | POA: Diagnosis not present

## 2019-02-09 ENCOUNTER — Ambulatory Visit (INDEPENDENT_AMBULATORY_CARE_PROVIDER_SITE_OTHER): Payer: Medicare Other | Admitting: Psychology

## 2019-02-09 DIAGNOSIS — F331 Major depressive disorder, recurrent, moderate: Secondary | ICD-10-CM | POA: Diagnosis not present

## 2019-02-17 ENCOUNTER — Encounter: Payer: Self-pay | Admitting: Family Medicine

## 2019-02-17 ENCOUNTER — Other Ambulatory Visit: Payer: Self-pay

## 2019-02-17 ENCOUNTER — Ambulatory Visit (INDEPENDENT_AMBULATORY_CARE_PROVIDER_SITE_OTHER): Payer: Medicare Other | Admitting: Family Medicine

## 2019-02-17 VITALS — BP 138/80 | HR 60 | Temp 98.3°F | Ht 65.0 in | Wt 165.0 lb

## 2019-02-17 DIAGNOSIS — R239 Unspecified skin changes: Secondary | ICD-10-CM | POA: Diagnosis not present

## 2019-02-17 DIAGNOSIS — R269 Unspecified abnormalities of gait and mobility: Secondary | ICD-10-CM

## 2019-02-17 DIAGNOSIS — G6289 Other specified polyneuropathies: Secondary | ICD-10-CM

## 2019-02-17 NOTE — Progress Notes (Signed)
[facility] [place], [place], [place] [address] [phone]  Date:  02/17/2019   Name:  Mary Buck   DOB:  19-Mar-1936   MRN:  [id number]  PCP:  Darreld Mclean, MD    Chief Complaint: Fatigue (falling asleep often, off balance, feels "dopey", dropping things, memory loss, possible parkinsons)   History of Present Illness:  Mary Buck is a 83 y.o. 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  However she does have spinal stenosis, which causes neurogenic claudication. She will sometimes drop things, they have noted memory loss, she has complaint of feeling sleepy/ drowsy.  She has however taking tramadol and also gabapentin No tremor of her hands No syncope No headaches   She wonders if her current symptoms could be due to acitretin for squamous cell cancer treatment.  That is certainly possible, however I don't think this is very likely  She has cut down on her tramadol in an attempt to minimize side effects- she is seen by pain management for her spinal stenosis   She has not otherwise been ill, no cough or fever  Patient Active Problem List   Diagnosis Date Noted  . Pedal edema 01/31/2018  . Cellulitis 01/31/2018  . Urinary hesitancy 01/31/2018  . Squamous cell carcinoma of skin of left lower limb, including hip 12/16/2017  . Plantar fasciitis 07/07/2017  . DDD (degenerative disc disease), lumbar 06/01/2017  . Fibromyalgia 06/01/2017  . Spinal stenosis at L4-L5 level 04/30/2017  . Peripheral neuropathy 04/30/2017  . Encounter for  medication monitoring 03/05/2017  . Bladder cancer ([place]) 08/20/2015  . Cervical stenosis of spinal canal 07/27/2015  . Spinal stenosis of cervical region 07/27/2015  . Spondylolisthesis 07/27/2015  . Hypothyroid 07/05/2015  . Abdominal pain, acute, left upper quadrant 06/28/2015  . Basal cell carcinoma, face 06/28/2015  . Bilateral groin pain 06/28/2015  . Chronic pain 06/28/2015  . Fatigue 06/28/2015  . Hyperlipidemia 06/28/2015  . Pain in joints 06/28/2015  . Paresthesia of lower lip 06/28/2015  . Pulmonary nodule 06/28/2015  . Pustulosis palmaris et plantaris 06/28/2015  . Ulcer of nose 06/28/2015  . 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  . Mass of parotid gland 03/24/2014    Past Medical History:  Diagnosis Date  . Allergy   . Asthma   . Cancer ([place])   . Cataract   . DDD (degenerative disc disease), lumbar 06/01/2017  . Depression   . Heart murmur   . Hyperlipidemia   . Peripheral neuropathy 04/30/2017  . Thyroid disease     Past Surgical History:  Procedure Laterality Date  . bladder cancer  2013  . CARPAL TUNNEL RELEASE  2015  . CATARACT EXTRACTION Bilateral 2012  . CHOLECYSTECTOMY    . SALIVARY GLAND SURGERY Left 2015   benign  . TONSILLECTOMY AND ADENOIDECTOMY  1943    Social History   Tobacco Use  . Smoking status: Former Smoker    Last attempt to quit: 2015    Years since quitting: 5.4  . Smokeless tobacco: Never Used  Substance Use Topics  . Alcohol use:  No  . Drug use: No    Family History  Problem Relation Age of Onset  . Hyperlipidemia Father   . Stroke Father     Allergies  Allergen Reactions  . Cephalexin     Other reaction(s): unsure  . Contrast Media [Iodinated Diagnostic Agents]   . Latex     Other reaction(s): blistering, redness   . Mirtazapine     Other reaction(s): nausea  . Penicillins   . Sulfa Antibiotics   . Sulfur Other (See Comments)    Medication list has been reviewed and updated.  Current Outpatient Medications on File  Prior to Visit  Medication Sig Dispense Refill  . acitretin (SORIATANE) 10 MG capsule Take 10 mg by mouth daily.     Marland Kitchen gabapentin (NEURONTIN) 300 MG capsule Take 1 capsule (300 mg total) by mouth 3 (three) times daily. 90 capsule 4  . lactobacillus acidophilus (BACID) TABS tablet Take 1 tablet by mouth daily.     . lansoprazole (PREVACID) 30 MG capsule TAKE ONE CAPSULE BY MOUTH TWICE DAILY BEFORE A MEAL. 180 capsule 3  . levothyroxine (SYNTHROID, LEVOTHROID) 88 MCG tablet TAKE 1 TABLET(88 MCG) BY MOUTH DAILY BEFORE BREAKFAST 90 tablet 1  . misoprostol (CYTOTEC) 100 MCG tablet     . simvastatin (ZOCOR) 20 MG tablet TAKE 1 TABLET BY MOUTH DAILY 90 tablet 1   No current facility-administered medications on file prior to visit.     Review of Systems:  As per HPI- otherwise negative. No fever or chills  Physical Examination: Vitals:   02/17/19 1528 02/17/19 1630  BP: 138/80   Pulse: (!) 55 60  Temp: 98.3 F (36.8 C)   SpO2: 98%    Vitals:   02/17/19 1528  Weight: 165 lb (74.8 kg)  Height: 5\' 5"  (1.651 m)   Body mass index is 27.46 kg/m. Ideal Body Weight: Weight in (lb) to have BMI = 25: 149.9  GEN: WDWN, NAD, Non-toxic, A & O x 3, looks well, normal weight HEENT: Atraumatic, Normocephalic. Neck supple. No masses, No LAD. Ears and Nose: No external deformity. CV: RRR, No M/G/R. No JVD. No thrill. No extra heart sounds. PULM: CTA B, no wheezes, crackles, rhonchi. No retractions. No resp. distress. No accessory muscle use. EXTR: No c/c/e.  Skin cancer site on her left lower leg looks much better, some erythema still remains some radiation but it is much improved NEURO Normal gait.  PSYCH: Normally interactive. Conversant. Not depressed or anxious appearing.  Calm demeanor.  Upper and lower extremity strength seems normal today, normal deep tendon reflex.  Negative Romberg.  She does have some difficulty with tandem stance, but not necessarily out of ordinary for her age.  Pulse  Readings from Last 3 Encounters:  02/17/19 60  12/27/18 70  11/11/18 63     Assessment and Plan: Gait difficulty - Plan: Ambulatory referral to Neurology, CANCELED: Ambulatory referral to Neurology  Other polyneuropathy  Skin change  Here today with concern of change in her gait, and some other symptoms such as sleepiness, dropping items.  The patient is concerned that Parkinson's disease.  I will refer her to neurology for consultation.  Her pain is under reasonable control on current regimen per pain management. Her skin looks much better following her treatment for squamous cell carcinoma of the left ankle  Signed Lamar Blinks, MD

## 2019-02-17 NOTE — Patient Instructions (Signed)
It was nice to see you today!   We will get you set up for a neurology appt to discuss your concerns about balance and gait  You might find that leaning on something like a rolling walker or a cart will allow you to walk further without as much back pain

## 2019-02-22 ENCOUNTER — Other Ambulatory Visit: Payer: Self-pay | Admitting: Family Medicine

## 2019-02-22 DIAGNOSIS — L409 Psoriasis, unspecified: Secondary | ICD-10-CM

## 2019-03-14 ENCOUNTER — Telehealth: Payer: Self-pay | Admitting: Neurology

## 2019-03-14 NOTE — Telephone Encounter (Signed)
Due to current COVID 19 pandemic, our office is severely reducing in office visits until further notice, in order to minimize the risk to our patients and healthcare providers.   I called patient and LVM regarding 7/2 appt. Advised in VM that patient can come in office unless she would prefer a virtual visit. Requested patient call back if she needs a virtual visit or to reschedule. Office contact info provided.

## 2019-03-17 ENCOUNTER — Ambulatory Visit (INDEPENDENT_AMBULATORY_CARE_PROVIDER_SITE_OTHER): Payer: Medicare Other | Admitting: Neurology

## 2019-03-17 ENCOUNTER — Encounter: Payer: Self-pay | Admitting: Neurology

## 2019-03-17 ENCOUNTER — Other Ambulatory Visit: Payer: Self-pay

## 2019-03-17 VITALS — BP 122/78 | HR 64 | Temp 96.2°F | Ht 66.0 in | Wt 163.0 lb

## 2019-03-17 DIAGNOSIS — R269 Unspecified abnormalities of gait and mobility: Secondary | ICD-10-CM

## 2019-03-17 DIAGNOSIS — G4719 Other hypersomnia: Secondary | ICD-10-CM

## 2019-03-17 DIAGNOSIS — R2689 Other abnormalities of gait and mobility: Secondary | ICD-10-CM | POA: Diagnosis not present

## 2019-03-17 NOTE — Progress Notes (Signed)
Subjective:    Patient ID: Mary Buck is a 83 y.o. female.  HPI     Star Age, MD, PhD [facility] [address] [place], [place] [address]  Dear Dr. Edilia Bo,   I saw your patient, Mary Buck, upon your kind request in my neurologic clinic today for initial consultation of her gait and balance problem.  The patient is accompanied by her partner today.  As you know, Mary Buck is an 83 year old right-handed woman with an underlying complex medical history of lumbar spinal stenosis, history of bladder cancer, cervical spinal stenosis and degenerative spine disease, hypothyroidism, obesity, fibromyalgia, history of neuropathy, history of squamous cell cancer with status post radiation treatment to the left leg as well as treatment with acitretin until recently, who reports worsening balance problems for the past few months.  Thankfully she has not fallen.  She fell in December 2018.  She reports forgetfulness and daytime sleepiness.  She takes tramadol 50 mg 3 times daily and also takes gabapentin, 300 mg twice daily during the day and 600 mg at night.  She denies a family history of Parkinson's disease.  She has wondered if she has Parkinson's-like changes.  She snores according to her partner.  She has never had a sleep study. She had a brain MRI without contrast on 04/05/2018 through [facility] and I reviewed the results:  IMPRESSION: Atrophy and chronic microvascular ischemic change in the white matter. No acute abnormality.  She had a cervical spine and lumbar spine MRI without contrast in October 2016 when she was in [place].  I was able to review the report she brought: Study date was 07/09/2015: She had disc protrusion at L2-3, prominent disc bulge and spondylitic changes at L4-5 with moderate bilateral neuroforaminal narrowing and mild bilateral lateral recess stenosis, at L5-S1 she had diffuse disc bulge and bony spinal changes  causing moderate to marked bilateral neural foraminal narrowing without canal stenosis.  She had a prior lumbar spine MRI in April 2012 which was reference in this report.  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  She had no evidence of focal cord compression or abnormal cord signal at the time.  She follows with a pain management physician through [facility]. She is retired, she lives with her partner, she quit smoking in July 2015, she does not drink alcohol, she likes to drink caffeine in the form of coffee, about 5 to 6 cups/day.  She does not drink a whole lot of water, estimates that she drinks about 16 ounces per day.  She has radiating lower back pain to the left leg.  She has an area of numbness in the left chin area where she received radiation therapy.  Her Past Medical History Is Significant For: Past Medical History:  Diagnosis Date  . Allergy   . Asthma   . Cancer ([place])   . Cataract   . DDD (degenerative disc disease), lumbar 06/01/2017  . Depression   . Heart murmur   . Hyperlipidemia   . Peripheral neuropathy 04/30/2017  . Thyroid disease     Her Past Surgical History Is Significant For: Past Surgical History:  Procedure Laterality Date  . bladder cancer  2013  . CARPAL TUNNEL RELEASE  2015  . CATARACT EXTRACTION Bilateral 2012  . CHOLECYSTECTOMY    . SALIVARY GLAND SURGERY Left 2015   benign  . TONSILLECTOMY AND ADENOIDECTOMY  1943    Her Family History Is Significant For: Family History  Problem Relation Age of Onset  . Hyperlipidemia Father   . Stroke Father     Her Social History Is Significant For: Social History   Socioeconomic History  . Marital status: Significant  Other    Spouse name: Not on file  . Number of children: Not on file  . Years of education: Not on file  . Highest education level: Not on file  Occupational History  . Not on file  Social Needs  . Financial resource strain: Not on file  . Food insecurity    Worry: Not on file    Inability: Not on file  . Transportation needs    Medical: Not on file    Non-medical: Not on file  Tobacco Use  . Smoking status: Former Smoker    Quit date: 2015    Years since quitting: 5.5  . Smokeless tobacco: Never Used  Substance and Sexual Activity  . Alcohol use: No  . Drug use: No  . Sexual activity: Not on file  Lifestyle  . Physical activity    Days per week: Not on file    Minutes per session: Not on file  . Stress: Not on file  Relationships  . Social Herbalist on phone: Not on file    Gets together: Not on file    Attends religious service: Not on file    Active member of club or organization: Not on file    Attends meetings of clubs or organizations: Not on file    Relationship status: Not on file  Other Topics Concern  . Not on file  Social History Narrative  . Not on file    Her Allergies Are:  Allergies  Allergen Reactions  . Cephalexin     Other reaction(s): unsure  . Contrast Media [Iodinated Diagnostic Agents]   . Latex     Other reaction(s): blistering, redness   . Mirtazapine     Other reaction(s): nausea  . Penicillins   . Sulfa Antibiotics   . Sulfur Other (See Comments)  :   Her Current Medications Are:  Outpatient Encounter Medications as of 03/17/2019  Medication Sig  . Cholecalciferol (VITAMIN D-3 PO) Take 1,000 Units by mouth daily.  Marland Kitchen gabapentin (NEURONTIN) 300 MG capsule Take 1 capsule (300 mg total) by mouth 3 (three) times daily. (Patient taking differently: Take 300 mg by mouth 3 (three) times daily. Third dose is 600 mg)  . lactobacillus acidophilus (BACID) TABS tablet Take 1 tablet by mouth daily.   . traMADol (ULTRAM) 50 MG tablet  Take 50 mg by mouth 3 (three) times daily as needed.  Marland Kitchen acitretin (SORIATANE) 10 MG capsule Take 10 mg by mouth daily.   . lansoprazole (PREVACID) 30 MG capsule TAKE ONE CAPSULE BY MOUTH TWICE DAILY BEFORE A MEAL.  Marland Kitchen levothyroxine (SYNTHROID, LEVOTHROID) 88 MCG tablet TAKE 1 TABLET(88 MCG) BY MOUTH DAILY BEFORE BREAKFAST  . misoprostol (CYTOTEC) 100 MCG tablet   . simvastatin (ZOCOR) 20 MG tablet TAKE 1 TABLET BY MOUTH DAILY  . triamcinolone cream (KENALOG) 0.1 % APPLY TOPICALLY TO THE AFFECTED AREA TWICE DAILY AS NEEDED FOR SPOTS ON YOUR SKIN   No facility-administered encounter medications on file as of 03/17/2019.   :   Review  of Systems:  Out of a complete 14 point review of systems, all are reviewed and negative with the exception of these symptoms as listed below:  Review of Systems  Neurological:       Pt presents today with pain in the left hip/leg and back. Patient states that she has had a difficulty with balance. She states in the last month it has gradually worsened. After she had radiation the pain became more pronounced. Patient has brought the images that she had completed in 2016.     Objective:  Neurological Exam  Physical Exam Physical Examination:   Vitals:   03/17/19 1422  BP: 122/78  Pulse: 64  Temp: (!) 96.2 F (35.7 C)    General Examination: The patient is a very pleasant 83 y.o. female in no acute distress. She appears well-developed and well-nourished and well groomed.   HEENT: Normocephalic, atraumatic, pupils are equal, round and reactive to light and accommodation. Extraocular tracking is preserved. Hearing is grossly intact. Face is symmetric with normal facial animation and normal facial sensation. Speech is clear with no dysarthria noted. There is no hypophonia. There is no lip, neck/head, jaw or voice tremor. Neck is supple with full range of passive and active motion. There are no carotid bruits on auscultation. Oropharynx exam reveals: moderate  mouth dryness, adequate dental hygiene and moderate airway crowding, due to smaller airway and Redundant soft palate, tonsils absent.  Tongue protrudes centrally in palate elevates symmetrically.  Mallampati is class II.  Chest: Clear to auscultation without wheezing, rhonchi or crackles noted.  Heart: S1+S2+0, regular and normal without murmurs, rubs or gallops noted.   Abdomen: Soft, non-tender and non-distended with normal bowel sounds appreciated on auscultation.  Extremities: There is no pitting edema in the distal lower extremities bilaterally. She wears a compression stocking up to the knee in the left leg only.  Skin: Warm and dry without trophic changes noted.  Musculoskeletal: exam reveals Mildly arthritic changes in both hands, she reports left hip pain.   Neurologically:  Mental status: The patient is awake, alert and oriented in all 4 spheres. Her immediate and remote memory, attention, language skills and fund of knowledge are appropriate. There is no evidence of aphasia, agnosia, apraxia or anomia. Speech is clear with normal prosody and enunciation. Thought process is linear. Mood is normal and affect is normal.  Cranial nerves II - XII are as described above under HEENT exam. In addition: shoulder shrug is normal with equal shoulder height noted. Motor exam: Normal bulk, strength and tone is noted. There is no drift, tremor or rebound. Romberg is negative. Reflexes are 1+ throughout. Babinski: Toes are flexor bilaterally. Fine motor skills and coordination: intact with normal finger taps, normal hand movements, normal rapid alternating patting, normal foot taps and normal foot agility.  Cerebellar testing: No dysmetria or intention tremor on finger to nose testing. Heel to shin is unremarkable bilaterally. There is no truncal or gait ataxia.  Sensory exam: intact to light touch.  Gait, station and balance: She stands With mild difficulty, posture is mildly stooped, could be  appropriate for age, But more in keeping with increase in lumbar kyphosis.  She walks with slight insecurity but preserved arm swing, no shuffling, no freezing  Assessment and Plan:   In summary, Mary Buck is a very pleasant 83 y.o.-year old female with an underlying complex medical history of lumbar spinal stenosis, history of bladder cancer, cervical spinal stenosis and degenerative spine disease, hypothyroidism, obesity, fibromyalgia, history  of neuropathy, history of squamous cell cancer with status post radiation treatment to the left leg as well as treatment with acitretin until recently, who Presents for evaluation of her gait and balance disorder.  On examination, she has a nonspecific insecurity with her walking. Her history and examination are in keeping with More suggestive of a walking and balance problem that is multifactorial, primarily routed in her lumbar spinal stenosis and overall degenerative upper and lower back disease.  She has had appropriate scans, she had a brain MRI less than a year ago.  She is advised that I did not detect any evidence of parkinsonism and she is largely reassured in that regard.We talked about Other contributors to balance issues including suboptimal hydration, sleep deprivation, daytime sleepiness, medication side effects.  She is taking gabapentin and tramadol.  Given her history of daytime sleepiness, I suggested we could proceed with a sleep study to rule out obstructive sleep apnea.  She does snore.  She is not currently in favor of seeking diagnosis for sleep apnea or consider treatment with a CPAP machine, she reports that she wouldLikely not use a CPAP machine.  Nevertheless, she is encouraged to think about it.  Furthermore, she is encouraged to reduce her caffeine intake and increase her water intake. I did not suggest any new medications from my end of things.  I suggested follow-up as needed.  She is encouraged to continue to follow with her pain  management doctor and orthopedic doctors for her arthritis, particularly her back disease.  I answered all their questions today and the patient and her partner were in agreement. Thank you very much for allowing me to participate in the care of this nice patient. If I can be of any further assistance to you please do not hesitate to call me at [phone].  Sincerely,   Star Age, MD, PhD

## 2019-03-17 NOTE — Patient Instructions (Addendum)
I believe your Difficulty with your walking and balance are primarily secondary to your degenerative back disease.  You also have findings of degenerative cervical disease in the neck.  In addition, some of the medications you are taking can affect your balance including the tramadol and gabapentin.  Suboptimal hydration can also play a role.  I would recommend that you increase your water intake.   You have no signs of Parkinson's-like changes thankfully.   Please remember to stand up slowly and get your bearings first turn slowly, no bending down to pick anything, no heavy lifting, be extra careful at night and first thing in the morning. Also, be careful in the Bathroom and the kitchen.   Remember to drink plenty of fluid, eat healthy meals and do not skip any meals. Try to eat protein with a every meal and eat a healthy snack such as fruit or nuts or yogurt in between meals. Try to keep a regular sleep-wake schedule and try to exercise daily, particularly in the form of walking, 20-30 minutes a day, if you can. Please consider using your cane for safety.  As far as your medications are concerned, I would like to suggest no new medications.   As far as diagnostic testing: You have had a brain MRI and spine MRI before.  I do not recommend additional scans from my end of things.  I would recommend a sleep study to rule out obstructive sleep apnea as you also report sleepiness during the day and memory loss.  If you change your mind about coming in for a sleep study, I would be happy to order one.  I can see you back as needed.

## 2019-03-29 DIAGNOSIS — M48062 Spinal stenosis, lumbar region with neurogenic claudication: Secondary | ICD-10-CM | POA: Diagnosis not present

## 2019-03-29 DIAGNOSIS — M5136 Other intervertebral disc degeneration, lumbar region: Secondary | ICD-10-CM | POA: Diagnosis not present

## 2019-03-29 DIAGNOSIS — M797 Fibromyalgia: Secondary | ICD-10-CM | POA: Diagnosis not present

## 2019-03-30 ENCOUNTER — Other Ambulatory Visit: Payer: Self-pay

## 2019-03-30 ENCOUNTER — Ambulatory Visit: Payer: Self-pay | Admitting: Family Medicine

## 2019-03-30 NOTE — Telephone Encounter (Signed)
Pt. Reports she started having nausea, chills,body aches "a few days ago." States her last radiation treatment was in December, and is not sure it is related. Denies fever.States she can not do a virtual visit. No answer in the practice. Please advise pt.  Answer Assessment - Initial Assessment Questions 1. NAUSEA SEVERITY: "How bad is the nausea?" (e.g., mild, moderate, severe; dehydration, weight loss)   - MILD: loss of appetite without change in eating habits   - MODERATE: decreased oral intake without significant weight loss, dehydration, or malnutrition   - SEVERE: inadequate caloric or fluid intake, significant weight loss, symptoms of dehydration     Moderate 2. ONSET: "When did the nausea begin?"     Several days 3. VOMITING: "Any vomiting?" If so, ask: "How many times today?"     No 4. RECURRENT SYMPTOM: "Have you had nausea before?" If so, ask: "When was the last time?" "What happened that time?"     Yes 5. CAUSE: "What do you think is causing the nausea?"     Unsure 6. PREGNANCY: "Is there any chance you are pregnant?" (e.g., unprotected intercourse, missed birth control pill, broken condom)     No  Protocols used: NAUSEA-A-AH

## 2019-03-30 NOTE — Telephone Encounter (Signed)
Needs telephone visit if not available for virtual.

## 2019-03-31 ENCOUNTER — Ambulatory Visit (INDEPENDENT_AMBULATORY_CARE_PROVIDER_SITE_OTHER): Payer: Medicare Other | Admitting: Family Medicine

## 2019-03-31 ENCOUNTER — Encounter: Payer: Self-pay | Admitting: Family Medicine

## 2019-03-31 DIAGNOSIS — Z20822 Contact with and (suspected) exposure to covid-19: Secondary | ICD-10-CM

## 2019-03-31 DIAGNOSIS — R2681 Unsteadiness on feet: Secondary | ICD-10-CM | POA: Diagnosis not present

## 2019-03-31 DIAGNOSIS — Z20828 Contact with and (suspected) exposure to other viral communicable diseases: Secondary | ICD-10-CM

## 2019-03-31 NOTE — Progress Notes (Signed)
Mary Buck [address], [place], [place] [address] [phone]  Date:  03/31/2019   Name:  Mary Buck   DOB:  [birth year]   MRN:  [id number]  PCP:  Darreld Mclean, MD    Chief Complaint: No chief complaint on file.   History of Present Illness:  Mary Buck is a 83 y.o. 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 She has a nonspecific gait difficulty, she did see neurology earlier this month and noted really a nonspecific insecurity and walking-they felt this was likely due to her spinal stenosis and back disease.  Did not feel that she had Parkinson's disease  She has noted more pain in her legs and back recently - she is getting an epidural steroid injection on 8/3 She notes that she feels tired-at the office visit was for illness, but patient is actually more concerned about her persistent back pain and gait difficulty.  She was evaluated by neurology as above-I explained that they felt her biggest issue is actually her back. She does have difficulty walking, is often afraid that she might fall.  She would be interested in physical therapy, I will refer her for same  Asked her about any symptoms of illness: Intermittent ST which she attributes to allergies Mild cough but she thinks this also is allergies No fever noted  Overall she has not felt sick She is not sure if she would like to be tested for COVID- I will order this test for her however in case she wants to have it done  Explained when and where she may be tested  Patient Active  Problem List   Diagnosis Date Noted  . Pedal edema 01/31/2018  . Cellulitis 01/31/2018  . Urinary hesitancy 01/31/2018  . Squamous cell carcinoma of skin of left lower limb, including hip 12/16/2017  . Plantar fasciitis 07/07/2017  . DDD (degenerative disc disease), lumbar 06/01/2017  . Fibromyalgia 06/01/2017  . Spinal stenosis at L4-L5 level 04/30/2017  . Peripheral neuropathy 04/30/2017  . Encounter for medication monitoring 03/05/2017  . Bladder cancer ([place]) 08/20/2015  . Cervical stenosis of spinal canal 07/27/2015  . Spinal stenosis of cervical region 07/27/2015  . Spondylolisthesis 07/27/2015  . Hypothyroid 07/05/2015  . Abdominal pain, acute, left upper quadrant 06/28/2015  . Basal cell carcinoma, face 06/28/2015  . Bilateral groin pain 06/28/2015  . Chronic pain 06/28/2015  . Fatigue 06/28/2015  . Hyperlipidemia 06/28/2015  . Pain in joints 06/28/2015  . Paresthesia of lower lip 06/28/2015  . Pulmonary nodule 06/28/2015  . Pustulosis palmaris et plantaris 06/28/2015  . Ulcer of nose 06/28/2015  . 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  . Mass of parotid gland 03/24/2014    Past Medical History:  Diagnosis Date  . Allergy   . Asthma   . Cancer ([place])   . Cataract   . DDD (degenerative disc disease), lumbar 06/01/2017  . Depression   . Heart murmur   . Hyperlipidemia   . Peripheral neuropathy 04/30/2017  . Thyroid disease     Past Surgical History:  Procedure Laterality Date  . bladder cancer  2013  .  CARPAL TUNNEL RELEASE  2015  . CATARACT EXTRACTION Bilateral 2012  . CHOLECYSTECTOMY    . SALIVARY GLAND SURGERY Left 2015   benign  . TONSILLECTOMY AND ADENOIDECTOMY  1943    Social History   Tobacco Use  . Smoking status: Former Smoker    Quit date: 2015    Years since quitting: 5.5  . Smokeless tobacco: Never Used  Substance Use Topics  . Alcohol use: No  . Drug use: No    Family History  Problem Relation Age of Onset  . Hyperlipidemia Father   . Stroke  Father     Allergies  Allergen Reactions  . Cephalexin     Other reaction(s): unsure  . Contrast Media [Iodinated Diagnostic Agents]   . Latex     Other reaction(s): blistering, redness   . Mirtazapine     Other reaction(s): nausea  . Penicillins   . Sulfa Antibiotics   . Sulfur Other (See Comments)    Medication list has been reviewed and updated.  Current Outpatient Medications on File Prior to Visit  Medication Sig Dispense Refill  . acitretin (SORIATANE) 10 MG capsule Take 10 mg by mouth daily.     . Cholecalciferol (VITAMIN D-3 PO) Take 1,000 Units by mouth daily.    Marland Kitchen gabapentin (NEURONTIN) 300 MG capsule Take 1 capsule (300 mg total) by mouth 3 (three) times daily. (Patient taking differently: Take 300 mg by mouth 3 (three) times daily. Third dose is 600 mg) 90 capsule 4  . lactobacillus acidophilus (BACID) TABS tablet Take 1 tablet by mouth daily.     . lansoprazole (PREVACID) 30 MG capsule TAKE ONE CAPSULE BY MOUTH TWICE DAILY BEFORE A MEAL. 180 capsule 3  . levothyroxine (SYNTHROID, LEVOTHROID) 88 MCG tablet TAKE 1 TABLET(88 MCG) BY MOUTH DAILY BEFORE BREAKFAST 90 tablet 1  . misoprostol (CYTOTEC) 100 MCG tablet     . simvastatin (ZOCOR) 20 MG tablet TAKE 1 TABLET BY MOUTH DAILY 90 tablet 1  . traMADol (ULTRAM) 50 MG tablet Take 50 mg by mouth 3 (three) times daily as needed.    . triamcinolone cream (KENALOG) 0.1 % APPLY TOPICALLY TO THE AFFECTED AREA TWICE DAILY AS NEEDED FOR SPOTS ON YOUR SKIN 45 g 1   No current facility-administered medications on file prior to visit.     Review of Systems:  As per HPI- otherwise negative.   Physical Examination: There were no vitals filed for this visit. There were no vitals filed for this visit. There is no height or weight on file to calculate BMI. Ideal Body Weight:    Spoke with patient on the phone today, she does not have video capability.  She sounds well, no cough, wheezing, distress is noted   Assessment and  Plan:   ICD-10-CM   1. Gait instability  R26.81 Ambulatory referral to Physical Therapy  2. Exposure to Covid-19 Virus  Z20.828 Novel Coronavirus, NAA ([facility])   Visit today for a couple of concerns.  She has chronic back problems, is having epidural steroid injections soon.  Her back seems to be the crux of her gait difficulty.  She would like to try physical therapy which I agree is a good idea.  Placed this referral for her today  Patient may wish to be tested for COVID-19 in the next several days.  I ordered this test for her and explained the testing process  Follow-up: No follow-ups on file.  No orders of the defined types were placed in this  encounter.  Orders Placed This Encounter  Procedures  . Novel Coronavirus, NAA ([facility])  . Ambulatory referral to Physical Therapy    @SIGN @   Spoke to pt for 13 minutes today  Signed Lamar Blinks, MD

## 2019-04-02 ENCOUNTER — Other Ambulatory Visit: Payer: Self-pay | Admitting: Family Medicine

## 2019-04-02 DIAGNOSIS — L409 Psoriasis, unspecified: Secondary | ICD-10-CM

## 2019-04-04 DIAGNOSIS — D485 Neoplasm of uncertain behavior of skin: Secondary | ICD-10-CM | POA: Diagnosis not present

## 2019-04-04 DIAGNOSIS — Z85828 Personal history of other malignant neoplasm of skin: Secondary | ICD-10-CM | POA: Diagnosis not present

## 2019-04-04 DIAGNOSIS — C44722 Squamous cell carcinoma of skin of right lower limb, including hip: Secondary | ICD-10-CM | POA: Diagnosis not present

## 2019-04-04 DIAGNOSIS — B078 Other viral warts: Secondary | ICD-10-CM | POA: Diagnosis not present

## 2019-04-13 ENCOUNTER — Encounter: Payer: Self-pay | Admitting: Physical Therapy

## 2019-04-13 ENCOUNTER — Ambulatory Visit: Payer: Medicare Other | Attending: Family Medicine | Admitting: Physical Therapy

## 2019-04-13 ENCOUNTER — Other Ambulatory Visit: Payer: Self-pay

## 2019-04-13 DIAGNOSIS — R2681 Unsteadiness on feet: Secondary | ICD-10-CM | POA: Insufficient documentation

## 2019-04-13 DIAGNOSIS — R2689 Other abnormalities of gait and mobility: Secondary | ICD-10-CM | POA: Diagnosis not present

## 2019-04-13 DIAGNOSIS — R293 Abnormal posture: Secondary | ICD-10-CM | POA: Diagnosis not present

## 2019-04-13 NOTE — Therapy (Signed)
[facility] [address]  [place], [place], [address] Phone: [phone]   Fax:  [phone]  Physical Therapy Evaluation  Patient Details  Name: Mary Buck MRN: [id number] Date of Birth: [birth year]  Referring Provider (PT): Lamar Blinks, MD   Encounter Date: 04/13/2019  PT End of Session - 04/13/19 1550    Visit Number  1    Number of Visits  7    Date for PT Re-Evaluation  05/25/19    Authorization Type  Medicare & [employer]    PT Start Time  9833    PT Stop Time  1450    PT Time Calculation (min)  46 min    Equipment Utilized During Treatment  Gait belt    Activity Tolerance  Patient tolerated treatment well    Behavior During Therapy  [facility] for tasks assessed/performed       Past Medical History:  Diagnosis Date  . Allergy   . Asthma   . Cancer ([place])   . Cataract   . DDD (degenerative disc disease), lumbar 06/01/2017  . Depression   . Heart murmur   . Hyperlipidemia   . Peripheral neuropathy 04/30/2017  . Thyroid disease     Past Surgical History:  Procedure Laterality Date  . bladder cancer  2013  . CARPAL TUNNEL RELEASE  2015  . CATARACT EXTRACTION Bilateral 2012  . CHOLECYSTECTOMY    . SALIVARY GLAND SURGERY Left 2015   benign  . TONSILLECTOMY AND ADENOIDECTOMY  1943    There were no vitals filed for this visit.   Subjective Assessment - 04/13/19 1409    Subjective  Patient reports that she "doesn't have any balance." Believes that this has been going on for the past 7 months since she had radiation to her L leg for skin cancer. She feels unsteady with walking- uses a SPC at the end of the day d/t feeling tired. Also feels unsteady when standing up from a chair after prolonged sitting. Mentions she feels lightheaded with bending over to pick something up from the ground. Believes some of her imbalance is caused by her back pain and is scheduled for a lumbar epidural.    Pertinent History   thyroid disease, peripheral neuropathy, HLD, depression, lumbar DDD, hx skin CA with radiation, asthma, carpal tunnel release    Limitations  Standing;Walking    Patient Stated Goals  "would like to walk in a straight line and without feeling exhausted"    Currently in Pain?  No/denies         [facility] PT Assessment - 04/13/19 1418      Assessment   Medical Diagnosis  Gait instability    Referring Provider (PT)  Lamar Blinks, MD    Onset Date/Surgical Date  09/12/18    Prior Therapy  Yes      Precautions   Precautions  --   hx of skin CA, peripheral neuropathy     Balance Screen   Has the patient fallen in the past 6 months  No    Has the patient had a decrease in activity level because of a fear of falling?   Yes    Is the patient reluctant to leave their home because of a fear of falling?   No      Home Environment   Living Environment  Private residence    Living Arrangements  Spouse/significant other    Available Help at Discharge  Family  Type of Home  House   townhome   Home Access  Level entry    [place] - single point      Prior Function   Level of Independence  Independent    Vocation  Retired    Leisure  walking       Cognition   Overall Cognitive Status  Within Functional Limits for tasks assessed      Sensation   Light Touch  Appears Intact      Coordination   Gross Motor Movements are Fluid and Coordinated  Yes      Posture/Postural Control   Posture/Postural Control  Postural limitations    Postural Limitations  Rounded Shoulders;Forward head;Posterior pelvic tilt;Increased thoracic kyphosis;Flexed trunk      ROM / Strength   AROM / PROM / Strength  Strength;AROM      AROM   AROM Assessment Site  Ankle    Right/Left Ankle  Right;Left    Right Ankle Dorsiflexion  8    Left Ankle Dorsiflexion  5      Strength   Strength Assessment Site  Hip;Knee;Ankle    Right/Left Hip  Right;Left    Right Hip  Flexion  5/5    Right Hip ABduction  4/5    Right Hip ADduction  4+/5    Left Hip Flexion  4+/5    Left Hip ABduction  4+/5    Left Hip ADduction  4+/5    Right/Left Knee  Right;Left    Right Knee Flexion  5/5    Right Knee Extension  5/5    Left Knee Flexion  4+/5    Left Knee Extension  5/5    Right/Left Ankle  Right;Left    Right Ankle Dorsiflexion  4+/5    Right Ankle Plantar Flexion  4/5    Left Ankle Dorsiflexion  4+/5    Left Ankle Plantar Flexion  4/5      Ambulation/Gait   Assistive device  Straight cane    Gait Pattern  Step-through pattern;Decreased step length - right;Decreased step length - left;Decreased dorsiflexion - right;Decreased dorsiflexion - left;Trendelenburg;Lateral hip instability;Trunk flexed;Poor foot clearance - right;Poor foot clearance - left    Ambulation Surface  Level;Indoor    Gait velocity  decreased      Balance   Balance Assessed  Yes      Static Standing Balance   Static Standing - Comment/# of Minutes  M-CTSIB: EO/firm- mild sway, EC/firm sway- moderate sway; EO/foam- severe sway and LOB; EC/foam- unable to test      Standardized Balance Assessment   Standardized Balance Assessment  Dynamic Gait Index      Dynamic Gait Index   Level Surface  Normal    Change in Gait Speed  Mild Impairment    Gait with Horizontal Head Turns  Mild Impairment    Gait with Vertical Head Turns  Mild Impairment    Gait and Pivot Turn  Normal    Step Over Obstacle  Normal    Step Around Obstacles  Normal    Steps  Mild Impairment    Total Score  20        Objective measurements completed on examination: See above findings.     PT Education - 04/13/19 1550    Education Details  prognosis, POC, HEP    Person(s) Educated  Patient    Methods  Explanation;Demonstration;Tactile cues;Verbal cues;Handout    Comprehension  Verbalized  understanding;Returned demonstration       PT Short Term Goals - 04/13/19 1601      PT SHORT TERM GOAL #1   Title   Patient to be independent with initial HEP.        PT Long Term Goals - 04/13/19 1602      PT LONG TERM GOAL #1   Title  Patient to be independent with advanced HEP.    Time  6    Period  Weeks    Status  New    Target Date  05/25/19      PT LONG TERM GOAL #2   Title  Patient to demonstrate 10 degrees of ankle dorsiflexion AROM.    Time  6    Period  Weeks    Status  New    Target Date  05/25/19      PT LONG TERM GOAL #3   Title  Patient to demonstrate mild-moderate sway with M-CTSIB condition EC/foam surface in order to improve balance on compliant surfaces and in dim lighting.    Time  6    Period  Weeks    Status  New    Target Date  05/25/19      PT LONG TERM GOAL #4   Title  Patient to report 50% improvement in feeling of imbalance with STS transfers.    Time  6    Period  Weeks    Status  New    Target Date  05/25/19             Plan - 04/13/19 1554    Clinical Impression Statement  Patient is an 83y/o F presenting to OPPT with c/o imbalance and gait instability for the last 7 months. Patient with hx of peripheral neuropathy and spinal stenosis, both of which she believes contribute to her imbalance. Patient uses SPC towards the end of the day d/t fatigue. Reports that she feels unsteady with standing up from a chair after prolonged sitting. Patient today with good overall LE strength, limited ankle dorsiflexion AROM, gait deviations, and unsteadiness with eyes closed and on compliant surfaces. Patient scored 20/24 on DGI, indicated decreased risk of falls. However, showing room for improvement with dynamic gait activities. Educated patient on stretching and balance HEP to be performed at counter top for safety. Patient reported understanding. Would benefit from skilled PT services 1x/week for 6 weeks to address aforementioned impairments.    Personal Factors and Comorbidities  Age;Comorbidity 3+;Time since onset of injury/illness/exacerbation;Past/Current  Experience;Fitness    Comorbidities  thyroid disease, peripheral neuropathy, HLD, depression, lumbar DDD, hx skin CA with radiation, asthma, carpal tunnel release    Examination-Activity Limitations  Sit;Bend;Squat;Caring for Others;Stairs;Carry;Stand;Transfers;Lift;Locomotion Level;Reach Overhead    Examination-Participation Restrictions  Church;School;Cleaning;Shop;Community Activity;Driving;Yard Work;Interpersonal Relationship;Laundry;Meal Prep    Stability/Clinical Decision Making  Stable/Uncomplicated    Clinical Decision Making  Low    Rehab Potential  Good    PT Frequency  1x / week    PT Duration  6 weeks    PT Treatment/Interventions  ADLs/Self Care Home Management;Cryotherapy;Moist Heat;Balance training;Therapeutic exercise;Therapeutic activities;Functional mobility training;Stair training;Gait training;Neuromuscular re-education;Patient/family education;Manual techniques;Energy conservation;Passive range of motion    PT Next Visit Plan  reassess HEP    Consulted and Agree with Plan of Care  Patient       Patient will benefit from skilled therapeutic intervention in order to improve the following deficits and impairments:  Abnormal gait, Decreased endurance, Decreased activity tolerance, Pain, Decreased balance, Difficulty walking, Improper body mechanics,  Decreased range of motion, Impaired flexibility, Postural dysfunction  Visit Diagnosis: 1. Unsteadiness on feet   2. Other abnormalities of gait and mobility   3. Abnormal posture        Problem List Patient Active Problem List   Diagnosis Date Noted  . Pedal edema 01/31/2018  . Cellulitis 01/31/2018  . Urinary hesitancy 01/31/2018  . Squamous cell carcinoma of skin of left lower limb, including hip 12/16/2017  . Plantar fasciitis 07/07/2017  . DDD (degenerative disc disease), lumbar 06/01/2017  . Fibromyalgia 06/01/2017  . Spinal stenosis at L4-L5 level 04/30/2017  . Peripheral neuropathy 04/30/2017  . Encounter for  medication monitoring 03/05/2017  . Bladder cancer ([place]) 08/20/2015  . Cervical stenosis of spinal canal 07/27/2015  . Spinal stenosis of cervical region 07/27/2015  . Spondylolisthesis 07/27/2015  . Hypothyroid 07/05/2015  . Abdominal pain, acute, left upper quadrant 06/28/2015  . Basal cell carcinoma, face 06/28/2015  . Bilateral groin pain 06/28/2015  . Chronic pain 06/28/2015  . Fatigue 06/28/2015  . Hyperlipidemia 06/28/2015  . Pain in joints 06/28/2015  . Paresthesia of lower lip 06/28/2015  . Pulmonary nodule 06/28/2015  . Pustulosis palmaris et plantaris 06/28/2015  . Ulcer of nose 06/28/2015  . 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  . Mass of parotid gland 03/24/2014     Janene Harvey, PT, DPT 04/13/19 4:07 PM   [facility] [address]  [place], [place], [address] Phone: [phone]   Fax:  [phone]  Name: Mary Buck MRN: [id number] Date of Birth: March 30, 1936

## 2019-04-18 ENCOUNTER — Other Ambulatory Visit: Payer: Self-pay

## 2019-04-18 DIAGNOSIS — M5416 Radiculopathy, lumbar region: Secondary | ICD-10-CM | POA: Diagnosis not present

## 2019-04-18 DIAGNOSIS — M48062 Spinal stenosis, lumbar region with neurogenic claudication: Secondary | ICD-10-CM | POA: Diagnosis not present

## 2019-04-19 ENCOUNTER — Other Ambulatory Visit (HOSPITAL_BASED_OUTPATIENT_CLINIC_OR_DEPARTMENT_OTHER): Payer: Self-pay | Admitting: Family Medicine

## 2019-04-19 DIAGNOSIS — Z1231 Encounter for screening mammogram for malignant neoplasm of breast: Secondary | ICD-10-CM

## 2019-04-25 ENCOUNTER — Other Ambulatory Visit: Payer: Self-pay

## 2019-04-25 ENCOUNTER — Encounter: Payer: Self-pay | Admitting: Physical Therapy

## 2019-04-25 ENCOUNTER — Ambulatory Visit: Payer: Medicare Other | Attending: Family Medicine | Admitting: Physical Therapy

## 2019-04-25 DIAGNOSIS — R2689 Other abnormalities of gait and mobility: Secondary | ICD-10-CM | POA: Diagnosis not present

## 2019-04-25 DIAGNOSIS — R2681 Unsteadiness on feet: Secondary | ICD-10-CM | POA: Diagnosis not present

## 2019-04-25 DIAGNOSIS — R293 Abnormal posture: Secondary | ICD-10-CM | POA: Insufficient documentation

## 2019-04-25 NOTE — Therapy (Signed)
[facility] [address]  [place], [place], [address] Phone: [phone]   Fax:  [phone]  Physical Therapy Treatment  Patient Details  Name: Mary Buck MRN: [id number] Date of Birth: [birth year] Referring Provider (PT): Lamar Blinks, MD   Encounter Date: 04/25/2019  PT End of Session - 04/25/19 1843    Visit Number  2    Number of Visits  7    Date for PT Re-Evaluation  05/25/19    Authorization Type  Medicare & [employer]    PT Start Time  7322    PT Stop Time  1412   moist heat   PT Time Calculation (min)  54 min    Equipment Utilized During Treatment  Gait belt    Activity Tolerance  Patient tolerated treatment well    Behavior During Therapy  [facility] for tasks assessed/performed       Past Medical History:  Diagnosis Date  . Allergy   . Asthma   . Cancer ([place])   . Cataract   . DDD (degenerative disc disease), lumbar 06/01/2017  . Depression   . Heart murmur   . Hyperlipidemia   . Peripheral neuropathy 04/30/2017  . Thyroid disease     Past Surgical History:  Procedure Laterality Date  . bladder cancer  2013  . CARPAL TUNNEL RELEASE  2015  . CATARACT EXTRACTION Bilateral 2012  . CHOLECYSTECTOMY    . SALIVARY GLAND SURGERY Left 2015   benign  . TONSILLECTOMY AND ADENOIDECTOMY  1943    There were no vitals filed for this visit.  Subjective Assessment - 04/25/19 1319    Subjective  Reports that she had an injection in her back a week ago. Now having pain in her L leg and knee. "I'm not in a good place right now." Denoies falls since last session.    Pertinent History  thyroid disease, peripheral neuropathy, HLD, depression, lumbar DDD, hx skin CA with radiation, asthma, carpal tunnel release    Patient Stated Goals  "would like to walk in a straight line and without feeling exhausted"    Currently in Pain?  Yes    Pain Score  7     Pain Location  Knee    Pain Orientation  Left    Pain  Descriptors / Indicators  Aching;Throbbing    Pain Type  Acute pain    Multiple Pain Sites  Yes                  [facility] Adult PT Treatment/Exercise - 04/25/19 0001      Neuro Re-ed    Neuro Re-ed Details   romberg with EC with and without perturbations x4 min   tendency for pos LOB     Exercises   Exercises  Knee/Hip      Knee/Hip Exercises: Stretches   Gastroc Stretch  Right;Left;2 reps;30 seconds    Gastroc Stretch Limitations  at counter top      Knee/Hip Exercises: Aerobic   Nustep  L1 x 19min   discontinuedd/t pain      Knee/Hip Exercises: Standing   Heel Raises  Both;1 set;20 reps    Heel Raises Limitations  heel/toe raise at counter top    Forward Step Up  Right;Left;1 set;10 reps;Hand Hold: 0    Forward Step Up Limitations  step up on foam with have hovering over sink   more imbalance on L LE     Knee/Hip Exercises: Seated  Sit to Sand  2 sets;10 reps;without UE support   10x on foam; 10x EC     Modalities   Modalities  Moist Heat      Moist Heat Therapy   Number Minutes Moist Heat  10 Minutes    Moist Heat Location  Lumbar Spine             PT Education - 04/25/19 1843    Education Details  update to HEP    Person(s) Educated  Patient    Methods  Explanation;Demonstration;Tactile cues;Verbal cues;Handout    Comprehension  Verbalized understanding;Returned demonstration       PT Short Term Goals - 04/25/19 1847      PT SHORT TERM GOAL #1   Title  Patient to be independent with initial HEP.    Status  On-going        PT Long Term Goals - 04/25/19 1848      PT LONG TERM GOAL #1   Title  Patient to be independent with advanced HEP.    Time  6    Period  Weeks    Status  On-going      PT LONG TERM GOAL #2   Title  Patient to demonstrate 10 degrees of ankle dorsiflexion AROM.    Time  6    Period  Weeks    Status  On-going      PT LONG TERM GOAL #3   Title  Patient to demonstrate mild-moderate sway with M-CTSIB condition  EC/foam surface in order to improve balance on compliant surfaces and in dim lighting.    Time  6    Period  Weeks    Status  On-going      PT LONG TERM GOAL #4   Title  Patient to report 50% improvement in feeling of imbalance with STS transfers.    Time  6    Period  Weeks    Status  On-going            Plan - 04/25/19 1844    Clinical Impression Statement  Patent arrived to session with report of pain in L LE after receiving epidural in lumbar spine a week ago. Worked on STS on compliant surface and with eyes closed to challenge dynamic balance. Patient with tendency to rush and requiring cues to slow down.  Required mod A to recover balance with first rep of STS with EC. With cues to activate glutes in order to more quickly stand upright, patient was able to perform this exercise more safely with subsequent reps. Demonstrated a wide excursion of sway with Romberg stance but able to maintain balance without having to use reaching strategy often. Patient with difficulty with heel/toe raises, thus updated this into HEP. Patient reported understanding. Ended session with moist heat to LB d/t patient's back pain. No complaints at end of session.    Comorbidities  thyroid disease, peripheral neuropathy, HLD, depression, lumbar DDD, hx skin CA with radiation, asthma, carpal tunnel release    PT Treatment/Interventions  ADLs/Self Care Home Management;Cryotherapy;Moist Heat;Balance training;Therapeutic exercise;Therapeutic activities;Functional mobility training;Stair training;Gait training;Neuromuscular re-education;Patient/family education;Manual techniques;Energy conservation;Passive range of motion    PT Next Visit Plan  progress balance with EC/ foam    Consulted and Agree with Plan of Care  Patient       Patient will benefit from skilled therapeutic intervention in order to improve the following deficits and impairments:  Abnormal gait, Decreased endurance, Decreased activity tolerance,  Pain, Decreased balance, Difficulty walking,  Improper body mechanics, Decreased range of motion, Impaired flexibility, Postural dysfunction  Visit Diagnosis: 1. Unsteadiness on feet   2. Other abnormalities of gait and mobility   3. Abnormal posture        Problem List Patient Active Problem List   Diagnosis Date Noted  . Pedal edema 01/31/2018  . Cellulitis 01/31/2018  . Urinary hesitancy 01/31/2018  . Squamous cell carcinoma of skin of left lower limb, including hip 12/16/2017  . Plantar fasciitis 07/07/2017  . DDD (degenerative disc disease), lumbar 06/01/2017  . Fibromyalgia 06/01/2017  . Spinal stenosis at L4-L5 level 04/30/2017  . Peripheral neuropathy 04/30/2017  . Encounter for medication monitoring 03/05/2017  . Bladder cancer ([place]) 08/20/2015  . Cervical stenosis of spinal canal 07/27/2015  . Spinal stenosis of cervical region 07/27/2015  . Spondylolisthesis 07/27/2015  . Hypothyroid 07/05/2015  . Abdominal pain, acute, left upper quadrant 06/28/2015  . Basal cell carcinoma, face 06/28/2015  . Bilateral groin pain 06/28/2015  . Chronic pain 06/28/2015  . Fatigue 06/28/2015  . Hyperlipidemia 06/28/2015  . Pain in joints 06/28/2015  . Paresthesia of lower lip 06/28/2015  . Pulmonary nodule 06/28/2015  . Pustulosis palmaris et plantaris 06/28/2015  . Ulcer of nose 06/28/2015  . 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  . Mass of parotid gland 03/24/2014     Janene Harvey, PT, DPT 04/25/19 6:49 PM   [facility] [address]  [place], [place], [address] Phone: [phone]   Fax:  [phone]  Name: Mary Buck MRN: [id number] Date of Birth: [birth year]

## 2019-04-29 ENCOUNTER — Other Ambulatory Visit: Payer: Self-pay

## 2019-05-01 ENCOUNTER — Other Ambulatory Visit: Payer: Self-pay | Admitting: Family Medicine

## 2019-05-01 NOTE — Progress Notes (Addendum)
[facility] [place], [place], [place] [address] [phone]  Date:  05/02/2019   Name:  Mary Buck   DOB:  23-Apr-1936   MRN:  [id number]  PCP:  Darreld Mclean, MD    Chief Complaint: Back Pain (saw dr Evelene Croon for injection 2 weeks ago), Fatigue (cold hands, cold nose, fatigue), and Leg Pain (left leg pain, did PT feels exhuasted )   History of Present Illness:  Mary Buck is a 83 y.o. very pleasant female patient who presents with the following:  Here today for follow-up visit and to discuss GERD, back and leg I last saw Mary Buck about 1 month ago for virtual visit  She has history of spinal stenosis, hyperlipidemia, neuropathy, extensive squamous cell carcinoma of the left leg status post skin graft, hypothyroidism I referred her to physical therapy and she did go for evaluation and one other session so far.  Physical therapy suggested once weekly sessions for 6 weeks as well as a home exercise program  She also saw Dr. Evelene Croon and had an epidural steroid injection on August 3 for her lumbosacral radiculopathy The injection was quite painful and continued to be painful for 10 days It did ultimately give her some relief on the right side at least  Her left leg is still painful however  Today Mary Buck notes that her nose may feel cold, her hands may feel cold, and she sometimes has chills or hot flashes -she wonders if this may mean that her thyroid is off She also notes that she may feel tired more esp after she eats a meal during the day She does not want to take a nap because she feels like that is admitting you are old  Can suggest DEXA scan- she thinks over 2 years ago  Also eligible for Shingrix Last labs 6 months ago, can do since she is in the office today  Still seeing [facility] urology Dr. Thomes Lolling for history of bladder cancer-this was diagnosed in 2013 She sees them annually, last visit was in  January  All is ok from this standpoint Patient Active Problem List   Diagnosis Date Noted  . Pedal edema 01/31/2018  . Cellulitis 01/31/2018  . Urinary hesitancy 01/31/2018  . Squamous cell carcinoma of skin of left lower limb, including hip 12/16/2017  . Plantar fasciitis 07/07/2017  . DDD (degenerative disc disease), lumbar 06/01/2017  . Fibromyalgia 06/01/2017  . Spinal stenosis at L4-L5 level 04/30/2017  . Peripheral neuropathy 04/30/2017  . Encounter for medication monitoring 03/05/2017  . Bladder cancer ([place]) 08/20/2015  . Cervical stenosis of spinal canal 07/27/2015  . Spinal stenosis of cervical region 07/27/2015  . Spondylolisthesis 07/27/2015  . Hypothyroid 07/05/2015  . Abdominal pain, acute, left upper quadrant 06/28/2015  . Basal cell carcinoma, face 06/28/2015  . Bilateral groin pain 06/28/2015  . Chronic pain 06/28/2015  . Fatigue 06/28/2015  . Hyperlipidemia 06/28/2015  . Pain in joints 06/28/2015  . Paresthesia of lower lip 06/28/2015  . Pulmonary nodule 06/28/2015  . Pustulosis palmaris et plantaris 06/28/2015  . Ulcer of nose 06/28/2015  . 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  . Mass of parotid gland 03/24/2014    Past Medical History:  Diagnosis Date  . Allergy   . Asthma   . Cancer ([place])   . Cataract   . DDD (degenerative disc disease), lumbar 06/01/2017  . Depression   . Heart murmur   . Hyperlipidemia   .  Peripheral neuropathy 04/30/2017  . Thyroid disease     Past Surgical History:  Procedure Laterality Date  . bladder cancer  2013  . CARPAL TUNNEL RELEASE  2015  . CATARACT EXTRACTION Bilateral 2012  . CHOLECYSTECTOMY    . SALIVARY GLAND SURGERY Left 2015   benign  . TONSILLECTOMY AND ADENOIDECTOMY  1943    Social History   Tobacco Use  . Smoking status: Former Smoker    Quit date: 2015    Years since quitting: 5.6  . Smokeless tobacco: Never Used  Substance Use Topics  . Alcohol use: No  . Drug use: No    Family History  Problem  Relation Age of Onset  . Hyperlipidemia Father   . Stroke Father     Allergies  Allergen Reactions  . Cephalexin     Other reaction(s): unsure  . Contrast Media [Iodinated Diagnostic Agents]   . Latex     Other reaction(s): blistering, redness   . Mirtazapine     Other reaction(s): nausea  . Penicillins   . Sulfa Antibiotics   . Sulfur Other (See Comments)  . Acitretin Rash    Medication list has been reviewed and updated.  Current Outpatient Medications on File Prior to Visit  Medication Sig Dispense Refill  . Cholecalciferol (VITAMIN D-3 PO) Take 1,000 Units by mouth daily.    Marland Kitchen gabapentin (NEURONTIN) 300 MG capsule Take 1 capsule (300 mg total) by mouth 3 (three) times daily. (Patient taking differently: Take 300 mg by mouth 3 (three) times daily. Third dose is 600 mg) 90 capsule 4  . lactobacillus acidophilus (BACID) TABS tablet Take 1 tablet by mouth daily.     . lansoprazole (PREVACID) 30 MG capsule TAKE ONE CAPSULE BY MOUTH TWICE DAILY BEFORE A MEAL. 180 capsule 3  . levothyroxine (SYNTHROID, LEVOTHROID) 88 MCG tablet TAKE 1 TABLET(88 MCG) BY MOUTH DAILY BEFORE BREAKFAST 90 tablet 1  . misoprostol (CYTOTEC) 100 MCG tablet     . simvastatin (ZOCOR) 20 MG tablet TAKE 1 TABLET BY MOUTH DAILY 90 tablet 1  . traMADol (ULTRAM) 50 MG tablet Take 50 mg by mouth 3 (three) times daily as needed.    . triamcinolone cream (KENALOG) 0.1 % APPLY TOPICALLY TO THE AFFECTED AREA TWICE DAILY AS NEEDED FOR SPOTS ON YOUR SKIN 45 g 1   No current facility-administered medications on file prior to visit.     Review of Systems:  As per HPI- otherwise negative. No fever or chills No Cp or SOB  Physical Examination: Vitals:   05/02/19 1514  BP: 110/70  Pulse: 72  Resp: 16  Temp: 97.9 F (36.6 C)  SpO2: 96%   Vitals:   05/02/19 1514  Weight: 164 lb (74.4 kg)  Height: 5\' 6"  (1.676 m)   Body mass index is 26.47 kg/m. Ideal Body Weight: Weight in (lb) to have BMI = 25:  154.6  GEN: WDWN, NAD, Non-toxic, A & O x 3, looks well, minimal overweight  HEENT: Atraumatic, Normocephalic. Neck supple. No masses, No LAD.  TM wnl bilatearlly  Nose appears to have normal circulation, is warm to the touch Ears and Nose: No external deformity. CV: RRR, No M/G/R. No JVD. No thrill. No extra heart sounds. PULM: CTA B, no wheezes, crackles, rhonchi. No retractions. No resp. distress. No accessory muscle use. ABD: S, NT, ND EXTR: No c/c/e NEURO Normal gait.  PSYCH: Normally interactive. Conversant. Not depressed or anxious appearing.  Calm demeanor.  Evidence of recent radiation for  skin cancer on left lower extremity.  This looks like it is doing well  Assessment and Plan:   ICD-10-CM   1. Other polyneuropathy  G62.89 Comprehensive metabolic panel    Hemoglobin A1c  2. Gait instability  R26.81   3. Mild anemia  D64.9 CBC  4. Acquired hypothyroidism  E03.9 TSH  5. Hyperlipidemia, unspecified hyperlipidemia type  E78.5 Lipid panel  6. Elevated glucose  R73.09 Hemoglobin A1c  7. Estrogen deficiency  E28.39 DG Bone Density   Here today for follow-up visit-she is generally doing okay, has a few minor complaints Routine labs pain as above She is doing physical therapy to work on her balance and strength Concerned about thyroid, will check TSH today Doing all is well She continues to have some lower back pain.  Wonders about do another injection.  Advised about half to defer this to Dr. Evelene Croon  Follow-up: No follow-ups on file.  No orders of the defined types were placed in this encounter.  Orders Placed This Encounter  Procedures  . DG Bone Density  . CBC  . Comprehensive metabolic panel  . Hemoglobin A1c  . Lipid panel  . TSH    @SIGN @    Signed Lamar Blinks, MD  Received her labs 8/18, message to patient  Results for orders placed or performed in visit on 05/02/19  CBC  Result Value Ref Range   WBC 5.9 4.0 - 10.5 K/uL   RBC 3.83 (L) 3.87 -  5.11 Mil/uL   Platelets 226.0 150.0 - 400.0 K/uL   Hemoglobin 11.5 (L) 12.0 - 15.0 g/dL   HCT 35.2 (L) 36.0 - 46.0 %   MCV 92.0 78.0 - 100.0 fl   MCHC 32.6 30.0 - 36.0 g/dL   RDW 14.8 11.5 - 15.5 %  Comprehensive metabolic panel  Result Value Ref Range   Sodium 142 135 - 145 mEq/L   Potassium 4.8 3.5 - 5.1 mEq/L   Chloride 105 96 - 112 mEq/L   CO2 31 19 - 32 mEq/L   Glucose, Bld 93 70 - 99 mg/dL   BUN 16 6 - 23 mg/dL   Creatinine, Ser 1.07 0.40 - 1.20 mg/dL   Total Bilirubin 0.3 0.2 - 1.2 mg/dL   Alkaline Phosphatase 73 39 - 117 U/L   AST 15 0 - 37 U/L   ALT 10 0 - 35 U/L   Total Protein 6.4 6.0 - 8.3 g/dL   Albumin 3.7 3.5 - 5.2 g/dL   Calcium 8.7 8.4 - 10.5 mg/dL   GFR 49.26 (L) >60.00 mL/min  Hemoglobin A1c  Result Value Ref Range   Hgb A1c MFr Bld 6.1 4.6 - 6.5 %  Lipid panel  Result Value Ref Range   Cholesterol 175 0 - 200 mg/dL   Triglycerides 202.0 (H) 0.0 - 149.0 mg/dL   HDL 51.50 >39.00 mg/dL   VLDL 40.4 (H) 0.0 - 40.0 mg/dL   Total CHOL/HDL Ratio 3    NonHDL 123.40   TSH  Result Value Ref Range   TSH 0.97 0.35 - 4.50 uIU/mL  LDL cholesterol, direct  Result Value Ref Range   Direct LDL 95.0 mg/dL    Lipids are stable Kidney function decreased from her baseline Mild anemia is stable Colonoscopy last year    Direct LDL 95.0 mg/dL   Your thyroid is in normal range, continue current dose of thyroid medication Cholesterol is stable and reasonable Your A1c (average blood sugar the previous 3 months) shows prediabetes-we will keep an eye  on this  Metabolic profile is normal except your kidney filtration rate or GFR is slightly lower than your baseline.  Let us recheck this in a few months  Your blood counts continue to show minimal anemia.  This should not be enough to cause any symptoms  Let us repeat a kidney function and blood count in 3 months.  I can order these tests as a lab visit only if you like.  Let us visit in the office together in 6 months

## 2019-05-02 ENCOUNTER — Encounter: Payer: Self-pay | Admitting: Physical Therapy

## 2019-05-02 ENCOUNTER — Other Ambulatory Visit: Payer: Self-pay

## 2019-05-02 ENCOUNTER — Ambulatory Visit (INDEPENDENT_AMBULATORY_CARE_PROVIDER_SITE_OTHER): Payer: Medicare Other | Admitting: Family Medicine

## 2019-05-02 ENCOUNTER — Encounter: Payer: Self-pay | Admitting: Family Medicine

## 2019-05-02 ENCOUNTER — Ambulatory Visit: Payer: Medicare Other | Admitting: Physical Therapy

## 2019-05-02 VITALS — BP 110/70 | HR 72 | Temp 97.9°F | Resp 16 | Ht 66.0 in | Wt 164.0 lb

## 2019-05-02 DIAGNOSIS — E039 Hypothyroidism, unspecified: Secondary | ICD-10-CM | POA: Diagnosis not present

## 2019-05-02 DIAGNOSIS — G6289 Other specified polyneuropathies: Secondary | ICD-10-CM | POA: Diagnosis not present

## 2019-05-02 DIAGNOSIS — E785 Hyperlipidemia, unspecified: Secondary | ICD-10-CM | POA: Diagnosis not present

## 2019-05-02 DIAGNOSIS — R2689 Other abnormalities of gait and mobility: Secondary | ICD-10-CM | POA: Diagnosis not present

## 2019-05-02 DIAGNOSIS — D649 Anemia, unspecified: Secondary | ICD-10-CM

## 2019-05-02 DIAGNOSIS — R2681 Unsteadiness on feet: Secondary | ICD-10-CM

## 2019-05-02 DIAGNOSIS — E2839 Other primary ovarian failure: Secondary | ICD-10-CM | POA: Diagnosis not present

## 2019-05-02 DIAGNOSIS — R944 Abnormal results of kidney function studies: Secondary | ICD-10-CM | POA: Diagnosis not present

## 2019-05-02 DIAGNOSIS — R293 Abnormal posture: Secondary | ICD-10-CM | POA: Diagnosis not present

## 2019-05-02 DIAGNOSIS — R7309 Other abnormal glucose: Secondary | ICD-10-CM

## 2019-05-02 NOTE — Patient Instructions (Addendum)
It was great to see you today - I will be in touch with your labs asap We will check your thyroid level for you today No shame in taking a nap!  An after lunch nap can be refreshing and energizing- you might try napping for a couple of weeks and see if you feel less tired  Take care, let's plan to visit in 6 months assuming all is well

## 2019-05-02 NOTE — Therapy (Signed)
[facility] [address]  [place], [place], [address] Phone: [phone]   Fax:  [phone]  Physical Therapy Treatment  Patient Details  Name: Mary Buck MRN: [id number] Date of Birth: 01-30-1936 Referring Provider (PT): Lamar Blinks, MD   Encounter Date: 05/02/2019  PT End of Session - 05/02/19 9371    Visit Number  3    Number of Visits  7    Date for PT Re-Evaluation  05/25/19    Authorization Type  Medicare & [employer]    PT Start Time  1319    PT Stop Time  1400    PT Time Calculation (min)  41 min    Equipment Utilized During Treatment  Gait belt    Activity Tolerance  Patient tolerated treatment well    Behavior During Therapy  [facility] for tasks assessed/performed       Past Medical History:  Diagnosis Date  . Allergy   . Asthma   . Cancer ([place])   . Cataract   . DDD (degenerative disc disease), lumbar 06/01/2017  . Depression   . Heart murmur   . Hyperlipidemia   . Peripheral neuropathy 04/30/2017  . Thyroid disease     Past Surgical History:  Procedure Laterality Date  . bladder cancer  2013  . CARPAL TUNNEL RELEASE  2015  . CATARACT EXTRACTION Bilateral 2012  . CHOLECYSTECTOMY    . SALIVARY GLAND SURGERY Left 2015   benign  . TONSILLECTOMY AND ADENOIDECTOMY  1943    There were no vitals filed for this visit.  Subjective Assessment - 05/02/19 1322    Subjective  Reports that her L LE pain is a bit better. Denies falls since last session. Has been using the treadmill for exercise.    Pertinent History  thyroid disease, peripheral neuropathy, HLD, depression, lumbar DDD, hx skin CA with radiation, asthma, carpal tunnel release    Patient Stated Goals  "would like to walk in a straight line and without feeling exhausted"    Currently in Pain?  Yes    Pain Score  5     Pain Location  Leg    Pain Orientation  Right;Left    Pain Descriptors / Indicators  --   fatigue   Pain Type  Acute  pain                [facility] Adult PT Treatment/Exercise - 05/02/19 0001      Neuro Re-ed    Neuro Re-ed Details   tandem walk with light 1 UE support on counter 4x length of counter, backwards walking without UE support 4x length of counter;       Knee/Hip Exercises: Stretches   Press photographer  Right;Left;2 reps;30 seconds    Gastroc Stretch Limitations  at counter top      Knee/Hip Exercises: Aerobic   Recumbent Bike  L1 x 3.5 min      Knee/Hip Exercises: Standing   SLS  R/L SLS ring toss game 2x through     Gait Training  walking with head turns up/down and R/L 2x58ft   mild weaving   Other Standing Knee Exercises  R /L anterior/posterior stepping with head turns up/down with gaze stabilization x10 each LE   moderate instability and CGA   Other Standing Knee Exercises  4 square step activity CW/CCW x10 each side   most difficulty with lateral stepping     Knee/Hip Exercises: Seated   Sit to [employer]  1 set;10 reps;without UE support   on foam + 30 sec balance with perturbations on last rep            PT Education - 05/02/19 1400    Education Details  update to HEP    Person(s) Educated  Patient    Methods  Explanation;Demonstration;Tactile cues;Verbal cues;Handout    Comprehension  Verbalized understanding;Returned demonstration       PT Short Term Goals - 05/02/19 1648      PT SHORT TERM GOAL #1   Title  Patient to be independent with initial HEP.    Status  Achieved        PT Long Term Goals - 04/25/19 1848      PT LONG TERM GOAL #1   Title  Patient to be independent with advanced HEP.    Time  6    Period  Weeks    Status  On-going      PT LONG TERM GOAL #2   Title  Patient to demonstrate 10 degrees of ankle dorsiflexion AROM.    Time  6    Period  Weeks    Status  On-going      PT LONG TERM GOAL #3   Title  Patient to demonstrate mild-moderate sway with M-CTSIB condition EC/foam surface in order to improve balance on compliant surfaces and  in dim lighting.    Time  6    Period  Weeks    Status  On-going      PT LONG TERM GOAL #4   Title  Patient to report 50% improvement in feeling of imbalance with STS transfers.    Time  6    Period  Weeks    Status  On-going            Plan - 05/02/19 1644    Clinical Impression Statement  Patient arrived to session with report of mild improvement in L LE pain. Patient with good carryover of calf stretching at beginning of session. Worked on progressive dynamic balance exercises with intermittent UE support for correction of balance. Patient requiring simple cues to widen BOS with backwards walking and foot placement with tandem walk, but overall with good capability and infrequent UE support with these activities. Patient did struggle with walking with head turns, thus worked on stepping with head turns. Patient requiring more assistance with this activity and reporting mild onset of dizziness with looking up which quickly dissipated. Patient reported understanding of HEP update and with no further complaints at end of session.    Comorbidities  thyroid disease, peripheral neuropathy, HLD, depression, lumbar DDD, hx skin CA with radiation, asthma, carpal tunnel release    PT Treatment/Interventions  ADLs/Self Care Home Management;Cryotherapy;Moist Heat;Balance training;Therapeutic exercise;Therapeutic activities;Functional mobility training;Stair training;Gait training;Neuromuscular re-education;Patient/family education;Manual techniques;Energy conservation;Passive range of motion    PT Next Visit Plan  progress balance with EC/ foam    Consulted and Agree with Plan of Care  Patient       Patient will benefit from skilled therapeutic intervention in order to improve the following deficits and impairments:  Abnormal gait, Decreased endurance, Decreased activity tolerance, Pain, Decreased balance, Difficulty walking, Improper body mechanics, Decreased range of motion, Impaired flexibility,  Postural dysfunction  Visit Diagnosis: 1. Unsteadiness on feet   2. Other abnormalities of gait and mobility   3. Abnormal posture        Problem List Patient Active Problem List   Diagnosis Date Noted  . Pedal edema 01/31/2018  .  Cellulitis 01/31/2018  . Urinary hesitancy 01/31/2018  . Squamous cell carcinoma of skin of left lower limb, including hip 12/16/2017  . Plantar fasciitis 07/07/2017  . DDD (degenerative disc disease), lumbar 06/01/2017  . Fibromyalgia 06/01/2017  . Spinal stenosis at L4-L5 level 04/30/2017  . Peripheral neuropathy 04/30/2017  . Encounter for medication monitoring 03/05/2017  . Bladder cancer ([place]) 08/20/2015  . Cervical stenosis of spinal canal 07/27/2015  . Spinal stenosis of cervical region 07/27/2015  . Spondylolisthesis 07/27/2015  . Hypothyroid 07/05/2015  . Abdominal pain, acute, left upper quadrant 06/28/2015  . Basal cell carcinoma, face 06/28/2015  . Bilateral groin pain 06/28/2015  . Chronic pain 06/28/2015  . Fatigue 06/28/2015  . Hyperlipidemia 06/28/2015  . Pain in joints 06/28/2015  . Paresthesia of lower lip 06/28/2015  . Pulmonary nodule 06/28/2015  . Pustulosis palmaris et plantaris 06/28/2015  . Ulcer of nose 06/28/2015  . 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  . Mass of parotid gland 03/24/2014     Janene Harvey, PT, DPT 05/02/19 4:50 PM   [facility] [address]  [place], [place], [address] Phone: [phone]   Fax:  [phone]  Name: Mary Buck MRN: [id number] Date of Birth: [birth year]

## 2019-05-03 ENCOUNTER — Encounter: Payer: Self-pay | Admitting: Family Medicine

## 2019-05-03 LAB — LIPID PANEL
Cholesterol: 175 mg/dL (ref 0–200)
HDL: 51.5 mg/dL (ref >39.00)
NonHDL: 123.4
Total CHOL/HDL Ratio: 3
Triglycerides: 202 mg/dL — ABNORMAL HIGH (ref 0.0–149.0)
VLDL: 40.4 mg/dL — ABNORMAL HIGH (ref 0.0–40.0)

## 2019-05-03 LAB — COMPREHENSIVE METABOLIC PANEL
ALT: 10 U/L (ref 0–35)
AST: 15 U/L (ref 0–37)
Albumin: 3.7 g/dL (ref 3.5–5.2)
Alkaline Phosphatase: 73 U/L (ref 39–117)
BUN: 16 mg/dL (ref 6–23)
CO2: 31 mEq/L (ref 19–32)
Calcium: 8.7 mg/dL (ref 8.4–10.5)
Chloride: 105 mEq/L (ref 96–112)
Creatinine, Ser: 1.07 mg/dL (ref 0.40–1.20)
GFR: 49.26 mL/min — ABNORMAL LOW (ref >60.00)
Glucose, Bld: 93 mg/dL (ref 70–99)
Potassium: 4.8 mEq/L (ref 3.5–5.1)
Sodium: 142 mEq/L (ref 135–145)
Total Bilirubin: 0.3 mg/dL (ref 0.2–1.2)
Total Protein: 6.4 g/dL (ref 6.0–8.3)

## 2019-05-03 LAB — CBC
HCT: 35.2 % — ABNORMAL LOW (ref 36.0–46.0)
Hemoglobin: 11.5 g/dL — ABNORMAL LOW (ref 12.0–15.0)
MCHC: 32.6 g/dL (ref 30.0–36.0)
MCV: 92 fl (ref 78.0–100.0)
Platelets: 226 10*3/uL (ref 150.0–400.0)
RBC: 3.83 Mil/uL — ABNORMAL LOW (ref 3.87–5.11)
RDW: 14.8 % (ref 11.5–15.5)
WBC: 5.9 10*3/uL (ref 4.0–10.5)

## 2019-05-03 LAB — TSH: TSH: 0.97 u[IU]/mL (ref 0.35–4.50)

## 2019-05-03 LAB — LDL CHOLESTEROL, DIRECT: Direct LDL: 95 mg/dL

## 2019-05-03 LAB — HEMOGLOBIN A1C: Hgb A1c MFr Bld: 6.1 % (ref 4.6–6.5)

## 2019-05-03 NOTE — Addendum Note (Signed)
Addended by: Lamar Blinks C on: 05/03/2019 03:32 PM   Modules accepted: Orders

## 2019-05-04 DIAGNOSIS — Z85828 Personal history of other malignant neoplasm of skin: Secondary | ICD-10-CM | POA: Diagnosis not present

## 2019-05-04 DIAGNOSIS — L720 Epidermal cyst: Secondary | ICD-10-CM | POA: Diagnosis not present

## 2019-05-04 DIAGNOSIS — L400 Psoriasis vulgaris: Secondary | ICD-10-CM | POA: Diagnosis not present

## 2019-05-04 DIAGNOSIS — D485 Neoplasm of uncertain behavior of skin: Secondary | ICD-10-CM | POA: Diagnosis not present

## 2019-05-04 DIAGNOSIS — C44722 Squamous cell carcinoma of skin of right lower limb, including hip: Secondary | ICD-10-CM | POA: Diagnosis not present

## 2019-05-09 ENCOUNTER — Other Ambulatory Visit: Payer: Self-pay

## 2019-05-09 ENCOUNTER — Ambulatory Visit: Payer: Medicare Other | Admitting: Physical Therapy

## 2019-05-09 ENCOUNTER — Encounter: Payer: Self-pay | Admitting: Physical Therapy

## 2019-05-09 DIAGNOSIS — R2681 Unsteadiness on feet: Secondary | ICD-10-CM

## 2019-05-09 DIAGNOSIS — R293 Abnormal posture: Secondary | ICD-10-CM | POA: Diagnosis not present

## 2019-05-09 DIAGNOSIS — R2689 Other abnormalities of gait and mobility: Secondary | ICD-10-CM

## 2019-05-09 NOTE — Therapy (Signed)
[facility] [address]  [place], [place], [address] Phone: [phone]   Fax:  [phone]  Physical Therapy Treatment  Patient Details  Name: Mary Buck MRN: [id number] Date of Birth: [birth year] Referring Provider (PT): Lamar Blinks, MD    Encounter Date: 05/09/2019  PT End of Session - 05/09/19 1543    Visit Number  4    Number of Visits  7    Date for PT Re-Evaluation  05/25/19    Authorization Type  Medicare & [employer]    PT Start Time  1315    PT Stop Time  1400    PT Time Calculation (min)  45 min    Equipment Utilized During Treatment  Gait belt    Activity Tolerance  Patient tolerated treatment well    Behavior During Therapy  [facility] for tasks assessed/performed       Past Medical History:  Diagnosis Date  . Allergy   . Asthma   . Cancer ([place])   . Cataract   . DDD (degenerative disc disease), lumbar 06/01/2017  . Depression   . Heart murmur   . Hyperlipidemia   . Peripheral neuropathy 04/30/2017  . Thyroid disease     Past Surgical History:  Procedure Laterality Date  . bladder cancer  2013  . CARPAL TUNNEL RELEASE  2015  . CATARACT EXTRACTION Bilateral 2012  . CHOLECYSTECTOMY    . SALIVARY GLAND SURGERY Left 2015   benign  . TONSILLECTOMY AND ADENOIDECTOMY  1943    There were no vitals filed for this visit.  Subjective Assessment - 05/09/19 1316    Subjective  Reports that she is doing okay. Still having pain in her L leg.    Pertinent History  thyroid disease, peripheral neuropathy, HLD, depression, lumbar DDD, hx skin CA with radiation, asthma, carpal tunnel release    Patient Stated Goals  "would like to walk in a straight line and without feeling exhausted"    Currently in Pain?  Yes    Pain Score  7     Pain Location  --   thigh   Pain Orientation  Left;Anterior    Pain Descriptors / Indicators  Burning    Pain Type  Acute pain                  [facility] Adult PT  Treatment/Exercise - 05/09/19 0001      Ambulation/Gait   Ambulation Distance (Feet)  300 Feet    Assistive device  None    Gait Pattern  Step-through pattern;Decreased dorsiflexion - right;Decreased dorsiflexion - left;Lateral hip instability;Trunk flexed    Ambulation Surface  Level;Indoor    Gait velocity  decreased    Stairs  Yes    Stairs Assistance  5: Supervision    Stair Management Technique  One rail Right    Number of Stairs  13    Height of Stairs  8    Gait Comments  walking up/down curbs, gait on grass, tiles/stones, sidewalk; reciprocal stair climbing up/down with 1 handrail    cues required for gaze stabilization to avoid path deviation     Neuro Re-ed    Neuro Re-ed Details   heel walk/toe walk with intermittent UE support on TM rail      Knee/Hip Exercises: Aerobic   Nustep  L2 x 29min (LEs only)      Knee/Hip Exercises: Standing   Gait Training  romberg stance + cervical flexion/extension and head  turns R/L at TM nail x10    Other Standing Knee Exercises  R /L anterior/posterior stepping with head turns up/down with gaze stabilization x10 each LE   intermittent min A required; c/o dizziness with cervical ex   Other Standing Knee Exercises  marching on foam 3x20   cues for foot positioning; intermittent min A required     Knee/Hip Exercises: Seated   Sit to Sand  1 set;10 reps;without UE support   cues for upright trunk            PT Education - 05/09/19 1400    Education Details  update to HEP to be performed at counter top for safety    Person(s) Educated  Patient    Methods  Explanation;Demonstration;Tactile cues;Verbal cues;Handout    Comprehension  Verbalized understanding;Returned demonstration       PT Short Term Goals - 05/02/19 1648      PT SHORT TERM GOAL #1   Title  Patient to be independent with initial HEP.    Status  Achieved        PT Long Term Goals - 04/25/19 1848      PT LONG TERM GOAL #1   Title  Patient to be independent  with advanced HEP.    Time  6    Period  Weeks    Status  On-going      PT LONG TERM GOAL #2   Title  Patient to demonstrate 10 degrees of ankle dorsiflexion AROM.    Time  6    Period  Weeks    Status  On-going      PT LONG TERM GOAL #3   Title  Patient to demonstrate mild-moderate sway with M-CTSIB condition EC/foam surface in order to improve balance on compliant surfaces and in dim lighting.    Time  6    Period  Weeks    Status  On-going      PT LONG TERM GOAL #4   Title  Patient to report 50% improvement in feeling of imbalance with STS transfers.    Time  6    Period  Weeks    Status  On-going            Plan - 05/09/19 1544    Clinical Impression Statement  Patient arrived to session with no new complaints. Worked on gait training outside up/down curbs, on grass, tiles, and sidewalks to challenge patient on different surfaces. Patient overall with good ability to use ankle strategy when walking on uneven surface. More challenged when introducing dual task such as a conversation, resulting in path deviation. Educated patient on using visual fixation to help with keeping a straight line with walking, which patient found benefit in. Continued to work on dynamic balance exercises with head turns. Patient still having difficulty and slight onset of dizziness with cervical extension while stepping, however, much safer with Romberg stance with head turns. Updated HEP with exercises that were well-tolerated today. Patient reported understanding and with no complaints at end of session.    Comorbidities  thyroid disease, peripheral neuropathy, HLD, depression, lumbar DDD, hx skin CA with radiation, asthma, carpal tunnel release    PT Treatment/Interventions  ADLs/Self Care Home Management;Cryotherapy;Moist Heat;Balance training;Therapeutic exercise;Therapeutic activities;Functional mobility training;Stair training;Gait training;Neuromuscular re-education;Patient/family  education;Manual techniques;Energy conservation;Passive range of motion    PT Next Visit Plan  progress balance with EC/ foam    Consulted and Agree with Plan of Care  Patient  Patient will benefit from skilled therapeutic intervention in order to improve the following deficits and impairments:  Abnormal gait, Decreased endurance, Decreased activity tolerance, Pain, Decreased balance, Difficulty walking, Improper body mechanics, Decreased range of motion, Impaired flexibility, Postural dysfunction  Visit Diagnosis: Unsteadiness on feet  Other abnormalities of gait and mobility  Abnormal posture     Problem List Patient Active Problem List   Diagnosis Date Noted  . Pedal edema 01/31/2018  . Cellulitis 01/31/2018  . Urinary hesitancy 01/31/2018  . Squamous cell carcinoma of skin of left lower limb, including hip 12/16/2017  . Plantar fasciitis 07/07/2017  . DDD (degenerative disc disease), lumbar 06/01/2017  . Fibromyalgia 06/01/2017  . Spinal stenosis at L4-L5 level 04/30/2017  . Peripheral neuropathy 04/30/2017  . Encounter for medication monitoring 03/05/2017  . Bladder cancer ([place]) 08/20/2015  . Cervical stenosis of spinal canal 07/27/2015  . Spinal stenosis of cervical region 07/27/2015  . Spondylolisthesis 07/27/2015  . Hypothyroid 07/05/2015  . Abdominal pain, acute, left upper quadrant 06/28/2015  . Basal cell carcinoma, face 06/28/2015  . Bilateral groin pain 06/28/2015  . Chronic pain 06/28/2015  . Fatigue 06/28/2015  . Hyperlipidemia 06/28/2015  . Pain in joints 06/28/2015  . Paresthesia of lower lip 06/28/2015  . Pulmonary nodule 06/28/2015  . Pustulosis palmaris et plantaris 06/28/2015  . Ulcer of nose 06/28/2015  . 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  . Mass of parotid gland 03/24/2014    Janene Harvey, PT, DPT 05/09/19 3:50 PM   [facility] [address]  [place], [place],  [address] Phone: [phone]   Fax:  [phone]  Name: Mary Buck MRN: [id number] Date of Birth: 02/25/1936

## 2019-05-16 ENCOUNTER — Encounter: Payer: Self-pay | Admitting: Physical Therapy

## 2019-05-16 ENCOUNTER — Ambulatory Visit: Payer: Medicare Other | Admitting: Physical Therapy

## 2019-05-16 ENCOUNTER — Other Ambulatory Visit: Payer: Self-pay

## 2019-05-16 DIAGNOSIS — R2689 Other abnormalities of gait and mobility: Secondary | ICD-10-CM

## 2019-05-16 DIAGNOSIS — R2681 Unsteadiness on feet: Secondary | ICD-10-CM | POA: Diagnosis not present

## 2019-05-16 DIAGNOSIS — R293 Abnormal posture: Secondary | ICD-10-CM

## 2019-05-16 NOTE — Therapy (Signed)
[facility] [address]  [place], [place], [address] Phone: [phone]   Fax:  [phone]  Physical Therapy Treatment  Patient Details  Name: Mary Buck MRN: [id number] Date of Birth: 22-Sep-1935 Referring Provider (PT): Lamar Blinks, MD   Encounter Date: 05/16/2019  PT End of Session - 05/16/19 1402    Visit Number  5    Number of Visits  7    Date for PT Re-Evaluation  05/25/19    Authorization Type  Medicare & [employer]    PT Start Time  1319    PT Stop Time  1359    PT Time Calculation (min)  40 min    Equipment Utilized During Treatment  Gait belt    Activity Tolerance  Patient tolerated treatment well    Behavior During Therapy  [facility] for tasks assessed/performed       Past Medical History:  Diagnosis Date  . Allergy   . Asthma   . Cancer ([place])   . Cataract   . DDD (degenerative disc disease), lumbar 06/01/2017  . Depression   . Heart murmur   . Hyperlipidemia   . Peripheral neuropathy 04/30/2017  . Thyroid disease     Past Surgical History:  Procedure Laterality Date  . bladder cancer  2013  . CARPAL TUNNEL RELEASE  2015  . CATARACT EXTRACTION Bilateral 2012  . CHOLECYSTECTOMY    . SALIVARY GLAND SURGERY Left 2015   benign  . TONSILLECTOMY AND ADENOIDECTOMY  1943    There were no vitals filed for this visit.  Subjective Assessment - 05/16/19 1320    Subjective  Reports that she has been so-so. Partly d/t back pain.    Pertinent History  thyroid disease, peripheral neuropathy, HLD, depression, lumbar DDD, hx skin CA with radiation, asthma, carpal tunnel release    Patient Stated Goals  "would like to walk in a straight line and without feeling exhausted"    Currently in Pain?  Yes    Pain Score  5     Pain Location  Back    Pain Orientation  Left    Pain Descriptors / Indicators  Aching    Pain Type  Chronic pain                 [facility] Adult PT Treatment/Exercise -  05/16/19 0001      Neuro Re-ed    Neuro Re-ed Details   crossing midline reach on foam with cone touch x10 each side; 1 foot on foam + horizontal VOR x5 each foot; with vertical VOR x5 each foot; 4 square step to cone colors with CGA;    more difficulty on L     Knee/Hip Exercises: Aerobic   Recumbent Bike  L1 x 5 min      Knee/Hip Exercises: Standing   Functional Squat  1 set;10 reps    Functional Squat Limitations  mini squat on foam + head turns   cues to perform more shallow squat   Other Standing Knee Exercises  romberg stance + ball throw/bounce              PT Education - 05/16/19 1402    Education Details  update to HEP    Person(s) Educated  Patient    Methods  Explanation;Demonstration;Tactile cues;Verbal cues;Handout    Comprehension  Verbalized understanding;Returned demonstration       PT Short Term Goals - 05/02/19 1648      PT SHORT  TERM GOAL #1   Title  Patient to be independent with initial HEP.    Status  Achieved        PT Long Term Goals - 04/25/19 1848      PT LONG TERM GOAL #1   Title  Patient to be independent with advanced HEP.    Time  6    Period  Weeks    Status  On-going      PT LONG TERM GOAL #2   Title  Patient to demonstrate 10 degrees of ankle dorsiflexion AROM.    Time  6    Period  Weeks    Status  On-going      PT LONG TERM GOAL #3   Title  Patient to demonstrate mild-moderate sway with M-CTSIB condition EC/foam surface in order to improve balance on compliant surfaces and in dim lighting.    Time  6    Period  Weeks    Status  On-going      PT LONG TERM GOAL #4   Title  Patient to report 50% improvement in feeling of imbalance with STS transfers.    Time  6    Period  Weeks    Status  On-going            Plan - 05/16/19 1403    Clinical Impression Statement  Patient reporting no falls since last session. Worked on dynamic balance exercises on foam to challenge proprioception. Patient demonstrating more  difficulty when stabilizing on L LE and with tendency to lose balance/sway to L side. Reported mild dizziness with head turns today, possibly suggesting vestibular involvement. Demonstrated excellent ability to move within limits of stability with Romberg ball toss/catch. Updated HEP with tandem stance- patient reported understanding. Patient without complaints at end of session.    Comorbidities  thyroid disease, peripheral neuropathy, HLD, depression, lumbar DDD, hx skin CA with radiation, asthma, carpal tunnel release    PT Treatment/Interventions  ADLs/Self Care Home Management;Cryotherapy;Moist Heat;Balance training;Therapeutic exercise;Therapeutic activities;Functional mobility training;Stair training;Gait training;Neuromuscular re-education;Patient/family education;Manual techniques;Energy conservation;Passive range of motion    PT Next Visit Plan  progress balance with EC/ foam    Consulted and Agree with Plan of Care  Patient       Patient will benefit from skilled therapeutic intervention in order to improve the following deficits and impairments:  Abnormal gait, Decreased endurance, Decreased activity tolerance, Pain, Decreased balance, Difficulty walking, Improper body mechanics, Decreased range of motion, Impaired flexibility, Postural dysfunction  Visit Diagnosis: Unsteadiness on feet  Other abnormalities of gait and mobility  Abnormal posture     Problem List Patient Active Problem List   Diagnosis Date Noted  . Pedal edema 01/31/2018  . Cellulitis 01/31/2018  . Urinary hesitancy 01/31/2018  . Squamous cell carcinoma of skin of left lower limb, including hip 12/16/2017  . Plantar fasciitis 07/07/2017  . DDD (degenerative disc disease), lumbar 06/01/2017  . Fibromyalgia 06/01/2017  . Spinal stenosis at L4-L5 level 04/30/2017  . Peripheral neuropathy 04/30/2017  . Encounter for medication monitoring 03/05/2017  . Bladder cancer ([place]) 08/20/2015  . Cervical stenosis of  spinal canal 07/27/2015  . Spinal stenosis of cervical region 07/27/2015  . Spondylolisthesis 07/27/2015  . Hypothyroid 07/05/2015  . Abdominal pain, acute, left upper quadrant 06/28/2015  . Basal cell carcinoma, face 06/28/2015  . Bilateral groin pain 06/28/2015  . Chronic pain 06/28/2015  . Fatigue 06/28/2015  . Hyperlipidemia 06/28/2015  . Pain in joints 06/28/2015  . Paresthesia of lower  lip 06/28/2015  . Pulmonary nodule 06/28/2015  . Pustulosis palmaris et plantaris 06/28/2015  . Ulcer of nose 06/28/2015  . 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  . Mass of parotid gland 03/24/2014    Janene Harvey, PT, DPT 05/16/19 2:05 PM   [facility] [address]  [place], [place], [address] Phone: [phone]   Fax:  [phone]  Name: Mary Buck MRN: [id number] Date of Birth: 1936-07-11

## 2019-05-17 DIAGNOSIS — M48062 Spinal stenosis, lumbar region with neurogenic claudication: Secondary | ICD-10-CM | POA: Diagnosis not present

## 2019-05-17 DIAGNOSIS — M5136 Other intervertebral disc degeneration, lumbar region: Secondary | ICD-10-CM | POA: Diagnosis not present

## 2019-05-17 DIAGNOSIS — Z5181 Encounter for therapeutic drug level monitoring: Secondary | ICD-10-CM | POA: Diagnosis not present

## 2019-05-17 DIAGNOSIS — M797 Fibromyalgia: Secondary | ICD-10-CM | POA: Diagnosis not present

## 2019-05-17 DIAGNOSIS — Z79891 Long term (current) use of opiate analgesic: Secondary | ICD-10-CM | POA: Diagnosis not present

## 2019-05-17 DIAGNOSIS — G894 Chronic pain syndrome: Secondary | ICD-10-CM | POA: Diagnosis not present

## 2019-05-24 ENCOUNTER — Encounter: Payer: Self-pay | Admitting: Physical Therapy

## 2019-05-24 ENCOUNTER — Ambulatory Visit: Payer: Medicare Other | Attending: Family Medicine | Admitting: Physical Therapy

## 2019-05-24 ENCOUNTER — Other Ambulatory Visit: Payer: Self-pay

## 2019-05-24 DIAGNOSIS — R293 Abnormal posture: Secondary | ICD-10-CM | POA: Diagnosis not present

## 2019-05-24 DIAGNOSIS — R2689 Other abnormalities of gait and mobility: Secondary | ICD-10-CM

## 2019-05-24 DIAGNOSIS — R2681 Unsteadiness on feet: Secondary | ICD-10-CM | POA: Diagnosis not present

## 2019-05-24 NOTE — Therapy (Addendum)
[facility] [address]  [place], [place], [address] Phone: [phone]   Fax:  [phone]  Physical Therapy Treatment  Patient Details  Name: Mary Buck MRN: [id number] Date of Birth: [birth year] Referring Provider (PT): Lamar Blinks, MD   Progress Note Reporting Period 04/13/19 to 05/24/19  See note below for Objective Data and Assessment of Progress/Goals.     Encounter Date: 05/24/2019  PT End of Session - 05/24/19 1650    Visit Number  6    Number of Visits  7    Date for PT Re-Evaluation  05/25/19    Authorization Type  Medicare & [employer]    PT Start Time  1319    PT Stop Time  1359    PT Time Calculation (min)  40 min    Equipment Utilized During Treatment  Gait belt    Activity Tolerance  Patient tolerated treatment well    Behavior During Therapy  WFL for tasks assessed/performed       Past Medical History:  Diagnosis Date  . Allergy   . Asthma   . Cancer ([place])   . Cataract   . DDD (degenerative disc disease), lumbar 06/01/2017  . Depression   . Heart murmur   . Hyperlipidemia   . Peripheral neuropathy 04/30/2017  . Thyroid disease     Past Surgical History:  Procedure Laterality Date  . bladder cancer  2013  . CARPAL TUNNEL RELEASE  2015  . CATARACT EXTRACTION Bilateral 2012  . CHOLECYSTECTOMY    . SALIVARY GLAND SURGERY Left 2015   benign  . TONSILLECTOMY AND ADENOIDECTOMY  1943    There were no vitals filed for this visit.  Subjective Assessment - 05/24/19 1321    Subjective  Still struggling with pain- back and knee pain. Feels that sometimes her balance is better since starting PT. Feels like she is better able to walk in a straight line with visual fixation, but pain is worse recently d/t knee and LBP.    Pertinent History  thyroid disease, peripheral neuropathy, HLD, depression, lumbar DDD, hx skin CA with radiation, asthma, carpal tunnel release    Patient Stated Goals   "would like to walk in a straight line and without feeling exhausted"    Currently in Pain?  Yes    Pain Score  6     Pain Location  Knee    Pain Orientation  Left    Pain Descriptors / Indicators  Aching    Pain Type  Chronic pain         [facility] PT Assessment - 05/24/19 0001      AROM   Right Ankle Dorsiflexion  14    Left Ankle Dorsiflexion  10      Static Standing Balance   Static Standing - Comment/# of Minutes  M-CTSIB: EO/firm: WNL, EC/firm: mild sway, EO/foam: moderate sway, EC/foam: severe sway with LOB to R requiring max A               [facility] Adult PT Treatment/Exercise - 05/24/19 0001      Neuro Re-ed    Neuro Re-ed Details   romberg stance + vertical head turns x10   mild instability      Knee/Hip Exercises: Stretches   Gastroc Stretch  Right;Left;30 seconds;1 rep    Press photographer Limitations  at counter top      Knee/Hip Exercises: Aerobic   Recumbent Bike  L1 x 5 min  Knee/Hip Exercises: Standing   Heel Raises  Right;Left;1 set;10 reps    Heel Raises Limitations  at counter top; single leg heel raise             PT Education - 05/24/19 1649    Education Details  update/consolidation of HEP; discussion on objective progress with PT, advised patient to contact her MD for F/U for knee and LBP    Person(s) Educated  Patient    Methods  Explanation;Demonstration;Tactile cues;Verbal cues;Handout    Comprehension  Verbalized understanding;Returned demonstration       PT Short Term Goals - 05/24/19 1328      PT SHORT TERM GOAL #1   Title  Patient to be independent with initial HEP.    Status  Achieved        PT Long Term Goals - 05/24/19 1328      PT LONG TERM GOAL #1   Title  Patient to be independent with advanced HEP.    Time  6    Period  Weeks    Status  Partially Met   reporting intermittent compliance- limited by pain and fatigue     PT LONG TERM GOAL #2   Title  Patient to demonstrate 10 degrees of ankle dorsiflexion  AROM.    Time  6    Period  Weeks    Status  Achieved      PT LONG TERM GOAL #3   Title  Patient to demonstrate mild-moderate sway with M-CTSIB condition EC/foam surface in order to improve balance on compliant surfaces and in dim lighting.    Time  6    Period  Weeks    Status  On-going   severe sway with LOB     PT LONG TERM GOAL #4   Title  Patient to report 50% improvement in feeling of imbalance with STS transfers.    Time  6    Period  Weeks    Status  Achieved   reports 60% improvement           Plan - 05/24/19 1655    Clinical Impression Statement  Patient reporting intermittent improvement in balance since starting PT. Feels that she is now better able to walk in a straight line with use of visual fixation. Recently, notes that she has been more off balance since dealing with knee and back pain. B ankle AROM has improved, patient now has met this goal. Was able to tolerate M-CTSIB condition EC/foam surface, but with LOB, requiring assistance for recovery. All other conditions of this test were improved, however. Patient also reported 60% improvement in her confidence when performing STS. In fact, patient now is able to perform this exercise without UE assistance, whereas she was quite reliant of UE support before PT. Updated HEP and spoke with patient about contacting her MD about knee and LBP as these are limiting factors for patient's balance. Patient agreeable and ready to wrap up with PT. Patient is on 30 day hold at this time d/t satisfaction with CLOF.    Comorbidities  thyroid disease, peripheral neuropathy, HLD, depression, lumbar DDD, hx skin CA with radiation, asthma, carpal tunnel release    PT Treatment/Interventions  ADLs/Self Care Home Management;Cryotherapy;Moist Heat;Balance training;Therapeutic exercise;Therapeutic activities;Functional mobility training;Stair training;Gait training;Neuromuscular re-education;Patient/family education;Manual techniques;Energy  conservation;Passive range of motion    PT Next Visit Plan  30 day hold at this time    Consulted and Agree with Plan of Care  Patient  Patient will benefit from skilled therapeutic intervention in order to improve the following deficits and impairments:  Abnormal gait, Decreased endurance, Decreased activity tolerance, Pain, Decreased balance, Difficulty walking, Improper body mechanics, Decreased range of motion, Impaired flexibility, Postural dysfunction  Visit Diagnosis: Unsteadiness on feet  Other abnormalities of gait and mobility  Abnormal posture     Problem List Patient Active Problem List   Diagnosis Date Noted  . Pedal edema 01/31/2018  . Cellulitis 01/31/2018  . Urinary hesitancy 01/31/2018  . Squamous cell carcinoma of skin of left lower limb, including hip 12/16/2017  . Plantar fasciitis 07/07/2017  . DDD (degenerative disc disease), lumbar 06/01/2017  . Fibromyalgia 06/01/2017  . Spinal stenosis at L4-L5 level 04/30/2017  . Peripheral neuropathy 04/30/2017  . Encounter for medication monitoring 03/05/2017  . Bladder cancer ([place]) 08/20/2015  . Cervical stenosis of spinal canal 07/27/2015  . Spinal stenosis of cervical region 07/27/2015  . Spondylolisthesis 07/27/2015  . Hypothyroid 07/05/2015  . Abdominal pain, acute, left upper quadrant 06/28/2015  . Basal cell carcinoma, face 06/28/2015  . Bilateral groin pain 06/28/2015  . Chronic pain 06/28/2015  . Fatigue 06/28/2015  . Hyperlipidemia 06/28/2015  . Pain in joints 06/28/2015  . Paresthesia of lower lip 06/28/2015  . Pulmonary nodule 06/28/2015  . Pustulosis palmaris et plantaris 06/28/2015  . Ulcer of nose 06/28/2015  . 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  . Mass of parotid gland 03/24/2014     Janene Harvey, PT, DPT 05/24/19 4:57 PM   [facility] [address]  [place], [place], [address] Phone: [phone]   Fax:   [phone]  Name: Mary Buck MRN: [id number] Date of Birth: May 14, 1936  PHYSICAL THERAPY DISCHARGE SUMMARY  Visits from Start of Care: 6  Current functional level related to goals / functional outcomes: See above clinical impression; patient pleased with CLOF since 30 day hold    Remaining deficits: Imbalance with M-CTSIB   Education / Equipment: HEP  Plan: Patient agrees to discharge.  Patient goals were partially met. Patient is being discharged due to being pleased with the current functional level.  ?????     Janene Harvey, PT, DPT  07/13/19 1:29 PM

## 2019-05-30 ENCOUNTER — Other Ambulatory Visit: Payer: Self-pay

## 2019-05-30 ENCOUNTER — Ambulatory Visit (HOSPITAL_BASED_OUTPATIENT_CLINIC_OR_DEPARTMENT_OTHER)
Admission: RE | Admit: 2019-05-30 | Discharge: 2019-05-30 | Disposition: A | Discharge status: Home / Self Care | Payer: Medicare Other | Source: Ambulatory Visit | Attending: Family Medicine | Admitting: Family Medicine

## 2019-05-30 ENCOUNTER — Encounter: Payer: Self-pay | Admitting: Family Medicine

## 2019-05-30 DIAGNOSIS — E2839 Other primary ovarian failure: Secondary | ICD-10-CM | POA: Insufficient documentation

## 2019-05-30 DIAGNOSIS — Z780 Asymptomatic menopausal state: Secondary | ICD-10-CM | POA: Diagnosis not present

## 2019-05-30 DIAGNOSIS — Z1231 Encounter for screening mammogram for malignant neoplasm of breast: Secondary | ICD-10-CM | POA: Insufficient documentation

## 2019-05-30 DIAGNOSIS — M85851 Other specified disorders of bone density and structure, right thigh: Secondary | ICD-10-CM | POA: Diagnosis not present

## 2019-05-30 DIAGNOSIS — M8580 Other specified disorders of bone density and structure, unspecified site: Secondary | ICD-10-CM | POA: Insufficient documentation

## 2019-05-31 ENCOUNTER — Encounter: Payer: Medicare Other | Admitting: Physical Therapy

## 2019-06-01 DIAGNOSIS — H0288B Meibomian gland dysfunction left eye, upper and lower eyelids: Secondary | ICD-10-CM | POA: Diagnosis not present

## 2019-06-01 DIAGNOSIS — H0288A Meibomian gland dysfunction right eye, upper and lower eyelids: Secondary | ICD-10-CM | POA: Diagnosis not present

## 2019-06-01 DIAGNOSIS — H0100B Unspecified blepharitis left eye, upper and lower eyelids: Secondary | ICD-10-CM | POA: Diagnosis not present

## 2019-06-01 DIAGNOSIS — L400 Psoriasis vulgaris: Secondary | ICD-10-CM | POA: Diagnosis not present

## 2019-06-01 DIAGNOSIS — H00022 Hordeolum internum right lower eyelid: Secondary | ICD-10-CM | POA: Diagnosis not present

## 2019-06-01 DIAGNOSIS — C44722 Squamous cell carcinoma of skin of right lower limb, including hip: Secondary | ICD-10-CM | POA: Diagnosis not present

## 2019-06-01 DIAGNOSIS — D485 Neoplasm of uncertain behavior of skin: Secondary | ICD-10-CM | POA: Diagnosis not present

## 2019-06-01 DIAGNOSIS — H0100A Unspecified blepharitis right eye, upper and lower eyelids: Secondary | ICD-10-CM | POA: Diagnosis not present

## 2019-06-01 DIAGNOSIS — Z85828 Personal history of other malignant neoplasm of skin: Secondary | ICD-10-CM | POA: Diagnosis not present

## 2019-06-06 ENCOUNTER — Encounter: Payer: Medicare Other | Admitting: Physical Therapy

## 2019-06-08 DIAGNOSIS — M48062 Spinal stenosis, lumbar region with neurogenic claudication: Secondary | ICD-10-CM | POA: Diagnosis not present

## 2019-06-08 DIAGNOSIS — M5116 Intervertebral disc disorders with radiculopathy, lumbar region: Secondary | ICD-10-CM | POA: Diagnosis not present

## 2019-06-08 DIAGNOSIS — M48061 Spinal stenosis, lumbar region without neurogenic claudication: Secondary | ICD-10-CM | POA: Diagnosis not present

## 2019-06-13 DIAGNOSIS — Z961 Presence of intraocular lens: Secondary | ICD-10-CM | POA: Diagnosis not present

## 2019-06-13 DIAGNOSIS — H04123 Dry eye syndrome of bilateral lacrimal glands: Secondary | ICD-10-CM | POA: Diagnosis not present

## 2019-06-13 DIAGNOSIS — H0288B Meibomian gland dysfunction left eye, upper and lower eyelids: Secondary | ICD-10-CM | POA: Diagnosis not present

## 2019-06-13 DIAGNOSIS — H0100B Unspecified blepharitis left eye, upper and lower eyelids: Secondary | ICD-10-CM | POA: Diagnosis not present

## 2019-06-13 DIAGNOSIS — H0100A Unspecified blepharitis right eye, upper and lower eyelids: Secondary | ICD-10-CM | POA: Diagnosis not present

## 2019-06-13 DIAGNOSIS — H0288A Meibomian gland dysfunction right eye, upper and lower eyelids: Secondary | ICD-10-CM | POA: Diagnosis not present

## 2019-06-13 DIAGNOSIS — H524 Presbyopia: Secondary | ICD-10-CM | POA: Diagnosis not present

## 2019-06-13 DIAGNOSIS — H3589 Other specified retinal disorders: Secondary | ICD-10-CM | POA: Diagnosis not present

## 2019-06-13 DIAGNOSIS — H52203 Unspecified astigmatism, bilateral: Secondary | ICD-10-CM | POA: Diagnosis not present

## 2019-06-13 DIAGNOSIS — H5203 Hypermetropia, bilateral: Secondary | ICD-10-CM | POA: Diagnosis not present

## 2019-06-13 DIAGNOSIS — H43393 Other vitreous opacities, bilateral: Secondary | ICD-10-CM | POA: Diagnosis not present

## 2019-06-15 ENCOUNTER — Other Ambulatory Visit: Payer: Self-pay | Admitting: Family Medicine

## 2019-06-20 ENCOUNTER — Other Ambulatory Visit: Payer: Self-pay | Admitting: Family Medicine

## 2019-06-20 DIAGNOSIS — L409 Psoriasis, unspecified: Secondary | ICD-10-CM

## 2019-06-23 DIAGNOSIS — L400 Psoriasis vulgaris: Secondary | ICD-10-CM | POA: Diagnosis not present

## 2019-06-23 DIAGNOSIS — Z85828 Personal history of other malignant neoplasm of skin: Secondary | ICD-10-CM | POA: Diagnosis not present

## 2019-06-29 DIAGNOSIS — M48062 Spinal stenosis, lumbar region with neurogenic claudication: Secondary | ICD-10-CM | POA: Diagnosis not present

## 2019-06-29 DIAGNOSIS — G894 Chronic pain syndrome: Secondary | ICD-10-CM | POA: Diagnosis not present

## 2019-06-29 DIAGNOSIS — M5136 Other intervertebral disc degeneration, lumbar region: Secondary | ICD-10-CM | POA: Diagnosis not present

## 2019-06-30 DIAGNOSIS — Z23 Encounter for immunization: Secondary | ICD-10-CM | POA: Diagnosis not present

## 2019-07-01 DIAGNOSIS — Z79899 Other long term (current) drug therapy: Secondary | ICD-10-CM | POA: Diagnosis not present

## 2019-07-01 DIAGNOSIS — Z5181 Encounter for therapeutic drug level monitoring: Secondary | ICD-10-CM | POA: Diagnosis not present

## 2019-07-19 DIAGNOSIS — Z85828 Personal history of other malignant neoplasm of skin: Secondary | ICD-10-CM | POA: Diagnosis not present

## 2019-07-19 DIAGNOSIS — C44722 Squamous cell carcinoma of skin of right lower limb, including hip: Secondary | ICD-10-CM | POA: Diagnosis not present

## 2019-07-19 DIAGNOSIS — L400 Psoriasis vulgaris: Secondary | ICD-10-CM | POA: Diagnosis not present

## 2019-07-19 DIAGNOSIS — L853 Xerosis cutis: Secondary | ICD-10-CM | POA: Diagnosis not present

## 2019-07-20 ENCOUNTER — Ambulatory Visit (INDEPENDENT_AMBULATORY_CARE_PROVIDER_SITE_OTHER): Payer: Medicare Other | Admitting: Psychology

## 2019-07-20 DIAGNOSIS — F331 Major depressive disorder, recurrent, moderate: Secondary | ICD-10-CM | POA: Diagnosis not present

## 2019-08-04 ENCOUNTER — Encounter (HOSPITAL_BASED_OUTPATIENT_CLINIC_OR_DEPARTMENT_OTHER): Payer: Self-pay

## 2019-08-04 ENCOUNTER — Other Ambulatory Visit: Payer: Self-pay

## 2019-08-04 ENCOUNTER — Emergency Department (HOSPITAL_BASED_OUTPATIENT_CLINIC_OR_DEPARTMENT_OTHER)
Admission: EM | Admit: 2019-08-04 | Discharge: 2019-08-04 | Disposition: A | Discharge status: Home / Self Care | Payer: Medicare Other | Attending: Emergency Medicine | Admitting: Emergency Medicine

## 2019-08-04 ENCOUNTER — Ambulatory Visit (INDEPENDENT_AMBULATORY_CARE_PROVIDER_SITE_OTHER): Payer: Medicare Other | Admitting: Family Medicine

## 2019-08-04 ENCOUNTER — Encounter: Payer: Self-pay | Admitting: Family Medicine

## 2019-08-04 ENCOUNTER — Emergency Department (HOSPITAL_BASED_OUTPATIENT_CLINIC_OR_DEPARTMENT_OTHER): Payer: Medicare Other

## 2019-08-04 DIAGNOSIS — Z20828 Contact with and (suspected) exposure to other viral communicable diseases: Secondary | ICD-10-CM | POA: Insufficient documentation

## 2019-08-04 DIAGNOSIS — R6883 Chills (without fever): Secondary | ICD-10-CM | POA: Insufficient documentation

## 2019-08-04 DIAGNOSIS — Z87891 Personal history of nicotine dependence: Secondary | ICD-10-CM | POA: Insufficient documentation

## 2019-08-04 DIAGNOSIS — Z79899 Other long term (current) drug therapy: Secondary | ICD-10-CM | POA: Diagnosis not present

## 2019-08-04 DIAGNOSIS — R079 Chest pain, unspecified: Secondary | ICD-10-CM | POA: Diagnosis not present

## 2019-08-04 DIAGNOSIS — J45909 Unspecified asthma, uncomplicated: Secondary | ICD-10-CM | POA: Insufficient documentation

## 2019-08-04 DIAGNOSIS — E039 Hypothyroidism, unspecified: Secondary | ICD-10-CM | POA: Diagnosis not present

## 2019-08-04 DIAGNOSIS — R0789 Other chest pain: Secondary | ICD-10-CM | POA: Diagnosis not present

## 2019-08-04 LAB — BASIC METABOLIC PANEL
Anion gap: 6 (ref 5–15)
BUN: 15 mg/dL (ref 8–23)
CO2: 27 mmol/L (ref 22–32)
Calcium: 8.9 mg/dL (ref 8.9–10.3)
Chloride: 106 mmol/L (ref 98–111)
Creatinine, Ser: 0.86 mg/dL (ref 0.44–1.00)
GFR calc Af Amer: >60 mL/min (ref >60)
GFR calc non Af Amer: >60 mL/min (ref >60)
Glucose, Bld: 101 mg/dL — ABNORMAL HIGH (ref 70–99)
Potassium: 3.7 mmol/L (ref 3.5–5.1)
Sodium: 139 mmol/L (ref 135–145)

## 2019-08-04 LAB — CBC
HCT: 37.4 % (ref 36.0–46.0)
Hemoglobin: 11.6 g/dL — ABNORMAL LOW (ref 12.0–15.0)
MCH: 29.4 pg (ref 26.0–34.0)
MCHC: 31 g/dL (ref 30.0–36.0)
MCV: 94.9 fL (ref 80.0–100.0)
Platelets: 242 10*3/uL (ref 150–400)
RBC: 3.94 MIL/uL (ref 3.87–5.11)
RDW: 13.9 % (ref 11.5–15.5)
WBC: 4.6 10*3/uL (ref 4.0–10.5)
nRBC: 0 % (ref 0.0–0.2)

## 2019-08-04 LAB — TROPONIN I (HIGH SENSITIVITY): Troponin I (High Sensitivity): 2 ng/L (ref <18)

## 2019-08-04 MED ORDER — SODIUM CHLORIDE 0.9% FLUSH
3.0000 mL | Freq: Once | INTRAVENOUS | Status: DC
  Filled 2019-08-04: qty 3

## 2019-08-04 NOTE — ED Notes (Signed)
ED Provider at bedside. 

## 2019-08-04 NOTE — ED Provider Notes (Signed)
[facility] EMERGENCY DEPARTMENT Provider Note   CSN: [id number] Arrival date & time: 08/04/19  1515     History   Chief Complaint Chief Complaint  Patient presents with  . Chest Pain    HPI Mary Buck is a 83 y.o. female.  Presents emerge department chief complaint of chest pain.  Patient states 3 days ago she noted episode of chest pain after lifting heavy boxes.  Since that time has had random episodes of chest pain, not associated with exertion, last for a few minutes and resolve spontaneously.  Dull achy pain, worse over left side, nonradiating, mild in severity.  Since yesterday also has been having intermittent chills.  Currently she does not have any chills, no chest pain at this time.  Has not had any associated shortness of breath.  Has a past medical history of skin cancer, no prior history of coronary artery disease, no diabetes, no high blood pressure, does take cholesterol medicine.  No sick contacts, no cough, no dysuria, no frequency.     HPI  Past Medical History:  Diagnosis Date  . Allergy   . Asthma   . Cancer ([place])   . Cataract   . DDD (degenerative disc disease), lumbar 06/01/2017  . Depression   . Heart murmur   . Hyperlipidemia   . Peripheral neuropathy 04/30/2017  . Thyroid disease     Patient Active Problem List   Diagnosis Date Noted  . Osteopenia 05/30/2019  . Pedal edema 01/31/2018  . Cellulitis 01/31/2018  . Urinary hesitancy 01/31/2018  . Squamous cell carcinoma of skin of left lower limb, including hip 12/16/2017  . Plantar fasciitis 07/07/2017  . DDD (degenerative disc disease), lumbar 06/01/2017  . Fibromyalgia 06/01/2017  . Spinal stenosis at L4-L5 level 04/30/2017  . Peripheral neuropathy 04/30/2017  . Encounter for medication monitoring 03/05/2017  . Bladder cancer ([place]) 08/20/2015  . Cervical stenosis of spinal canal 07/27/2015  . Spinal stenosis of cervical region 07/27/2015  . Spondylolisthesis 07/27/2015  .  Hypothyroid 07/05/2015  . Abdominal pain, acute, left upper quadrant 06/28/2015  . Basal cell carcinoma, face 06/28/2015  . Bilateral groin pain 06/28/2015  . Chronic pain 06/28/2015  . Fatigue 06/28/2015  . Hyperlipidemia 06/28/2015  . Pain in joints 06/28/2015  . Paresthesia of lower lip 06/28/2015  . Pulmonary nodule 06/28/2015  . Pustulosis palmaris et plantaris 06/28/2015  . Ulcer of nose 06/28/2015  . 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  . Mass of parotid gland 03/24/2014    Past Surgical History:  Procedure Laterality Date  . bladder cancer  2013  . CARPAL TUNNEL RELEASE  2015  . CATARACT EXTRACTION Bilateral 2012  . CHOLECYSTECTOMY    . SALIVARY GLAND SURGERY Left 2015   benign  . TONSILLECTOMY AND ADENOIDECTOMY  1943     OB History   No obstetric history on file.      Home Medications    Prior to Admission medications   Medication Sig Start Date End Date Taking? Authorizing Provider  Cholecalciferol (VITAMIN D-3 PO) Take 1,000 Units by mouth daily.    [provider]  gabapentin (NEURONTIN) 300 MG capsule Take 1 capsule (300 mg total) by mouth 3 (three) times daily. Patient taking differently: Take 300 mg by mouth 3 (three) times daily. Third dose is 600 mg 04/30/17   [facility], Gay Filler, MD  lactobacillus acidophilus (BACID) TABS tablet Take 1 tablet by mouth daily.     [provider]  lansoprazole (PREVACID) 30 MG capsule TAKE  ONE CAPSULE BY MOUTH TWICE DAILY BEFORE A MEAL. 09/02/18   [facility], Gay Filler, MD  levothyroxine (SYNTHROID) 88 MCG tablet TAKE 1 TABLET(88 MCG) BY MOUTH DAILY BEFORE BREAKFAST 06/16/19   [facility], Gay Filler, MD  misoprostol (CYTOTEC) 100 MCG tablet  02/16/19   [provider]  simvastatin (ZOCOR) 20 MG tablet TAKE 1 TABLET BY MOUTH DAILY 05/02/19   [facility], Gay Filler, MD  traMADol (ULTRAM) 50 MG tablet Take 50 mg by mouth 3 (three) times daily as needed.    [provider]  triamcinolone cream (KENALOG) 0.1 % APPLY  TOPICALLY TO THE AFFECTED AREA TWICE DAILY AS NEEDED FOR SPOTS ON YOUR SKIN 06/21/19   [facility], Gay Filler, MD    Family History Family History  Problem Relation Age of Onset  . Hyperlipidemia Father   . Stroke Father     Social History Social History   Tobacco Use  . Smoking status: Former Smoker    Quit date: 2015    Years since quitting: 5.8  . Smokeless tobacco: Never Used  Substance Use Topics  . Alcohol use: No  . Drug use: No     Allergies   Cephalexin, Contrast media [iodinated diagnostic agents], Latex, Mirtazapine, Penicillins, Sulfa antibiotics, Sulfur, and Acitretin   Review of Systems Review of Systems  Constitutional: Positive for chills. Negative for fever.  HENT: Negative for ear pain and sore throat.   Eyes: Negative for pain and visual disturbance.  Respiratory: Negative for cough and shortness of breath.   Cardiovascular: Positive for chest pain. Negative for palpitations.  Gastrointestinal: Negative for abdominal pain and vomiting.  Genitourinary: Negative for dysuria and hematuria.  Musculoskeletal: Negative for arthralgias and back pain.  Skin: Negative for color change and rash.  Neurological: Negative for seizures and syncope.  All other systems reviewed and are negative.    Physical Exam Updated Vital Signs BP (!) 172/74 (BP Location: Left Arm)   Pulse 70   Temp 98.5 F (36.9 C) (Oral)   Resp 20   Ht 5\' 5"  (1.651 m)   Wt 72.1 kg   SpO2 100%   BMI 26.46 kg/m   Physical Exam Vitals signs and nursing note reviewed.  Constitutional:      General: She is not in acute distress.    Appearance: She is well-developed.  HENT:     Head: Normocephalic and atraumatic.  Eyes:     Conjunctiva/sclera: Conjunctivae normal.  Neck:     Musculoskeletal: Neck supple.  Cardiovascular:     Rate and Rhythm: Normal rate and regular rhythm.     Heart sounds: Normal heart sounds. No murmur.  Pulmonary:     Effort: Pulmonary effort is normal. No  respiratory distress.     Breath sounds: Normal breath sounds.  Chest:     Comments: Tenderness over anterior chest wall Abdominal:     Palpations: Abdomen is soft.     Tenderness: There is no abdominal tenderness.  Musculoskeletal:     Right lower leg: She exhibits no tenderness. No edema.     Left lower leg: She exhibits no tenderness. No edema.  Skin:    General: Skin is warm and dry.  Neurological:     General: No focal deficit present.     Mental Status: She is alert and oriented to person, place, and time.      ED Treatments / Results  Labs (all labs ordered are listed, but only abnormal results are displayed) Labs Reviewed  BASIC METABOLIC PANEL  CBC  TROPONIN I (HIGH SENSITIVITY)    EKG EKG Interpretation  Date/Time:  Thursday August 04 2019 15:28:20 EST Ventricular Rate:  71 PR Interval:  164 QRS Duration: 90 QT Interval:  400 QTC Calculation: 434 R Axis:   -56 Text Interpretation: Normal sinus rhythm Left axis deviation RSR' or QR pattern in V1 suggests right ventricular conduction delay Possible Anterior infarct , age undetermined Abnormal ECG no prior available for comparison Confirmed by Madalyn Rob [phone]) on 08/04/2019 3:59:23 PM   Radiology Dg Chest 2 View  Result Date: 08/04/2019 CLINICAL DATA:  Chest pain. EXAM: CHEST - 2 VIEW COMPARISON:  None. FINDINGS: The heart size and pulmonary vascularity are normal. There is tortuosity and calcification of the thoracic aorta. Lungs are clear. No effusions. No significant bone abnormality. IMPRESSION: No acute abnormalities. Aortic Atherosclerosis (ICD10-I70.0). Electronically Signed   By: Lorriane Shire M.D.   On: 08/04/2019 16:20    Procedures Procedures (including critical care time)  Medications Ordered in ED Medications  sodium chloride flush (NS) 0.9 % injection 3 mL (3 mLs Intravenous Not Given 08/04/19 1623)     Initial Impression / Assessment and Plan / ED Course  I have reviewed the  triage vital signs and the nursing notes.  Pertinent labs & imaging results that were available during my care of the patient were reviewed by me and considered in my medical decision making (see chart for details).  Clinical Course as of Aug 03 1716  Thu Aug 04, 2019  1647 Completed initial assessment, if trop neg, will dc    [RD]    Clinical Course User Index [RD] Lucrezia Starch, MD       83 year old lady presents with chest pain for the past 3 days, brief episodes, not associated with exertion, no shortness of breath associated.  EKG without acute ischemic changes, troponin within normal limits, chest x-ray negative.  Doubt acute cardiac process.  No shortness of breath, normal vital signs, doubt PE.  Suspect MSK strain.  Chills since last night intermittently, no fever here, no fever at home.  No other associated symptoms except for the chest pain.  Will check COVID-19, recommend follow-up with primary doctor early next week.  Reviewed return precautions, believe appropriate for outpatient management discharge at this time.    After the discussed management above, the patient was determined to be safe for discharge.  The patient was in agreement with this plan and all questions regarding their care were answered.  ED return precautions were discussed and the patient will return to the ED with any significant worsening of condition.    Final Clinical Impressions(s) / ED Diagnoses   Final diagnoses:  Chest pain, unspecified type  Chills    ED Discharge Orders    None       Lucrezia Starch, MD 08/04/19 1721

## 2019-08-04 NOTE — ED Triage Notes (Signed)
Pt c/o CP x 3 days-denies cough/fever-chills started last night-pt NAD-steady gait

## 2019-08-04 NOTE — Progress Notes (Signed)
[facility] [address] [place], [place] [address] [phone]  Date:  08/04/2019   Name:  Mary Buck   DOB:  Aug 16, 1936   MRN:  [id number]  PCP:  Darreld Mclean, MD    Chief Complaint: No chief complaint on file.   History of Present Illness:  Mary Buck is a 83 y.o. very pleasant female patient who presents with the following:  Virtual visit today for this woman who is my primary care patient Last seen by myself in August of this year History of spinal stenosis, hyperlipidemia, neuropathy, extensive squamous cell carcinoma of the left leg status post skin graft, hypothyroidism  Patient location is home, provider location is office Patient identity confirmed with 2 factors, she gives consent for virtual visit today  She first felt ill 3-4 days ago-however last night she began to feel worse Last night she was sick with chills and malaise No fever noted, just chills Her sinuses feel congested Mild cough No urinary sx noted  No abd pain, no vomiting or diarrhea  She is just checking her temp at home- not checking any other vitals -they do not have a blood pressure cuff   Patient Active Problem List   Diagnosis Date Noted  . Osteopenia 05/30/2019  . Pedal edema 01/31/2018  . Cellulitis 01/31/2018  . Urinary hesitancy 01/31/2018  . Squamous cell carcinoma of skin of left lower limb, including hip 12/16/2017  . Plantar fasciitis 07/07/2017  . DDD (degenerative disc disease), lumbar 06/01/2017  . Fibromyalgia 06/01/2017  . Spinal stenosis at L4-L5 level 04/30/2017  . Peripheral neuropathy 04/30/2017  . Encounter for medication monitoring 03/05/2017  . Bladder cancer ([place]) 08/20/2015  . Cervical stenosis of spinal canal 07/27/2015  . Spinal stenosis of cervical region 07/27/2015  . Spondylolisthesis 07/27/2015  . Hypothyroid 07/05/2015  . Abdominal pain, acute, left upper quadrant 06/28/2015   . Basal cell carcinoma, face 06/28/2015  . Bilateral groin pain 06/28/2015  . Chronic pain 06/28/2015  . Fatigue 06/28/2015  . Hyperlipidemia 06/28/2015  . Pain in joints 06/28/2015  . Paresthesia of lower lip 06/28/2015  . Pulmonary nodule 06/28/2015  . Pustulosis palmaris et plantaris 06/28/2015  . Ulcer of nose 06/28/2015  . 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  . Mass of parotid gland 03/24/2014    Past Medical History:  Diagnosis Date  . Allergy   . Asthma   . Cancer ([place])   . Cataract   . DDD (degenerative disc disease), lumbar 06/01/2017  . Depression   . Heart murmur   . Hyperlipidemia   . Peripheral neuropathy 04/30/2017  . Thyroid disease     Past Surgical History:  Procedure Laterality Date  . bladder cancer  2013  . CARPAL TUNNEL RELEASE  2015  . CATARACT EXTRACTION Bilateral 2012  . CHOLECYSTECTOMY    . SALIVARY GLAND SURGERY Left 2015   benign  . TONSILLECTOMY AND ADENOIDECTOMY  1943    Social History   Tobacco Use  . Smoking status: Former Smoker    Quit date: 2015    Years since quitting: 5.8  . Smokeless tobacco: Never Used  Substance Use Topics  . Alcohol use: No  . Drug use: No    Family History  Problem Relation Age of Onset  . Hyperlipidemia Father   . Stroke Father     Allergies  Allergen Reactions  . Cephalexin     Other reaction(s): unsure  . Contrast Media [  Iodinated Diagnostic Agents]   . Latex     Other reaction(s): blistering, redness   . Mirtazapine     Other reaction(s): nausea  . Penicillins   . Sulfa Antibiotics   . Sulfur Other (See Comments)  . Acitretin Rash    Medication list has been reviewed and updated.  Current Outpatient Medications on File Prior to Visit  Medication Sig Dispense Refill  . Cholecalciferol (VITAMIN D-3 PO) Take 1,000 Units by mouth daily.    Marland Kitchen gabapentin (NEURONTIN) 300 MG capsule Take 1 capsule (300 mg total) by mouth 3 (three) times daily. (Patient taking differently: Take 300 mg by mouth 3  (three) times daily. Third dose is 600 mg) 90 capsule 4  . lactobacillus acidophilus (BACID) TABS tablet Take 1 tablet by mouth daily.     . lansoprazole (PREVACID) 30 MG capsule TAKE ONE CAPSULE BY MOUTH TWICE DAILY BEFORE A MEAL. 180 capsule 3  . levothyroxine (SYNTHROID) 88 MCG tablet TAKE 1 TABLET(88 MCG) BY MOUTH DAILY BEFORE BREAKFAST 90 tablet 1  . misoprostol (CYTOTEC) 100 MCG tablet     . simvastatin (ZOCOR) 20 MG tablet TAKE 1 TABLET BY MOUTH DAILY 90 tablet 1  . traMADol (ULTRAM) 50 MG tablet Take 50 mg by mouth 3 (three) times daily as needed.    . triamcinolone cream (KENALOG) 0.1 % APPLY TOPICALLY TO THE AFFECTED AREA TWICE DAILY AS NEEDED FOR SPOTS ON YOUR SKIN 45 g 1   No current facility-administered medications on file prior to visit.     Review of Systems:  As per HPI- otherwise negative.   Physical Examination: There were no vitals filed for this visit. There were no vitals filed for this visit. There is no height or weight on file to calculate BMI. Ideal Body Weight:    Spoke with patient on telephone She sounds her normal self, no cough or distress is noted  Assessment and Plan: Chills  Elderly patient who is sick with chills and malaise.  She does not seem toxic, but given her age she is at higher risk for complications of any infection I advised her to go to the emergency department for further evaluation.  She likely needs a COVID-19 test, and also may need blood work, chest film, urinalysis.  She states agreement with plan-she intends to come to the [facility] emergency room Unfortunately I am not able to see her in my office due to inadequate PPE  Spoke with patient for about 7 minutes on the telephone Signed Lamar Blinks, MD

## 2019-08-04 NOTE — Discharge Instructions (Addendum)
Recommend following isolation precautions until we have the results of your COVID-19 test.  Recommend follow-up with your primary doctor early next week for recheck.  Return to ER if you have a fever, any difficulty breathing, recurrent severe chest pain or other new concerning symptom.

## 2019-08-06 LAB — NOVEL CORONAVIRUS, NAA (HOSP ORDER, SEND-OUT TO REF LAB; TAT 18-24 HRS): SARS-CoV-2, NAA: NOT DETECTED

## 2019-08-08 DIAGNOSIS — Z85828 Personal history of other malignant neoplasm of skin: Secondary | ICD-10-CM | POA: Diagnosis not present

## 2019-08-08 DIAGNOSIS — L400 Psoriasis vulgaris: Secondary | ICD-10-CM | POA: Diagnosis not present

## 2019-08-08 DIAGNOSIS — Z79899 Other long term (current) drug therapy: Secondary | ICD-10-CM | POA: Diagnosis not present

## 2019-08-08 DIAGNOSIS — C44722 Squamous cell carcinoma of skin of right lower limb, including hip: Secondary | ICD-10-CM | POA: Diagnosis not present

## 2019-08-08 DIAGNOSIS — L570 Actinic keratosis: Secondary | ICD-10-CM | POA: Diagnosis not present

## 2019-08-16 DIAGNOSIS — M5136 Other intervertebral disc degeneration, lumbar region: Secondary | ICD-10-CM | POA: Diagnosis not present

## 2019-08-16 DIAGNOSIS — M48062 Spinal stenosis, lumbar region with neurogenic claudication: Secondary | ICD-10-CM | POA: Diagnosis not present

## 2019-08-16 DIAGNOSIS — G894 Chronic pain syndrome: Secondary | ICD-10-CM | POA: Diagnosis not present

## 2019-08-19 ENCOUNTER — Ambulatory Visit (INDEPENDENT_AMBULATORY_CARE_PROVIDER_SITE_OTHER): Payer: Medicare Other | Admitting: Psychology

## 2019-08-19 DIAGNOSIS — F331 Major depressive disorder, recurrent, moderate: Secondary | ICD-10-CM | POA: Diagnosis not present

## 2019-08-25 DIAGNOSIS — M48062 Spinal stenosis, lumbar region with neurogenic claudication: Secondary | ICD-10-CM | POA: Diagnosis not present

## 2019-09-26 ENCOUNTER — Ambulatory Visit (INDEPENDENT_AMBULATORY_CARE_PROVIDER_SITE_OTHER): Payer: Self-pay | Admitting: Psychology

## 2019-09-26 DIAGNOSIS — F331 Major depressive disorder, recurrent, moderate: Secondary | ICD-10-CM

## 2019-10-17 ENCOUNTER — Ambulatory Visit (INDEPENDENT_AMBULATORY_CARE_PROVIDER_SITE_OTHER): Payer: Self-pay | Admitting: Psychology

## 2019-10-17 DIAGNOSIS — F331 Major depressive disorder, recurrent, moderate: Secondary | ICD-10-CM

## 2019-10-25 ENCOUNTER — Telehealth: Payer: Self-pay | Admitting: Family Medicine

## 2019-10-25 DIAGNOSIS — K219 Gastro-esophageal reflux disease without esophagitis: Secondary | ICD-10-CM

## 2019-10-25 NOTE — Telephone Encounter (Signed)
Medication: lansoprazole (PREVACID) 30 MG capsule   Has the patient contacted their pharmacy? Yes.   (If no, request that the patient contact the pharmacy for the refill.) (If yes, when and what did the pharmacy advise?) PT was told that [facility] denied refill   Preferred Pharmacy (with phone number or street name):  [id number] - [place], [place] - [address]  [address] [place], [place] [address]  Phone:  [phone] Fax:  [phone]    Agent: Please be advised that RX refills may take up to 3 business days. We ask that you follow-up with your pharmacy.

## 2019-10-26 MED ORDER — LANSOPRAZOLE 30 MG PO CPDR: DELAYED_RELEASE_CAPSULE | ORAL | 3 refills | Status: DC

## 2019-10-26 NOTE — Telephone Encounter (Signed)
Medication refilled

## 2019-10-31 ENCOUNTER — Encounter: Payer: Self-pay | Admitting: Family Medicine

## 2019-11-07 ENCOUNTER — Ambulatory Visit (INDEPENDENT_AMBULATORY_CARE_PROVIDER_SITE_OTHER): Payer: Medicare Other | Admitting: Psychology

## 2019-11-07 DIAGNOSIS — F331 Major depressive disorder, recurrent, moderate: Secondary | ICD-10-CM | POA: Diagnosis not present

## 2019-11-22 ENCOUNTER — Encounter (HOSPITAL_BASED_OUTPATIENT_CLINIC_OR_DEPARTMENT_OTHER): Payer: Self-pay | Admitting: *Deleted

## 2019-11-22 ENCOUNTER — Emergency Department (HOSPITAL_BASED_OUTPATIENT_CLINIC_OR_DEPARTMENT_OTHER)
Admission: EM | Admit: 2019-11-22 | Discharge: 2019-11-22 | Disposition: A | Discharge status: Home / Self Care | Payer: Medicare Other | Attending: Emergency Medicine | Admitting: Emergency Medicine

## 2019-11-22 ENCOUNTER — Other Ambulatory Visit: Payer: Self-pay

## 2019-11-22 ENCOUNTER — Emergency Department (HOSPITAL_BASED_OUTPATIENT_CLINIC_OR_DEPARTMENT_OTHER): Payer: Medicare Other

## 2019-11-22 DIAGNOSIS — Y9389 Activity, other specified: Secondary | ICD-10-CM | POA: Insufficient documentation

## 2019-11-22 DIAGNOSIS — Z91040 Latex allergy status: Secondary | ICD-10-CM | POA: Insufficient documentation

## 2019-11-22 DIAGNOSIS — S42291A Other displaced fracture of upper end of right humerus, initial encounter for closed fracture: Secondary | ICD-10-CM | POA: Diagnosis not present

## 2019-11-22 DIAGNOSIS — S0993XA Unspecified injury of face, initial encounter: Secondary | ICD-10-CM | POA: Diagnosis present

## 2019-11-22 DIAGNOSIS — Y999 Unspecified external cause status: Secondary | ICD-10-CM | POA: Diagnosis not present

## 2019-11-22 DIAGNOSIS — Z87891 Personal history of nicotine dependence: Secondary | ICD-10-CM | POA: Insufficient documentation

## 2019-11-22 DIAGNOSIS — S0240CA Maxillary fracture, right side, initial encounter for closed fracture: Secondary | ICD-10-CM | POA: Diagnosis not present

## 2019-11-22 DIAGNOSIS — W01198A Fall on same level from slipping, tripping and stumbling with subsequent striking against other object, initial encounter: Secondary | ICD-10-CM | POA: Diagnosis not present

## 2019-11-22 DIAGNOSIS — Z79899 Other long term (current) drug therapy: Secondary | ICD-10-CM | POA: Insufficient documentation

## 2019-11-22 DIAGNOSIS — W19XXXA Unspecified fall, initial encounter: Secondary | ICD-10-CM

## 2019-11-22 DIAGNOSIS — S0240DA Maxillary fracture, left side, initial encounter for closed fracture: Secondary | ICD-10-CM

## 2019-11-22 DIAGNOSIS — Y92008 Other place in unspecified non-institutional (private) residence as the place of occurrence of the external cause: Secondary | ICD-10-CM | POA: Insufficient documentation

## 2019-11-22 DIAGNOSIS — E039 Hypothyroidism, unspecified: Secondary | ICD-10-CM | POA: Diagnosis not present

## 2019-11-22 DIAGNOSIS — S42201A Unspecified fracture of upper end of right humerus, initial encounter for closed fracture: Secondary | ICD-10-CM

## 2019-11-22 DIAGNOSIS — S02842A Fracture of lateral orbital wall, left side, initial encounter for closed fracture: Secondary | ICD-10-CM

## 2019-11-22 MED ORDER — HYDROCODONE-ACETAMINOPHEN 5-325 MG PO TABS
1.0000 | ORAL_TABLET | Freq: Once | ORAL | Status: AC
  Administered 2019-11-22: 1 via ORAL

## 2019-11-22 MED ORDER — HYDROCODONE-ACETAMINOPHEN 5-325 MG PO TABS
2.0000 | ORAL_TABLET | Freq: Once | ORAL | Status: DC
  Filled 2019-11-22: qty 2

## 2019-11-22 MED ORDER — HYDROCODONE-ACETAMINOPHEN 5-325 MG PO TABS: 1.0000 | ORAL_TABLET | Freq: Four times a day (QID) | ORAL | 0 refills | Status: DC | PRN

## 2019-11-22 NOTE — ED Provider Notes (Signed)
[facility] DEPART[facility] Provider Note   CSN: [id number] Arrival date & time: 11/22/19  1455     History Chief Complaint  Patient presents with  . Fall    Mary Buck is a 84 y.o. female.  Patient is an 84 year old female with past medical history of asthma, hyperlipidemia, degenerative disc disease.  She presents today for evaluation of fall.  Patient states that she tripped at home and fell to the floor, injuring her right shoulder and right side of face.  She denies loss of consciousness.  No neck pain or visual disturbances, but she does have some swelling around her right eye and bleeding from the right nostril.  She took tramadol prior to coming here with little relief.  The history is provided by the patient.  Fall This is a new problem. The current episode started less than 1 hour ago. The problem occurs constantly. The problem has not changed since onset.Associated symptoms include headaches. Pertinent negatives include no chest pain and no shortness of breath. Associated symptoms comments: Shoulder pain. Exacerbated by: Movement and palpation. Nothing relieves the symptoms. Treatments tried: Tramadol. The treatment provided mild relief.       Past Medical History:  Diagnosis Date  . Allergy   . Asthma   . Cancer ([place])   . Cataract   . DDD (degenerative disc disease), lumbar 06/01/2017  . Depression   . Heart murmur   . Hyperlipidemia   . Peripheral neuropathy 04/30/2017  . Thyroid disease     Patient Active Problem List   Diagnosis Date Noted  . Osteopenia 05/30/2019  . Pedal edema 01/31/2018  . Cellulitis 01/31/2018  . Urinary hesitancy 01/31/2018  . Squamous cell carcinoma of skin of left lower limb, including hip 12/16/2017  . Plantar fasciitis 07/07/2017  . DDD (degenerative disc disease), lumbar 06/01/2017  . Fibromyalgia 06/01/2017  . Spinal stenosis at L4-L5 level 04/30/2017  . Peripheral neuropathy 04/30/2017  . Encounter for  medication monitoring 03/05/2017  . Bladder cancer ([place]) 08/20/2015  . Cervical stenosis of spinal canal 07/27/2015  . Spinal stenosis of cervical region 07/27/2015  . Spondylolisthesis 07/27/2015  . Hypothyroid 07/05/2015  . Abdominal pain, acute, left upper quadrant 06/28/2015  . Basal cell carcinoma, face 06/28/2015  . Bilateral groin pain 06/28/2015  . Chronic pain 06/28/2015  . Fatigue 06/28/2015  . Hyperlipidemia 06/28/2015  . Pain in joints 06/28/2015  . Paresthesia of lower lip 06/28/2015  . Pulmonary nodule 06/28/2015  . Pustulosis palmaris et plantaris 06/28/2015  . Ulcer of nose 06/28/2015  . 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  . Mass of parotid gland 03/24/2014    Past Surgical History:  Procedure Laterality Date  . bladder cancer  2013  . CARPAL TUNNEL RELEASE  2015  . CATARACT EXTRACTION Bilateral 2012  . CHOLECYSTECTOMY    . SALIVARY GLAND SURGERY Left 2015   benign  . TONSILLECTOMY AND ADENOIDECTOMY  1943     OB History   No obstetric history on file.     Family History  Problem Relation Age of Onset  . Hyperlipidemia Father   . Stroke Father     Social History   Tobacco Use  . Smoking status: Former Smoker    Quit date: 2015    Years since quitting: 6.1  . Smokeless tobacco: Never Used  Substance Use Topics  . Alcohol use: No  . Drug use: No    Home Medications Prior to Admission medications   Medication Sig Start Date End Date  Taking? Authorizing Provider  Cholecalciferol (VITAMIN D-3 PO) Take 1,000 Units by mouth daily.    [provider]  gabapentin (NEURONTIN) 300 MG capsule Take 1 capsule (300 mg total) by mouth 3 (three) times daily. Patient taking differently: Take 300 mg by mouth 3 (three) times daily. Third dose is 600 mg 04/30/17   Copland, Gay Filler, MD  lactobacillus acidophilus (BACID) TABS tablet Take 1 tablet by mouth daily.     [provider]  lansoprazole (PREVACID) 30 MG capsule TAKE ONE CAPSULE BY MOUTH TWICE  DAILY BEFORE A MEAL. 10/26/19   Copland, Gay Filler, MD  levothyroxine (SYNTHROID) 88 MCG tablet TAKE 1 TABLET(88 MCG) BY MOUTH DAILY BEFORE BREAKFAST 06/16/19   Copland, Gay Filler, MD  misoprostol (CYTOTEC) 100 MCG tablet  02/16/19   [provider]  simvastatin (ZOCOR) 20 MG tablet TAKE 1 TABLET BY MOUTH DAILY 05/02/19   Copland, Gay Filler, MD  traMADol (ULTRAM) 50 MG tablet Take 50 mg by mouth 3 (three) times daily as needed.    [provider]  triamcinolone cream (KENALOG) 0.1 % APPLY TOPICALLY TO THE AFFECTED AREA TWICE DAILY AS NEEDED FOR SPOTS ON YOUR SKIN 06/21/19   Copland, Gay Filler, MD    Allergies    Cephalexin, Contrast media [iodinated diagnostic agents], Latex, Mirtazapine, Penicillins, Sulfa antibiotics, Sulfur, and Acitretin  Review of Systems   Review of Systems  Respiratory: Negative for shortness of breath.   Cardiovascular: Negative for chest pain.  Neurological: Positive for headaches.  All other systems reviewed and are negative.   Physical Exam Updated Vital Signs BP (!) 177/91   Pulse 72   Temp 97.8 F (36.6 C) (Oral)   Resp 18   Ht 5\' 5"  (1.651 m)   Wt 72.6 kg   SpO2 100%   BMI 26.63 kg/m   Physical Exam Vitals and nursing note reviewed.  Constitutional:      General: She is not in acute distress.    Appearance: She is well-developed. She is not diaphoretic.  HENT:     Head: Normocephalic and atraumatic.     Nose:     Comments: There is a slight trickle of blood from the right nostril.  The septum is midline and there is no obvious nasal deformity. Eyes:     Extraocular Movements: Extraocular movements intact.     Pupils: Pupils are equal, round, and reactive to light.     Comments: Patient has multiple small contusions lateral to the right eye.  No proptosis.  No diplopia on upward gaze.  Neck:     Comments: There is no cervical spine tenderness or stepoff.  Patient has painless range of motion in all directions.   Cardiovascular:       Rate and Rhythm: Normal rate and regular rhythm.     Heart sounds: No murmur. No friction rub. No gallop.   Pulmonary:     Effort: Pulmonary effort is normal. No respiratory distress.     Breath sounds: Normal breath sounds. No wheezing.  Abdominal:     General: Bowel sounds are normal. There is no distension.     Palpations: Abdomen is soft.     Tenderness: There is no abdominal tenderness.  Musculoskeletal:        General: Normal range of motion.     Cervical back: Normal range of motion and neck supple.  Skin:    General: Skin is warm and dry.  Neurological:     General: No focal deficit  present.     Mental Status: She is alert and oriented to person, place, and time.     Cranial Nerves: No cranial nerve deficit.     Motor: No weakness.     ED Results / Procedures / Treatments   Labs (all labs ordered are listed, but only abnormal results are displayed) Labs Reviewed - No data to display  EKG None  Radiology No results found.  Procedures Procedures (including critical care time)  Medications Ordered in ED Medications - No data to display  ED Course  I have reviewed the triage vital signs and the nursing notes.  Pertinent labs & imaging results that were available during my care of the patient were reviewed by me and considered in my medical decision making (see chart for details).    MDM Rules/Calculators/A&P  Patient presenting after fall, the events of which are described in the HPI.  Patient's x-rays show a proximal right humerus fracture and tripod fracture of the right maxillary wall and orbital floor which is nondisplaced.  She will be placed in a sling immobilizer and advised to ice, rest, and follow-up with orthopedics in the next week.  She will also be advised to follow-up with facial trauma in the next week.  Pain medication given here in the ER and prescribed for home.  She is to return to the ER for any new and/or concerning symptoms.  Final  Clinical Impression(s) / ED Diagnoses Final diagnoses:  None    Rx / DC Orders ED Discharge Orders    None       Veryl Speak, MD 11/22/19 1654

## 2019-11-22 NOTE — ED Notes (Signed)
Pt. Is in CT at this time .[facility]

## 2019-11-22 NOTE — Discharge Instructions (Signed)
Wear sling immobilizer as applied here in the ER.  Take hydrocodone as prescribed as needed for pain.  Ice for 20 minutes every 2 hours while awake for the next 2 days.  You are to follow-up with both ENT and orthopedics in the next week.  The contact information for both offices have been provided in this discharge summary for you to call and make these arrangements.

## 2019-11-22 NOTE — ED Triage Notes (Signed)
She fell and hit her right upper arm and the right side of her face. No LOC. She does not take blood thinners. Alert and oriented.

## 2019-11-23 ENCOUNTER — Ambulatory Visit (INDEPENDENT_AMBULATORY_CARE_PROVIDER_SITE_OTHER): Payer: Medicare Other | Admitting: Psychology

## 2019-11-23 DIAGNOSIS — F331 Major depressive disorder, recurrent, moderate: Secondary | ICD-10-CM | POA: Diagnosis not present

## 2019-12-07 ENCOUNTER — Ambulatory Visit (INDEPENDENT_AMBULATORY_CARE_PROVIDER_SITE_OTHER): Payer: Medicare Other | Admitting: Psychology

## 2019-12-07 DIAGNOSIS — F331 Major depressive disorder, recurrent, moderate: Secondary | ICD-10-CM

## 2019-12-12 ENCOUNTER — Ambulatory Visit (INDEPENDENT_AMBULATORY_CARE_PROVIDER_SITE_OTHER): Payer: Medicare Other | Admitting: Internal Medicine

## 2019-12-12 ENCOUNTER — Encounter: Payer: Self-pay | Admitting: Internal Medicine

## 2019-12-12 ENCOUNTER — Ambulatory Visit (HOSPITAL_BASED_OUTPATIENT_CLINIC_OR_DEPARTMENT_OTHER)
Admission: RE | Admit: 2019-12-12 | Discharge: 2019-12-12 | Disposition: A | Discharge status: Home / Self Care | Payer: Medicare Other | Source: Ambulatory Visit | Attending: Internal Medicine | Admitting: Internal Medicine

## 2019-12-12 ENCOUNTER — Other Ambulatory Visit: Payer: Self-pay

## 2019-12-12 VITALS — BP 166/67 | HR 70 | Temp 97.2°F | Resp 18 | Ht 66.0 in | Wt 173.2 lb

## 2019-12-12 DIAGNOSIS — M79662 Pain in left lower leg: Secondary | ICD-10-CM | POA: Insufficient documentation

## 2019-12-12 DIAGNOSIS — M7989 Other specified soft tissue disorders: Secondary | ICD-10-CM

## 2019-12-12 DIAGNOSIS — M25552 Pain in left hip: Secondary | ICD-10-CM

## 2019-12-12 NOTE — Progress Notes (Signed)
Pre visit review using our clinic review tool, if applicable. No additional management support is needed unless otherwise documented below in the visit note. 

## 2019-12-12 NOTE — Progress Notes (Signed)
Subjective:    Patient ID: Mary Buck, female    DOB: [birth year], 84 y.o.   MRN: [id number]  DOS:  12/12/2019 Type of visit - description: Acute, here with Mickie 1 week history of mild proximal left leg pain, worse for the last 2 to 3 days. Increase with walking or putting pressure on the leg. Denies any numbness or rash anywhere in the lower extremities.  She did have a fall 11/22/2019, broke her right arm, no other falls since then.  She also reports some swelling at the legs, worse on the left.   Review of Systems No chest pain no difficulty breathing No nausea or vomiting No cough  Past Medical History:  Diagnosis Date  . Allergy   . Asthma   . Cancer ([place])   . Cataract   . DDD (degenerative disc disease), lumbar 06/01/2017  . Depression   . Heart murmur   . Hyperlipidemia   . Peripheral neuropathy 04/30/2017  . Thyroid disease     Past Surgical History:  Procedure Laterality Date  . bladder cancer  2013  . CARPAL TUNNEL RELEASE  2015  . CATARACT EXTRACTION Bilateral 2012  . CHOLECYSTECTOMY    . SALIVARY GLAND SURGERY Left 2015   benign  . TONSILLECTOMY AND ADENOIDECTOMY  1943    Allergies as of 12/12/2019      Reactions   Cephalexin    Other reaction(s): unsure   Contrast Media [iodinated Diagnostic Agents]    Latex    Other reaction(s): blistering, redness    Mirtazapine    Other reaction(s): nausea   Penicillins    Sulfa Antibiotics    Sulfur Other (See Comments)   Acitretin Rash      Medication List       Accurate as of December 12, 2019  1:57 PM. If you have any questions, ask your nurse or doctor.        acitretin 25 MG capsule Commonly known as: SORIATANE Take 10 mg by mouth 3 (three) times a week.   celecoxib 200 MG capsule Commonly known as: CELEBREX Take 200 mg by mouth in the morning and at bedtime.   gabapentin 300 MG capsule Commonly known as: NEURONTIN Take 1 capsule (300 mg total) by mouth 3 (three) times daily. What  changed: additional instructions   HYDROcodone-acetaminophen 5-325 MG tablet Commonly known as: Norco Take 1-2 tablets by mouth every 6 (six) hours as needed.   lactobacillus acidophilus Tabs tablet Take 1 tablet by mouth daily.   lansoprazole 30 MG capsule Commonly known as: PREVACID TAKE ONE CAPSULE BY MOUTH TWICE DAILY BEFORE A MEAL.   levothyroxine 88 MCG tablet Commonly known as: SYNTHROID TAKE 1 TABLET(88 MCG) BY MOUTH DAILY BEFORE BREAKFAST   misoprostol 100 MCG tablet Commonly known as: CYTOTEC   simvastatin 20 MG tablet Commonly known as: ZOCOR TAKE 1 TABLET BY MOUTH DAILY   traMADol 50 MG tablet Commonly known as: ULTRAM Take 50 mg by mouth 3 (three) times daily as needed.   triamcinolone cream 0.1 % Commonly known as: KENALOG APPLY TOPICALLY TO THE AFFECTED AREA TWICE DAILY AS NEEDED FOR SPOTS ON YOUR SKIN   VITAMIN D-3 PO Take 1,000 Units by mouth daily.          Objective:   Physical Exam BP (!) 166/67 (BP Location: Left Arm, Patient Position: Sitting, Cuff Size: Normal)   Pulse 70   Temp (!) 97.2 F (36.2 C) (Temporal)   Resp 18   Ht 5'  6" (1.676 m)   Wt 173 lb 4 oz (78.6 kg)   SpO2 99%   BMI 27.96 kg/m  General:   Well developed, NAD, BMI noted. HEENT:  Normocephalic . Face symmetric, atraumatic Upper extremities Right arm is in a sling, significant pain with arm movement Lower extremities -Good femoral pulses bilaterally -Thighs measured and symmetric.  No rash -Calves: Symmetric however the left side seems a slightly tight and tender. -She has an atrophy at the left pretibial area for previous XRT due to skin cancer. -Trace edema at the right pretibial area. -Hips: Right: Normal to rotation, no trochanteric bursa pain Left: + Pain with rotation, slightly TTP at the trochanteric bursa. Skin: No rash neurologic:  alert & oriented X3.  Speech normal, gait unassisted, mild limping due to left hip pain Psych--  Cognition and judgment  appear intact.  Cooperative with normal attention span and concentration.  Behavior appropriate. No anxious or depressed appearing.      Assessment      84 year old female, PMH includes peripheral neuropathy, spinal stenosis with neurogenic claudication, chronic pain, DJD (sees pain management), thyroid disease presents with:  Left hip pain: As described above, last fall was 11/22/2018, pain is started a week ago. We will get x-ray, if negative I recommended her to seek the opinion of his pain medicine doctor, may need an MRI or further evaluation Left leg swelling: Subtle swelling, left calf is a slightly tender.  No history of DVT but that needs to be rule out.  Will get a ultrasound.   This visit occurred during the SARS-CoV-2 public health emergency.  Safety protocols were in place, including screening questions prior to the visit, additional usage of staff PPE, and extensive cleaning of exam room while observing appropriate contact time as indicated for disinfecting solutions.

## 2019-12-12 NOTE — Patient Instructions (Signed)
Please go to the first floor and get the x-ray and the ultrasound.  If you get  worse please let us know

## 2019-12-15 ENCOUNTER — Other Ambulatory Visit: Payer: Self-pay | Admitting: Family Medicine

## 2019-12-19 ENCOUNTER — Other Ambulatory Visit: Payer: Self-pay

## 2019-12-21 ENCOUNTER — Other Ambulatory Visit: Payer: Self-pay

## 2019-12-22 ENCOUNTER — Other Ambulatory Visit: Payer: Self-pay

## 2019-12-22 ENCOUNTER — Ambulatory Visit (INDEPENDENT_AMBULATORY_CARE_PROVIDER_SITE_OTHER): Payer: Medicare Other | Admitting: Psychology

## 2019-12-22 ENCOUNTER — Encounter: Payer: Self-pay | Admitting: Family Medicine

## 2019-12-22 ENCOUNTER — Ambulatory Visit (INDEPENDENT_AMBULATORY_CARE_PROVIDER_SITE_OTHER): Payer: Medicare Other | Admitting: Family Medicine

## 2019-12-22 VITALS — BP 122/80 | HR 70 | Temp 97.2°F | Resp 16 | Ht 66.0 in | Wt 172.0 lb

## 2019-12-22 DIAGNOSIS — M7989 Other specified soft tissue disorders: Secondary | ICD-10-CM | POA: Diagnosis not present

## 2019-12-22 DIAGNOSIS — F331 Major depressive disorder, recurrent, moderate: Secondary | ICD-10-CM | POA: Diagnosis not present

## 2019-12-22 DIAGNOSIS — G6289 Other specified polyneuropathies: Secondary | ICD-10-CM

## 2019-12-22 DIAGNOSIS — E785 Hyperlipidemia, unspecified: Secondary | ICD-10-CM

## 2019-12-22 DIAGNOSIS — E039 Hypothyroidism, unspecified: Secondary | ICD-10-CM

## 2019-12-22 DIAGNOSIS — R2681 Unsteadiness on feet: Secondary | ICD-10-CM | POA: Diagnosis not present

## 2019-12-22 DIAGNOSIS — R0602 Shortness of breath: Secondary | ICD-10-CM | POA: Diagnosis not present

## 2019-12-22 DIAGNOSIS — D649 Anemia, unspecified: Secondary | ICD-10-CM

## 2019-12-22 MED ORDER — FUROSEMIDE 20 MG PO TABS: ORAL_TABLET | ORAL | 3 refills | Status: DC

## 2019-12-22 NOTE — Patient Instructions (Addendum)
It was good to see you again today, I will be in touch with your labs as soon as possible Please consider having the Shingrix series given at your pharmacy at your convenience  We will be in touch with your labs Please try the furosemide 20 mg one or two a day as needed for your swelling Elevating your legs and continuing your compression stockings may be helpful as well

## 2019-12-22 NOTE — Progress Notes (Addendum)
[facility], [place], [place] [address] [phone]  Date:  12/22/2019   Name:  Mary Buck   DOB:  1936-04-13   MRN:  [id number]  PCP:  Darreld Mclean, MD    Chief Complaint: Leg Swelling (bilateral swelling in legs, sensitive)   History of Present Illness:  Mary Buck is a 84 y.o. very pleasant female patient who presents with the following:  Patient with history of significant skin cancer problems, bladder cancer, spinal stenosis, hypothyroidism, hyperlipidemia, peripheral neuropathy, neurogenic claudication Here today for follow-up visit and with concern of leg swelling She saw my partner Dr. Larose Kells for same just over a week ago-at that time she noted left hip pain and left leg swelling Dr. Larose Kells obtained plain films of hip and also an ultrasound-these were negative for fracture or DVT She is now seen pain management with Bevelyn Ngo recent visit March 25  Diagnosis: Spinal stenosis of lumbar region with neurogenic claudication Chronic pain syndrome Fibromyalgia  Mary Buck is a 84 y.o. female being evaluated for continued management of her chronic pain. She will optimize gabapentin dosing 300 mg in the morning and evening and 600 mg at night. I have also suggested that she optimize tramadol 3 times per day. We will consider moving forward with Vertiflex pending insurance approval. We will have to do this once she is her sling removed from her left shoulder.  Recommended Plan of Care: - Interventional Procedures: - Consider Vertiflex - with her change in insurance - Medical Modalities:  - Continue tramadol 50 mg 3 times daily as needed   Tetanus vaccine appears to be due Covid series is complete  She is being treated for bladder cancer right now as well- her urologist is Dr Jinny Blossom She will see him soon for what sounds like a cystoscopy procedure and intrathecal  chemotherapy  Today Mary Buck notes more significant than usual bilateral lower extremity swelling.  Her legs may feel tight and uncomfortable.  She does have some scarring of both legs from treatment for skin cancer, the left leg underwent fairly extensive radiation treatment in the past.  Her swelling is somewhat better in the morning and can get worse as the day goes on  Significantly, she fell about 1 month ago and fractured her right humerus.  She is wearing an arm sling-this causes her to sleep propped up on several pillows at night in order to keep her arm comfortably positioned  She denies any orthopnea Lab Results  Component Value Date   TSH 0.97 05/02/2019   Wt Readings from Last 3 Encounters:  12/22/19 172 lb (78 kg)  12/12/19 173 lb 4 oz (78.6 kg)  11/22/19 160 lb (72.6 kg)  Weight February 2020, 165 pounds Her weight at home is about 169-she does feel like her weight has come up recently due to inactivity and eating too much, but does not note any dramatic swings in weight No known history of CHF  BP Readings from Last 3 Encounters:  12/22/19 122/80  12/12/19 (!) 166/67  11/22/19 (!) 170/97    Patient Active Problem List   Diagnosis Date Noted  . Osteopenia 05/30/2019  . Pedal edema 01/31/2018  . Squamous cell carcinoma of skin of left lower limb, including hip 12/16/2017  . Plantar fasciitis 07/07/2017  . DDD (degenerative disc disease), lumbar 06/01/2017  . Fibromyalgia 06/01/2017  . Spinal stenosis at L4-L5 level 04/30/2017  .  Peripheral neuropathy 04/30/2017  . Encounter for medication monitoring 03/05/2017  . Bladder cancer ([place]) 08/20/2015  . Cervical stenosis of spinal canal 07/27/2015  . Spinal stenosis of cervical region 07/27/2015  . Spondylolisthesis 07/27/2015  . Hypothyroid 07/05/2015  . Abdominal pain, acute, left upper quadrant 06/28/2015  . Basal cell carcinoma, face 06/28/2015  . Bilateral groin pain 06/28/2015  . Chronic pain 06/28/2015  .  Fatigue 06/28/2015  . Hyperlipidemia 06/28/2015  . Pain in joints 06/28/2015  . Paresthesia of lower lip 06/28/2015  . Pulmonary nodule 06/28/2015  . Pustulosis palmaris et plantaris 06/28/2015  . Ulcer of nose 06/28/2015  . 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  . Mass of parotid gland 03/24/2014    Past Medical History:  Diagnosis Date  . Allergy   . Asthma   . Cancer ([place])   . Cataract   . DDD (degenerative disc disease), lumbar 06/01/2017  . Depression   . Heart murmur   . Hyperlipidemia   . Peripheral neuropathy 04/30/2017  . Thyroid disease     Past Surgical History:  Procedure Laterality Date  . bladder cancer  2013  . CARPAL TUNNEL RELEASE  2015  . CATARACT EXTRACTION Bilateral 2012  . CHOLECYSTECTOMY    . SALIVARY GLAND SURGERY Left 2015   benign  . TONSILLECTOMY AND ADENOIDECTOMY  1943    Social History   Tobacco Use  . Smoking status: Former Smoker    Quit date: 2015    Years since quitting: 6.2  . Smokeless tobacco: Never Used  Substance Use Topics  . Alcohol use: No  . Drug use: No    Family History  Problem Relation Age of Onset  . Hyperlipidemia Father   . Stroke Father     Allergies  Allergen Reactions  . Cephalexin     Other reaction(s): unsure  . Contrast Media [Iodinated Diagnostic Agents]   . Latex     Other reaction(s): blistering, redness   . Mirtazapine     Other reaction(s): nausea  . Penicillins   . Sulfa Antibiotics   . Sulfur Other (See Comments)  . Acitretin Rash    Medication list has been reviewed and updated.  Current Outpatient Medications on File Prior to Visit  Medication Sig Dispense Refill  . acitretin (SORIATANE) 10 MG capsule Take 10 mg by mouth 3 (three) times a week.    . Cholecalciferol (VITAMIN D-3 PO) Take 1,000 Units by mouth daily.    Marland Kitchen gabapentin (NEURONTIN) 300 MG capsule Take 1 capsule (300 mg total) by mouth 3 (three) times daily. (Patient taking differently: Take 300 mg by mouth 3 (three) times daily. Third  dose is 600 mg) 90 capsule 4  . lactobacillus acidophilus (BACID) TABS tablet Take 1 tablet by mouth daily.     . lansoprazole (PREVACID) 30 MG capsule TAKE ONE CAPSULE BY MOUTH TWICE DAILY BEFORE A MEAL. 180 capsule 3  . levothyroxine (SYNTHROID) 88 MCG tablet TAKE 1 TABLET(88 MCG) BY MOUTH DAILY BEFORE BREAKFAST 90 tablet 1  . misoprostol (CYTOTEC) 100 MCG tablet     . simvastatin (ZOCOR) 20 MG tablet TAKE 1 TABLET BY MOUTH DAILY 90 tablet 1  . traMADol (ULTRAM) 50 MG tablet Take 50 mg by mouth 3 (three) times daily as needed.    . triamcinolone cream (KENALOG) 0.1 % APPLY TOPICALLY TO THE AFFECTED AREA TWICE DAILY AS NEEDED FOR SPOTS ON YOUR SKIN 45 g 1   No current facility-administered medications on file prior to visit.    Review  of Systems:  As per HPI- otherwise negative.   Physical Examination: Vitals:   12/22/19 1352 12/22/19 1414  BP: (!) 156/77 122/80  Pulse: 70   Resp: 16   Temp: (!) 97.2 F (36.2 C)   SpO2: 97%    Vitals:   12/22/19 1352  Weight: 172 lb (78 kg)  Height: '5\' 6"'  (1.676 m)   Body mass index is 27.76 kg/m. Ideal Body Weight: Weight in (lb) to have BMI = 25: 154.6  GEN: no acute distress.  Looks well, her normal self.  Accompanied by her partner Mickie HEENT: Atraumatic, Normocephalic.  Ears and Nose: No external deformity. CV: RRR, No M/G/R. No JVD. No thrill. No extra heart sounds. PULM: CTA B, no wheezes, crackles, rhonchi. No retractions. No resp. distress. No accessory muscle use. ABD: S, NT, ND, +BS. No rebound. No HSM. She is wearing a sling on her right arm EXTR: No c/c PSYCH: Normally interactive. Conversant.  Both feet appear normal, she has strong pulses and minimal swelling of the feet.  She has 1+ swelling of her bilateral calves slightly worse on the left, though the left calf swelling is limited by an area of scarring   Assessment and Plan: Other polyneuropathy  Gait instability - Plan: Comprehensive metabolic panel,  CBC  Acquired hypothyroidism - Plan: TSH  Hyperlipidemia, unspecified hyperlipidemia type  Swelling of lower extremity - Plan: furosemide (LASIX) 20 MG tablet, B Nat Peptide  Shortness of breath - Plan: B Nat Peptide  Here today for follow-up visit She has noted worsening of chronic lower extremity swelling over the past few weeks.  This is likely due to venous insufficiency.  This may be due to her recent right humerus fracture; this is limiting her mobility and also causes her to sleep propped up.  I encouraged her to elevate her legs higher than her heart for a period of time when possible during the day, and to attempt to elevate her legs at night if she can Prescribed Lasix which she may use as needed-advised her to use 1 or 2 doses of 20 mg daily, can adjust as needed for swelling level BNP to help rule out CHF Labs pending as above  Moderate medical decision making today  This visit occurred during the SARS-CoV-2 public health emergency.  Safety protocols were in place, including screening questions prior to the visit, additional usage of staff PPE, and extensive cleaning of exam room while observing appropriate contact time as indicated for disinfecting solutions.    Signed Lamar Blinks, MD  Received her labs as below, 4/10  Message to patient Results for orders placed or performed in visit on 12/22/19  TSH  Result Value Ref Range   TSH 2.40 0.35 - 4.50 uIU/mL  Comprehensive metabolic panel  Result Value Ref Range   Sodium 142 135 - 145 mEq/L   Potassium 4.5 3.5 - 5.1 mEq/L   Chloride 104 96 - 112 mEq/L   CO2 32 19 - 32 mEq/L   Glucose, Bld 80 70 - 99 mg/dL   BUN 12 6 - 23 mg/dL   Creatinine, Ser 0.74 0.40 - 1.20 mg/dL   Total Bilirubin 0.3 0.2 - 1.2 mg/dL   Alkaline Phosphatase 105 39 - 117 U/L   AST 14 0 - 37 U/L   ALT 8 0 - 35 U/L   Total Protein 6.4 6.0 - 8.3 g/dL   Albumin 3.6 3.5 - 5.2 g/dL   GFR 75.27 >60.00 mL/min   Calcium 8.6 8.4 -  10.5 mg/dL  CBC   Result Value Ref Range   WBC 4.6 4.0 - 10.5 K/uL   RBC 3.32 (L) 3.87 - 5.11 Mil/uL   Platelets 208.0 150.0 - 400.0 K/uL   Hemoglobin 10.0 (L) 12.0 - 15.0 g/dL   HCT 30.4 (L) 36.0 - 46.0 %   MCV 91.6 78.0 - 100.0 fl   MCHC 32.7 30.0 - 36.0 g/dL   RDW 16.4 (H) 11.5 - 15.5 %  B Nat Peptide  Result Value Ref Range   Pro B Natriuretic peptide (BNP) 94.0 0.0 - 100.0 pg/mL     Your BNP is fine, certainly no sign of heart failure Metabolic profile and electrolytes are normal-no sign of renal failure either Thyroid normal The only thing that concerns me in your labs is an increase in your mild anemia; your hemoglobin and hematocrit are lower than they were previously  Have you noticed any bleeding, any blood in stools or dark tarry stools?  I am going to mail you a stool testing kit, to screen for any blood in your stool  Otherwise, I would like to repeat your blood counts in 1 to 2 months-we will place this order for you if you would like to come for a lab visit only  Please let me know how you respond to the Lasix for swelling

## 2019-12-23 LAB — COMPREHENSIVE METABOLIC PANEL
ALT: 8 U/L (ref 0–35)
AST: 14 U/L (ref 0–37)
Albumin: 3.6 g/dL (ref 3.5–5.2)
Alkaline Phosphatase: 105 U/L (ref 39–117)
BUN: 12 mg/dL (ref 6–23)
CO2: 32 mEq/L (ref 19–32)
Calcium: 8.6 mg/dL (ref 8.4–10.5)
Chloride: 104 mEq/L (ref 96–112)
Creatinine, Ser: 0.74 mg/dL (ref 0.40–1.20)
GFR: 75.27 mL/min (ref >60.00)
Glucose, Bld: 80 mg/dL (ref 70–99)
Potassium: 4.5 mEq/L (ref 3.5–5.1)
Sodium: 142 mEq/L (ref 135–145)
Total Bilirubin: 0.3 mg/dL (ref 0.2–1.2)
Total Protein: 6.4 g/dL (ref 6.0–8.3)

## 2019-12-23 LAB — BRAIN NATRIURETIC PEPTIDE: Pro B Natriuretic peptide (BNP): 94 pg/mL (ref 0.0–100.0)

## 2019-12-23 LAB — CBC
HCT: 30.4 % — ABNORMAL LOW (ref 36.0–46.0)
Hemoglobin: 10 g/dL — ABNORMAL LOW (ref 12.0–15.0)
MCHC: 32.7 g/dL (ref 30.0–36.0)
MCV: 91.6 fl (ref 78.0–100.0)
Platelets: 208 10*3/uL (ref 150.0–400.0)
RBC: 3.32 Mil/uL — ABNORMAL LOW (ref 3.87–5.11)
RDW: 16.4 % — ABNORMAL HIGH (ref 11.5–15.5)
WBC: 4.6 10*3/uL (ref 4.0–10.5)

## 2019-12-23 LAB — TSH: TSH: 2.4 u[IU]/mL (ref 0.35–4.50)

## 2019-12-24 ENCOUNTER — Encounter: Payer: Self-pay | Admitting: Family Medicine

## 2019-12-24 NOTE — Addendum Note (Signed)
Addended by: Lamar Blinks C on: 12/24/2019 07:05 AM   Modules accepted: Orders

## 2019-12-26 ENCOUNTER — Telehealth: Payer: Self-pay

## 2019-12-26 NOTE — Telephone Encounter (Signed)
Ifob mailed to patient.  °

## 2019-12-26 NOTE — Telephone Encounter (Signed)
-----  Message from Darreld Mclean, MD sent at 12/24/2019  6:58 AM EDT ----- Elease Etienne, please mail her a FOBT kit, I have ordered it  Thank you

## 2020-01-05 ENCOUNTER — Ambulatory Visit (INDEPENDENT_AMBULATORY_CARE_PROVIDER_SITE_OTHER): Payer: Medicare Other | Admitting: Psychology

## 2020-01-05 DIAGNOSIS — F331 Major depressive disorder, recurrent, moderate: Secondary | ICD-10-CM | POA: Diagnosis not present

## 2020-01-08 ENCOUNTER — Encounter: Payer: Self-pay | Admitting: Family Medicine

## 2020-01-16 ENCOUNTER — Telehealth: Payer: Self-pay

## 2020-01-16 NOTE — Telephone Encounter (Signed)
Patient called in to see if the nurse could mail out an Bio hazard bag for her testing Kit that was mailed out to her. The kit did not come with a Bio- hazard bag. Per the patient she would like a follow up call once this is done at [phone]

## 2020-01-16 NOTE — Telephone Encounter (Signed)
Mailed bag to patient. Patient aware of bag being mailed to her.

## 2020-01-24 ENCOUNTER — Other Ambulatory Visit: Payer: Self-pay | Admitting: Family Medicine

## 2020-01-24 ENCOUNTER — Other Ambulatory Visit (INDEPENDENT_AMBULATORY_CARE_PROVIDER_SITE_OTHER): Payer: Medicare Other

## 2020-01-24 DIAGNOSIS — Z1211 Encounter for screening for malignant neoplasm of colon: Secondary | ICD-10-CM

## 2020-01-24 LAB — FECAL OCCULT BLOOD, IMMUNOCHEMICAL: Fecal Occult Bld: NEGATIVE

## 2020-01-25 ENCOUNTER — Encounter: Payer: Self-pay | Admitting: Family Medicine

## 2020-01-25 ENCOUNTER — Other Ambulatory Visit: Payer: Self-pay | Admitting: Family Medicine

## 2020-01-25 DIAGNOSIS — D649 Anemia, unspecified: Secondary | ICD-10-CM

## 2020-01-27 ENCOUNTER — Ambulatory Visit (INDEPENDENT_AMBULATORY_CARE_PROVIDER_SITE_OTHER): Payer: Medicare Other | Admitting: Psychology

## 2020-01-27 DIAGNOSIS — F331 Major depressive disorder, recurrent, moderate: Secondary | ICD-10-CM

## 2020-02-06 ENCOUNTER — Telehealth: Payer: Self-pay

## 2020-02-06 NOTE — Telephone Encounter (Signed)
Patient called in to speak with the nurse about an infection she has in her nose. The patient would like to have a follow up call at [phone] to discuss further options. The patient wanted to schedule an appointment today 02/06/2020  but Dr, Lorelei Pont schedule is full at 100% I [place] Janalyn Harder offered to make  An appointment for [facility]  02/08/2020 at 1:20 pm patient denied the appointment time. Just a FYI to the patient was made aware that  Dr. Lorelei Pont is not here on Tuesday.

## 2020-02-07 NOTE — Progress Notes (Deleted)
[place] at [facility] [address] [place], [place] [address] [phone]  Date:  02/08/2020   Name:  Mary Buck   DOB:  [birth year]   MRN:  [id number]  PCP:  Darreld Mclean, MD    Chief Complaint: No chief complaint on file.   History of Present Illness:  Mary Buck is a 84 y.o. very pleasant female patient who presents with the following:  Patient with history of significant skin cancer problems, bladder cancer, spinal stenosis, hypothyroidism, hyperlipidemia, peripheral neuropathy, neurogenic claudication Last seen by myself in April for a follow-up visit  Here today with concern of a nasal infection  Patient Active Problem List   Diagnosis Date Noted  . Osteopenia 05/30/2019  . Pedal edema 01/31/2018  . Squamous cell carcinoma of skin of left lower limb, including hip 12/16/2017  . Plantar fasciitis 07/07/2017  . DDD (degenerative disc disease), lumbar 06/01/2017  . Fibromyalgia 06/01/2017  . Spinal stenosis at L4-L5 level 04/30/2017  . Peripheral neuropathy 04/30/2017  . Encounter for medication monitoring 03/05/2017  . Bladder cancer ([place]) 08/20/2015  . Cervical stenosis of spinal canal 07/27/2015  . Spinal stenosis of cervical region 07/27/2015  . Spondylolisthesis 07/27/2015  . Hypothyroid 07/05/2015  . Abdominal pain, acute, left upper quadrant 06/28/2015  . Basal cell carcinoma, face 06/28/2015  . Bilateral groin pain 06/28/2015  . Chronic pain 06/28/2015  . Fatigue 06/28/2015  . Hyperlipidemia 06/28/2015  . Pain in joints 06/28/2015  . Paresthesia of lower lip 06/28/2015  . Pulmonary nodule 06/28/2015  . Pustulosis palmaris et plantaris 06/28/2015  . Ulcer of nose 06/28/2015  . 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  . Mass of parotid gland 03/24/2014    Past Medical History:  Diagnosis Date  . Allergy   . Asthma   . Cancer ([place])   . Cataract   . DDD (degenerative disc disease), lumbar  06/01/2017  . Depression   . Heart murmur   . Hyperlipidemia   . Peripheral neuropathy 04/30/2017  . Thyroid disease     Past Surgical History:  Procedure Laterality Date  . bladder cancer  2013  . CARPAL TUNNEL RELEASE  2015  . CATARACT EXTRACTION Bilateral 2012  . CHOLECYSTECTOMY    . SALIVARY GLAND SURGERY Left 2015   benign  . TONSILLECTOMY AND ADENOIDECTOMY  1943    Social History   Tobacco Use  . Smoking status: Former Smoker    Quit date: 2015    Years since quitting: 6.4  . Smokeless tobacco: Never Used  Substance Use Topics  . Alcohol use: No  . Drug use: No    Family History  Problem Relation Age of Onset  . Hyperlipidemia Father   . Stroke Father     Allergies  Allergen Reactions  . Cephalexin     Other reaction(s): unsure  . Contrast Media [Iodinated Diagnostic Agents]   . Latex     Other reaction(s): blistering, redness   . Mirtazapine     Other reaction(s): nausea  . Penicillins   . Sulfa Antibiotics   . Sulfur Other (See Comments)  . Acitretin Rash    Medication list has been reviewed and updated.  Current Outpatient Medications on File Prior to Visit  Medication Sig Dispense Refill  . acitretin (SORIATANE) 10 MG capsule Take 10 mg by mouth 3 (three) times a week.    . Cholecalciferol (VITAMIN D-3 PO) Take 1,000 Units by mouth daily.    Marland Kitchen  furosemide (LASIX) 20 MG tablet Take 1 or 2 daily as needed for swelling in your legs 40 tablet 3  . gabapentin (NEURONTIN) 300 MG capsule Take 1 capsule (300 mg total) by mouth 3 (three) times daily. (Patient taking differently: Take 300 mg by mouth 3 (three) times daily. Third dose is 600 mg) 90 capsule 4  . lactobacillus acidophilus (BACID) TABS tablet Take 1 tablet by mouth daily.     . lansoprazole (PREVACID) 30 MG capsule TAKE ONE CAPSULE BY MOUTH TWICE DAILY BEFORE A MEAL. 180 capsule 3  . levothyroxine (SYNTHROID) 88 MCG tablet TAKE 1 TABLET(88 MCG) BY MOUTH DAILY BEFORE BREAKFAST 90 tablet 1  .  misoprostol (CYTOTEC) 100 MCG tablet     . simvastatin (ZOCOR) 20 MG tablet TAKE 1 TABLET BY MOUTH DAILY 90 tablet 1  . traMADol (ULTRAM) 50 MG tablet Take 50 mg by mouth 3 (three) times daily as needed.    . triamcinolone cream (KENALOG) 0.1 % APPLY TOPICALLY TO THE AFFECTED AREA TWICE DAILY AS NEEDED FOR SPOTS ON YOUR SKIN 45 g 1   No current facility-administered medications on file prior to visit.    Review of Systems:  As per HPI- otherwise negative.   Physical Examination: There were no vitals filed for this visit. There were no vitals filed for this visit. There is no height or weight on file to calculate BMI. Ideal Body Weight:    GEN: no acute distress. HEENT: Atraumatic, Normocephalic.  Ears and Nose: No external deformity. CV: RRR, No M/G/R. No JVD. No thrill. No extra heart sounds. PULM: CTA B, no wheezes, crackles, rhonchi. No retractions. No resp. distress. No accessory muscle use. ABD: S, NT, ND, +BS. No rebound. No HSM. EXTR: No c/c/e PSYCH: Normally interactive. Conversant.    Assessment and Plan: *** This visit occurred during the SARS-CoV-2 public health emergency.  Safety protocols were in place, including screening questions prior to the visit, additional usage of staff PPE, and extensive cleaning of exam room while observing appropriate contact time as indicated for disinfecting solutions.    Signed Lamar Blinks, MD

## 2020-02-07 NOTE — Telephone Encounter (Signed)
Scheduled patient appointment to see Dr. Lorelei Pont tomorrow.

## 2020-02-08 ENCOUNTER — Telehealth (INDEPENDENT_AMBULATORY_CARE_PROVIDER_SITE_OTHER): Payer: Medicare Other | Admitting: Family Medicine

## 2020-02-08 ENCOUNTER — Ambulatory Visit: Payer: Commercial Indemnity | Admitting: Family Medicine

## 2020-02-08 ENCOUNTER — Other Ambulatory Visit: Payer: Self-pay

## 2020-02-08 VITALS — BP 102/63 | HR 72 | Resp 17 | Ht 66.0 in | Wt 172.0 lb

## 2020-02-08 DIAGNOSIS — J340 Abscess, furuncle and carbuncle of nose: Secondary | ICD-10-CM | POA: Diagnosis not present

## 2020-02-08 MED ORDER — CLINDAMYCIN HCL 300 MG PO CAPS: 300.0000 mg | ORAL_CAPSULE | Freq: Three times a day (TID) | ORAL | 0 refills | Status: DC

## 2020-02-08 NOTE — Progress Notes (Addendum)
[place] at [facility] [address] [place], [place] [address] [phone]  Date:  02/08/2020   Name:  Mary Buck   DOB:  Dec 26, 1935   MRN:  [id number]  PCP:  Darreld Mclean, MD    Chief Complaint: Nose Infection   History of Present Illness:  Mary Buck is a 84 y.o. very pleasant female patient who presents with the following: Virtual visit today for concern of possible nasal infection Pt location is office, provider is at home.  Pt ID confirmed with 2 factors, she gives consent for virtual visit today  Patient with history of significant skin cancer problems, bladder cancer, spinal stenosis, hypothyroidism, hyperlipidemia, peripheral neuropathy, neurogenic claudication  She also broke her right humerus not long ago- she is seeing ortho and doing PT  She also just recently went underwent treatment for recurrent bladder cancer  She has noted the right side of her nose is red and swollen She is using topical mupirocin It has been going on for a few weeks- not going away and it does seem to be more painful No fever or chills No pus drainage She is able to breathe through the left side of her nose- the right side is stuffy She is not taking any oral abx at this time  She is taking gabapentin and tramadol for her chronic back pain She is hoping to have a back surgery but her surgeon is leaving his office -she is not sure if one of his partners can help her with this  Patient Active Problem List   Diagnosis Date Noted  . Osteopenia 05/30/2019  . Pedal edema 01/31/2018  . Squamous cell carcinoma of skin of left lower limb, including hip 12/16/2017  . Plantar fasciitis 07/07/2017  . DDD (degenerative disc disease), lumbar 06/01/2017  . Fibromyalgia 06/01/2017  . Spinal stenosis at L4-L5 level 04/30/2017  . Peripheral neuropathy 04/30/2017  . Encounter for medication monitoring 03/05/2017  . Bladder cancer ([place])  08/20/2015  . Cervical stenosis of spinal canal 07/27/2015  . Spinal stenosis of cervical region 07/27/2015  . Spondylolisthesis 07/27/2015  . Hypothyroid 07/05/2015  . Abdominal pain, acute, left upper quadrant 06/28/2015  . Basal cell carcinoma, face 06/28/2015  . Bilateral groin pain 06/28/2015  . Chronic pain 06/28/2015  . Fatigue 06/28/2015  . Hyperlipidemia 06/28/2015  . Pain in joints 06/28/2015  . Paresthesia of lower lip 06/28/2015  . Pulmonary nodule 06/28/2015  . Pustulosis palmaris et plantaris 06/28/2015  . Ulcer of nose 06/28/2015  . 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  . Mass of parotid gland 03/24/2014    Past Medical History:  Diagnosis Date  . Allergy   . Asthma   . Cancer ([place])   . Cataract   . DDD (degenerative disc disease), lumbar 06/01/2017  . Depression   . Heart murmur   . Hyperlipidemia   . Peripheral neuropathy 04/30/2017  . Thyroid disease     Past Surgical History:  Procedure Laterality Date  . bladder cancer  2013  . CARPAL TUNNEL RELEASE  2015  . CATARACT EXTRACTION Bilateral 2012  . CHOLECYSTECTOMY    . SALIVARY GLAND SURGERY Left 2015   benign  . TONSILLECTOMY AND ADENOIDECTOMY  1943    Social History   Tobacco Use  . Smoking status: Former Smoker    Quit date: 2015    Years since quitting: 6.4  . Smokeless tobacco: Never Used  Substance Use Topics  .  Alcohol use: No  . Drug use: No    Family History  Problem Relation Age of Onset  . Hyperlipidemia Father   . Stroke Father     Allergies  Allergen Reactions  . Cephalexin     Other reaction(s): unsure  . Contrast Media [Iodinated Diagnostic Agents]   . Latex     Other reaction(s): blistering, redness   . Mirtazapine     Other reaction(s): nausea  . Penicillins   . Sulfa Antibiotics   . Sulfur Other (See Comments)  . Acitretin Rash    Medication list has been reviewed and updated.  Current Outpatient Medications on File Prior to Visit  Medication Sig Dispense Refill  .  acitretin (SORIATANE) 10 MG capsule Take 10 mg by mouth 3 (three) times a week.    . Cholecalciferol (VITAMIN D-3 PO) Take 1,000 Units by mouth daily.    . furosemide (LASIX) 20 MG tablet Take 1 or 2 daily as needed for swelling in your legs 40 tablet 3  . gabapentin (NEURONTIN) 300 MG capsule Take 1 capsule (300 mg total) by mouth 3 (three) times daily. (Patient taking differently: Take 300 mg by mouth 3 (three) times daily. Third dose is 600 mg) 90 capsule 4  . lactobacillus acidophilus (BACID) TABS tablet Take 1 tablet by mouth daily.     . lansoprazole (PREVACID) 30 MG capsule TAKE ONE CAPSULE BY MOUTH TWICE DAILY BEFORE A MEAL. 180 capsule 3  . levothyroxine (SYNTHROID) 88 MCG tablet TAKE 1 TABLET(88 MCG) BY MOUTH DAILY BEFORE BREAKFAST 90 tablet 1  . misoprostol (CYTOTEC) 100 MCG tablet     . simvastatin (ZOCOR) 20 MG tablet TAKE 1 TABLET BY MOUTH DAILY 90 tablet 1  . traMADol (ULTRAM) 50 MG tablet Take 50 mg by mouth 3 (three) times daily as needed.    . triamcinolone cream (KENALOG) 0.1 % APPLY TOPICALLY TO THE AFFECTED AREA TWICE DAILY AS NEEDED FOR SPOTS ON YOUR SKIN 45 g 1   No current facility-administered medications on file prior to visit.    Review of Systems:  As per HPI- otherwise negative.   Physical Examination: Vitals:   02/08/20 1436  BP: 102/63  Pulse: 72  Resp: 17  SpO2: 99%   Vitals:   02/08/20 1436  Weight: 172 lb (78 kg)  Height: 5\' 6"  (1.676 m)   Body mass index is 27.76 kg/m. Ideal Body Weight: Weight in (lb) to have BMI = 25: 154.6  BP Readings from Last 3 Encounters:  02/08/20 102/63  12/22/19 122/80  12/12/19 (!) 166/67   Patient observed her video monitor.  She looks well, her normal self.  There is some mild redness and inflammation at the soft tissue on the right side of her nose. No fluctuance or area that needs to be drained is observed No redness or darkening around the eyes No pain with movement of extraocular muscles  Assessment  and Plan: Cellulitis of external nose - Plan: clindamycin (CLEOCIN) 300 MG capsule  Patient here today with cellulitis of external nose.  Chose clindamycin for treatment as she is penicillin and sulfa allergic, and tetracyclines are contraindicated with her acitretin Encouraged her to continue warm compresses, or cold packs as helpful.  She may use her topical mupirocin as desired I have asked her to let me know if not improving over the next several days, sooner if getting worse.  She agrees Video monitor used for duration of visit today Moderate medical decision making today I made  her a follow-up appointment in about 10 days.  She is also noticed some vague, longstanding sense of being off balance and does not feel as strong as usual.  She is not able to come in next week, so an appointment is made for Monday the seventh.  Signed Lamar Blinks, MD

## 2020-02-09 ENCOUNTER — Ambulatory Visit: Payer: Commercial Indemnity | Admitting: Family Medicine

## 2020-02-15 ENCOUNTER — Ambulatory Visit (INDEPENDENT_AMBULATORY_CARE_PROVIDER_SITE_OTHER): Payer: Medicare Other | Admitting: Family Medicine

## 2020-02-15 ENCOUNTER — Encounter: Payer: Self-pay | Admitting: Family Medicine

## 2020-02-15 ENCOUNTER — Other Ambulatory Visit: Payer: Self-pay

## 2020-02-15 VITALS — BP 124/68 | HR 66 | Temp 97.9°F | Resp 18 | Ht 66.0 in | Wt 172.0 lb

## 2020-02-15 DIAGNOSIS — G6289 Other specified polyneuropathies: Secondary | ICD-10-CM

## 2020-02-15 DIAGNOSIS — R2681 Unsteadiness on feet: Secondary | ICD-10-CM | POA: Diagnosis not present

## 2020-02-15 DIAGNOSIS — J340 Abscess, furuncle and carbuncle of nose: Secondary | ICD-10-CM | POA: Diagnosis not present

## 2020-02-15 NOTE — Progress Notes (Signed)
[facility] [address] [place], [place] [address] [phone]  Date:  02/15/2020   Name:  Mary Buck   DOB:  [birth year]   MRN:  [id number]  PCP:  Darreld Mclean, MD    Chief Complaint: Allergic Reaction (clindamyocin, abdominal pain improving, dizziness)   History of Present Illness:  Mary Buck is a 84 y.o. very pleasant female patient who presents with the following:  Patient with history of significant skin cancer problems, bladder cancer, spinal stenosis, hypothyroidism, hyperlipidemia, peripheral neuropathy, neurogenic claudication  She also broke her right humerus not long ago- she is seeing ortho and doing PT. Now released by her orthopedist as of yesterday. Her arm is improved but not 100% yet She also just recently went underwent treatment for recurrent bladder cancer  Last visit with myself was a virtual visit about 1 week ago.  At that time she had complaint of swelling and discomfort in the right side of her nose.  She was using topical mupirocin We started a course of clindamycin at that time She also had a complaint of vague, longstanding sense of being off balance.   She stopped the clindamycin after about 3 days due to stomach cramping.  Mild diarrhea off and on.  Her stomach is improved but not 100%.  Not having diarrhea at this time.  Her nose does seem to be healing  She has history of claudication and neuropathy She notes that the pain in her legs is now going up to her waist  She notes that she tends to feel tired and her balance is bad.   She notes that she tends to have a flat-footed gait, she feels unsteady on her feet and worries that she might fall.  This is worse on uneven surfaces  She did see neurology for this issue about a year ago, they felt that she had multifactorial gait instability as opposed to 1 distinct entity  She is seen by [facility] pain management for spinal  stenosis.  She is seeing her urologist next week to make sure she is cleared from her bladder cancer  Dr Evelene Croon is her surgeon with pain management- he will be leaving the practice soon but not until July.  We hope she can get her procedure - Vertiflex- done prior to Dr Evelene Croon leaving the office  She actually just got a call from Dr. Evelene Croon' office today to schedule a follow-up visit  She is seeing Delrae Alfred at [facility] for her am- I will have her continue to see him for gait and balance training    Patient Active Problem List   Diagnosis Date Noted  . Osteopenia 05/30/2019  . Pedal edema 01/31/2018  . Squamous cell carcinoma of skin of left lower limb, including hip 12/16/2017  . Plantar fasciitis 07/07/2017  . DDD (degenerative disc disease), lumbar 06/01/2017  . Fibromyalgia 06/01/2017  . Spinal stenosis at L4-L5 level 04/30/2017  . Peripheral neuropathy 04/30/2017  . Encounter for medication monitoring 03/05/2017  . Bladder cancer ([place]) 08/20/2015  . Cervical stenosis of spinal canal 07/27/2015  . Spinal stenosis of cervical region 07/27/2015  . Spondylolisthesis 07/27/2015  . Hypothyroid 07/05/2015  . Abdominal pain, acute, left upper quadrant 06/28/2015  . Basal cell carcinoma, face 06/28/2015  . Bilateral groin pain 06/28/2015  . Chronic pain 06/28/2015  . Fatigue 06/28/2015  . Hyperlipidemia 06/28/2015  . Pain in joints 06/28/2015  . Paresthesia  of lower lip 06/28/2015  . Pulmonary nodule 06/28/2015  . Pustulosis palmaris et plantaris 06/28/2015  . Ulcer of nose 06/28/2015  . 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  . Mass of parotid gland 03/24/2014    Past Medical History:  Diagnosis Date  . Allergy   . Asthma   . Cancer ([place])   . Cataract   . DDD (degenerative disc disease), lumbar 06/01/2017  . Depression   . Heart murmur   . Hyperlipidemia   . Peripheral neuropathy 04/30/2017  . Thyroid disease     Past Surgical History:  Procedure Laterality Date  . bladder cancer   2013  . CARPAL TUNNEL RELEASE  2015  . CATARACT EXTRACTION Bilateral 2012  . CHOLECYSTECTOMY    . SALIVARY GLAND SURGERY Left 2015   benign  . TONSILLECTOMY AND ADENOIDECTOMY  1943    Social History   Tobacco Use  . Smoking status: Former Smoker    Quit date: 2015    Years since quitting: 6.4  . Smokeless tobacco: Never Used  Substance Use Topics  . Alcohol use: No  . Drug use: No    Family History  Problem Relation Age of Onset  . Hyperlipidemia Father   . Stroke Father     Allergies  Allergen Reactions  . Cephalexin     Other reaction(s): unsure  . Contrast Media [Iodinated Diagnostic Agents]   . Latex     Other reaction(s): blistering, redness   . Mirtazapine     Other reaction(s): nausea  . Penicillins   . Sulfa Antibiotics   . Sulfur Other (See Comments)  . Acitretin Rash    Medication list has been reviewed and updated.  Current Outpatient Medications on File Prior to Visit  Medication Sig Dispense Refill  . acitretin (SORIATANE) 10 MG capsule Take 10 mg by mouth 3 (three) times a week.    . Cholecalciferol (VITAMIN D-3 PO) Take 1,000 Units by mouth daily.    . clindamycin (CLEOCIN) 300 MG capsule Take 1 capsule (300 mg total) by mouth 3 (three) times daily. 21 capsule 0  . furosemide (LASIX) 20 MG tablet Take 1 or 2 daily as needed for swelling in your legs 40 tablet 3  . gabapentin (NEURONTIN) 300 MG capsule Take 1 capsule (300 mg total) by mouth 3 (three) times daily. (Patient taking differently: Take 300 mg by mouth 3 (three) times daily. Third dose is 600 mg) 90 capsule 4  . lactobacillus acidophilus (BACID) TABS tablet Take 1 tablet by mouth daily.     . lansoprazole (PREVACID) 30 MG capsule TAKE ONE CAPSULE BY MOUTH TWICE DAILY BEFORE A MEAL. 180 capsule 3  . levothyroxine (SYNTHROID) 88 MCG tablet TAKE 1 TABLET(88 MCG) BY MOUTH DAILY BEFORE BREAKFAST 90 tablet 1  . misoprostol (CYTOTEC) 100 MCG tablet     . simvastatin (ZOCOR) 20 MG tablet TAKE 1  TABLET BY MOUTH DAILY 90 tablet 1  . traMADol (ULTRAM) 50 MG tablet Take 50 mg by mouth 3 (three) times daily as needed.    . triamcinolone cream (KENALOG) 0.1 % APPLY TOPICALLY TO THE AFFECTED AREA TWICE DAILY AS NEEDED FOR SPOTS ON YOUR SKIN 45 g 1   No current facility-administered medications on file prior to visit.    Review of Systems:  As per HPI- otherwise negative.   Physical Examination: Vitals:   02/15/20 1109  BP: 124/68  Pulse: 66  Resp: 18  Temp: 97.9 F (36.6 C)  SpO2: 97%   Vitals:  02/15/20 1109  Weight: 172 lb (78 kg)  Height: 5\' 6"  (1.676 m)   Body mass index is 27.76 kg/m. Ideal Body Weight: Weight in (lb) to have BMI = 25: 154.6  GEN: no acute distress.  Minimal, looks well HEENT: Atraumatic, Normocephalic.   Bilateral TM wnl, oropharynx normal.  PEERL,EOMI.   her nose appears normal, there is no tenderness and I do not appreciate any distinct cellulitis at this time Ears and Nose: No external deformity. CV: RRR, No M/G/R. No JVD. No thrill. No extra heart sounds. PULM: CTA B, no wheezes, crackles, rhonchi. No retractions. No resp. distress. No accessory muscle use. ABD: S, NT, ND, +BS. No rebound. No HSM. EXTR: No c/c/e PSYCH: Normally interactive. Conversant.  Gait is not immediately abnormal for age.  However, she does tend to please her entire foot flat on the ground each step, as though she is uncertain of her footing  Assessment and Plan: Cellulitis of external nose  Other polyneuropathy  Gait instability   Here today for a follow-up visit.  We recently treated her with antibiotics for cellulitis of her nose.  She took clindamycin for about 3 days, then stopped due to side effects.  However, her nose does look much better and I think we are okay to stop antibiotic therapy at this time.  She will let me know if her symptoms get worse again We discussed her gait instability-this is probably due to aging, neuropathy, claudication, as well as  extensive radiation to her lower legs due to superficial skin cancer.  She likes her current physical therapist and would like to continue seeing him for gait training.  I gave her a letter to him to this effect.  She is also seeing Dr. Evelene Croon soon, we hope she will be able to have a spine procedure which may help with neuropathy.  Otherwise asked her to see me in 3 months for follow-up, she will let me know if any concerns in the meantime This visit occurred during the SARS-CoV-2 public health emergency.  Safety protocols were in place, including screening questions prior to the visit, additional usage of staff PPE, and extensive cleaning of exam room while observing appropriate contact time as indicated for disinfecting solutions.    Signed Lamar Blinks, MD

## 2020-02-15 NOTE — Patient Instructions (Addendum)
I think at this time we can stop antibiotics for your nose  Let me know if your nose does not continue to improve   Please schedule a visit with Dr Evelene Croon asap- I would love for you to get the Vertiflex prior to his leaving his practice if you can.  We will also have you continue PT with your current therapist to work on balance and gait  Please see me in 3 months, take care

## 2020-02-20 ENCOUNTER — Ambulatory Visit: Payer: Commercial Indemnity | Admitting: Family Medicine

## 2020-02-21 ENCOUNTER — Ambulatory Visit: Payer: Medicare Other | Admitting: Psychology

## 2020-02-22 ENCOUNTER — Telehealth: Payer: Self-pay | Admitting: Family Medicine

## 2020-02-22 MED ORDER — SIMVASTATIN 20 MG PO TABS: 20.0000 mg | ORAL_TABLET | Freq: Every day | ORAL | 3 refills | Status: DC

## 2020-02-22 NOTE — Telephone Encounter (Signed)
Refill sent, please advise on PT referral.

## 2020-02-22 NOTE — Telephone Encounter (Signed)
Medication:simvastatin (ZOCOR) 20 MG table Patient is also requesting for a Physical Therapy referral. She states she needs PT to help with her Balance   Has the patient contacted their pharmacy? No. (If no, request that the patient contact the pharmacy for the refill.) (If yes, when and what did the pharmacy advise?)  Preferred Pharmacy (with phone number or street name):  [id number] - [place], [place] - [address]  [address] [place], [place]  [address]  Phone:  [phone] Fax:  [phone]  Agent: Please be advised that RX refills may take up to 3 business days. We ask that you follow-up with your pharmacy.

## 2020-02-23 ENCOUNTER — Encounter: Payer: Self-pay | Admitting: Family Medicine

## 2020-02-29 ENCOUNTER — Telehealth: Payer: Self-pay | Admitting: Family Medicine

## 2020-02-29 NOTE — Telephone Encounter (Signed)
Patient call back to cancel tramadol request she realizes prescription was prescribed by a different doctor.

## 2020-02-29 NOTE — Telephone Encounter (Signed)
Requesting:Tramadol Contract: none needs csc EOF:HQRF Last Visit:02/15/2020 Next Visit:05/28/2020 Last Refill: ? Historical provider  Please Advise

## 2020-02-29 NOTE — Telephone Encounter (Signed)
Patient states she has no medicine.   Medication: traMADol (ULTRAM) 50 MG tablet      Has the patient contacted their pharmacy?  (If no, request that the patient contact the pharmacy for the refill.) (If yes, when and what did the pharmacy advise?)     Preferred Pharmacy (with phone number or street name): [id number] - [place], [place] - [address]  [address] [place], [place] [address]  Phone:  [phone] Fax:  [phone]      Agent: Please be advised that RX refills may take up to 3 business days. We ask that you follow-up with your pharmacy.

## 2020-03-27 NOTE — Progress Notes (Signed)
[place] at [facility] [address] [place], [place] [address] [phone]  Date:  03/28/2020   Name:  Mary Buck   DOB:  [birth year]   MRN:  [id number]  PCP:  Darreld Mclean, MD    Chief Complaint: Follow-up (back surgery) and Nose Infection   History of Present Illness:  Mary Buck is a 84 y.o. very pleasant female patient who presents with the following:  Here today for a follow-up visit- last seen by myself in early June.  From that visit: Patient with history of significant skin cancer problems, bladder cancer, spinal stenosis, hypothyroidism, hyperlipidemia, peripheral neuropathy, neurogenic claudication She also broke her right humerus not long ago- she is seeing ortho anddoingPT. Now released by her orthopedist as of yesterday. Her arm is improved but not 100% yet She also just recently went underwent treatment for recurrent bladder cancer  At last visit her external nasal cellulitis was much better and we did not continue abx due to SE She has been seeing [facility] in [place] with chronic back pain and spinal stenosis  She had an L3/4 vertiflex on 6/21. She does feel like the pain in her left side is better but pain continues in her right side and she still has sx of spinal stenosis  She just had an MRI on 7/9 that showed:    IMPRESSION:  Since the study of September 2020, the patient has had placement of  an inter spinous device at L4-5. 4 mm anterolisthesis again  demonstrated at that level. Bulging of the disc. Facet hypertrophy.  Moderate multifactorial stenosis similar to the previous exam.  L1-2: 2 mm retrolisthesis. Disc degeneration with discogenic  endplate marrow edema which could be associated with back pain,  slightly worsened since last September. Moderate stenosis of the  canal and foramina at this level, slightly worsened.  L2-3: 4 mm retrolisthesis. Endplate  osteophytes, bulging of the disc  and mild facet hypertrophy. Moderate stenosis similar to last year's  study.  L3-4: Disc bulge and facet hypertrophy. Mild stenosis without  definite neural compression.  L5-S1: Endplate osteophytes, bulging of the disc and facet  hypertrophy. Mild stenosis of the lateral recesses and foramina,  similar to the previous study.   She is now taking gabapentin 300 am and noon, 600 pm She is taking tramadol 50 am. 50 noon and 100 pm  Patient Active Problem List   Diagnosis Date Noted  . Osteopenia 05/30/2019  . Pedal edema 01/31/2018  . Squamous cell carcinoma of skin of left lower limb, including hip 12/16/2017  . Plantar fasciitis 07/07/2017  . DDD (degenerative disc disease), lumbar 06/01/2017  . Fibromyalgia 06/01/2017  . Spinal stenosis at L4-L5 level 04/30/2017  . Peripheral neuropathy 04/30/2017  . Encounter for medication monitoring 03/05/2017  . Bladder cancer ([place]) 08/20/2015  . Cervical stenosis of spinal canal 07/27/2015  . Spinal stenosis of cervical region 07/27/2015  . Spondylolisthesis 07/27/2015  . Hypothyroid 07/05/2015  . Abdominal pain, acute, left upper quadrant 06/28/2015  . Basal cell carcinoma, face 06/28/2015  . Bilateral groin pain 06/28/2015  . Chronic pain 06/28/2015  . Fatigue 06/28/2015  . Hyperlipidemia 06/28/2015  . Pain in joints 06/28/2015  . Paresthesia of lower lip 06/28/2015  . Pulmonary nodule 06/28/2015  . Pustulosis palmaris et plantaris 06/28/2015  . Ulcer of nose 06/28/2015  . 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  . Mass of parotid gland 03/24/2014    Past  Medical History:  Diagnosis Date  . Allergy   . Asthma   . Cancer ([place])   . Cataract   . DDD (degenerative disc disease), lumbar 06/01/2017  . Depression   . Heart murmur   . Hyperlipidemia   . Peripheral neuropathy 04/30/2017  . Thyroid disease     Past Surgical History:  Procedure Laterality Date  . bladder cancer  2013  . CARPAL TUNNEL  RELEASE  2015  . CATARACT EXTRACTION Bilateral 2012  . CHOLECYSTECTOMY    . SALIVARY GLAND SURGERY Left 2015   benign  . SPINE SURGERY  6/31/21   vertaflex  . TONSILLECTOMY AND ADENOIDECTOMY  1943    Social History   Tobacco Use  . Smoking status: Former Smoker    Quit date: 2015    Years since quitting: 6.5  . Smokeless tobacco: Never Used  Vaping Use  . Vaping Use: Never used  Substance Use Topics  . Alcohol use: No  . Drug use: No    Family History  Problem Relation Age of Onset  . Hyperlipidemia Father   . Stroke Father     Allergies  Allergen Reactions  . Oxycodone Anaphylaxis    "my throat swells with any opioid"  . Cephalexin     Other reaction(s): unsure  . Contrast Media [Iodinated Diagnostic Agents]   . Latex     Other reaction(s): blistering, redness   . Mirtazapine     Other reaction(s): nausea  . Penicillins   . Sulfa Antibiotics   . Sulfur Other (See Comments)  . Acitretin Rash    Medication list has been reviewed and updated.  Current Outpatient Medications on File Prior to Visit  Medication Sig Dispense Refill  . acitretin (SORIATANE) 10 MG capsule Take 10 mg by mouth 3 (three) times a week.    . Cholecalciferol (VITAMIN D-3 PO) Take 1,000 Units by mouth daily.    Marland Kitchen gabapentin (NEURONTIN) 300 MG capsule Take 1 capsule (300 mg total) by mouth 3 (three) times daily. (Patient taking differently: Take 300 mg by mouth 3 (three) times daily. Third dose is 600 mg) 90 capsule 4  . lactobacillus acidophilus (BACID) TABS tablet Take 1 tablet by mouth daily.     . lansoprazole (PREVACID) 30 MG capsule TAKE ONE CAPSULE BY MOUTH TWICE DAILY BEFORE A MEAL. 180 capsule 3  . levothyroxine (SYNTHROID) 88 MCG tablet TAKE 1 TABLET(88 MCG) BY MOUTH DAILY BEFORE BREAKFAST 90 tablet 1  . misoprostol (CYTOTEC) 100 MCG tablet     . simvastatin (ZOCOR) 20 MG tablet Take 1 tablet (20 mg total) by mouth daily. 90 tablet 3  . traMADol (ULTRAM) 50 MG tablet Take 50 mg by  mouth 3 (three) times daily as needed.     No current facility-administered medications on file prior to visit.    Review of Systems:  As per HPI- otherwise negative.   Physical Examination: Vitals:   03/28/20 1435  BP: 140/80  Pulse: 64  Resp: 16  Temp: (!) 97 F (36.1 C)  SpO2: 97%   Vitals:   03/28/20 1435  Weight: 169 lb (76.7 kg)  Height: 5\' 6"  (1.676 m)   Body mass index is 27.28 kg/m. Ideal Body Weight: Weight in (lb) to have BMI = 25: 154.6  GEN: no acute distress.  Mild overweight, looks well HEENT: Atraumatic, Normocephalic.  External nose is normal.  The inside of the left nare does display some crusting and clotted blood, consistent with possible cellulitis or impetigo  Ears and Nose: No external deformity. CV: RRR, No M/G/R. No JVD. No thrill. No extra heart sounds. PULM: CTA B, no wheezes, crackles, rhonchi. No retractions. No resp. distress. No accessory muscle use. ABD: S, NT, ND EXTR: No c/c/e PSYCH: Normally interactive. Conversant.    Assessment and Plan: Cellulitis of nasal tip - Plan: mupirocin cream (BACTROBAN) 2 %  Gait instability  Other polyneuropathy  Spinal stenosis of lumbar region, unspecified whether neurogenic claudication present  Patient here today for follow-up visit.  I will have her try mupirocin topically on the inside of her nose about a week We discussed her issues with her back today.  She is seen a back surgeon and recently had a procedure.  This did seem to help her at least somewhat.  We also discussed her spinal stenosis.  She plans to continue physical therapy to work on balance and gait training, I agree this is a good idea.  Advised her exercise will not resolve her lumbar stenosis, but may help preserve her strength and mobility  We discussed her medications that she is currently using for pain These medications are giving her at least some relief  She has an appointment to see me in September for follow-up, we will  plan to keep this appointment though she can push it back if doing well This visit occurred during the SARS-CoV-2 public health emergency.  Safety protocols were in place, including screening questions prior to the visit, additional usage of staff PPE, and extensive cleaning of exam room while observing appropriate contact time as indicated for disinfecting solutions.    Signed Lamar Blinks, MD

## 2020-03-27 NOTE — Patient Instructions (Addendum)
Good to see you again today! Please keep me posted about your back and leg symptoms- I hope that you continue to see improvement from your recent operation Use the mupirocin in your nose as needed for skin infection I do think exercise is a good idea as far as maintaining your strength, but will not actually improve your spinal stenosis symptoms

## 2020-03-27 NOTE — Progress Notes (Signed)
Subjective:   Mary Buck is a 84 y.o. female who presents for Medicare Annual (Subsequent) preventive examination.  Review of Systems     Cardiac Risk Factors include: advanced age (>64men, >64 women);dyslipidemia     Objective:    Today's Vitals   03/28/20 1400  BP: 140/80  Pulse: 64  Temp: (!) 97 F (36.1 C)  TempSrc: Temporal  SpO2: 97%  Weight: 169 lb 6.4 oz (76.8 kg)  Height: 5\' 6"  (1.676 m)  PainSc: 8    Body mass index is 27.34 kg/m.  Advanced Directives 03/28/2020 11/22/2019 04/13/2019 10/13/2018 10/06/2018 08/04/2018 07/25/2018  Does Patient Have a Medical Advance Directive? Yes No Yes Yes No Yes Yes  Type of Paramedic of [place];Living will - - [place];Living will - Healthcare Power of [place]  Does patient want to make changes to medical advance directive? No - Patient declined - No - Patient declined No - Patient declined - No - Patient declined No - Patient declined  Copy of [place] in Chart? No - copy requested - - - - No - copy requested No - copy requested  Would patient like information on creating a medical advance directive? - - - - No - Patient declined - -    Current Medications (verified) Outpatient Encounter Medications as of 03/28/2020  Medication Sig   acitretin (SORIATANE) 10 MG capsule Take 10 mg by mouth 3 (three) times a week.   Cholecalciferol (VITAMIN D-3 PO) Take 1,000 Units by mouth daily.   gabapentin (NEURONTIN) 300 MG capsule Take 1 capsule (300 mg total) by mouth 3 (three) times daily. (Patient taking differently: Take 300 mg by mouth 3 (three) times daily. Third dose is 600 mg)   lactobacillus acidophilus (BACID) TABS tablet Take 1 tablet by mouth daily.    lansoprazole (PREVACID) 30 MG capsule TAKE ONE CAPSULE BY MOUTH TWICE DAILY BEFORE A MEAL.   levothyroxine (SYNTHROID) 88 MCG tablet TAKE 1 TABLET(88 MCG) BY MOUTH DAILY BEFORE  BREAKFAST   misoprostol (CYTOTEC) 100 MCG tablet    simvastatin (ZOCOR) 20 MG tablet Take 1 tablet (20 mg total) by mouth daily.   traMADol (ULTRAM) 50 MG tablet Take 50 mg by mouth 3 (three) times daily as needed.   clindamycin (CLEOCIN) 300 MG capsule Take 1 capsule (300 mg total) by mouth 3 (three) times daily. (Patient not taking: Reported on 03/28/2020)   furosemide (LASIX) 20 MG tablet Take 1 or 2 daily as needed for swelling in your legs (Patient not taking: Reported on 03/28/2020)   triamcinolone cream (KENALOG) 0.1 % APPLY TOPICALLY TO THE AFFECTED AREA TWICE DAILY AS NEEDED FOR SPOTS ON YOUR SKIN (Patient not taking: Reported on 03/28/2020)   No facility-administered encounter medications on file as of 03/28/2020.    Allergies (verified) Oxycodone, Cephalexin, Contrast media [iodinated diagnostic agents], Latex, Mirtazapine, Penicillins, Sulfa antibiotics, Sulfur, and Acitretin   History: Past Medical History:  Diagnosis Date   Allergy    Asthma    Cancer ([place])    Cataract    DDD (degenerative disc disease), lumbar 06/01/2017   Depression    Heart murmur    Hyperlipidemia    Peripheral neuropathy 04/30/2017   Thyroid disease    Past Surgical History:  Procedure Laterality Date   bladder cancer  2013   CARPAL TUNNEL RELEASE  2015   CATARACT EXTRACTION Bilateral 2012   CHOLECYSTECTOMY     SALIVARY GLAND  SURGERY Left 2015   benign   SPINE SURGERY  6/31/21   vertaflex   TONSILLECTOMY AND ADENOIDECTOMY  1943   Family History  Problem Relation Age of Onset   Hyperlipidemia Father    Stroke Father    Social History   Socioeconomic History   Marital status: Significant Other    Spouse name: Not on file   Number of children: Not on file   Years of education: Not on file   Highest education level: Not on file  Occupational History   Not on file  Tobacco Use   Smoking status: Former Smoker    Quit date: 2015    Years since quitting:  6.5   Smokeless tobacco: Never Used  Scientific laboratory technician Use: Never used  Substance and Sexual Activity   Alcohol use: No   Drug use: No   Sexual activity: Not on file  Other Topics Concern   Not on file  Social History Narrative   Not on file   Social Determinants of Health   Financial Resource Strain: Low Risk    Difficulty of Paying Living Expenses: Not hard at all  Food Insecurity: No Food Insecurity   Worried About Charity fundraiser in the Last Year: Never true   Arboriculturist in the Last Year: Never true  Transportation Needs: No Transportation Needs   Lack of Transportation (Medical): No   Lack of Transportation (Non-Medical): No  Physical Activity:    Days of Exercise per Week:    Minutes of Exercise per Session:   Stress:    Feeling of Stress :   Social Connections:    Frequency of Communication with Friends and Family:    Frequency of Social Gatherings with Friends and Family:    Attends Religious Services:    Active Member of Clubs or Organizations:    Attends Music therapist:    Marital Status:     Tobacco Counseling Counseling given: Not Answered   Clinical Intake:     Pain : 0-10 Pain Score: 8  Pain Location: Leg Pain Orientation: Right, Left Pain Descriptors / Indicators: Nagging Pain Onset: More than a month ago Pain Relieving Factors: pain meds  Pain Relieving Factors: pain meds                 Activities of Daily Living In your present state of health, do you have any difficulty performing the following activities: 03/28/2020 12/12/2019  Hearing? N N  Vision? N N  Difficulty concentrating or making decisions? N N  Walking or climbing stairs? Y N  Dressing or bathing? N N  Doing errands, shopping? N N  Preparing Food and eating ? N -  Using the Toilet? N -  In the past six months, have you accidently leaked urine? N -  Do you have problems with loss of bowel control? N -  Managing your  Medications? N -  Managing your Finances? N -  Housekeeping or managing your Housekeeping? Y -  Some recent data might be hidden    Patient Care Team: Copland, Gay Filler, MD as PCP - General (Family Medicine)  Indicate any recent Medical Services you may have received from other than [facility] providers in the past year (date may be approximate).     Assessment:   This is a routine wellness examination for Mary Buck.  Dietary issues and exercise activities discussed: Current Exercise Habits: The patient does not participate in regular exercise at  present, Exercise limited by: None identified  Goals     LIFESTYLE - DECREASE FALLS RISK      Depression Screen PHQ 2/9 Scores 03/28/2020 04/30/2017 04/30/2017  PHQ - 2 Score 0 - 0  Exception Documentation - Medical reason Patient refusal    Fall Risk Fall Risk  03/28/2020 04/18/2019 05/26/2018 04/30/2017  Falls in the past year? 1 0 No No  Comment - Emmi Telephone Survey: data to providers prior to load - -  Number falls in past yr: 0 - - -  Injury with Fall? 1 - - -  Follow up Education provided;Falls prevention discussed - - -   Lives w/ partner. 1 story home.  Any stairs in or around the home? Yes  If so, are there any without handrails? No  Home free of loose throw rugs in walkways, pet beds, electrical cords, etc? Yes  Adequate lighting in your home to reduce risk of falls? Yes   ASSISTIVE DEVICES UTILIZED TO PREVENT FALLS:  Life alert? No  Use of a cane, walker or w/c? Yes  Grab bars in the bathroom? Yes  Shower chair or bench in shower? Yes  Elevated toilet seat or a handicapped toilet? No   Gait steady and fast with assistive device  Cognitive Function:     6CIT Screen 03/28/2020  What Year? 0 points  What month? 0 points  What time? 0 points  Count back from 20 0 points  Months in reverse 0 points  Repeat phrase 0 points  Total Score 0    Immunizations Immunization History  Administered Date(s) Administered    Influenza Split 06/15/2009, 05/29/2011, 06/14/2013   Influenza, High Dose Seasonal PF 05/21/2010, 06/30/2014, 06/14/2017   Influenza, Seasonal, Injecte, Preservative Fre 05/21/2010   Influenza,inj,Quad PF,6+ Mos 06/18/2015   Influenza,inj,quad, With Preservative 06/30/2014   Influenza-Unspecified 06/15/2009, 05/29/2011, 10/21/2012, 06/14/2013, 06/18/2015, 05/31/2018   PFIZER SARS-COV-2 Vaccination 10/20/2019, 11/10/2019   Pneumococcal Conjugate-13 08/27/2004, 10/05/2014   Pneumococcal Polysaccharide-23 08/27/2004   Pneumococcal-Unspecified 08/27/2004   Tdap 12/13/2009   Zoster 09/21/2013      Pneumococcal vaccine status: Up to date Covid-19 vaccine status: Completed vaccines  Qualifies for Shingles Vaccine? Yes   Zostavax completed Yes     Screening Tests Health Maintenance  Topic Date Due   TETANUS/TDAP  12/14/2019   INFLUENZA VACCINE  04/15/2020   COLONOSCOPY  02/23/2023   DEXA SCAN  Completed   COVID-19 Vaccine  Completed   PNA vac Low Risk Adult  Completed    Health Maintenance  Health Maintenance Due  Topic Date Due   TETANUS/TDAP  12/14/2019    Colorectal cancer screening: No longer required.  Mammogram status: Completed 05/30/19. Repeat every year Bone Density status: Completed 05/30/19. Results reflect: Bone density results: OSTEOPENIA. Repeat every 2 years.   Additional Screening:   Vision Screening: Recommended annual ophthalmology exams for early detection of glaucoma and other disorders of the eye. Is the patient up to date with their annual eye exam?  Yes    Dental Screening: Recommended annual dental exams for proper oral hygiene  Community Resource Referral / Chronic Care Management: CRR required this visit?  No   CCM required this visit?  No      Plan:     Please schedule your next medicare wellness visit with me in 1 yr.  Continue to eat heart healthy diet (full of fruits, vegetables, whole grains, lean protein,  water--limit salt, fat, and sugar intake) and increase physical activity as tolerated.  Continue  doing brain stimulating activities (puzzles, reading, adult coloring books, staying active) to keep memory sharp.    I have personally reviewed and noted the following in the patients chart:    Medical and social history  Use of alcohol, tobacco or illicit drugs   Current medications and supplements  Functional ability and status  Nutritional status  Physical activity  Advanced directives  List of other physicians  Hospitalizations, surgeries, and ER visits in previous 12 months  Vitals  Screenings to include cognitive, depression, and falls  Referrals and appointments  In addition, I have reviewed and discussed with patient certain preventive protocols, quality metrics, and best practice recommendations. A written personalized care plan for preventive services as well as general preventive health recommendations were provided to patient.     Naaman [place], [place]   03/28/2020

## 2020-03-28 ENCOUNTER — Ambulatory Visit (INDEPENDENT_AMBULATORY_CARE_PROVIDER_SITE_OTHER): Payer: Medicare Other | Admitting: *Deleted

## 2020-03-28 ENCOUNTER — Encounter: Payer: Self-pay | Admitting: *Deleted

## 2020-03-28 ENCOUNTER — Encounter: Payer: Self-pay | Admitting: Family Medicine

## 2020-03-28 ENCOUNTER — Other Ambulatory Visit: Payer: Self-pay

## 2020-03-28 ENCOUNTER — Ambulatory Visit (INDEPENDENT_AMBULATORY_CARE_PROVIDER_SITE_OTHER): Payer: Medicare Other | Admitting: Family Medicine

## 2020-03-28 VITALS — BP 140/80 | HR 64 | Temp 97.0°F | Resp 16 | Ht 66.0 in | Wt 169.0 lb

## 2020-03-28 VITALS — BP 140/80 | HR 64 | Temp 97.0°F | Ht 66.0 in | Wt 169.4 lb

## 2020-03-28 DIAGNOSIS — G6289 Other specified polyneuropathies: Secondary | ICD-10-CM | POA: Diagnosis not present

## 2020-03-28 DIAGNOSIS — J340 Abscess, furuncle and carbuncle of nose: Secondary | ICD-10-CM

## 2020-03-28 DIAGNOSIS — Z0000 Encounter for general adult medical examination without abnormal findings: Secondary | ICD-10-CM

## 2020-03-28 DIAGNOSIS — R2681 Unsteadiness on feet: Secondary | ICD-10-CM

## 2020-03-28 DIAGNOSIS — M48061 Spinal stenosis, lumbar region without neurogenic claudication: Secondary | ICD-10-CM | POA: Diagnosis not present

## 2020-03-28 MED ORDER — MUPIROCIN CALCIUM 2 % EX CREA: 1.0000 "application " | TOPICAL_CREAM | Freq: Two times a day (BID) | CUTANEOUS | 0 refills | Status: DC

## 2020-03-28 NOTE — Patient Instructions (Signed)
Please schedule your next medicare wellness visit with me in 1 yr.  Continue to eat heart healthy diet (full of fruits, vegetables, whole grains, lean protein, water--limit salt, fat, and sugar intake) and increase physical activity as tolerated.  Continue doing brain stimulating activities (puzzles, reading, adult coloring books, staying active) to keep memory sharp.    Mary Buck , Thank you for taking time to come for your Medicare Wellness Visit. I appreciate your ongoing commitment to your health goals. Please review the following plan we discussed and let me know if I can assist you in the future.   These are the goals we discussed: Goals     LIFESTYLE - DECREASE FALLS RISK       This is a list of the screening recommended for you and due dates:  Health Maintenance  Topic Date Due   Tetanus Vaccine  12/14/2019   Flu Shot  04/15/2020   Colon Cancer Screening  02/23/2023   DEXA scan (bone density measurement)  Completed   COVID-19 Vaccine  Completed   Pneumonia vaccines  Completed    Preventive Care 82 Years and Older, Female Preventive care refers to lifestyle choices and visits with your health care provider that can promote health and wellness. This includes:  A yearly physical exam. This is also called an annual well check.  Regular dental and eye exams.  Immunizations.  Screening for certain conditions.  Healthy lifestyle choices, such as diet and exercise. What can I expect for my preventive care visit? Physical exam Your health care provider will check:  Height and weight. These may be used to calculate body mass index (BMI), which is a measurement that tells if you are at a healthy weight.  Heart rate and blood pressure.  Your skin for abnormal spots. Counseling Your health care provider may ask you questions about:  Alcohol, tobacco, and drug use.  Emotional well-being.  Home and relationship well-being.  Sexual activity.  Eating  habits.  History of falls.  Memory and ability to understand (cognition).  Work and work Statistician.  Pregnancy and menstrual history. What immunizations do I need?  Influenza (flu) vaccine  This is recommended every year. Tetanus, diphtheria, and pertussis (Tdap) vaccine  You may need a Td booster every 10 years. Varicella (chickenpox) vaccine  You may need this vaccine if you have not already been vaccinated. Zoster (shingles) vaccine  You may need this after age 82. Pneumococcal conjugate (PCV13) vaccine  One dose is recommended after age 82. Pneumococcal polysaccharide (PPSV23) vaccine  One dose is recommended after age 82. Measles, mumps, and rubella (MMR) vaccine  You may need at least one dose of MMR if you were born in 1957 or later. You may also need a second dose. Meningococcal conjugate (MenACWY) vaccine  You may need this if you have certain conditions. Hepatitis A vaccine  You may need this if you have certain conditions or if you travel or work in places where you may be exposed to hepatitis A. Hepatitis B vaccine  You may need this if you have certain conditions or if you travel or work in places where you may be exposed to hepatitis B. Haemophilus influenzae type b (Hib) vaccine  You may need this if you have certain conditions. You may receive vaccines as individual doses or as more than one vaccine together in one shot (combination vaccines). Talk with your health care provider about the risks and benefits of combination vaccines. What tests do I need? Blood  tests  Lipid and cholesterol levels. These may be checked every 5 years, or more frequently depending on your overall health.  Hepatitis C test.  Hepatitis B test. Screening  Lung cancer screening. You may have this screening every year starting at age 82 if you have a 30-pack-year history of smoking and currently smoke or have quit within the past 15 years.  Colorectal cancer screening.  All adults should have this screening starting at age 29 and continuing until age 29. Your health care provider may recommend screening at age 4 if you are at increased risk. You will have tests every 1-10 years, depending on your results and the type of screening test.  Diabetes screening. This is done by checking your blood sugar (glucose) after you have not eaten for a while (fasting). You may have this done every 1-3 years.  Mammogram. This may be done every 1-2 years. Talk with your health care provider about how often you should have regular mammograms.  BRCA-related cancer screening. This may be done if you have a family history of breast, ovarian, tubal, or peritoneal cancers. Other tests  Sexually transmitted disease (STD) testing.  Bone density scan. This is done to screen for osteoporosis. You may have this done starting at age 46. Follow these instructions at home: Eating and drinking  Eat a diet that includes fresh fruits and vegetables, whole grains, lean protein, and low-fat dairy products. Limit your intake of foods with high amounts of sugar, saturated fats, and salt.  Take vitamin and mineral supplements as recommended by your health care provider.  Do not drink alcohol if your health care provider tells you not to drink.  If you drink alcohol: ? Limit how much you have to 0-1 drink a day. ? Be aware of how much alcohol is in your drink. In the [place] one drink equals one 12 oz bottle of beer (355 mL), one 5 oz glass of wine (148 mL), or one 1 oz glass of hard liquor (44 mL). Lifestyle  Take daily care of your teeth and gums.  Stay active. Exercise for at least 30 minutes on 5 or more days each week.  Do not use any products that contain nicotine or tobacco, such as cigarettes, e-cigarettes, and chewing tobacco. If you need help quitting, ask your health care provider.  If you are sexually active, practice safe sex. Use a condom or other form of protection in order  to prevent STIs (sexually transmitted infections).  Talk with your health care provider about taking a low-dose aspirin or statin. What's next?  Go to your health care provider once a year for a well check visit.  Ask your health care provider how often you should have your eyes and teeth checked.  Stay up to date on all vaccines. This information is not intended to replace advice given to you by your health care provider. Make sure you discuss any questions you have with your health care provider. Document Revised: 08/26/2018 Document Reviewed: 08/26/2018 Elsevier Patient Education  2020 [employer].

## 2020-04-13 ENCOUNTER — Encounter: Payer: Self-pay | Admitting: Family Medicine

## 2020-04-13 DIAGNOSIS — R2681 Unsteadiness on feet: Secondary | ICD-10-CM

## 2020-04-18 ENCOUNTER — Other Ambulatory Visit (HOSPITAL_BASED_OUTPATIENT_CLINIC_OR_DEPARTMENT_OTHER): Payer: Self-pay | Admitting: Family Medicine

## 2020-04-18 DIAGNOSIS — Z1231 Encounter for screening mammogram for malignant neoplasm of breast: Secondary | ICD-10-CM

## 2020-04-20 ENCOUNTER — Encounter: Payer: Self-pay | Admitting: Family Medicine

## 2020-04-23 ENCOUNTER — Encounter: Payer: Self-pay | Admitting: Family Medicine

## 2020-04-23 LAB — NOVEL CORONAVIRUS, NAA: SARS-CoV-2, NAA: NOT DETECTED

## 2020-04-29 NOTE — Patient Instructions (Addendum)
It was good to see you again today!  We will set you up to see Dr Bryan Lemma- the GI doctor - here at the [facility] to discuss your stool changes   Let's check on your mild anemia today Try getting some more sleep- try to get 7- 8 hours a night and try taking a nap  Let me know if you would like to see a sleep doctor for consideration of sleep study for sleep apnea   Please think about getting a tetanus vaccine at your drug store at your convenience

## 2020-04-29 NOTE — Progress Notes (Addendum)
[place] at [facility] [address] [place], [place] [address] [phone]  Date:  04/30/2020   Name:  Mary Buck   DOB:  07/02/1936   MRN:  [id number]  PCP:  Darreld Mclean, MD    Chief Complaint: Follow-up (body aches, fatigue, weakness, )   History of Present Illness:  Mary Buck is a 84 y.o. very pleasant female patient who presents with the following:  Patient here today for follow-up visit; she had contacted me recently with concern of fatigue and diarrhea. We had her get a COVID-19 test which was negative Last seen by myself about 1 month ago-for possible mild cellulitis of her nose. I had her use topical mupirocin Patient with history of significant skin cancer problems, bladder cancer, spinal stenosis, hypothyroidism, hyperlipidemia, peripheral neuropathy, neurogenic claudication She also broke her right humerus not long ago- she is seeing ortho anddoingPT. Now released by her orthopedist as of yesterday. Her arm is improved but not 100% yet She also just recently went underwent treatment for recurrent bladder cancer  She is treated by [facility] for hip pain/spinal stenosis with neurogenic claudication, most recent visit August 13 She is also undergoing physical therapy. She is working on her balance with PT, they are also helping her with some exercises for lymphedema of her leg  Tetanus update now due Can recommend Shingrix  She describes having a stool every 2-3 days, and then she may have a loose, watery and urgent stool.  Sometimes her stool is normal/ solid She has always had somewhat irregular stools She got a covid 19 test a week ago- negative  She does not have a GI in [place]- she generally sees her gastroenterologist in [place] periodically.  Last visit was about 2 years ago- pre-pandemic. Most recent colonoscopy was a few years ago - she is not quite sure, thinks in the last 5  years or so.  Looks fine per her recollection  Hip films 8/12 per pain management.  They plan to do a steroid injection for her in the right hip which is more painful CLINICAL DATA: Bilateral hip pain and stiffness for several years   EXAM:  DG HIP (WITH OR WITHOUT PELVIS) 2V BILAT   COMPARISON: 12/12/2019   FINDINGS:  Frontal view of the pelvis as well as frontal and frogleg lateral  views of both hips are obtained. No acute fracture, subluxation, or  dislocation within either hip. There is bilateral hip  osteoarthritis, left greater than right. Overall, there is slight  progression of the joint space narrowing on the left since prior  exam, with small subchondral cysts identified. Prominent marginal  osteophytes along the bilateral acetabuli.   The remainder of the bony pelvis is stable. Interval postsurgical  changes at the L4/L5 level. IMPRESSION:  1. Bilateral hip osteoarthritis, left greater than right, with mild  progression since prior exam.  2. No acute bony abnormality.     Also notes that she may feel tired or sleepy, this is typically worse after eating She sleeps 6-7 hours a night generally  She snores some but does not awaken her partner  Wt Readings from Last 3 Encounters:  04/30/20 167 lb (75.8 kg)  03/28/20 169 lb (76.7 kg)  03/28/20 169 lb 6.4 oz (76.8 kg)    Patient Active Problem List   Diagnosis Date Noted  . Osteopenia 05/30/2019  . Pedal edema 01/31/2018  . Squamous cell carcinoma  of skin of left lower limb, including hip 12/16/2017  . Plantar fasciitis 07/07/2017  . DDD (degenerative disc disease), lumbar 06/01/2017  . Fibromyalgia 06/01/2017  . Spinal stenosis at L4-L5 level 04/30/2017  . Peripheral neuropathy 04/30/2017  . Encounter for medication monitoring 03/05/2017  . Bladder cancer ([place]) 08/20/2015  . Cervical stenosis of spinal canal 07/27/2015  . Spinal stenosis of cervical region 07/27/2015  . Spondylolisthesis 07/27/2015  .  Hypothyroid 07/05/2015  . Abdominal pain, acute, left upper quadrant 06/28/2015  . Basal cell carcinoma, face 06/28/2015  . Bilateral groin pain 06/28/2015  . Chronic pain 06/28/2015  . Fatigue 06/28/2015  . Hyperlipidemia 06/28/2015  . Pain in joints 06/28/2015  . Paresthesia of lower lip 06/28/2015  . Pulmonary nodule 06/28/2015  . Pustulosis palmaris et plantaris 06/28/2015  . Ulcer of nose 06/28/2015  . 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  . Mass of parotid gland 03/24/2014    Past Medical History:  Diagnosis Date  . Allergy   . Asthma   . Cancer ([place])   . Cataract   . DDD (degenerative disc disease), lumbar 06/01/2017  . Depression   . Heart murmur   . Hyperlipidemia   . Peripheral neuropathy 04/30/2017  . Thyroid disease     Past Surgical History:  Procedure Laterality Date  . bladder cancer  2013  . CARPAL TUNNEL RELEASE  2015  . CATARACT EXTRACTION Bilateral 2012  . CHOLECYSTECTOMY    . SALIVARY GLAND SURGERY Left 2015   benign  . SPINE SURGERY  6/31/21   vertaflex  . TONSILLECTOMY AND ADENOIDECTOMY  1943    Social History   Tobacco Use  . Smoking status: Former Smoker    Quit date: 2015    Years since quitting: 6.6  . Smokeless tobacco: Never Used  Vaping Use  . Vaping Use: Never used  Substance Use Topics  . Alcohol use: No  . Drug use: No    Family History  Problem Relation Age of Onset  . Hyperlipidemia Father   . Stroke Father     Allergies  Allergen Reactions  . Oxycodone Anaphylaxis    "my throat swells with any opioid"  . Cephalexin     Other reaction(s): unsure  . Contrast Media [Iodinated Diagnostic Agents]   . Latex     Other reaction(s): blistering, redness   . Mirtazapine     Other reaction(s): nausea  . Penicillins   . Sulfa Antibiotics   . Sulfur Other (See Comments)  . Acitretin Rash    Medication list has been reviewed and updated.  Current Outpatient Medications on File Prior to Visit  Medication Sig Dispense Refill  .  acitretin (SORIATANE) 10 MG capsule Take 10 mg by mouth 3 (three) times a week.    . Cholecalciferol (VITAMIN D-3 PO) Take 1,000 Units by mouth daily.    Marland Kitchen gabapentin (NEURONTIN) 300 MG capsule Take 1 capsule (300 mg total) by mouth 3 (three) times daily. (Patient taking differently: Take 300 mg by mouth 3 (three) times daily. Third dose is 600 mg) 90 capsule 4  . lactobacillus acidophilus (BACID) TABS tablet Take 1 tablet by mouth daily.     . lansoprazole (PREVACID) 30 MG capsule TAKE ONE CAPSULE BY MOUTH TWICE DAILY BEFORE A MEAL. 180 capsule 3  . levothyroxine (SYNTHROID) 88 MCG tablet TAKE 1 TABLET(88 MCG) BY MOUTH DAILY BEFORE BREAKFAST 90 tablet 1  . misoprostol (CYTOTEC) 100 MCG tablet     . mupirocin cream (BACTROBAN) 2 % Apply 1  application topically 2 (two) times daily. Use on inside of nares as needed 15 g 0  . simvastatin (ZOCOR) 20 MG tablet Take 1 tablet (20 mg total) by mouth daily. 90 tablet 3  . traMADol (ULTRAM) 50 MG tablet Take 50 mg by mouth 3 (three) times daily as needed.     No current facility-administered medications on file prior to visit.    Review of Systems:  As per HPI- otherwise negative.   Physical Examination: Vitals:   04/30/20 1141  BP: 118/60  Pulse: 64  Resp: 18  SpO2: 95%   Vitals:   04/30/20 1141  Weight: 167 lb (75.8 kg)  Height: 5\' 6"  (1.676 m)   Body mass index is 26.95 kg/m. Ideal Body Weight: Weight in (lb) to have BMI = 25: 154.6  GEN: no acute distress.  Well-appearing elderly woman HEENT: Atraumatic, Normocephalic.  Ears and Nose: No external deformity. CV: RRR, No M/G/R. No JVD. No thrill. No extra heart sounds. PULM: CTA B, no wheezes, crackles, rhonchi. No retractions. No resp. distress. No accessory muscle use. ABD: S, NT, ND EXTR: No c/c/e PSYCH: Normally interactive. Conversant.  Uses cane for ambulation   Assessment and Plan: Change in stool habits - Plan: Ambulatory referral to Gastroenterology  Fatigue,  unspecified type  Mild anemia - Plan: CBC, Ferritin  Iron deficiency anemia, unspecified iron deficiency anemia type - Plan: Ambulatory referral to Hematology  Patient here today with a couple of concerns She has noted change in her stool habits over the last few months.  She does not have a local gastroenterologist, she has still been traveling back to see her doctor in [place].  We will have her see gastroenterology here at the med center for consultation She has noticed some fatigue and sleepiness.  We discussed doing a sleep study, for the time being she declines but will let me know if she would like to undergo this study.  In the meantime she plans to work on getting a bit more sleep, I also encouraged her to take a nap if necessary  We will recheck her blood counts and ferritin level today This visit occurred during the SARS-CoV-2 public health emergency.  Safety protocols were in place, including screening questions prior to the visit, additional usage of staff PPE, and extensive cleaning of exam room while observing appropriate contact time as indicated for disinfecting solutions.    Signed Lamar Blinks, MD   Addendum 8/18, received her labs as below Message to patient-would recommend referral to hematology for possible iron infusion  Results for orders placed or performed in visit on 04/30/20  CBC  Result Value Ref Range   WBC 5.9 4.0 - 10.5 K/uL   RBC 3.50 (L) 3.87 - 5.11 Mil/uL   Platelets 219.0 150 - 400 K/uL   Hemoglobin 10.2 (L) 12.0 - 15.0 g/dL   HCT 30.9 (L) 36 - 46 %   MCV 88.3 78.0 - 100.0 fl   MCHC 32.9 30.0 - 36.0 g/dL   RDW 16.6 (H) 11.5 - 15.5 %  Ferritin  Result Value Ref Range   Ferritin 5.7 (L) 10.0 - 291.0 ng/mL   Your blood work shows continued anemia, worse since last year Also, your iron-ferritin-is quite low  This may be why you are feeling so worn out!  With your iron level being so low, it may be difficult to replace this by mouth.  I would  suggest having you see hematology-located right in my building.  Oftentimes an  IV iron infusion can be really helpful in this case  If okay with you, I will go ahead and make a referral  Let me know if any questions!

## 2020-04-30 ENCOUNTER — Other Ambulatory Visit: Payer: Self-pay

## 2020-04-30 ENCOUNTER — Encounter: Payer: Self-pay | Admitting: Family Medicine

## 2020-04-30 ENCOUNTER — Ambulatory Visit (INDEPENDENT_AMBULATORY_CARE_PROVIDER_SITE_OTHER): Payer: Medicare Other | Admitting: Family Medicine

## 2020-04-30 VITALS — BP 118/60 | HR 64 | Resp 18 | Ht 66.0 in | Wt 167.0 lb

## 2020-04-30 DIAGNOSIS — R5383 Other fatigue: Secondary | ICD-10-CM | POA: Diagnosis not present

## 2020-04-30 DIAGNOSIS — R194 Change in bowel habit: Secondary | ICD-10-CM

## 2020-04-30 DIAGNOSIS — D649 Anemia, unspecified: Secondary | ICD-10-CM

## 2020-04-30 DIAGNOSIS — D509 Iron deficiency anemia, unspecified: Secondary | ICD-10-CM | POA: Diagnosis not present

## 2020-04-30 LAB — CBC
HCT: 30.9 % — ABNORMAL LOW (ref 36.0–46.0)
Hemoglobin: 10.2 g/dL — ABNORMAL LOW (ref 12.0–15.0)
MCHC: 32.9 g/dL (ref 30.0–36.0)
MCV: 88.3 fl (ref 78.0–100.0)
Platelets: 219 10*3/uL (ref 150.0–400.0)
RBC: 3.5 Mil/uL — ABNORMAL LOW (ref 3.87–5.11)
RDW: 16.6 % — ABNORMAL HIGH (ref 11.5–15.5)
WBC: 5.9 10*3/uL (ref 4.0–10.5)

## 2020-04-30 LAB — FERRITIN: Ferritin: 5.7 ng/mL — ABNORMAL LOW (ref 10.0–291.0)

## 2020-05-02 NOTE — Addendum Note (Signed)
Addended by: Lamar Blinks C on: 05/02/2020 07:43 PM   Modules accepted: Orders

## 2020-05-15 ENCOUNTER — Ambulatory Visit (INDEPENDENT_AMBULATORY_CARE_PROVIDER_SITE_OTHER): Payer: Medicare Other | Admitting: Gastroenterology

## 2020-05-15 ENCOUNTER — Encounter: Payer: Self-pay | Admitting: Gastroenterology

## 2020-05-15 VITALS — BP 100/80 | HR 68 | Ht 66.0 in | Wt 166.2 lb

## 2020-05-15 DIAGNOSIS — K219 Gastro-esophageal reflux disease without esophagitis: Secondary | ICD-10-CM

## 2020-05-15 DIAGNOSIS — R195 Other fecal abnormalities: Secondary | ICD-10-CM

## 2020-05-15 DIAGNOSIS — K580 Irritable bowel syndrome with diarrhea: Secondary | ICD-10-CM

## 2020-05-15 NOTE — Patient Instructions (Addendum)
If you are age 82 or older, your body mass index should be between 23-30. Your Body mass index is 26.83 kg/m. If this is out of the aforementioned range listed, please consider follow up with your Primary Care Provider.  If you are age 49 or younger, your body mass index should be between 19-25. Your Body mass index is 26.83 kg/m. If this is out of the aformentioned range listed, please consider follow up with your Primary Care Provider.   Pepcid 20 mg at bedtime.  We are requesting records for endoscopies from 2019.  See Fiber Chart attached.  Follow up as needed.   It was a pleasure to see you today!  Gerrit Heck, D.O. Fiber Chart  You should be consuming 25-30g of fiber per day and drinking 8 glasses of water to help your bowels move regularly.  In the chart below you can look up how much fiber you are getting in an average day.  If you are not getting enough fiber, you should add a fiber supplement to your diet.  Examples of this include Metamucil, FiberCon and Citrucel.  These can be purchased at your local grocery store or pharmacy.      [email or web address]

## 2020-05-15 NOTE — Progress Notes (Signed)
Chief Complaint: Change in bowel habits, GERD   Referring Provider:     Darreld Mclean, MD   HPI:    Mary Buck is a 84 y.o. female with a history of chest pain, spinal stenosis with neurogenic claudication (follows at pain management), bladder CA, hypothyroidism, HLD, peripheral neuropathy, referred to the Gastroenterology Clinic for evaluation of change in bowel habits.    She reports a history of loose, nonbloody stools. Not watery/diarrhea. 1 BM/day, but can alternate with normal formed stools. +Urgency. Started approx 5 weeks ago after spine surgery.  Did receive general anesthesia perioperative ABX.  Does take probiotics.  Overall, symptoms described as minimally bothersome today.  Previously followed with a Copywriter, advertising in [place] for a longstanding history of loose, irregular stools (diagnosed with IBS per patient), but described as non-bothersome to patient.  Essentially well-controlled with dietary modification alone.  Last seen approximately 2 years ago, with last colonoscopy approx 2019 years ago in [place].  Requesting reports today.  Recent labs notable for iron deficiency anemia, and has referral in place to Hematology clinic. -Ferritin 5.7, H/H 10/31, MCV/RDW 88/16 -Baseline Hgb ~11.5 since 2019 -FOBT negative -Normal CMP  Separately, reports long standing hx of reflux, well controlled with Prevacid BID. Will have intermittent gas pressure and belch. Worse with coffee or overeating. Regurgitation with forward flexion.  EGD ~2019 as well, and normal per patient.   No recent abdominal imaging for review.   Past Medical History:  Diagnosis Date  . Allergy   . Asthma   . Cancer ([place])   . Cataract   . DDD (degenerative disc disease), lumbar 06/01/2017  . Depression   . GERD (gastroesophageal reflux disease)   . Heart murmur   . Hyperlipidemia   . Peripheral neuropathy 04/30/2017  . Thyroid disease      Past Surgical History:   Procedure Laterality Date  . bladder cancer  2013  . CARPAL TUNNEL RELEASE  2015  . CATARACT EXTRACTION Bilateral 2012  . CHOLECYSTECTOMY    . SALIVARY GLAND SURGERY Left 2015   benign  . SPINE SURGERY  6/31/21   vertaflex  . TONSILLECTOMY AND ADENOIDECTOMY  1943   Family History  Problem Relation Age of Onset  . Macular degeneration Mother   . Hyperlipidemia Father   . Stroke Father   . Cancer Brother        ? stomach  . Prostate cancer Other        in brothers  . Colon cancer Neg Hx    Social History   Tobacco Use  . Smoking status: Former Smoker    Quit date: 2015    Years since quitting: 6.6  . Smokeless tobacco: Never Used  Vaping Use  . Vaping Use: Never used  Substance Use Topics  . Alcohol use: No  . Drug use: No   Current Outpatient Medications  Medication Sig Dispense Refill  . acitretin (SORIATANE) 10 MG capsule Take 10 mg by mouth 3 (three) times a week.    . Cholecalciferol (VITAMIN D-3 PO) Take 1,000 Units by mouth daily.    Marland Kitchen gabapentin (NEURONTIN) 300 MG capsule Take 1 capsule (300 mg total) by mouth 3 (three) times daily. (Patient taking differently: Take 300 mg by mouth 3 (three) times daily. Third dose is 600 mg) 90 capsule 4  . lactobacillus acidophilus (BACID) TABS tablet Take 1 tablet by mouth daily.     Marland Kitchen  lansoprazole (PREVACID) 30 MG capsule TAKE ONE CAPSULE BY MOUTH TWICE DAILY BEFORE A MEAL. 180 capsule 3  . levothyroxine (SYNTHROID) 88 MCG tablet TAKE 1 TABLET(88 MCG) BY MOUTH DAILY BEFORE BREAKFAST 90 tablet 1  . misoprostol (CYTOTEC) 100 MCG tablet as directed. Takes two after breakfast and dinner    . mupirocin cream (BACTROBAN) 2 % Apply 1 application topically 2 (two) times daily. Use on inside of nares as needed 15 g 0  . simvastatin (ZOCOR) 20 MG tablet Take 1 tablet (20 mg total) by mouth daily. 90 tablet 3  . traMADol (ULTRAM) 50 MG tablet Take 50 mg by mouth 3 (three) times daily as needed. Takes 50 mg bid then 100 mg     No  current facility-administered medications for this visit.   Allergies  Allergen Reactions  . Oxycodone Anaphylaxis    "my throat swells with any opioid"  . Cephalexin     Other reaction(s): unsure  . Contrast Media [Iodinated Diagnostic Agents]   . Latex     Other reaction(s): blistering, redness   . Mirtazapine     Other reaction(s): nausea  . Penicillins   . Sulfa Antibiotics   . Sulfur Other (See Comments)  . Acitretin Rash     Review of Systems: All systems reviewed and negative except where noted in HPI.     Physical Exam:    Wt Readings from Last 3 Encounters:  05/15/20 166 lb 4 oz (75.4 kg)  04/30/20 167 lb (75.8 kg)  03/28/20 169 lb (76.7 kg)    BP 100/80   Pulse 68   Ht 5\' 6"  (1.676 m)   Wt 166 lb 4 oz (75.4 kg)   BMI 26.83 kg/m  Constitutional:  Pleasant, in no acute distress. Psychiatric: Normal mood and affect. Behavior is normal. EENT: Pupils normal.  Conjunctivae are normal. No scleral icterus. Neck supple. No cervical LAD. Cardiovascular: Normal rate, regular rhythm. No edema Pulmonary/chest: Effort normal and breath sounds normal. No wheezing, rales or rhonchi. Abdominal: Soft, nondistended, nontender. Bowel sounds active throughout. There are no masses palpable. No hepatomegaly. Neurological: Alert and oriented to person place and time. Skin: Skin is warm and dry. No rashes noted.   ASSESSMENT AND PLAN;   1) IBS-D 2) Change in bowel habit Reported longstanding history of IBS-D, relatively well controlled dietary modifications alone, with recent change in bowel habits following spine surgery which she received both general anesthesia and perioperative ABX.  Overall, she states symptoms are only minimally bothersome at this point, and she would like to proceed with conservative management alone -Maintain high-fiber diet.  Provided with fiber chart today with detailed instruction -Can add fiber supplement as needed to maintain regular, soft  stools -Maintain current active healthy lifestyle -Requested records from previous Gastroenterologist -If symptoms persist or worsen, plan for further evaluation to include GI PCR panel, pancreatic elastase, etc. +/-endoscopic evaluation  3) GERD -Resume Prevacid -Add Pepcid qhs -Resume antireflux lifestyle/dietary modifications -Avoid exacerbating foods and overeating -Requested records from previous EGD  RTC as needed  I spent 35 minutes of time, including in depth chart review, independent review of results as outlined above, communicating results with the patient directly, face-to-face time with the patient, coordinating care, ordering studies and medications as appropriate, and documentation.    Lavena Bullion, DO, FACG  05/15/2020, 2:35 PM   Copland, Gay Filler, MD

## 2020-05-28 ENCOUNTER — Ambulatory Visit: Payer: Commercial Indemnity | Admitting: Family Medicine

## 2020-05-29 ENCOUNTER — Other Ambulatory Visit: Payer: Self-pay | Admitting: Family

## 2020-05-29 ENCOUNTER — Other Ambulatory Visit: Payer: Commercial Indemnity

## 2020-05-29 ENCOUNTER — Inpatient Hospital Stay: Payer: Medicare Other | Admitting: Family

## 2020-05-29 DIAGNOSIS — D649 Anemia, unspecified: Secondary | ICD-10-CM

## 2020-05-30 ENCOUNTER — Inpatient Hospital Stay (HOSPITAL_BASED_OUTPATIENT_CLINIC_OR_DEPARTMENT_OTHER): Payer: Medicare Other | Admitting: Family

## 2020-05-30 ENCOUNTER — Encounter: Payer: Self-pay | Admitting: Family

## 2020-05-30 ENCOUNTER — Other Ambulatory Visit: Payer: Self-pay

## 2020-05-30 ENCOUNTER — Inpatient Hospital Stay: Payer: Medicare Other | Attending: Hematology & Oncology

## 2020-05-30 DIAGNOSIS — K5900 Constipation, unspecified: Secondary | ICD-10-CM | POA: Insufficient documentation

## 2020-05-30 DIAGNOSIS — J45909 Unspecified asthma, uncomplicated: Secondary | ICD-10-CM | POA: Diagnosis not present

## 2020-05-30 DIAGNOSIS — Z8349 Family history of other endocrine, nutritional and metabolic diseases: Secondary | ICD-10-CM

## 2020-05-30 DIAGNOSIS — Z85828 Personal history of other malignant neoplasm of skin: Secondary | ICD-10-CM

## 2020-05-30 DIAGNOSIS — Z87891 Personal history of nicotine dependence: Secondary | ICD-10-CM | POA: Diagnosis not present

## 2020-05-30 DIAGNOSIS — E039 Hypothyroidism, unspecified: Secondary | ICD-10-CM | POA: Insufficient documentation

## 2020-05-30 DIAGNOSIS — K219 Gastro-esophageal reflux disease without esophagitis: Secondary | ICD-10-CM

## 2020-05-30 DIAGNOSIS — Z79899 Other long term (current) drug therapy: Secondary | ICD-10-CM | POA: Diagnosis not present

## 2020-05-30 DIAGNOSIS — D649 Anemia, unspecified: Secondary | ICD-10-CM

## 2020-05-30 DIAGNOSIS — D509 Iron deficiency anemia, unspecified: Secondary | ICD-10-CM | POA: Diagnosis present

## 2020-05-30 DIAGNOSIS — D508 Other iron deficiency anemias: Secondary | ICD-10-CM

## 2020-05-30 DIAGNOSIS — E785 Hyperlipidemia, unspecified: Secondary | ICD-10-CM

## 2020-05-30 DIAGNOSIS — Z8551 Personal history of malignant neoplasm of bladder: Secondary | ICD-10-CM | POA: Insufficient documentation

## 2020-05-30 LAB — CBC WITH DIFFERENTIAL (CANCER CENTER ONLY)
Abs Immature Granulocytes: 0.12 10*3/uL — ABNORMAL HIGH (ref 0.00–0.07)
Basophils Absolute: 0.1 10*3/uL (ref 0.0–0.1)
Basophils Relative: 1 %
Eosinophils Absolute: 0.4 10*3/uL (ref 0.0–0.5)
Eosinophils Relative: 5 %
HCT: 33.6 % — ABNORMAL LOW (ref 36.0–46.0)
Hemoglobin: 10.3 g/dL — ABNORMAL LOW (ref 12.0–15.0)
Immature Granulocytes: 2 %
Lymphocytes Relative: 21 %
Lymphs Abs: 1.8 10*3/uL (ref 0.7–4.0)
MCH: 28.1 pg (ref 26.0–34.0)
MCHC: 30.7 g/dL (ref 30.0–36.0)
MCV: 91.8 fL (ref 80.0–100.0)
Monocytes Absolute: 0.6 10*3/uL (ref 0.1–1.0)
Monocytes Relative: 8 %
Neutro Abs: 5.3 10*3/uL (ref 1.7–7.7)
Neutrophils Relative %: 63 %
Platelet Count: 222 10*3/uL (ref 150–400)
RBC: 3.66 MIL/uL — ABNORMAL LOW (ref 3.87–5.11)
RDW: 14.9 % (ref 11.5–15.5)
WBC Count: 8.2 10*3/uL (ref 4.0–10.5)
nRBC: 0 % (ref 0.0–0.2)

## 2020-05-30 LAB — RETICULOCYTES
Immature Retic Fract: 13.4 % (ref 2.3–15.9)
RBC.: 3.67 MIL/uL — ABNORMAL LOW (ref 3.87–5.11)
Retic Count, Absolute: 37.4 10*3/uL (ref 19.0–186.0)
Retic Ct Pct: 1 % (ref 0.4–3.1)

## 2020-05-30 LAB — CMP (CANCER CENTER ONLY)
ALT: 10 U/L (ref 0–44)
AST: 15 U/L (ref 15–41)
Albumin: 3.6 g/dL (ref 3.5–5.0)
Alkaline Phosphatase: 72 U/L (ref 38–126)
Anion gap: 5 (ref 5–15)
BUN: 15 mg/dL (ref 8–23)
CO2: 33 mmol/L — ABNORMAL HIGH (ref 22–32)
Calcium: 8.8 mg/dL — ABNORMAL LOW (ref 8.9–10.3)
Chloride: 105 mmol/L (ref 98–111)
Creatinine: 0.92 mg/dL (ref 0.44–1.00)
GFR, Est AFR Am: >60 mL/min (ref >60)
GFR, Estimated: 58 mL/min — ABNORMAL LOW (ref >60)
Glucose, Bld: 92 mg/dL (ref 70–99)
Potassium: 4.1 mmol/L (ref 3.5–5.1)
Sodium: 143 mmol/L (ref 135–145)
Total Bilirubin: 0.4 mg/dL (ref 0.3–1.2)
Total Protein: 6.4 g/dL — ABNORMAL LOW (ref 6.5–8.1)

## 2020-05-30 LAB — SAMPLE TO BLOOD BANK

## 2020-05-30 LAB — SAVE SMEAR(SSMR), FOR PROVIDER SLIDE REVIEW
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
## 2020-05-31 ENCOUNTER — Encounter: Payer: Self-pay | Admitting: Family

## 2020-05-31 ENCOUNTER — Ambulatory Visit (HOSPITAL_BASED_OUTPATIENT_CLINIC_OR_DEPARTMENT_OTHER): Payer: Commercial Indemnity

## 2020-05-31 ENCOUNTER — Telehealth: Payer: Self-pay | Admitting: Family

## 2020-05-31 DIAGNOSIS — D509 Iron deficiency anemia, unspecified: Secondary | ICD-10-CM

## 2020-05-31 HISTORY — DX: Iron deficiency anemia, unspecified: D50.9

## 2020-05-31 LAB — LACTATE DEHYDROGENASE: LDH: 169 U/L (ref 98–192)

## 2020-05-31 LAB — ERYTHROPOIETIN: Erythropoietin: 26.6 m[IU]/mL — ABNORMAL HIGH (ref 2.6–18.5)

## 2020-05-31 LAB — IRON AND TIBC
Iron: 41 ug/dL (ref 41–142)
Saturation Ratios: 13 % — ABNORMAL LOW (ref 21–57)
TIBC: 315 ug/dL (ref 236–444)
UIBC: 274 ug/dL (ref 120–384)

## 2020-05-31 LAB — FERRITIN: Ferritin: 8 ng/mL — ABNORMAL LOW (ref 11–307)

## 2020-05-31 NOTE — Telephone Encounter (Signed)
No los 9/15

## 2020-06-04 ENCOUNTER — Ambulatory Visit (HOSPITAL_BASED_OUTPATIENT_CLINIC_OR_DEPARTMENT_OTHER)
Admission: RE | Admit: 2020-06-04 | Discharge: 2020-06-04 | Disposition: A | Discharge status: Home / Self Care | Payer: Medicare Other | Source: Ambulatory Visit | Attending: Family Medicine | Admitting: Family Medicine

## 2020-06-04 ENCOUNTER — Telehealth: Payer: Self-pay | Admitting: Family

## 2020-06-04 ENCOUNTER — Inpatient Hospital Stay: Payer: Medicare Other

## 2020-06-04 ENCOUNTER — Other Ambulatory Visit: Payer: Self-pay

## 2020-06-04 DIAGNOSIS — Z1231 Encounter for screening mammogram for malignant neoplasm of breast: Secondary | ICD-10-CM | POA: Diagnosis present

## 2020-06-04 NOTE — Telephone Encounter (Signed)
Late los 9/15 entered on 9/16,  Appointments scheduled as requested and patient was given updated schedule per 9/15 los

## 2020-06-07 ENCOUNTER — Inpatient Hospital Stay: Payer: Medicare Other

## 2020-06-07 ENCOUNTER — Other Ambulatory Visit: Payer: Self-pay

## 2020-06-07 VITALS — BP 133/80 | HR 71 | Temp 98.7°F | Resp 17

## 2020-06-07 DIAGNOSIS — D508 Other iron deficiency anemias: Secondary | ICD-10-CM

## 2020-06-07 DIAGNOSIS — D509 Iron deficiency anemia, unspecified: Secondary | ICD-10-CM | POA: Diagnosis not present

## 2020-06-07 MED ORDER — SODIUM CHLORIDE 0.9 % IV SOLN
510.0000 mg | Freq: Once | INTRAVENOUS | Status: AC
  Administered 2020-06-07: 510 mg via INTRAVENOUS
  Filled 2020-06-07: qty 510

## 2020-06-07 MED ORDER — SODIUM CHLORIDE 0.9% FLUSH
10.0000 mL | Freq: Once | INTRAVENOUS | Status: DC | PRN
  Filled 2020-06-07: qty 10

## 2020-06-07 MED ORDER — SODIUM CHLORIDE 0.9% FLUSH
3.0000 mL | Freq: Once | INTRAVENOUS | Status: DC | PRN
  Filled 2020-06-07: qty 10

## 2020-06-07 MED ORDER — SODIUM CHLORIDE 0.9 % IV SOLN
Freq: Once | INTRAVENOUS | Status: AC
  Filled 2020-06-07: qty 250

## 2020-06-07 NOTE — Patient Instructions (Signed)

## 2020-06-11 ENCOUNTER — Inpatient Hospital Stay: Payer: Medicare Other

## 2020-06-11 ENCOUNTER — Other Ambulatory Visit: Payer: Self-pay

## 2020-06-11 VITALS — BP 152/59 | HR 68 | Temp 98.5°F

## 2020-06-11 DIAGNOSIS — D509 Iron deficiency anemia, unspecified: Secondary | ICD-10-CM | POA: Diagnosis not present

## 2020-06-11 DIAGNOSIS — D508 Other iron deficiency anemias: Secondary | ICD-10-CM

## 2020-06-11 MED ORDER — SODIUM CHLORIDE 0.9 % IV SOLN
Freq: Once | INTRAVENOUS | Status: AC
  Filled 2020-06-11: qty 250

## 2020-06-11 MED ORDER — SODIUM CHLORIDE 0.9 % IV SOLN
510.0000 mg | Freq: Once | INTRAVENOUS | Status: AC
  Administered 2020-06-11: 510 mg via INTRAVENOUS
  Filled 2020-06-11: qty 510

## 2020-06-11 NOTE — Patient Instructions (Signed)

## 2020-06-18 ENCOUNTER — Other Ambulatory Visit: Payer: Self-pay | Admitting: Family Medicine

## 2020-06-25 ENCOUNTER — Telehealth: Payer: Self-pay | Admitting: Gastroenterology

## 2020-06-25 NOTE — Telephone Encounter (Signed)
Received previous records from [place] at [facility] and notable for the following:  -EGD (02/22/2018, Dr. Devoria Glassing, MD): Abnormal esophageal motility compatible with presbyesophagus,  normal esophageal mucosa, acute gastritis at GE junction, small sliding 2 cm hiatal hernia, bile induced gastritis with fluid in antrum.  Several biopsies obtained for pathology report not sent with records -Colonoscopy (02/22/2018, Dr. Devoria Glassing, MD): Normal

## 2020-06-26 NOTE — Progress Notes (Signed)
[place] at [facility] [address] [place], [place] [address] [phone]  Date:  06/28/2020   Name:  Mary Buck   DOB:  15-Jan-1936   MRN:  [id number]  PCP:  Darreld Mclean, MD    Chief Complaint: No chief complaint on file.   History of Present Illness:  Mary Buck is a 84 y.o. very pleasant female patient who presents with the following:  Pt here today for a follow-up visit-history of significant skin cancer problems, bladder cancer, spinal stenosis, hypothyroidism, hyperlipidemia, peripheral neuropathy, neurogenic claudication.   Last seen by myself in August  She is treated by [facility] for hip pain/spinal stenosis with neurogenic claudication Seen by hematology last month- they plan to give her 2 doses of Feraheme for her anemia We hope that improving her blood counts will help with her feelings of fatigue She would like to go back to PT to work on her balance - she hs been seeing Delrae Alfred at his practice She notes that her partner is worried about her memory.  Pt herself has also noticed this-she has a hard time remembering words, fact that she feels like she knows but just cannot call to the front of her mind She feels like the "connections in my brain are slow" They both feel like her sx are getting worse- would like to discuss with neurology again  She saw Dr Rexene Alberts last summer regarding her gait- I will send them a message to get her in to discuss memory  Tetanus- pt did at drug store. This was done in September per her report  She also got her covid booster  Gabapentin Prevacid Synthroid Simvastatin  Flu vaccine is done   About 4 days ago she awoke with a significant pain under her left ribs- came out of the blue This is better but not gone - nearly gone  If she moves in a particular way it will hurt some  She has been coughing recently and thinks this may have triggered the  pain.  Otherwise she is not aware of any injury or other cause  She will feel really tired after she does exercise such as walking - she is not getting CP or SOB with exercise-but will feel very tired, may have to lay down for a nap and not do much the rest of the day after exercising    Lab Results  Component Value Date   TSH 2.40 12/22/2019      Patient Active Problem List   Diagnosis Date Noted  . IDA (iron deficiency anemia) 05/31/2020  . Osteopenia 05/30/2019  . Pedal edema 01/31/2018  . Squamous cell carcinoma of skin of left lower limb, including hip 12/16/2017  . Plantar fasciitis 07/07/2017  . DDD (degenerative disc disease), lumbar 06/01/2017  . Fibromyalgia 06/01/2017  . Spinal stenosis at L4-L5 level 04/30/2017  . Peripheral neuropathy 04/30/2017  . Encounter for medication monitoring 03/05/2017  . Bladder cancer ([place]) 08/20/2015  . Cervical stenosis of spinal canal 07/27/2015  . Spinal stenosis of cervical region 07/27/2015  . Spondylolisthesis 07/27/2015  . Hypothyroid 07/05/2015  . Abdominal pain, acute, left upper quadrant 06/28/2015  . Basal cell carcinoma, face 06/28/2015  . Bilateral groin pain 06/28/2015  . Chronic pain 06/28/2015  . Fatigue 06/28/2015  . Hyperlipidemia 06/28/2015  . Pain in joints 06/28/2015  . Paresthesia of lower lip 06/28/2015  . Pulmonary nodule 06/28/2015  . Pustulosis  palmaris et plantaris 06/28/2015  . Ulcer of nose 06/28/2015  . 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  . Mass of parotid gland 03/24/2014    Past Medical History:  Diagnosis Date  . Allergy   . Asthma   . Cancer ([place])   . Cataract   . DDD (degenerative disc disease), lumbar 06/01/2017  . Depression   . GERD (gastroesophageal reflux disease)   . Heart murmur   . Hyperlipidemia   . IDA (iron deficiency anemia) 05/31/2020  . Peripheral neuropathy 04/30/2017  . Thyroid disease     Past Surgical History:  Procedure Laterality Date  . bladder cancer  2013  . CARPAL  TUNNEL RELEASE  2015  . CATARACT EXTRACTION Bilateral 2012  . CHOLECYSTECTOMY    . COLONOSCOPY WITH ESOPHAGOGASTRODUODENOSCOPY (EGD)  02/22/2018   [facility]  . SALIVARY GLAND SURGERY Left 2015   benign  . SPINE SURGERY  6/31/21   vertaflex  . TONSILLECTOMY AND ADENOIDECTOMY  1943    Social History   Tobacco Use  . Smoking status: Former Smoker    Quit date: 2015    Years since quitting: 6.7  . Smokeless tobacco: Never Used  Vaping Use  . Vaping Use: Never used  Substance Use Topics  . Alcohol use: No  . Drug use: No    Family History  Problem Relation Age of Onset  . Macular degeneration Mother   . Hyperlipidemia Father   . Stroke Father   . Cancer Brother        ? stomach  . Prostate cancer Other        in brothers  . Colon cancer Neg Hx     Allergies  Allergen Reactions  . Contrast Media [Iodinated Diagnostic Agents] Anaphylaxis  . Oxycodone Anaphylaxis    "my throat swells with any opioid"  . Latex Other (See Comments)    Other reaction(s): blistering, redness   . Mirtazapine Nausea Only  . Acitretin Rash  . Cephalexin Other (See Comments)    Unknown  . Penicillins Other (See Comments)    Unknown  . Sulfa Antibiotics Other (See Comments)    Unknown  . Sulfur Other (See Comments)    unknown    Medication list has been reviewed and updated.  Current Outpatient Medications on File Prior to Visit  Medication Sig Dispense Refill  . Cholecalciferol (VITAMIN D-3 PO) Take 1,000 Units by mouth daily.    Marland Kitchen gabapentin (NEURONTIN) 300 MG capsule Take 1 capsule (300 mg total) by mouth 3 (three) times daily. (Patient taking differently: Take 300 mg by mouth 3 (three) times daily. Third dose is 600 mg) 90 capsule 4  . lactobacillus acidophilus (BACID) TABS tablet Take 1 tablet by mouth daily.     . lansoprazole (PREVACID) 30 MG capsule TAKE ONE CAPSULE BY MOUTH TWICE DAILY BEFORE A MEAL. 180 capsule 3  . levothyroxine (SYNTHROID) 88  MCG tablet TAKE 1 TABLET(88 MCG) BY MOUTH DAILY BEFORE BREAKFAST 90 tablet 1  . misoprostol (CYTOTEC) 100 MCG tablet as directed. Takes two after breakfast and dinner    . simvastatin (ZOCOR) 20 MG tablet Take 1 tablet (20 mg total) by mouth daily. 90 tablet 3  . traMADol (ULTRAM) 50 MG tablet Take 50 mg by mouth 3 (three) times daily as needed. Takes 50 mg bid then 100 mg     No current facility-administered medications on file prior to visit.    Review of Systems:  As per HPI- otherwise negative.  Physical Examination: Vitals:   06/28/20 1050  BP: 134/60  Pulse: 64  Resp: 16  Temp: 98.4 F (36.9 C)  SpO2: 98%   Vitals:   06/28/20 1050  Weight: 164 lb (74.4 kg)  Height: 5\' 6"  (1.676 m)   Body mass index is 26.47 kg/m. Ideal Body Weight: Weight in (lb) to have BMI = 25: 154.6  GEN: no acute distress.  Looks well and her normal self.  Minimal overweight HEENT: Atraumatic, Normocephalic.  Ears and Nose: No external deformity. CV: RRR, No M/G/R. No JVD. No thrill. No extra heart sounds. PULM: CTA B, no wheezes, crackles, rhonchi. No retractions. No resp. distress. No accessory muscle use. ABD: S, NT, ND, +BS. No rebound. No HSM. EXTR: No c/c/e PSYCH: Normally interactive. Conversant.  She is tender over the left anterior, inferior ribs.  No redness or rash to suggest shingles.  She is not tender over her abdomen  Assessment and Plan: Memory change  Spinal stenosis of lumbar region, unspecified whether neurogenic claudication present  Gait instability - Plan: Ambulatory referral to Physical Therapy  Other fatigue - Plan: Ambulatory referral to Cardiology  Patient today with a couple of concerns.  One of her main worries is her memory, she feels like it is getting worse and her partner also agrees.  I have reached out to her neurologist requested that they bring her in for evaluation of this issue She has spinal stenosis and gait instability.  She feels that she has  benefited from recent physical therapy.  I referred her to continue this treatment Patient also notes significant fatigue with any physical exertion.  While this may be anemia, also consider cardiac disease.  She has not had any recent evaluation-did do a stress many years ago when living in [place].  Referral to cardiology placed Asked her to follow-up in 6 months This visit occurred during the SARS-CoV-2 public health emergency.  Safety protocols were in place, including screening questions prior to the visit, additional usage of staff PPE, and extensive cleaning of exam room while observing appropriate contact time as indicated for disinfecting solutions.    Signed Lamar Blinks, MD

## 2020-06-28 ENCOUNTER — Other Ambulatory Visit: Payer: Self-pay

## 2020-06-28 ENCOUNTER — Telehealth: Payer: Self-pay | Admitting: Family Medicine

## 2020-06-28 ENCOUNTER — Encounter: Payer: Self-pay | Admitting: Family Medicine

## 2020-06-28 ENCOUNTER — Ambulatory Visit (INDEPENDENT_AMBULATORY_CARE_PROVIDER_SITE_OTHER): Payer: Medicare Other | Admitting: Family Medicine

## 2020-06-28 VITALS — BP 134/60 | HR 64 | Temp 98.4°F | Resp 16 | Ht 66.0 in | Wt 164.0 lb

## 2020-06-28 DIAGNOSIS — R413 Other amnesia: Secondary | ICD-10-CM

## 2020-06-28 DIAGNOSIS — M48061 Spinal stenosis, lumbar region without neurogenic claudication: Secondary | ICD-10-CM | POA: Diagnosis not present

## 2020-06-28 DIAGNOSIS — R2681 Unsteadiness on feet: Secondary | ICD-10-CM

## 2020-06-28 DIAGNOSIS — R5383 Other fatigue: Secondary | ICD-10-CM | POA: Diagnosis not present

## 2020-06-28 NOTE — Telephone Encounter (Signed)
-----   Message from Star Age, MD sent at 06/28/2020  1:02 PM EDT ----- Iona Coach,   I would be happy to see her.  Do you mind putting a referral in as this is a new problem? Incidentally, we did talk about pursuing a sleep study and as you know, underlying obstructive sleep apnea may tie in with her memory concerns.  She declined sleep study testing at the time.  Nevertheless, I would like to talk to her again.  We will go over her memory evaluation and possible treatment options. sa ----- Message ----- From: Darreld Mclean, MD Sent: 06/28/2020  12:51 PM EDT To: Star Age, MD  Hi Saima,  I hope that you are well!  You last saw this patient in July 2020 I believe.  She is having concerns about her memory and wondered if you might bring her in for evaluation of this issue  Thank you so much! [place]

## 2020-06-28 NOTE — Patient Instructions (Addendum)
It was good to see you again as always!  I will contact your neurologist and ask her to reach out to you We will also set you up for a cardiology evaluation to see if this might be why you are getting so tired I ordered more PT for you today

## 2020-06-29 ENCOUNTER — Encounter: Payer: Self-pay | Admitting: Family Medicine

## 2020-06-29 NOTE — Telephone Encounter (Signed)
FYI Immunizations has been updated.

## 2020-07-03 ENCOUNTER — Encounter: Payer: Self-pay | Admitting: Family Medicine

## 2020-07-19 ENCOUNTER — Other Ambulatory Visit: Payer: Self-pay

## 2020-07-19 ENCOUNTER — Telehealth: Payer: Self-pay | Admitting: Family Medicine

## 2020-07-19 NOTE — Telephone Encounter (Signed)
Patient states she has a cough, but refuse a virtual appointment. Try to ensure patient that we will help her through the process, but she continues to refuse.

## 2020-07-19 NOTE — Telephone Encounter (Signed)
Patient needs appointment. Sent mychart message to patient.

## 2020-07-19 NOTE — Telephone Encounter (Signed)
Nurse Assessment Nurse: Gildardo Pounds, RN, Amy Date/Time Mary Buck Time): 07/19/2020 9:21:51 AM Confirm and document reason for call. If symptomatic, describe symptoms. ---Caller states she thinks she has croup. She has a cough that started a couple weeks. It is not constant. She has a croup or phlegm kind of thing. It is deeper. No fever or other symptoms. Caller has had COVID vaccine & booster. Does the patient have any new or worsening symptoms? ---Yes Will a triage be completed? ---Yes Related visit to physician within the last 2 weeks? ---No Does the PT have any chronic conditions? (i.e. diabetes, asthma, this includes High risk factors for pregnancy, etc.) ---Yes List chronic conditions. ---hypothyroidism, chronic back pain Is this a behavioral health or substance abuse call? ---No Guidelines Guideline Title Affirmed Question Affirmed Notes Nurse Date/Time (Eastern Time) Cough - Acute NonProductive [1] Patient also has allergy symptoms (e.g., itchy eyes, clear nasal discharge, postnasal drip) AND [2] they are acting up Lovelace, RN, Amy 07/19/2020 9:24:22 AM Disp. Time Mary Buck Time) Disposition Final User 07/19/2020 9:28:52 AM SEE PCP WITHIN 3 DAYS Yes Lovelace, RN, Amy PLEASE NOTE: All timestamps contained within this report are represented as [place] Standard Time. CONFIDENTIALTY NOTICE: This fax transmission is intended only for the addressee. It contains information that is legally privileged, confidential or otherwise protected from use or disclosure. If you are not the intended recipient, you are strictly prohibited from reviewing, disclosing, copying using or disseminating any of this information or taking any action in reliance on or regarding this information. If you have received this fax in error, please notify us immediately by telephone so that we can arrange for its return to [place]. Phone: [phone], Toll-Free: [phone], Fax: [phone] Page: 2 of 2 Call Id:  [id number] [place] Disagree/Comply Comply Caller Understands Yes PreDisposition InappropriateToAsk Care Advice Given Per Guideline SEE PCP WITHIN 3 DAYS: * You need to be seen within 2 or 3 days. COUGH DROPS FOR COUGH: * Cough drops can help a lot, especially for mild coughs. They reduce coughing by soothing your irritated throat and removing that tickle sensation in the back of the throat. OTC COUGH SYRUP - DEXTROMETHORPHAN: * Cough syrups containing the cough suppressant dextromethorphan (DM) may help decrease your cough. Cough syrups work best for coughs that keep you awake at night. They can also sometimes help in the late stages of a respiratory infection when the cough is dry and hacking. They can be used along with cough drops. HUMIDIFIER: * If the air is dry, use a humidifier in the bedroom. ANTIHISTAMINE MEDICATIONS FOR HAY FEVER: * Antihistamines help reduce sneezing, itching and runny nose. * LORATADINE is a newer (second generation) antihistamine. The dosage of loratadine (e.g., OTC Claritin, Alavert) is 10 mg once a day. * CETIRIZINE is a newer (second generation) antihistamine. The dosage of cetirizine (e.g., OTC Zyrtec, Reactine) is 10 mg once a day. CALL BACK IF: * Fever over 103 F (39.4 C) * Difficulty breathing occurs * You become worse CARE ADVICE given per Cough - Acute Non-Productive (Adult) guideline.

## 2020-07-20 ENCOUNTER — Encounter: Payer: Self-pay | Admitting: Cardiology

## 2020-07-20 ENCOUNTER — Other Ambulatory Visit: Payer: Self-pay

## 2020-07-20 ENCOUNTER — Ambulatory Visit (INDEPENDENT_AMBULATORY_CARE_PROVIDER_SITE_OTHER): Payer: Medicare Other | Admitting: Cardiology

## 2020-07-20 VITALS — BP 110/64 | HR 67 | Ht 65.0 in | Wt 165.0 lb

## 2020-07-20 DIAGNOSIS — I498 Other specified cardiac arrhythmias: Secondary | ICD-10-CM | POA: Diagnosis not present

## 2020-07-20 DIAGNOSIS — R5383 Other fatigue: Secondary | ICD-10-CM | POA: Diagnosis not present

## 2020-07-20 DIAGNOSIS — R011 Cardiac murmur, unspecified: Secondary | ICD-10-CM | POA: Diagnosis not present

## 2020-07-20 NOTE — Patient Instructions (Signed)
Medication Instructions:  *If you need a refill on your cardiac medications before your next appointment, please call your pharmacy*  Lab Work: Your physician has recommended that you have lab work today: Vitamin D and Vitamin B12 If you have labs (blood work) drawn today and your tests are completely normal, you will receive your results only by: Marland Kitchen MyChart Message (if you have MyChart) OR . A paper copy in the mail If you have any lab test that is abnormal or we need to change your treatment, we will call you to review the results.  Testing/Procedures: Your physician has requested that you have an echocardiogram. Echocardiography is a painless test that uses sound waves to create images of your heart. It provides your doctor with information about the size and shape of your heart and how well your heart's chambers and valves are working. This procedure takes approximately one hour. There are no restrictions for this procedure.  Follow-Up: At [facility], you and your health needs are our priority.  As part of our continuing mission to provide you with exceptional heart care, we have created designated Provider Care Teams.  These Care Teams include your primary Cardiologist (physician) and Advanced Practice Providers (APPs -  Physician Assistants and Nurse Practitioners) who all work together to provide you with the care you need, when you need it.  We recommend signing up for the patient portal called "MyChart".  Sign up information is provided on this After Visit Summary.  MyChart is used to connect with patients for Virtual Visits (Telemedicine).  Patients are able to view lab/test results, encounter notes, upcoming appointments, etc.  Non-urgent messages can be sent to your provider as well.   To learn more about what you can do with MyChart, go to [email or web address].    Your next appointment:   Your physician recommends that you schedule a follow-up appointment in: 45 WEEKS with Dr.  Harriet Masson  The format for your next appointment:   In Person with Berniece Salines, DO

## 2020-07-20 NOTE — Progress Notes (Signed)
Cardiology Office Note:    Date:  07/20/2020   ID:  Mary Buck, DOB [birth year], MRN [id number]  PCP:  Darreld Mclean, MD  Cardiologist:  Berniece Salines, DO  Electrophysiologist:  None   Referring MD: Darreld Mclean, MD   I am fatigue"  History of Present Illness:    Mary Buck is a 84 y.o. female with a hx of skin cancer, bladder cancer, hypothyroidism, high per lipidemia, peripheral neuropathy and neurogenic claudication comes today at the request of her PCP to be evaluated.  The patient tells me that she requested to be evaluated because she has had some fatigue. She offers no other complaints at this time. She denies any chest pain or shortness of breath.   Past Medical History:  Diagnosis Date  . Allergy   . Asthma   . Cancer ([place])   . Cataract   . DDD (degenerative disc disease), lumbar 06/01/2017  . Depression   . GERD (gastroesophageal reflux disease)   . Heart murmur   . Hyperlipidemia   . IDA (iron deficiency anemia) 05/31/2020  . Peripheral neuropathy 04/30/2017  . Thyroid disease     Past Surgical History:  Procedure Laterality Date  . bladder cancer  2013  . CARPAL TUNNEL RELEASE  2015  . CATARACT EXTRACTION Bilateral 2012  . CHOLECYSTECTOMY    . COLONOSCOPY WITH ESOPHAGOGASTRODUODENOSCOPY (EGD)  02/22/2018   [facility]  . SALIVARY GLAND SURGERY Left 2015   benign  . SPINE SURGERY  6/31/21   vertaflex  . TONSILLECTOMY AND ADENOIDECTOMY  1943    Current Medications: Current Meds  Medication Sig  . acitretin (SORIATANE) 10 MG capsule Take 10 mg by mouth 3 (three) times a week.  . Cholecalciferol (VITAMIN D-3 PO) Take 1,000 Units by mouth daily.  Marland Kitchen gabapentin (NEURONTIN) 300 MG capsule Take 1 capsule (300 mg total) by mouth 3 (three) times daily. (Patient taking differently: Take 300 mg by mouth 3 (three) times daily. Third dose is 600 mg)  . lactobacillus acidophilus (BACID) TABS tablet Take 1 tablet by  mouth daily.   . lansoprazole (PREVACID) 30 MG capsule TAKE ONE CAPSULE BY MOUTH TWICE DAILY BEFORE A MEAL.  Marland Kitchen levothyroxine (SYNTHROID) 88 MCG tablet TAKE 1 TABLET(88 MCG) BY MOUTH DAILY BEFORE BREAKFAST  . misoprostol (CYTOTEC) 100 MCG tablet as directed. Takes two after breakfast and dinner  . simvastatin (ZOCOR) 20 MG tablet Take 1 tablet (20 mg total) by mouth daily.  . traMADol (ULTRAM) 50 MG tablet Take 50 mg by mouth 3 (three) times daily as needed. Takes 50 mg bid then 100 mg     Allergies:   Contrast media [iodinated diagnostic agents], Oxycodone, Latex, Mirtazapine, Acitretin, Cephalexin, Penicillins, Sulfa antibiotics, and Sulfur   Social History   Socioeconomic History  . Marital status: Significant Other    Spouse name: Not on file  . Number of children: 0  . Years of education: Not on file  . Highest education level: Not on file  Occupational History  . Occupation: retired  Tobacco Use  . Smoking status: Former Smoker    Quit date: 2015    Years since quitting: 6.8  . Smokeless tobacco: Never Used  Vaping Use  . Vaping Use: Never used  Substance and Sexual Activity  . Alcohol use: No  . Drug use: No  . Sexual activity: Not on file  Other Topics Concern  . Not on file  Social History Narrative  . Not  on file   Social Determinants of Health   Financial Resource Strain: Low Risk   . Difficulty of Paying Living Expenses: Not hard at all  Food Insecurity: No Food Insecurity  . Worried About Charity fundraiser in the Last Year: Never true  . Ran Out of Food in the Last Year: Never true  Transportation Needs: No Transportation Needs  . Lack of Transportation (Medical): No  . Lack of Transportation (Non-Medical): No  Physical Activity:   . Days of Exercise per Week: Not on file  . Minutes of Exercise per Session: Not on file  Stress:   . Feeling of Stress : Not on file  Social Connections:   . Frequency of Communication with Friends and Family: Not on file    . Frequency of Social Gatherings with Friends and Family: Not on file  . Attends Religious Services: Not on file  . Active Member of Clubs or Organizations: Not on file  . Attends Archivist Meetings: Not on file  . Marital Status: Not on file     Family History: The patient's family history includes Cancer in her brother; Hyperlipidemia in her father; Macular degeneration in her mother; Prostate cancer in an other family member; Stroke in her father. There is no history of Colon cancer.  ROS:   Review of Systems  Constitution: Negative for decreased appetite, fever and weight gain.  HENT: Negative for congestion, ear discharge, hoarse voice and sore throat.   Eyes: Negative for discharge, redness, vision loss in right eye and visual halos.  Cardiovascular: Reports fatigue negative for chest pain, dyspnea on exertion, leg swelling, orthopnea and palpitations.  Respiratory: Negative for cough, hemoptysis, shortness of breath and snoring.   Endocrine: Negative for heat intolerance and polyphagia.  Hematologic/Lymphatic: Negative for bleeding problem. Does not bruise/bleed easily.  Skin: Negative for flushing, nail changes, rash and suspicious lesions.  Musculoskeletal: Negative for arthritis, joint pain, muscle cramps, myalgias, neck pain and stiffness.  Gastrointestinal: Negative for abdominal pain, bowel incontinence, diarrhea and excessive appetite.  Genitourinary: Negative for decreased libido, genital sores and incomplete emptying.  Neurological: Negative for brief paralysis, focal weakness, headaches and loss of balance.  Psychiatric/Behavioral: Negative for altered mental status, depression and suicidal ideas.  Allergic/Immunologic: Negative for HIV exposure and persistent infections.    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  Recent Labs: 12/22/2019: Pro B Natriuretic peptide (BNP) 94.0; TSH 2.40 05/30/2020: ALT 10; BUN 15; Creatinine 0.92; Hemoglobin 10.3; Platelet Count 222; Potassium 4.1; Sodium 143  Recent Lipid Panel    Component Value Date/Time   CHOL 175 05/02/2019 1542   TRIG 202.0 (H) 05/02/2019 1542   HDL 51.50 05/02/2019 1542   CHOLHDL 3 05/02/2019 1542   VLDL 40.4 (H) 05/02/2019 1542   LDLCALC 76 08/03/2017 1310   LDLDIRECT 95.0 05/02/2019 1542    Physical Exam:    VS:  BP 110/64 (BP Location: Right Arm)   Pulse 67   Ht 5\' 5"  (1.651 m)   Wt 165 lb (74.8 kg)   SpO2 98%   BMI 27.46 kg/m     Wt Readings from Last 3 Encounters:  07/20/20 165 lb (74.8 kg)  06/28/20 164 lb (74.4 kg)  05/30/20 163 lb 6.4 oz (74.1 kg)     GEN: Well nourished, well developed in no acute distress HEENT: Normal NECK: No JVD; No carotid bruits LYMPHATICS: No lymphadenopathy CARDIAC: S1S2 UJWJX,BJY,7/8 midsystolic ejection murmurs, rubs, gallops RESPIRATORY:  Clear to auscultation without rales, wheezing or rhonchi  ABDOMEN: Soft, non-tender, non-distended, +bowel sounds, no guarding. EXTREMITIES: No edema, No cyanosis, no clubbing MUSCULOSKELETAL:  No deformity  SKIN: Warm and dry NEUROLOGIC:  Alert and oriented x 3, non-focal PSYCHIATRIC:  Normal affect, good insight  ASSESSMENT:    1. Other fatigue   2. Murmur, cardiac   3. Sinus arrhythmia    PLAN:     I like to send the patient for transthoracic echocardiogram to assess for any valvular abnormality in the setting of her systolic murmur as well as her fatigue. In addition we will get vitamin B12 level as well as vitamin D levels. We talked about her abnormal EKG and given the fact that she does not have any symptoms she would prefer not to pursue any further testing.  For now we will await her echo  results and further recommendation will be made from there.  The patient is in agreement with the above plan. The patient left the office in stable condition.  The patient will follow up in 8 weeks or sooner if needed.   Medication Adjustments/Labs and Tests Ordered: Current medicines are reviewed at length with the patient today.  Concerns regarding medicines are outlined above.  Orders Placed This Encounter  Procedures  . Vitamin B12  . Vitamin D (25 hydroxy)  . EKG 12-Lead  . ECHOCARDIOGRAM COMPLETE   No orders of the defined types were placed in this encounter.   Patient Instructions  Medication Instructions:  *If you need a refill on your cardiac medications before your next appointment, please call your pharmacy*  Lab Work: Your physician has recommended that you have lab work today: Vitamin D and Vitamin B12 If you have labs (blood work) drawn today and your tests are completely normal, you will receive your results only by: Marland Kitchen MyChart Message (if you have MyChart) OR . A paper copy in the mail If you have any lab test that is abnormal or we need to change your treatment, we will call you to review the results.  Testing/Procedures: Your physician has requested that you have an echocardiogram. Echocardiography is a painless test that uses sound waves to create images of your heart. It provides your doctor with information about the size and shape of your heart and how well your heart's chambers and valves are working. This procedure takes approximately one hour. There are no restrictions for this procedure.  Follow-Up: At [facility], you and your health needs are our priority.  As part of our continuing mission to provide you with exceptional heart care, we have created designated Provider Care Teams.  These Care Teams include your primary Cardiologist (physician) and Advanced Practice Providers (APPs -  Physician Assistants and Nurse Practitioners) who all work together to  provide you with the care you need, when you need it.  We recommend signing up for the patient portal called "MyChart".  Sign up information is provided on this After Visit Summary.  MyChart is used to connect with patients for Virtual Visits (Telemedicine).  Patients are able to view lab/test results, encounter notes, upcoming appointments, etc.  Non-urgent messages can be sent to your provider as well.   To learn more about what you can do with MyChart,  go to [email or web address].    Your next appointment:   Your physician recommends that you schedule a follow-up appointment in: 47 WEEKS with Dr. Harriet Masson  The format for your next appointment:   In Person with Berniece Salines, DO        Adopting a Healthy Lifestyle.  Know what a healthy weight is for you (roughly BMI <25) and aim to maintain this   Aim for 7+ servings of fruits and vegetables daily   65-80+ fluid ounces of water or unsweet tea for healthy kidneys   Limit to max 1 drink of alcohol per day; avoid smoking/tobacco   Limit animal fats in diet for cholesterol and heart health - choose grass fed whenever available   Avoid highly processed foods, and foods high in saturated/trans fats   Aim for low stress - take time to unwind and care for your mental health   Aim for 150 min of moderate intensity exercise weekly for heart health, and weights twice weekly for bone health   Aim for 7-9 hours of sleep daily   When it comes to diets, agreement about the perfect plan isnt easy to find, even among the experts. Experts at the [employer] developed an idea known as the Healthy Eating Plate. Just imagine a plate divided into logical, healthy portions.   The emphasis is on diet quality:   Load up on vegetables and fruits - one-half of your plate: Aim for color and variety, and remember that potatoes dont count.   Go for whole grains - one-quarter of your plate: Whole wheat, barley, wheat berries, quinoa,  oats, brown rice, and foods made with them. If you want pasta, go with whole wheat pasta.   Protein power - one-quarter of your plate: Fish, chicken, beans, and nuts are all healthy, versatile protein sources. Limit red meat.   The diet, however, does go beyond the plate, offering a few other suggestions.   Use healthy plant oils, such as olive, canola, soy, corn, sunflower and peanut. Check the labels, and avoid partially hydrogenated oil, which have unhealthy trans fats.   If youre thirsty, drink water. Coffee and tea are good in moderation, but skip sugary drinks and limit milk and dairy products to one or two daily servings.   The type of carbohydrate in the diet is more important than the amount. Some sources of carbohydrates, such as vegetables, fruits, whole grains, and beans-are healthier than others.   Finally, stay active  Signed, Berniece Salines, DO  07/20/2020 2:58 PM    [place]

## 2020-07-21 LAB — VITAMIN D 25 HYDROXY (VIT D DEFICIENCY, FRACTURES): Vit D, 25-Hydroxy: 34 ng/mL (ref 30.0–100.0)

## 2020-07-21 LAB — VITAMIN B12: Vitamin B-12: 619 pg/mL (ref 232–1245)

## 2020-07-23 ENCOUNTER — Telehealth: Payer: Self-pay

## 2020-07-23 NOTE — Telephone Encounter (Signed)
-----   Message from Berniece Salines, DO sent at 07/21/2020 12:40 PM EDT ----- Vitamin B12 normal, along with vitamin D level normal

## 2020-07-23 NOTE — Telephone Encounter (Signed)
Left message on patients voicemail to please return our call.   

## 2020-07-24 ENCOUNTER — Telehealth: Payer: Self-pay

## 2020-07-24 DIAGNOSIS — M47816 Spondylosis without myelopathy or radiculopathy, lumbar region: Secondary | ICD-10-CM | POA: Diagnosis not present

## 2020-07-24 NOTE — Telephone Encounter (Signed)
-----   Message from Berniece Salines, DO sent at 07/21/2020 12:40 PM EDT ----- Vitamin B12 normal, along with vitamin D level normal

## 2020-07-24 NOTE — Telephone Encounter (Signed)
Spoke with patient regarding results and recommendation.  Patient verbalizes understanding and is agreeable to plan of care. Advised patient to call back with any issues or concerns.  

## 2020-07-25 ENCOUNTER — Other Ambulatory Visit: Payer: Self-pay

## 2020-07-25 ENCOUNTER — Inpatient Hospital Stay: Payer: Medicare Other | Attending: Hematology & Oncology

## 2020-07-25 ENCOUNTER — Telehealth: Payer: Self-pay

## 2020-07-25 ENCOUNTER — Inpatient Hospital Stay (HOSPITAL_BASED_OUTPATIENT_CLINIC_OR_DEPARTMENT_OTHER): Payer: Medicare Other | Admitting: Family

## 2020-07-25 ENCOUNTER — Encounter: Payer: Self-pay | Admitting: Family

## 2020-07-25 VITALS — BP 132/64 | HR 69 | Temp 98.0°F | Wt 162.8 lb

## 2020-07-25 DIAGNOSIS — D509 Iron deficiency anemia, unspecified: Secondary | ICD-10-CM | POA: Insufficient documentation

## 2020-07-25 DIAGNOSIS — D508 Other iron deficiency anemias: Secondary | ICD-10-CM

## 2020-07-25 DIAGNOSIS — Z79899 Other long term (current) drug therapy: Secondary | ICD-10-CM | POA: Diagnosis not present

## 2020-07-25 LAB — RETICULOCYTES
Immature Retic Fract: 8.3 % (ref 2.3–15.9)
RBC.: 4.29 MIL/uL (ref 3.87–5.11)
Retic Count, Absolute: 54.5 10*3/uL (ref 19.0–186.0)
Retic Ct Pct: 1.3 % (ref 0.4–3.1)

## 2020-07-25 LAB — CBC WITH DIFFERENTIAL (CANCER CENTER ONLY)
Abs Immature Granulocytes: 0.06 10*3/uL (ref 0.00–0.07)
Basophils Absolute: 0.1 10*3/uL (ref 0.0–0.1)
Basophils Relative: 1 %
Eosinophils Absolute: 0.4 10*3/uL (ref 0.0–0.5)
Eosinophils Relative: 8 %
HCT: 40.1 % (ref 36.0–46.0)
Hemoglobin: 12.5 g/dL (ref 12.0–15.0)
Immature Granulocytes: 1 %
Lymphocytes Relative: 27 %
Lymphs Abs: 1.4 10*3/uL (ref 0.7–4.0)
MCH: 29.4 pg (ref 26.0–34.0)
MCHC: 31.2 g/dL (ref 30.0–36.0)
MCV: 94.4 fL (ref 80.0–100.0)
Monocytes Absolute: 0.5 10*3/uL (ref 0.1–1.0)
Monocytes Relative: 10 %
Neutro Abs: 2.9 10*3/uL (ref 1.7–7.7)
Neutrophils Relative %: 53 %
Platelet Count: 214 10*3/uL (ref 150–400)
RBC: 4.25 MIL/uL (ref 3.87–5.11)
RDW: 15 % (ref 11.5–15.5)
WBC Count: 5.4 10*3/uL (ref 4.0–10.5)
nRBC: 0 % (ref 0.0–0.2)

## 2020-07-25 NOTE — Telephone Encounter (Signed)
Called and left a message with 10/25/20 appts per 07/25/20 los... AOM

## 2020-07-25 NOTE — Progress Notes (Signed)
Hematology and Oncology Follow Up Visit  Mary Buck [id number] 03-03-1936 84 y.o. 07/25/2020   Principle Diagnosis:  Iron deficiency anemia   Current Therapy:   IV iron as indicated    Interim History:  Mary Buck is here today for follow-up and treatment. She tolerated her iron infusions well and notes that her energy does seem better at times.  She has not noted any blood loss. No bruising or petechiae.  She got the first of 2 trial nerve blocks yesterday and this was quite a painful experience for her.   She has numbness and tingling in her toes that comes and goes.  No swelling in her extremities at this time.  No falls or syncopal episodes to report. She ambulates with a cane for added support.   No fever, chills, n/v, cough, rash, dizziness, SOB, chest pain, palpitations, abdominal pain or changes in bowel or bladder habits.  She has slight diarrhea at times after eating.  Her appetite comes and goes but she does feel that she is hydrating well. She drinks mostly coffee and usually one bottle of water a day with her medicine. Her weight is stable at 162 lbs.   ECOG Performance Status: 1 - Symptomatic but completely ambulatory  Medications:  Allergies as of 07/25/2020      Reactions   Contrast Media [iodinated Diagnostic Agents] Anaphylaxis   Oxycodone Anaphylaxis   "my throat swells with any opioid"   Latex Other (See Comments)   Other reaction(s): blistering, redness    Mirtazapine Nausea Only   Acitretin Rash   Cephalexin Other (See Comments)   Unknown   Penicillins Other (See Comments)   Unknown   Sulfa Antibiotics Other (See Comments)   Unknown   Sulfur Other (See Comments)   unknown      Medication List       Accurate as of July 25, 2020 11:56 AM. If you have any questions, ask your nurse or doctor.        acitretin 10 MG capsule Commonly known as: SORIATANE Take 10 mg by mouth 3 (three) times a week.   gabapentin 300 MG  capsule Commonly known as: NEURONTIN Take 1 capsule (300 mg total) by mouth 3 (three) times daily. What changed: additional instructions   lactobacillus acidophilus Tabs tablet Take 1 tablet by mouth daily.   lansoprazole 30 MG capsule Commonly known as: PREVACID TAKE ONE CAPSULE BY MOUTH TWICE DAILY BEFORE A MEAL.   levothyroxine 88 MCG tablet Commonly known as: SYNTHROID TAKE 1 TABLET(88 MCG) BY MOUTH DAILY BEFORE BREAKFAST   misoprostol 100 MCG tablet Commonly known as: CYTOTEC as directed. Takes two after breakfast and dinner   simvastatin 20 MG tablet Commonly known as: ZOCOR Take 1 tablet (20 mg total) by mouth daily.   traMADol 50 MG tablet Commonly known as: ULTRAM Take 50 mg by mouth 3 (three) times daily as needed. Takes 50 mg bid then 100 mg   VITAMIN D-3 PO Take 1,000 Units by mouth daily.       Allergies:  Allergies  Allergen Reactions  . Contrast Media [Iodinated Diagnostic Agents] Anaphylaxis  . Oxycodone Anaphylaxis    "my throat swells with any opioid"  . Latex Other (See Comments)    Other reaction(s): blistering, redness   . Mirtazapine Nausea Only  . Acitretin Rash  . Cephalexin Other (See Comments)    Unknown  . Penicillins Other (See Comments)    Unknown  . Sulfa Antibiotics Other (See Comments)  Unknown  . Sulfur Other (See Comments)    unknown    Past Medical History, Surgical history, Social history, and Family History were reviewed and updated.  Review of Systems: All other 10 point review of systems is negative.   Physical Exam:  vitals were not taken for this visit.   Wt Readings from Last 3 Encounters:  07/20/20 165 lb (74.8 kg)  06/28/20 164 lb (74.4 kg)  05/30/20 163 lb 6.4 oz (74.1 kg)    Ocular: Sclerae unicteric, pupils equal, round and reactive to light Ear-nose-throat: Oropharynx clear, dentition fair Lymphatic: No cervical or supraclavicular adenopathy Lungs no rales or rhonchi, good excursion  bilaterally Heart regular rate and rhythm, no murmur appreciated Abd soft, nontender, positive bowel sounds MSK no focal spinal tenderness, no joint edema Neuro: non-focal, well-oriented, appropriate affect Breasts: Deferred   Lab Results  Component Value Date   WBC 5.4 07/25/2020   HGB 12.5 07/25/2020   HCT 40.1 07/25/2020   MCV 94.4 07/25/2020   PLT 214 07/25/2020   Lab Results  Component Value Date   FERRITIN 8 (L) 05/30/2020   IRON 41 05/30/2020   TIBC 315 05/30/2020   UIBC 274 05/30/2020   IRONPCTSAT 13 (L) 05/30/2020   Lab Results  Component Value Date   RETICCTPCT 1.3 07/25/2020   RBC 4.29 07/25/2020   No results found for: KPAFRELGTCHN, LAMBDASER, KAPLAMBRATIO No results found for: IGGSERUM, IGA, IGMSERUM No results found for: Ronnald Ramp, A1GS, A2GS, Violet Baldy, MSPIKE, SPEI   Chemistry      Component Value Date/Time   NA 143 05/30/2020 1453   K 4.1 05/30/2020 1453   CL 105 05/30/2020 1453   CO2 33 (H) 05/30/2020 1453   BUN 15 05/30/2020 1453   CREATININE 0.92 05/30/2020 1453      Component Value Date/Time   CALCIUM 8.8 (L) 05/30/2020 1453   ALKPHOS 72 05/30/2020 1453   AST 15 05/30/2020 1453   ALT 10 05/30/2020 1453   BILITOT 0.4 05/30/2020 1453       Impression and Plan: Mary Buck is a very pleasant 84 yo female with history of iron deficiency anemia.  Hgb is improved at 12.5 (previously 10.3) and MCV 94.  We will see what her iron studies look like today and replace again if needed.  Follow-up in 3 months.  She was encouraged to contact our office with any questions or concerns. We can certainly check labs in between visit if she develops symptoms.   Laverna Peace, NP 11/10/202111:56 AM

## 2020-07-26 ENCOUNTER — Ambulatory Visit (INDEPENDENT_AMBULATORY_CARE_PROVIDER_SITE_OTHER): Payer: Medicare Other | Admitting: Neurology

## 2020-07-26 ENCOUNTER — Encounter: Payer: Self-pay | Admitting: Neurology

## 2020-07-26 VITALS — BP 138/61 | HR 67 | Ht 65.0 in | Wt 164.0 lb

## 2020-07-26 DIAGNOSIS — R4789 Other speech disturbances: Secondary | ICD-10-CM | POA: Diagnosis not present

## 2020-07-26 DIAGNOSIS — R413 Other amnesia: Secondary | ICD-10-CM

## 2020-07-26 DIAGNOSIS — R0683 Snoring: Secondary | ICD-10-CM

## 2020-07-26 DIAGNOSIS — G479 Sleep disorder, unspecified: Secondary | ICD-10-CM | POA: Diagnosis not present

## 2020-07-26 LAB — IRON AND TIBC
Iron: 97 ug/dL (ref 41–142)
Saturation Ratios: 48 % (ref 21–57)
TIBC: 200 ug/dL — ABNORMAL LOW (ref 236–444)
UIBC: 104 ug/dL — ABNORMAL LOW (ref 120–384)

## 2020-07-26 LAB — FERRITIN: Ferritin: 177 ng/mL (ref 11–307)

## 2020-07-26 NOTE — Patient Instructions (Signed)
You have complaints of memory loss: memory loss or changes in cognitive function can have many reasons and does not always mean you have dementia. Conditions that can contribute to subjective or objective memory loss include: depression, stress, poor sleep from insomnia or sleep apnea, dehydration, fluctuation in blood sugar values, thyroid or electrolyte dysfunction and certain vitamin deficiencies. Dementia can be caused by stroke, brain atherosclerosis or brain vascular disease due to vascular risk factors (smoking, high blood pressure, high cholesterol, obesity and uncontrolled diabetes), certain degenerative brain disorders (including Parkinson's disease and Multiple sclerosis) and by Alzheimer's disease or other, more rare and sometimes hereditary causes. We will do some additional testing: blood work (which has been done recently already) and we will do a brain scan. We will not start medication as yet. We will also request a formal cognitive test called neuropsychological evaluation which is done by a licensed neuropsychologist. We will make a referral in that regard. We will call you with brain scan test results and monitor your symptoms. Your memory loss is rather mild at this point, which, of course is reassuring.   We will also proceed with a home sleep test to rule out obstructive sleep apnea. Even, if you have mild OSA, I may want you to consider treatment with CPAP, as treatment of even borderline or mild sleep apnea can result and improvement of symptoms such as sleep disruption, daytime sleepiness, nighttime bathroom breaks, restless leg symptoms, improvement of headache syndromes, even improved mood disorder.   Please remember, the long-term risks and ramifications of untreated moderate to severe obstructive sleep apnea are: increased Cardiovascular disease, including congestive heart failure, stroke, difficult to control hypertension, treatment resistant obesity, arrhythmias, especially  irregular heartbeat commonly known as A. Fib. (atrial fibrillation); even type 2 diabetes has been linked to untreated OSA.

## 2020-07-26 NOTE — Progress Notes (Signed)
Subjective:    Patient ID: Mary Buck is a 84 y.o. female.  HPI     Interim history:   Mary Buck is an 84 year old right-handed woman with an underlying complex medical history of lumbar spinal stenosis, history of bladder cancer, cervical spinal stenosis and degenerative spine disease, hypothyroidism, obesity, gait d/o, fibromyalgia, history of neuropathy, history of squamous cell cancer with status post radiation treatment to the left leg as well as treatment with acitretin, who Presents for a new problem visit for memory loss.  She is referred by her primary care physician, Dr. Lorelei Pont.   She reports a several week or several month history of short-term memory issues particularly forgetting conversations or details of a conversation, word finding difficulty and difficulty recalling names.  She has no obvious family history of dementia.  Both parents lived to be in their 44s, father died at 20 and mom back to be 23.  She is the middle child of 5 total kids, she has 2 older brothers and 2 younger brothers.  She continues to have balance issues and feels unsteady walking.  She uses a cane.  She broke her right humerus and had to have a sling and prolonged physical therapy.  She is retired from [employer], [employer] and social work.  She likes to write poetry.  She lives in [place] with her partner.  She likes to drink coffee, about 6 cups/day.  She does not drink water very much, she has anxiety when drinking water, she can only drink sips at a time.  She does not take her meds with water, she crushes them and eats them with yogurt.  She recalls having had some form of memory testing in [place] some 5 years ago.  She can look for the records.  She reports anxiety and depression.  She is currently not on any medication for this.  She used to see a psychiatrist.  She has tried multiple medications but did not do well on any of them.  She had a recent fall in March of this year.  She had a head CT  and maxillofacial CT without contrast on 11/22/2019 and I reviewed the results: IMPRESSION: 1. No acute intracranial pathology. Small-vessel white matter disease.   2. Minimally displaced tripod morphology fractures of the right maxillary sinus walls and orbital floor. The zygomatic arch is intact. No other displaced fractures of the facial bones.   I had evaluated her last year for balance issues.  She had not had any recent falls at the time, but felt that she had worsening balance over the prior several months.  She had a brain MRI in July 2019 which showed some atrophy and chronic microvascular changes.  We had talked about her sleep difficulties as well, she reported snoring and some degree of daytime sleepiness.  She declined a sleep study at the time.  She would be willing to consider a home sleep test.  Epworth sleepiness score is 12 out of 24.  She had recent blood work including vitamin B12, vitamin D, this was done on 07/20/2020 and she also had additional iron studies yesterday.  TSH was normal in April 2021.   Previously (copied from previous notes for reference):   03/17/19: (She) reports worsening balance problems for the past few months.  Thankfully she has not fallen.  She fell in December 2018.  She reports forgetfulness and daytime sleepiness.  She takes tramadol 50 mg 3 times daily and also takes gabapentin, 300 mg twice  daily during the day and 600 mg at night.  She denies a family history of Parkinson's disease.  She has wondered if she has Parkinson's-like changes.  She snores according to her partner.  She has never had a sleep study. She had a brain MRI without contrast on 04/05/2018 through [facility] and I reviewed the results:  IMPRESSION: Atrophy and chronic microvascular ischemic change in the white matter. No acute abnormality.  She had a cervical spine and lumbar spine MRI without contrast in October 2016 when she was in [place].  I was able to review the report she  brought: Study date was 07/09/2015: She had disc protrusion at L2-3, prominent disc bulge and spondylitic changes at L4-5 with moderate bilateral neuroforaminal narrowing and mild bilateral lateral recess stenosis, at L5-S1 she had diffuse disc bulge and bony spinal changes causing moderate to marked bilateral neural foraminal narrowing without canal stenosis.  She had a prior lumbar spine MRI in April 2012 which was reference in this report.  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  She had no evidence of focal cord compression or abnormal cord signal at the time.  She follows with a pain management physician through [facility]. She is retired, she lives with her partner, she quit smoking in July 2015, she does not drink alcohol, she likes to drink caffeine in the form of coffee, about 5 to 6 cups/day.  She does not drink a whole lot of water, estimates that she drinks about 16 ounces per day.  She has radiating lower back pain to the left leg.  She has an area of numbness in the left chin area where she received radiation therapy.  Her Past Medical History Is Significant For: Past Medical History:  Diagnosis Date  . Allergy   . Asthma   . Cancer ([place])   . Cataract   . DDD (degenerative disc disease), lumbar 06/01/2017  . Depression   . GERD (gastroesophageal reflux disease)   . Heart murmur   . Hyperlipidemia   . IDA (iron deficiency anemia) 05/31/2020  . Peripheral neuropathy 04/30/2017  . Thyroid disease     Her Past Surgical History Is Significant For: Past Surgical History:  Procedure Laterality Date  . bladder cancer  2013  . CARPAL TUNNEL RELEASE  2015  . CATARACT EXTRACTION Bilateral 2012  .  CHOLECYSTECTOMY    . COLONOSCOPY WITH ESOPHAGOGASTRODUODENOSCOPY (EGD)  02/22/2018   [facility]  . SALIVARY GLAND SURGERY Left 2015   benign  . SPINE SURGERY  6/31/21   vertaflex  . TONSILLECTOMY AND ADENOIDECTOMY  1943    Her Family History Is Significant For: Family History  Problem Relation Age of Onset  . Macular degeneration Mother   . Hyperlipidemia Father   . Stroke Father   . Cancer Brother        ? stomach  . Prostate cancer Other        in brothers  . Colon cancer Neg Hx     Her Social History Is Significant For: Social History   Socioeconomic History  . Marital status: Significant Other    Spouse name: Not on file  . Number of children: 0  . Years of education: Not on file  .  Highest education level: Not on file  Occupational History  . Occupation: retired  Tobacco Use  . Smoking status: Former Smoker    Quit date: 2015    Years since quitting: 6.8  . Smokeless tobacco: Never Used  Vaping Use  . Vaping Use: Never used  Substance and Sexual Activity  . Alcohol use: No  . Drug use: No  . Sexual activity: Not on file  Other Topics Concern  . Not on file  Social History Narrative  . Not on file   Social Determinants of Health   Financial Resource Strain: Low Risk   . Difficulty of Paying Living Expenses: Not hard at all  Food Insecurity: No Food Insecurity  . Worried About Charity fundraiser in the Last Year: Never true  . Ran Out of Food in the Last Year: Never true  Transportation Needs: No Transportation Needs  . Lack of Transportation (Medical): No  . Lack of Transportation (Non-Medical): No  Physical Activity:   . Days of Exercise per Week: Not on file  . Minutes of Exercise per Session: Not on file  Stress:   . Feeling of Stress : Not on file  Social Connections:   . Frequency of Communication with Friends and Family: Not on file  . Frequency of Social Gatherings with Friends and Family: Not on file  .  Attends Religious Services: Not on file  . Active Member of Clubs or Organizations: Not on file  . Attends Archivist Meetings: Not on file  . Marital Status: Not on file    Her Allergies Are:  Allergies  Allergen Reactions  . Contrast Media [Iodinated Diagnostic Agents] Anaphylaxis  . Oxycodone Anaphylaxis    "my throat swells with any opioid"  . Latex Other (See Comments)    Other reaction(s): blistering, redness   . Mirtazapine Nausea Only  . Acitretin Rash  . Cephalexin Other (See Comments)    Unknown  . Penicillins Other (See Comments)    Unknown  . Sulfa Antibiotics Other (See Comments)    Unknown  . Sulfur Other (See Comments)    unknown  :   Her Current Medications Are:  Outpatient Encounter Medications as of 07/26/2020  Medication Sig  . acitretin (SORIATANE) 10 MG capsule Take 10 mg by mouth 3 (three) times a week.  . Cholecalciferol (VITAMIN D-3 PO) Take 1,000 Units by mouth daily.  Marland Kitchen lactobacillus acidophilus (BACID) TABS tablet Take 1 tablet by mouth daily.   . lansoprazole (PREVACID) 30 MG capsule TAKE ONE CAPSULE BY MOUTH TWICE DAILY BEFORE A MEAL.  Marland Kitchen levothyroxine (SYNTHROID) 88 MCG tablet TAKE 1 TABLET(88 MCG) BY MOUTH DAILY BEFORE BREAKFAST  . misoprostol (CYTOTEC) 100 MCG tablet as directed. Takes two after breakfast and dinner  . simvastatin (ZOCOR) 20 MG tablet Take 1 tablet (20 mg total) by mouth daily.  . traMADol (ULTRAM) 50 MG tablet Take 50 mg by mouth 3 (three) times daily as needed. Takes 50 mg bid then 100 mg  . gabapentin (NEURONTIN) 300 MG capsule Take 1 capsule (300 mg total) by mouth 3 (three) times daily. (Patient not taking: Reported on 07/26/2020)   No facility-administered encounter medications on file as of 07/26/2020.  :  Review of Systems:  Out of a complete 14 point review of systems, all are reviewed and negative with the exception of these symptoms as listed below: Review of Systems  Neurological:       RM 2, alone.  Internal referral from [facility]  [place],MD for memory changes. She has diarrhea that started this morning. Had inj for pain in her back this past Tuesday.  AFT's- 13 animals with 4 legs in 1 min MMSE: 30/30 (18 years of education)    Objective:  Neurological Exam  Physical Exam Physical Examination:   Vitals:   07/26/20 1017  BP: 138/61  Pulse: 67    General Examination: The patient is a very pleasant 84 y.o. female in no acute distress. She appears well-developed and well-nourished and well groomed.   HEENT: Normocephalic, atraumatic, pupils are equal, round and reactive to light and accommodation. Extraocular tracking is preserved. Hearing is grossly intact. Face is symmetric with normal facial animation and normal facial sensation. Speech is clear with no dysarthria noted. There is no hypophonia. There is no lip, neck/head, jaw or voice tremor. Neck is supple with full range of passive and active motion. There are no carotid bruits on auscultation. Oropharynx exam reveals: moderate mouth dryness, adequate dental hygiene and moderate airway crowding.  Tongue protrudes centrally in palate elevates symmetrically.  She has a mild anterior flexion of her neck.  Chest: Clear to auscultation without wheezing, rhonchi or crackles noted.  Heart: S1+S2+0, regular and normal without murmurs, rubs or gallops noted.   Abdomen: Soft, non-tender and non-distended with normal bowel sounds appreciated on auscultation.  Extremities: There is no pitting edema in the distal lower extremities bilaterally.   Skin: Warm and dry without trophic changes noted.  Musculoskeletal: exam reveals arthritic changes in both hands.  Neurologically:  Mental status: The patient is awake, alert and oriented in all 4 spheres. Her immediate and remote memory, attention, language skills and fund of knowledge are appropriate. There is no evidence of aphasia, agnosia, apraxia or anomia. Speech is clear with normal  prosody and enunciation. Thought process is linear. Mood is normal and affect is normal.   On 07/26/2020: MMSE: 30/30, AFT: 13/min.  Cranial nerves II - XII are as described above under HEENT exam. In addition: shoulder shrug is normal with equal shoulder height noted.   Motor exam: Normal bulk, strength and tone is noted. There is no drift, tremor or rebound. Romberg is negative. Reflexes are 1+ throughout. Babinski: Toes are flexor bilaterally. Fine motor skills and coordination: intact with normal finger taps, normal hand movements, normal rapid alternating patting, normal foot taps and normal foot agility.  Cerebellar testing: No dysmetria or intention tremor on finger to nose testing. Heel to shin is unremarkable bilaterally. There is no truncal or gait ataxia.  Sensory exam: intact to light touch.  Gait, station and balance: She stands With mild difficulty, posture is mildly stooped, could be appropriate for age, But more in keeping with increase in lumbar kyphosis.  She walks with slight insecurity but preserved arm swing, no shuffling, no freezing  Assessment and Plan:   In summary, Talishia Betzler is a very pleasant 84 year old female with an underlying complex medical history of lumbar spinal stenosis, history of bladder cancer, cervical spinal stenosis and degenerative spine disease, hypothyroidism, balance problem, history of fall, obesity, fibromyalgia, history of neuropathy, history of squamous cell cancer with status post radiation treatment to the left leg as well as treatment with acitretin, who presents for evaluation of her memory loss.  She reports forgetfulness, short-term memory issues, difficulty with word finding and name recall.  Her memory scores are good today.  Nevertheless, further evaluation is feasible.  We had talked about pursuing sleep testing last time we met due to sleep  difficulty and snoring reported.  I advised her that obstructive sleep apnea can tie in with  cognitive issues.  She would be willing to pursue a home sleep test.  I explained that test procedure to her.  She is advised to consider a CPAP machine or AutoPap machine which is a CPAP-like machine if she has obstructive sleep apnea.  She had recent blood work done.  I did not suggest any additional blood work from my end of things today.  I would like to proceed with a brain MRI without contrast and compare findings with her MRI from July 2019.  In addition, I suggested we proceed with a referral to neuropsychology for more in-depth memory evaluation through a licensed neuropsychologist with cognitive evaluation which is much more comprehensive and helps tease out strengths and possible weaknesses in her memory.  She is advised to consider treatment for anxiety and depression.  Untreated or suboptimally treated anxiety and depression can also result in cognitive dysfunction and subjective and objective memory loss. We talked about the importance of healthy lifestyle.  She is limited in her exercise capabilities due to leg pain and back pain.  Nevertheless, a stationary bike may be possible for her.  She has a treadmill at the house but does not use it.  She is furthermore advised to stay better hydrated with water.  She does have trouble drinking thin liquids.  She does like to drink quite a bit of coffee, drinks it and small sips.  She may be able to hydrate better with water also and smaller sips at a time.   I plan to see her back after testing.  I answered all her questions today and she was in agreement with the plan.  I did not suggest any new medications from my end of things today.  Star Age, MD, PhD

## 2020-07-27 ENCOUNTER — Encounter: Payer: Self-pay | Admitting: Family Medicine

## 2020-07-30 ENCOUNTER — Telehealth: Payer: Self-pay | Admitting: Neurology

## 2020-07-30 NOTE — Telephone Encounter (Signed)
medicare/cigna order sent to [facility]. They will obtain the auth for cigna order sent to [facility]

## 2020-07-31 ENCOUNTER — Encounter: Payer: Self-pay | Admitting: Psychology

## 2020-08-02 ENCOUNTER — Telehealth: Payer: Self-pay

## 2020-08-02 ENCOUNTER — Other Ambulatory Visit: Payer: Self-pay

## 2020-08-02 ENCOUNTER — Ambulatory Visit
Admission: RE | Admit: 2020-08-02 | Discharge: 2020-08-02 | Disposition: A | Discharge status: Home / Self Care | Payer: Medicare Other | Source: Ambulatory Visit | Attending: Neurology | Admitting: Neurology

## 2020-08-02 DIAGNOSIS — R413 Other amnesia: Secondary | ICD-10-CM

## 2020-08-02 DIAGNOSIS — R0683 Snoring: Secondary | ICD-10-CM

## 2020-08-02 DIAGNOSIS — R4789 Other speech disturbances: Secondary | ICD-10-CM

## 2020-08-02 DIAGNOSIS — G479 Sleep disorder, unspecified: Secondary | ICD-10-CM

## 2020-08-02 NOTE — Telephone Encounter (Signed)
LVM for pt to call me back to schedule sleep study  

## 2020-08-06 ENCOUNTER — Telehealth: Payer: Self-pay

## 2020-08-06 NOTE — Progress Notes (Signed)
Please call patient and advise her that her brain MRI without contrast from 08/04/2020 showed mostly stable and chronic findings, no acute findings and no specific explanation for memory loss or word finding difficulty.  Please call patient regarding the recent brain MRI: The brain scan showed a normal structure of the brain and mild volume loss which we call atrophy. There were changes in the deeper structures of the brain, which we call white matter changes or microvascular changes. These were reported as moderate in Her case. These are tiny white spots, that occur with time and are seen in a variety of conditions, including with normal aging, chronic hypertension, chronic headaches, especially migraine HAs, chronic diabetes, chronic hyperlipidemia. These are not strokes and no mass or lesion were seen which is reassuring. Again, there were no acute findings, such as a stroke, or mass or blood products. No further action is required on this test at this time, other than re-enforcing the importance of good blood pressure control, good cholesterol control, good blood sugar control, and weight management. Please remind patient to keep any upcoming appointments or tests and to call us with any interim questions, concerns, problems or updates. Thanks,  Star Age, MD, PhD

## 2020-08-06 NOTE — Telephone Encounter (Signed)
-----   Message from Star Age, MD sent at 08/06/2020  7:52 AM EST ----- Please call patient and advise her that her brain MRI without contrast from 08/04/2020 showed mostly stable and chronic findings, no acute findings and no specific explanation for memory loss or word finding difficulty.  Please call patient regarding the recent brain MRI: The brain scan showed a normal structure of the brain and mild volume loss which we call atrophy. There were changes in the deeper structures of the brain, which we call white matter changes or microvascular changes. These were reported as moderate in Her case. These are tiny white spots, that occur with time and are seen in a variety of conditions, including with normal aging, chronic hypertension, chronic headaches, especially migraine HAs, chronic diabetes, chronic hyperlipidemia. These are not strokes and no mass or lesion were seen which is reassuring. Again, there were no acute findings, such as a stroke, or mass or blood products. No further action is required on this test at this time, other than re-enforcing the importance of good blood pressure control, good cholesterol control, good blood sugar control, and weight management. Please remind patient to keep any upcoming appointments or tests and to call us with any interim questions, concerns, problems or updates. Thanks,  Star Age, MD, PhD

## 2020-08-06 NOTE — Telephone Encounter (Signed)
I called pt. I discussed pt's MRI results and recommendations. Pt will schedule her HST and her neuro psych appt and she also scheduled a follow up for 10/23/20 at 1:00pm with Dr. Rexene Alberts. Pt verbalized understanding of results. Pt had no questions at this time but was encouraged to call back if questions arise.

## 2020-08-13 ENCOUNTER — Telehealth: Payer: Self-pay | Admitting: *Deleted

## 2020-08-13 ENCOUNTER — Ambulatory Visit (HOSPITAL_BASED_OUTPATIENT_CLINIC_OR_DEPARTMENT_OTHER)
Admission: RE | Admit: 2020-08-13 | Discharge: 2020-08-13 | Disposition: A | Discharge status: Home / Self Care | Payer: Medicare Other | Source: Ambulatory Visit | Attending: Cardiology | Admitting: Cardiology

## 2020-08-13 ENCOUNTER — Other Ambulatory Visit: Payer: Self-pay

## 2020-08-13 DIAGNOSIS — R011 Cardiac murmur, unspecified: Secondary | ICD-10-CM | POA: Diagnosis not present

## 2020-08-13 LAB — ECHOCARDIOGRAM COMPLETE
Area-P 1/2: 2.82 cm2
S' Lateral: 2.22 cm

## 2020-08-13 NOTE — Telephone Encounter (Signed)
FYI Patient came into office and stated that she has a cough and some congestion.  She wanted someone to listen to her chest.  Advised her that we did have any openings at this time and offered appointment for tomorrow and she declined.  She stated that she will send a [employer].

## 2020-08-14 ENCOUNTER — Telehealth: Payer: Self-pay

## 2020-08-14 NOTE — Telephone Encounter (Signed)
Spoke with patient regarding results and recommendation.  Patient verbalizes understanding and is agreeable to plan of care. Advised patient to call back with any issues or concerns.  

## 2020-08-14 NOTE — Telephone Encounter (Signed)
-----   Message from Berniece Salines, DO sent at 08/14/2020  8:42 AM EST ----- Overall normal echo.

## 2020-08-20 ENCOUNTER — Telehealth: Payer: Self-pay | Admitting: Family Medicine

## 2020-08-20 NOTE — Telephone Encounter (Signed)
Patient states she's been dealing with a sinus infection for couple of weeks. Her symptoms sinus pressure, congestion, no fever, no cough, patient is vaccinated. Patient does not want a virtual appt.  Please advise

## 2020-08-20 NOTE — Telephone Encounter (Signed)
Sent mychart message to patient

## 2020-08-26 NOTE — Progress Notes (Signed)
[facility] [address], [place], [place] [address] [phone]  Date:  08/29/2020   Name:  Mary Buck   DOB:  1936-03-30   MRN:  [id number]  PCP:  Darreld Mclean, MD    Chief Complaint: Generalized Body Aches (Negative covid test, trouble walking)   History of Present Illness:  Mary Buck is a 84 y.o. very pleasant female patient who presents with the following:  Patient today with concern of body aches-last seen by myself in October;history of significant skin cancer problems, bladder cancer, spinal stenosis, hypothyroidism, hyperlipidemia, peripheral neuropathy, neurogenic claudication She plans to have a radiofrequency ablation just before Christmas  She is continue to do physical therapy for gait instability She saw neurology, Dr.Athar in November for concerns about memory:  She reports forgetfulness, short-term memory issues, difficulty with word finding and name recall.  Her memory scores are good today.  Nevertheless, further evaluation is feasible.  We had talked about pursuing sleep testing last time we met due to sleep difficulty and snoring reported.  I advised her that obstructive sleep apnea can tie in with cognitive issues.  She would be willing to pursue a home sleep test.  I explained that test procedure to her.  She is advised to consider a CPAP machine or AutoPap machine which is a CPAP-like machine if she has obstructive sleep apnea.  She had recent blood work done.  I did not suggest any additional blood work from my end of things today.  I would like to proceed with a brain MRI without contrast and compare findings with her MRI from July 2019.  In addition, I suggested we proceed with a referral to neuropsychology for more in-depth memory evaluation through a licensed neuropsychologist with cognitive evaluation which is much more comprehensive and helps tease out strengths and possible  weaknesses in her memory.  She is advised to consider treatment for anxiety and depression.  Untreated or suboptimally treated anxiety and depression can also result in cognitive dysfunction and subjective and objective memory loss.  MRI is back as follows:  IMPRESSION: This MRI of the brain without contrast shows the following: 1.   Mild stable generalized cortical atrophy 2.   T2/flair hyperintense foci predominantly in the deep white matter most consistent with moderate chronic microvascular ischemic change.  None of the foci appear to be acute. She also visited with cardiology, Dr. Harriet Masson in [place] concern of fatigue with exercise.  She had an echocardiogram which was basically normal  COVID-19 booster- done but she is not sure of the date   She notes some nasal congestion, and her "ears don't fel right, I have a cough"- she has note this for "weeks and weeks"  She took a covid test a week ago which was negative She did a sleep apnea test but results are still pending  She is having "pain injections" for her back per a provider whose name she cannot recall She did have a recent cystoscopy which was negative per her urologist   She notes that sometimes - really only when eating- she will seem like her "eyes roll back in my head and my fork will drop to the plate- then I wake back up and go back to normal" This may occur once a week- it has been going on for months It NEVER occurs except when she is eating Pt is still driving "driving is the only time I  feel totally ok"   Patient Active Problem List   Diagnosis Date Noted   IDA (iron deficiency anemia) 05/31/2020   Osteopenia 05/30/2019   Pedal edema 01/31/2018   Squamous cell carcinoma of skin of left lower limb, including hip 12/16/2017   Plantar fasciitis 07/07/2017   DDD (degenerative disc disease), lumbar 06/01/2017   Fibromyalgia 06/01/2017   Spinal stenosis at L4-L5 level 04/30/2017   Peripheral neuropathy  04/30/2017   Encounter for therapeutic drug monitoring 03/05/2017   Bladder cancer ([place]) 08/20/2015   Arthropathy of lumbar facet joint 07/27/2015   Spinal stenosis of cervical region 07/27/2015   Spondylolisthesis 07/27/2015   Hypothyroid 07/05/2015   Abdominal pain, acute, left upper quadrant 06/28/2015   Basal cell carcinoma, face 06/28/2015   Bilateral groin pain 06/28/2015   Chronic pain 06/28/2015   Fatigue 06/28/2015   Hyperlipidemia 06/28/2015   Bilateral hip pain 06/28/2015   Paresthesia of lower lip 06/28/2015   Pulmonary nodule 06/28/2015   Pustulosis palmaris et plantaris 06/28/2015   Ulcer of nose 06/28/2015   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   Mass of parotid gland 03/24/2014    Past Medical History:  Diagnosis Date   Allergy    Asthma    Cancer ([place])    Cataract    DDD (degenerative disc disease), lumbar 06/01/2017   Depression    GERD (gastroesophageal reflux disease)    Heart murmur    Hyperlipidemia    IDA (iron deficiency anemia) 05/31/2020   Peripheral neuropathy 04/30/2017   Thyroid disease     Past Surgical History:  Procedure Laterality Date   bladder cancer  2013   CARPAL TUNNEL RELEASE  2015   CATARACT EXTRACTION Bilateral 2012   CHOLECYSTECTOMY     COLONOSCOPY WITH ESOPHAGOGASTRODUODENOSCOPY (EGD)  02/22/2018   [facility] Left 2015   benign   SPINE SURGERY  6/31/21   vertaflex   TONSILLECTOMY AND ADENOIDECTOMY  1943    Social History   Tobacco Use   Smoking status: Former Smoker    Quit date: 2015    Years since quitting: 6.9   Smokeless tobacco: Never Used  Scientific laboratory technician Use: Never used  Substance Use Topics   Alcohol use: No   Drug use: No    Family History  Problem Relation Age of Onset   Macular degeneration Mother    Hyperlipidemia Father    Stroke Father    Cancer Brother        ? stomach   Prostate cancer Other         in brothers   Colon cancer Neg Hx     Allergies  Allergen Reactions   Contrast Media [Iodinated Diagnostic Agents] Anaphylaxis   Oxycodone Anaphylaxis    "my throat swells with any opioid"   Latex Other (See Comments)    Other reaction(s): blistering, redness    Mirtazapine Nausea Only   Acitretin Rash   Cephalexin Other (See Comments)    Unknown   Penicillins Other (See Comments)    Unknown   Sulfa Antibiotics Other (See Comments)    Unknown   Sulfur Other (See Comments)    unknown    Medication list has been reviewed and updated.  Current Outpatient Medications on File Prior to Visit  Medication Sig Dispense Refill   acitretin (SORIATANE) 10 MG capsule Take 10 mg by mouth 3 (three) times a week.     Cholecalciferol (VITAMIN D-3 PO) Take 1,000 Units  by mouth daily.     gabapentin (NEURONTIN) 300 MG capsule Take 1 capsule (300 mg total) by mouth 3 (three) times daily. 90 capsule 4   lactobacillus acidophilus (BACID) TABS tablet Take 1 tablet by mouth daily.      lansoprazole (PREVACID) 30 MG capsule TAKE ONE CAPSULE BY MOUTH TWICE DAILY BEFORE A MEAL. 180 capsule 3   levothyroxine (SYNTHROID) 88 MCG tablet TAKE 1 TABLET(88 MCG) BY MOUTH DAILY BEFORE BREAKFAST 90 tablet 1   misoprostol (CYTOTEC) 100 MCG tablet as directed. Takes two after breakfast and dinner     simvastatin (ZOCOR) 20 MG tablet Take 1 tablet (20 mg total) by mouth daily. 90 tablet 3   traMADol (ULTRAM) 50 MG tablet Take 50 mg by mouth 3 (three) times daily as needed. Takes 50 mg bid then 100 mg     No current facility-administered medications on file prior to visit.    Review of Systems:  As per HPI- otherwise negative.   Physical Examination: Vitals:   08/29/20 1015  BP: 122/80  Pulse: 65  Resp: 17  SpO2: 98%   Vitals:   08/29/20 1015  Weight: 158 lb 12.8 oz (72 kg)  Height: _0  (1.651 m)   Body mass index is 26.43 kg/m. Ideal Body Weight: Weight in (lb) to have  BMI = 25: 149.9  GEN: no acute distress.  Overweight, patient looks well HEENT: Atraumatic, Normocephalic.  Bilateral TM wnl, oropharynx normal.  PEERL,EOMI. mild inflammation of the nasal passages by Ears and Nose: No external deformity. CV: RRR, No M/G/R. No JVD. No thrill. No extra heart sounds. PULM: CTA B, no wheezes, crackles, rhonchi. No retractions. No resp. distress. No accessory muscle use. EXTR: No c/c/e PSYCH: Normally interactive. Conversant.    Assessment and Plan: Acute non-recurrent frontal sinusitis - Plan: clindamycin (CLEOCIN) 300 MG capsule  Acquired hypothyroidism - Plan: TSH  Changes in consciousness     Following up today for a couple of concerns Patient notes symptoms of possible sinusitis.  She is allergic to a couple of different antibiotics, doxycycline does not interact well with her psoriasis medication.  We decided treated with clindamycin for 10 days due to these restrictions.  I have asked her let me know if not feeling better in the next few days Check TSH today to follow-up on thyroid  Patient notes a phenomenon where she will seem to almost lose consciousness or fall asleep periodically, she reports this only occurs during meals.  Her partner has pointed this out to her.  I wonder if she may be starting to fall asleep, I asked patient to have her partner immediately try to get her attention and " wake her up" the next time she notes this behavior.  If she immediately regains alertness I suspect she is just starting to fall asleep.  However, if she does not respond to her partner this could represent some sort of seizure activity.  I have asked her to let me know what happens with this issue  This visit occurred during the SARS-CoV-2 public health emergency.  Safety protocols were in place, including screening questions prior to the visit, additional usage of staff PPE, and extensive cleaning of exam room while observing appropriate contact time as  indicated for disinfecting solutions.    Signed Lamar Blinks, MD

## 2020-08-27 ENCOUNTER — Ambulatory Visit (INDEPENDENT_AMBULATORY_CARE_PROVIDER_SITE_OTHER): Payer: Medicare Other | Admitting: Neurology

## 2020-08-27 DIAGNOSIS — R0683 Snoring: Secondary | ICD-10-CM

## 2020-08-27 DIAGNOSIS — G4733 Obstructive sleep apnea (adult) (pediatric): Secondary | ICD-10-CM | POA: Diagnosis not present

## 2020-08-27 DIAGNOSIS — R413 Other amnesia: Secondary | ICD-10-CM

## 2020-08-27 DIAGNOSIS — R4789 Other speech disturbances: Secondary | ICD-10-CM

## 2020-08-27 DIAGNOSIS — G479 Sleep disorder, unspecified: Secondary | ICD-10-CM

## 2020-08-29 ENCOUNTER — Ambulatory Visit (INDEPENDENT_AMBULATORY_CARE_PROVIDER_SITE_OTHER): Payer: Medicare Other | Admitting: Family Medicine

## 2020-08-29 ENCOUNTER — Encounter: Payer: Self-pay | Admitting: Family Medicine

## 2020-08-29 ENCOUNTER — Other Ambulatory Visit: Payer: Self-pay

## 2020-08-29 ENCOUNTER — Encounter: Payer: Self-pay | Admitting: Psychology

## 2020-08-29 ENCOUNTER — Encounter: Payer: Medicare Other | Attending: Psychology | Admitting: Psychology

## 2020-08-29 VITALS — BP 122/80 | HR 65 | Resp 17 | Ht 65.0 in | Wt 158.8 lb

## 2020-08-29 DIAGNOSIS — R413 Other amnesia: Secondary | ICD-10-CM | POA: Diagnosis not present

## 2020-08-29 DIAGNOSIS — E039 Hypothyroidism, unspecified: Secondary | ICD-10-CM

## 2020-08-29 DIAGNOSIS — R4189 Other symptoms and signs involving cognitive functions and awareness: Secondary | ICD-10-CM | POA: Diagnosis not present

## 2020-08-29 DIAGNOSIS — J0110 Acute frontal sinusitis, unspecified: Secondary | ICD-10-CM

## 2020-08-29 LAB — TSH: TSH: 1.74 u[IU]/mL (ref 0.35–4.50)

## 2020-08-29 MED ORDER — CLINDAMYCIN HCL 300 MG PO CAPS
300.0000 mg | ORAL_CAPSULE | Freq: Three times a day (TID) | ORAL | 0 refills | Status: DC
Start: 2020-08-29 — End: 2020-10-25

## 2020-08-29 NOTE — Patient Instructions (Signed)
Good to see you again today!  We will treat you for sinus infection with clindamycin, take 3 times a day for 10 days.  I wonder if the episodes you have noticed during eating a meal may be you starting to fall asleep.  Please ask [name] to try to wake you up immediately next time she notices this.  If you become alert right away, this is probably just excessive sleepiness.  If you are not able to respond to her it could be something more serious, please let me know

## 2020-08-29 NOTE — Progress Notes (Addendum)
NEUROBEHAVIORAL STATUS EXAM   Name: Mary Buck Date of Birth: 01/19/1936 Date of Interview: 08/29/2020  Reason for Referral:  AUDRIELLA BLAKELEY is a 84 y.o. female who is referred for neuropsychological evaluation by Dr. Rexene Alberts of [facility] due to recent concerns about memory loss and word retrieval. This patient is unaccompanied in the office during today's appointment  History of Presenting Problem:  Ms. Gerard is an 84 year old, right-handed, female, with complex medical history that includes lumbar spinal stenosis, history of bladder cancer, cervical spinal stenosis and degenerative spine disease, hypothyroidism, obesity,gait d/o,fibromyalgia, history of neuropathy, history of squamous cell cancer with status post radiation treatment to the left leg as well as treatment with acitretin.   She reports a several week or several month history of short-term memory issues particularly forgetting conversations or details of a conversation, word finding difficulty and difficulty recalling names. She has no obvious family history of dementia. Both parents lived to be in their 34s, father died at 27 and mom back to be 17. She is the middle child of 5 total kids, she has 2 older brothers and 2 younger brothers.   She reported current primary difficulties as memory loss, constant worry, depressed mood, low energy and fatigue, and constant pain from spinal stenosis.   Onset/Course: 1 year ago post radiation treatment to left leg.   Upon direct questioning, the patient reported:   Forgetting recent conversations/events: Yes, within the last year Repeating statements/questions: More in the last year Misplacing/losing items: Denied  Forgetting appointments or other obligations: Uses calender to keep track of appointments and future events. Denied problems using calender. Forgetting to take medications: More difficulty in the last year. Uses pill organizer for  assistance.   Difficulty concentrating: Change in last year  Starting but not finishing tasks: Change in the last year  Distracted easily: More in last year  Processing information more slowly: Denied   Word-finding difficulty: Denied  Word substitutions: Denied  Writing difficulty: Penmanship (used to be extremely ledgible  Spelling difficulty: Denied  Comprehension difficulty: Denied   Getting lost when driving: In certain areas. Reportedly never learned way around [place]. Denied getting lost in familiar areas.    Making wrong turns when driving: Rare Uncertain about directions when driving or passenger: Only in unfamiliar areas  Family neuro hx: Not to her knowledge  Any family hx dementia? Not to her knowledge   Current Functioning: Work: Retired  Complex ADLs Driving: Able to perform without difficulty  Medication management: Able to perform with some difficulty  Management of finances: Able to perform without difficulty  Appointments: Able to perform with use of calender and other compensatory strategies.  Cooking:  Able to perform without difficulty   Medical/Physical complaints:  Any hx of stroke/TIA, MI, LOC/TBI, Sz? Denied Hx falls? Denied  Balance, probs walking? Denied  Sleep: Insomnia? OSA? CPAP? REM sleep beh sx? Insomnia described as longstanding with onset around 19-39 years of age during a "difficult encounter". She jumped over a snake while retrieving a softball during school and believed she might have been bitten despite absence of bite mark or physiological symptoms. She reportedly refused to sleep that night due to fear of death. Recalled onset of OCD like behaviors (e.g., hand washing and checking) and around the same time; reportedly lasted 1-2 years   Visual illusions/hallucinations? Denied  Appetite/Nutrition/Weight changes: Appetite is "normal". No weight changes.  Current mood: Dysphoric  Behavioral disturbance/Personality change: Denied   Suicidal Ideation/Intention: Denied  Psychiatric History: History of depression, anxiety, other MH disorder: Longstanding history of generalized anxiety and chronic low grade depression.  History of MH treatment: Individual and group therapy  History of SI: Denied  History of substance dependence/treatment: Denied   Family Psychiatric History:   Social History: Born/Raised: [place]  Education: 52; A.A. in accounting and B.A. 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 Marital history: Single, never married. Has been with female partner for many years.    Children: 0 Alcohol: Denied Tobacco: Denied  SA: Denied  Medical History: Past Medical History:  Diagnosis Date  . Allergy   . Asthma   . Cancer ([place])   . Cataract   . DDD (degenerative disc disease), lumbar 06/01/2017  . Depression   . GERD (gastroesophageal reflux disease)   . Heart murmur   . Hyperlipidemia   . IDA (iron deficiency anemia) 05/31/2020  . Peripheral neuropathy 04/30/2017  . Thyroid disease    Imaging:   MRI of the brain (08/02/20) without contrast shows the following: 1. Mild stable generalized cortical atrophy 2. T2/flair hyperintense foci predominantly in the deep white matter most consistent with moderate chronic microvascular ischemic change. None of the foci appear to be acute.  She had an echocardiogram that was reportedly normal  Current Medications:  Outpatient Encounter Medications as of 08/29/2020  Medication Sig  . acitretin (SORIATANE) 10 MG capsule Take 10 mg by mouth 3 (three) times a week.  . Cholecalciferol (VITAMIN D-3 PO) Take 1,000 Units by mouth daily.  . clindamycin (CLEOCIN) 300 MG capsule Take 1 capsule (300 mg total) by mouth 3 (three) times daily.  Marland Kitchen gabapentin (NEURONTIN) 300 MG capsule Take 1 capsule (300 mg total)  by mouth 3 (three) times daily.  Marland Kitchen lactobacillus acidophilus (BACID) TABS tablet Take 1 tablet by mouth daily.   . lansoprazole (PREVACID) 30 MG capsule TAKE ONE CAPSULE BY MOUTH TWICE DAILY BEFORE A MEAL.  Marland Kitchen levothyroxine (SYNTHROID) 88 MCG tablet TAKE 1 TABLET(88 MCG) BY MOUTH DAILY BEFORE BREAKFAST  . misoprostol (CYTOTEC) 100 MCG tablet as directed. Takes two after breakfast and dinner  . simvastatin (ZOCOR) 20 MG tablet Take 1 tablet (20 mg total) by mouth daily.  . traMADol (ULTRAM) 50 MG tablet Take 50 mg by mouth 3 (three) times daily as needed. Takes 50 mg bid then 100 mg   No facility-administered encounter medications on file as of 08/29/2020.   Behavioral Observations:   Appearance: Neatly, casually and appropriately dressed and groomed Gait: Ambulated independently, no gross abnormalities observed Speech: Fluent; normal rate, rhythm and volume. Mild word finding difficulty. Thought process: Linear, goal directed Affect: Full, anxious Interpersonal: Pleasant, appropriate  60 minutes spent face-to-face with patient completing neurobehavioral status exam. 60 minutes spent integrating medical records/clinical data and completing this report. [id number] unit; [id number].  TESTING: There is medical necessity to proceed with neuropsychological assessment as the results will be used to aid in differential diagnosis and clinical decision-making and to inform specific treatment recommendations. Per the patient, medical records reviewed, there has been a change in cognitive functioning and a reasonable suspicion of dementia.  Clinical Decision Making: In considering the patient's current level of functioning, level of presumed impairment, nature of symptoms, emotional and behavioral responses during the interview, level of literacy, and observed level of motivation, a battery of tests was selected for patient to complete  during a separate testing appointment.   PLAN: The patient will return  to complete the above referenced full battery of neuropsychological testing with this provider. Education regarding testing procedures was provided to the patient. Subsequently, the patient will see this provider for a follow-up session at which time her test performances and my impressions and treatment recommendations will be reviewed in detail.   Evaluation ongoing; full report to follow.

## 2020-08-30 DIAGNOSIS — C801 Malignant (primary) neoplasm, unspecified: Secondary | ICD-10-CM | POA: Insufficient documentation

## 2020-08-30 DIAGNOSIS — R011 Cardiac murmur, unspecified: Secondary | ICD-10-CM | POA: Insufficient documentation

## 2020-08-30 DIAGNOSIS — K219 Gastro-esophageal reflux disease without esophagitis: Secondary | ICD-10-CM | POA: Insufficient documentation

## 2020-08-30 DIAGNOSIS — F32A Depression, unspecified: Secondary | ICD-10-CM | POA: Insufficient documentation

## 2020-08-30 DIAGNOSIS — T7840XA Allergy, unspecified, initial encounter: Secondary | ICD-10-CM | POA: Insufficient documentation

## 2020-08-30 DIAGNOSIS — E079 Disorder of thyroid, unspecified: Secondary | ICD-10-CM | POA: Insufficient documentation

## 2020-08-30 DIAGNOSIS — J45909 Unspecified asthma, uncomplicated: Secondary | ICD-10-CM | POA: Insufficient documentation

## 2020-08-30 DIAGNOSIS — H269 Unspecified cataract: Secondary | ICD-10-CM | POA: Insufficient documentation

## 2020-09-03 ENCOUNTER — Encounter: Payer: Self-pay | Admitting: Cardiology

## 2020-09-03 ENCOUNTER — Other Ambulatory Visit: Payer: Self-pay

## 2020-09-03 ENCOUNTER — Ambulatory Visit (INDEPENDENT_AMBULATORY_CARE_PROVIDER_SITE_OTHER): Payer: Medicare Other | Admitting: Cardiology

## 2020-09-03 VITALS — BP 120/82 | HR 70 | Ht 65.0 in | Wt 165.0 lb

## 2020-09-03 DIAGNOSIS — E782 Mixed hyperlipidemia: Secondary | ICD-10-CM | POA: Diagnosis not present

## 2020-09-03 NOTE — Progress Notes (Signed)
Cardiology Office Note:    Date:  09/03/2020   ID:  KHADEJAH SON, DOB January 07, 1936, MRN [id number]  PCP:  Darreld Mclean, MD  Cardiologist:  Berniece Salines, DO  Electrophysiologist:  None   Referring MD: Darreld Mclean, MD   I am doing fine  History of Present Illness:    Mary Buck is a 84 y.o. female with a hx of skin cancer, bladder cancer, hypothyroidism, peripheral neuropathy, neurogenic claudication and hyperlipidemia is here today for follow-up visit. When I last saw the patient in November 2021 at that time she did have a murmur so I recommended she undergo a echocardiogram.  In the interim she was able to get this testing done. We will send the patient information about her testing.  Today she tells me that she still is patient significant upper type pain and has to wait to get her injection due to the fact that she was recently on antibiotics.  Past Medical History:  Diagnosis Date  . Allergy   . Asthma   . Cancer ([place])   . Cataract   . DDD (degenerative disc disease), lumbar 06/01/2017  . Depression   . GERD (gastroesophageal reflux disease)   . Heart murmur   . Hyperlipidemia   . IDA (iron deficiency anemia) 05/31/2020  . Peripheral neuropathy 04/30/2017  . Thyroid disease     Past Surgical History:  Procedure Laterality Date  . bladder cancer  2013  . CARPAL TUNNEL RELEASE  2015  . CATARACT EXTRACTION Bilateral 2012  . CHOLECYSTECTOMY    . COLONOSCOPY WITH ESOPHAGOGASTRODUODENOSCOPY (EGD)  02/22/2018   [facility]  . SALIVARY GLAND SURGERY Left 2015   benign  . SPINE SURGERY  6/31/21   vertaflex  . TONSILLECTOMY AND ADENOIDECTOMY  1943    Current Medications: Current Meds  Medication Sig  . acitretin (SORIATANE) 10 MG capsule Take 10 mg by mouth 3 (three) times a week.  . Cholecalciferol (VITAMIN D-3 PO) Take 1,000 Units by mouth daily.  . clindamycin (CLEOCIN) 300 MG capsule Take 1 capsule (300 mg total) by  mouth 3 (three) times daily.  Marland Kitchen gabapentin (NEURONTIN) 300 MG capsule Take 1 capsule (300 mg total) by mouth 3 (three) times daily.  Marland Kitchen lactobacillus acidophilus (BACID) TABS tablet Take 1 tablet by mouth daily.   . lansoprazole (PREVACID) 30 MG capsule TAKE ONE CAPSULE BY MOUTH TWICE DAILY BEFORE A MEAL.  Marland Kitchen levothyroxine (SYNTHROID) 88 MCG tablet TAKE 1 TABLET(88 MCG) BY MOUTH DAILY BEFORE BREAKFAST  . misoprostol (CYTOTEC) 100 MCG tablet as directed. Takes two after breakfast and dinner  . simvastatin (ZOCOR) 20 MG tablet Take 1 tablet (20 mg total) by mouth daily.  . traMADol (ULTRAM) 50 MG tablet Take 50 mg by mouth 3 (three) times daily as needed. Takes 50 mg bid then 100 mg     Allergies:   Contrast media [iodinated diagnostic agents], Oxycodone, Latex, Mirtazapine, Acitretin, Cephalexin, Penicillins, Sulfa antibiotics, and Sulfur   Social History   Socioeconomic History  . Marital status: Significant Other    Spouse name: Not on file  . Number of children: 0  . Years of education: Not on file  . Highest education level: Not on file  Occupational History  . Occupation: retired  Tobacco Use  . Smoking status: Former Smoker    Quit date: 2015    Years since quitting: 6.9  . Smokeless tobacco: Never Used  Vaping Use  . Vaping Use: Never used  Substance and Sexual Activity  . Alcohol use: No  . Drug use: No  . Sexual activity: Not on file  Other Topics Concern  . Not on file  Social History Narrative  . Not on file   Social Determinants of Health   Financial Resource Strain: Low Risk   . Difficulty of Paying Living Expenses: Not hard at all  Food Insecurity: No Food Insecurity  . Worried About Charity fundraiser in the Last Year: Never true  . Ran Out of Food in the Last Year: Never true  Transportation Needs: No Transportation Needs  . Lack of Transportation (Medical): No  . Lack of Transportation (Non-Medical): No  Physical Activity: Not on file  Stress: Not on  file  Social Connections: Not on file     Family History: The patient's family history includes Cancer in her brother; Hyperlipidemia in her father; Macular degeneration in her mother; Prostate cancer in an other family member; Stroke in her father. There is no history of Colon cancer.  ROS:   Review of Systems  Constitution: Negative for decreased appetite, fever and weight gain.  HENT: Negative for congestion, ear discharge, hoarse voice and sore throat.   Eyes: Negative for discharge, redness, vision loss in right eye and visual halos.  Cardiovascular: Negative for chest pain, dyspnea on exertion, leg swelling, orthopnea and palpitations.  Respiratory: Negative for cough, hemoptysis, shortness of breath and snoring.   Endocrine: Negative for heat intolerance and polyphagia.  Hematologic/Lymphatic: Negative for bleeding problem. Does not bruise/bleed easily.  Skin: Negative for flushing, nail changes, rash and suspicious lesions.  Musculoskeletal: Negative for arthritis, joint pain, muscle cramps, myalgias, neck pain and stiffness.  Gastrointestinal: Negative for abdominal pain, bowel incontinence, diarrhea and excessive appetite.  Genitourinary: Negative for decreased libido, genital sores and incomplete emptying.  Neurological: Negative for brief paralysis, focal weakness, headaches and loss of balance.  Psychiatric/Behavioral: Negative for altered mental status, depression and suicidal ideas.  Allergic/Immunologic: Negative for HIV exposure and persistent infections.    EKGs/Labs/Other Studies Reviewed:    The following studies were reviewed today:   EKG: None today   1. Left ventricular ejection fraction, by estimation, is 60 to 65%. The  left ventricle has normal function. The left ventricle has no regional  wall motion abnormalities. There is mild concentric left ventricular  hypertrophy. Indeterminate diastolic  filling due to E-A fusion.  2. Right ventricular  systolic function is normal. The right ventricular  size is normal. There is normal pulmonary artery systolic pressure.  3. The mitral valve is normal in structure. No evidence of mitral valve  regurgitation. No evidence of mitral stenosis.  4. The aortic valve is normal in structure. Aortic valve regurgitation is  not visualized. No aortic stenosis is present.  5. The inferior vena cava is normal in size with greater than 50%  respiratory variability, suggesting right atrial pressure of 3 mmHg.   Comparison(s): No prior Echocardiogram.   FINDINGS  Left Ventricle: Left ventricular ejection fraction, by estimation, is 60  to 65%. The left ventricle has normal function. The left ventricle has no  regional wall motion abnormalities. The left ventricular internal cavity  size was normal in size. There is  mild concentric left ventricular hypertrophy. Indeterminate diastolic  filling due to E-A fusion.   Right Ventricle: The right ventricular size is normal. No increase in  right ventricular wall thickness. Right ventricular systolic function is  normal. There is normal pulmonary artery systolic pressure. The tricuspid  regurgitant velocity is 2.69 m/s, and  with an assumed right atrial pressure of 3 mmHg, the estimated right  ventricular systolic pressure is 12.2 mmHg.   Left Atrium: Left atrial size was normal in size.   Right Atrium: Right atrial size was normal in size.   Pericardium: There is no evidence of pericardial effusion. Presence of  pericardial fat pad.   Mitral Valve: The mitral valve is normal in structure. No evidence of  mitral valve regurgitation. No evidence of mitral valve stenosis.   Tricuspid Valve: The tricuspid valve is normal in structure. Tricuspid  valve regurgitation is not demonstrated. No evidence of tricuspid  stenosis.   Aortic Valve: The aortic valve is normal in structure. Aortic valve  regurgitation is not visualized. No aortic stenosis is  present.   Pulmonic Valve: The pulmonic valve was normal in structure. Pulmonic valve  regurgitation is not visualized. No evidence of pulmonic stenosis.   Aorta: The aortic root is normal in size and structure.   Venous: The inferior vena cava is normal in size with greater than 50%  respiratory variability, suggesting right atrial pressure of 3 mmHg.   IAS/Shunts: No atrial level shunt detected by color flow Doppler  Recent Labs: 12/22/2019: Pro B Natriuretic peptide (BNP) 94.0 05/30/2020: ALT 10; BUN 15; Creatinine 0.92; Potassium 4.1; Sodium 143 07/25/2020: Hemoglobin 12.5; Platelet Count 214 08/29/2020: TSH 1.74  Recent Lipid Panel    Component Value Date/Time   CHOL 175 05/02/2019 1542   TRIG 202.0 (H) 05/02/2019 1542   HDL 51.50 05/02/2019 1542   CHOLHDL 3 05/02/2019 1542   VLDL 40.4 (H) 05/02/2019 1542   LDLCALC 76 08/03/2017 1310   LDLDIRECT 95.0 05/02/2019 1542    Physical Exam:    VS:  BP 120/82   Pulse 70   Ht 5\' 5"  (1.651 m)   Wt 165 lb (74.8 kg)   SpO2 96%   BMI 27.46 kg/m     Wt Readings from Last 3 Encounters:  09/03/20 165 lb (74.8 kg)  08/29/20 158 lb 12.8 oz (72 kg)  07/26/20 164 lb (74.4 kg)     GEN: Well nourished, well developed in no acute distress HEENT: Normal NECK: No JVD; No carotid bruits LYMPHATICS: No lymphadenopathy CARDIAC: S1S2 noted,RRR, 2/6 systolic murmurs, rubs, gallops RESPIRATORY:  Clear to auscultation without rales, wheezing or rhonchi  ABDOMEN: Soft, non-tender, non-distended, +bowel sounds, no guarding. EXTREMITIES: No edema, No cyanosis, no clubbing MUSCULOSKELETAL:  No deformity  SKIN: Warm and dry NEUROLOGIC:  Alert and oriented x 3, non-focal PSYCHIATRIC:  Normal affect, good insight  ASSESSMENT:    1. Mixed hyperlipidemia    PLAN:    She appears to be doing well from a cardiovascular standpoint.  We discussed her testing results on her questions has been answered. She is experiencing her thigh claudication  she has been following with other specialist.  She is hopeful that in January she is able to get her injection.  The patient is in agreement with the above plan. The patient left the office in stable condition.  The patient will follow up in 12 months.    Medication Adjustments/Labs and Tests Ordered: Current medicines are reviewed at length with the patient today.  Concerns regarding medicines are outlined above.  No orders of the defined types were placed in this encounter.  No orders of the defined types were placed in this encounter.   Patient Instructions  Medication Instructions:  No medication changes. *If you need a refill on  your cardiac medications before your next appointment, please call your pharmacy*   Lab Work: None ordered If you have labs (blood work) drawn today and your tests are completely normal, you will receive your results only by: Marland Kitchen MyChart Message (if you have MyChart) OR . A paper copy in the mail If you have any lab test that is abnormal or we need to change your treatment, we will call you to review the results.   Testing/Procedures: None ordered   Follow-Up: At [facility], you and your health needs are our priority.  As part of our continuing mission to provide you with exceptional heart care, we have created designated Provider Care Teams.  These Care Teams include your primary Cardiologist (physician) and Advanced Practice Providers (APPs -  Physician Assistants and Nurse Practitioners) who all work together to provide you with the care you need, when you need it.  We recommend signing up for the patient portal called "MyChart".  Sign up information is provided on this After Visit Summary.  MyChart is used to connect with patients for Virtual Visits (Telemedicine).  Patients are able to view lab/test results, encounter notes, upcoming appointments, etc.  Non-urgent messages can be sent to your provider as well.   To learn more about what you can  do with MyChart, go to [email or web address].    Your next appointment:   12 month(s)  The format for your next appointment:   In Person  Provider:   Berniece Salines, DO   Other Instructions NA     Adopting a Healthy Lifestyle.  Know what a healthy weight is for you (roughly BMI <25) and aim to maintain this   Aim for 7+ servings of fruits and vegetables daily   65-80+ fluid ounces of water or unsweet tea for healthy kidneys   Limit to max 1 drink of alcohol per day; avoid smoking/tobacco   Limit animal fats in diet for cholesterol and heart health - choose grass fed whenever available   Avoid highly processed foods, and foods high in saturated/trans fats   Aim for low stress - take time to unwind and care for your mental health   Aim for 150 min of moderate intensity exercise weekly for heart health, and weights twice weekly for bone health   Aim for 7-9 hours of sleep daily   When it comes to diets, agreement about the perfect plan isnt easy to find, even among the experts. Experts at the [employer] developed an idea known as the Healthy Eating Plate. Just imagine a plate divided into logical, healthy portions.   The emphasis is on diet quality:   Load up on vegetables and fruits - one-half of your plate: Aim for color and variety, and remember that potatoes dont count.   Go for whole grains - one-quarter of your plate: Whole wheat, barley, wheat berries, quinoa, oats, brown rice, and foods made with them. If you want pasta, go with whole wheat pasta.   Protein power - one-quarter of your plate: Fish, chicken, beans, and nuts are all healthy, versatile protein sources. Limit red meat.   The diet, however, does go beyond the plate, offering a few other suggestions.   Use healthy plant oils, such as olive, canola, soy, corn, sunflower and peanut. Check the labels, and avoid partially hydrogenated oil, which have unhealthy trans fats.   If  youre thirsty, drink water. Coffee and tea are good in moderation, but skip sugary drinks and limit milk  and dairy products to one or two daily servings.   The type of carbohydrate in the diet is more important than the amount. Some sources of carbohydrates, such as vegetables, fruits, whole grains, and beans-are healthier than others.   Finally, stay active  Signed, Berniece Salines, DO  09/03/2020 2:58 PM    Bud

## 2020-09-03 NOTE — Patient Instructions (Signed)
Medication Instructions:  °No medication changes. °*If you need a refill on your cardiac medications before your next appointment, please call your pharmacy* ° ° °Lab Work: °None ordered °If you have labs (blood work) drawn today and your tests are completely normal, you will receive your results only by: °• MyChart Message (if you have MyChart) OR °• A paper copy in the mail °If you have any lab test that is abnormal or we need to change your treatment, we will call you to review the results. ° ° °Testing/Procedures: °None ordered ° ° °Follow-Up: °At [facility], you and your health needs are our priority.  As part of our continuing mission to provide you with exceptional heart care, we have created designated Provider Care Teams.  These Care Teams include your primary Cardiologist (physician) and Advanced Practice Providers (APPs -  Physician Assistants and Nurse Practitioners) who all work together to provide you with the care you need, when you need it. ° °We recommend signing up for the patient portal called "MyChart".  Sign up information is provided on this After Visit Summary.  MyChart is used to connect with patients for Virtual Visits (Telemedicine).  Patients are able to view lab/test results, encounter notes, upcoming appointments, etc.  Non-urgent messages can be sent to your provider as well.   °To learn more about what you can do with MyChart, go to [email or web address].   ° °Your next appointment:   °12 month(s) ° °The format for your next appointment:   °In Person ° °Provider:   °Kardie Tobb, DO ° ° °Other Instructions °NA ° °

## 2020-09-09 ENCOUNTER — Encounter: Payer: Self-pay | Admitting: Psychology

## 2020-09-09 NOTE — Procedures (Signed)
   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   Epworth sleepiness score: 12/24.  BMI: 27.2 kg/m  FINDINGS:   Total Record Time (hours, min): 8 H 54 min  Total Sleep Time (hours, min):  8 H 1 min   Percent REM (%):    34.62 %   Calculated pAHI (per hour):  26.4      REM pAHI:    37.6     NREM pAHI: 20.6  Supine AHI: 30.2   Oxygen Saturation (%) Mean: 94  Minimum oxygen saturation (%):        77   O2 Saturation Range (%): 77-99  O2Saturation (minutes) <=88%: 4.6 min   Pulse Mean (bpm):    60  Pulse Range (43-83)   IMPRESSION: OSA (obstructive sleep apnea)  RECOMMENDATION:  This home sleep test demonstrates moderate obstructive sleep apnea with a total AHI of 26.4/hour and O2 nadir of 77%. Treatment with positive airway pressure is recommended. The patient will be advised to proceed with an autoPAP titration/trial at home for now. A full night titration study may be considered to optimize treatment settings, if needed down the road. Please note that untreated obstructive sleep apnea may carry additional perioperative morbidity. Patients with significant obstructive sleep apnea should receive perioperative PAP therapy and the surgeons and particularly the anesthesiologist should be informed of the diagnosis and the severity of the sleep disordered breathing. The patient should be cautioned not to drive,  work at heights, or operate dangerous or heavy equipment when tired or sleepy. Review and reiteration of good sleep hygiene measures should be pursued with any patient. Other causes of the patient's symptoms, including circadian rhythm disturbances, an underlying mood disorder, medication effect and/or an underlying medical problem cannot be ruled out based on this test. Clinical correlation is recommended. The patient and her referring provider will be notified of the test results. The patient will be seen in follow up in sleep clinic at [facility].  I certify that I have reviewed the raw data recording prior to the issuance of this report in accordance with the standards of the American Academy of Sleep Medicine (AASM).  INTERPRETING PHYSICIAN:    Star Age, MD, PhD  Board Certified in Neurology and Sleep Medicine [facility] [address], [place], [place] [address] [phone]

## 2020-09-09 NOTE — Addendum Note (Signed)
Addended by: Star Age on: 09/09/2020 08:03 PM   Modules accepted: Orders

## 2020-09-09 NOTE — Progress Notes (Signed)
Patient referred by Dr. Lorelei Pont, last seen for memory eval on 1111/21, HST on 08/28/20.    Please call and notify the patient that the recent home sleep test showed obstructive sleep apnea in the moderate range. I recommend treatment in the form of autoPAP, which means, that we don't have to bring her in for a sleep study with CPAP, but will let start using a so called autoPAP machine at home, through a DME company (of her choice, or as per insurance requirement). The DME representative will educate her on how to use the machine, how to put the mask on, etc. I have placed an order in the chart. Please send referral, talk to patient, send report to referring MD. We will need a FU in sleep clinic for 10 weeks post-PAP set up, please arrange that with me or one of our NPs. Thanks,   Star Age, MD, PhD [facility])

## 2020-09-10 ENCOUNTER — Telehealth: Payer: Self-pay | Admitting: Neurology

## 2020-09-10 NOTE — Telephone Encounter (Signed)
I called pt. I advised pt that Dr. Frances Furbish reviewed their sleep study results and found that pt has moderate sleep apnea. Dr. Frances Furbish recommends that pt starts auto CPAP. I reviewed PAP compliance expectations with the pt. Pt is agreeable to meeting with the DME company and potentially starting a CPAP. I advised pt that an order will be sent to a DME, [facility] and [facility] will call the pt within about one week after they file with the pt's insurance. [facility]) will show the pt how to use the machine, fit for masks, and troubleshoot the CPAP if needed. A follow up appt will need to be made for insurance purposes with Dr. Frances Furbish or the NP. Pt verbalized understanding to call the office to schedule the appointment 31-90 days from the date she picks up the machine. A letter with all of this information in it will be mailed to the pt as a reminder. I verified with the pt that the address we have on file is correct. Pt verbalized understanding of results. Pt had no questions at this time but was encouraged to call back if questions arise. I have sent the order to [facility]) and have received confirmation that they have received the order.

## 2020-09-10 NOTE — Telephone Encounter (Signed)
-----   Message from Huston Foley, MD sent at 09/09/2020  8:03 PM EST ----- Patient referred by Dr. Patsy Lager, last seen for memory eval on 1111/21, HST on 08/28/20.    Please call and notify the patient that the recent home sleep test showed obstructive sleep apnea in the moderate range. I recommend treatment in the form of autoPAP, which means, that we don't have to bring her in for a sleep study with CPAP, but will let start using a so called autoPAP machine at home, through a DME company (of her choice, or as per insurance requirement). The DME representative will educate her on how to use the machine, how to put the mask on, etc. I have placed an order in the chart. Please send referral, talk to patient, send report to referring MD. We will need a FU in sleep clinic for 10 weeks post-PAP set up, please arrange that with me or one of our NPs. Thanks,   Huston Foley, MD, PhD [facility])

## 2020-09-12 DIAGNOSIS — Z79899 Other long term (current) drug therapy: Secondary | ICD-10-CM | POA: Diagnosis not present

## 2020-09-17 ENCOUNTER — Encounter: Payer: Self-pay | Admitting: Family Medicine

## 2020-09-17 DIAGNOSIS — R2681 Unsteadiness on feet: Secondary | ICD-10-CM

## 2020-09-17 DIAGNOSIS — M48061 Spinal stenosis, lumbar region without neurogenic claudication: Secondary | ICD-10-CM

## 2020-09-20 ENCOUNTER — Encounter: Payer: Self-pay | Admitting: Family Medicine

## 2020-09-24 DIAGNOSIS — M48062 Spinal stenosis, lumbar region with neurogenic claudication: Secondary | ICD-10-CM | POA: Diagnosis not present

## 2020-09-24 DIAGNOSIS — Z5181 Encounter for therapeutic drug level monitoring: Secondary | ICD-10-CM | POA: Diagnosis not present

## 2020-09-24 DIAGNOSIS — M47816 Spondylosis without myelopathy or radiculopathy, lumbar region: Secondary | ICD-10-CM | POA: Diagnosis not present

## 2020-09-24 DIAGNOSIS — M5136 Other intervertebral disc degeneration, lumbar region: Secondary | ICD-10-CM | POA: Diagnosis not present

## 2020-09-24 DIAGNOSIS — G894 Chronic pain syndrome: Secondary | ICD-10-CM | POA: Diagnosis not present

## 2020-09-25 DIAGNOSIS — M47816 Spondylosis without myelopathy or radiculopathy, lumbar region: Secondary | ICD-10-CM | POA: Diagnosis not present

## 2020-09-25 DIAGNOSIS — Z5181 Encounter for therapeutic drug level monitoring: Secondary | ICD-10-CM | POA: Diagnosis not present

## 2020-10-04 ENCOUNTER — Ambulatory Visit: Payer: Medicare Other | Admitting: Psychology

## 2020-10-05 ENCOUNTER — Encounter: Payer: Medicare Other | Attending: Psychology | Admitting: Psychology

## 2020-10-05 ENCOUNTER — Other Ambulatory Visit: Payer: Self-pay

## 2020-10-05 ENCOUNTER — Encounter: Payer: Self-pay | Admitting: Psychology

## 2020-10-05 DIAGNOSIS — F341 Dysthymic disorder: Secondary | ICD-10-CM | POA: Insufficient documentation

## 2020-10-05 DIAGNOSIS — G8929 Other chronic pain: Secondary | ICD-10-CM | POA: Diagnosis not present

## 2020-10-05 DIAGNOSIS — F411 Generalized anxiety disorder: Secondary | ICD-10-CM | POA: Diagnosis not present

## 2020-10-05 DIAGNOSIS — R413 Other amnesia: Secondary | ICD-10-CM | POA: Insufficient documentation

## 2020-10-05 NOTE — Progress Notes (Addendum)
NEUROPSYCHOLOGICAL EVALUATION   Name:    Mary Buck  Date of Birth:   Oct 12, 1935 Date of Interview:  08/29/20 Date of Testing:  10/05/20   Date of Feedback:  10/10/20       Background Information:  Reason for Referral:  Mary Buck is a 85 y.o. female referred by Dr. Rexene Alberts to assess her current level of cognitive functioning and assist in differential diagnosis. The current evaluation consisted of a review of available medical records, an interview with the patient, and the completion of a neuropsychological testing battery. Informed consent was obtained.  History of Presenting Problem:  Mary Buck is an 85 year old, right-handed, female, with complex medical history that includes lumbar spinal stenosis, history of bladder cancer, cervical spinal stenosis and degenerative spine disease, hypothyroidism, obesity,gait d/o,fibromyalgia, history of neuropathy, history of squamous cell cancer with status post radiation treatment to the left leg as well as treatment with acitretin.   She reports a several week or several month history of short-term memory issues particularly forgetting conversations or details of a conversation, word finding difficulty and difficulty recalling names. She has no obvious family history of dementia. Both parents lived to be in their 52s, father died at 21 and mom back to be 80. She is the middle child of 5 total kids, she has 2 older brothers and 2 younger brothers.   Onset/Course: 1 year ago post radiation treatment to left leg.   Medical History:  Past Medical History:  Diagnosis Date  . Allergy   . Asthma   . Cancer ([place])   . Cataract   . DDD (degenerative disc disease), lumbar 06/01/2017  . Depression   . GERD (gastroesophageal reflux disease)   . Heart murmur   . Hyperlipidemia   . IDA (iron deficiency anemia) 05/31/2020  . Peripheral neuropathy 04/30/2017  . Thyroid disease    MRI of Brain on 08/02/20 showed the following:    IMPRESSION: This MRI of the brain without contrast shows the following: 1.   Mild stable generalized cortical atrophy 2.   T2/flair hyperintense foci predominantly in the deep white matter most consistent with moderate chronic microvascular ischemic change.  None of the foci appear to be acute.  Current medications:  Outpatient Encounter Medications as of 10/09/2020  Medication Sig  . acitretin (SORIATANE) 10 MG capsule Take 10 mg by mouth 3 (three) times a week.  . Cholecalciferol (VITAMIN D-3 PO) Take 1,000 Units by mouth daily.  . clindamycin (CLEOCIN) 300 MG capsule Take 1 capsule (300 mg total) by mouth 3 (three) times daily.  Marland Kitchen gabapentin (NEURONTIN) 300 MG capsule Take 1 capsule (300 mg total) by mouth 3 (three) times daily.  Marland Kitchen lactobacillus acidophilus (BACID) TABS tablet Take 1 tablet by mouth daily.   . lansoprazole (PREVACID) 30 MG capsule TAKE ONE CAPSULE BY MOUTH TWICE DAILY BEFORE A MEAL.  Marland Kitchen levothyroxine (SYNTHROID) 88 MCG tablet TAKE 1 TABLET(88 MCG) BY MOUTH DAILY BEFORE BREAKFAST  . misoprostol (CYTOTEC) 100 MCG tablet as directed. Takes two after breakfast and dinner  . simvastatin (ZOCOR) 20 MG tablet Take 1 tablet (20 mg total) by mouth daily.  . traMADol (ULTRAM) 50 MG tablet Take 50 mg by mouth 3 (three) times daily as needed. Takes 50 mg bid then 100 mg   No facility-administered encounter medications on file as of 10/09/2020.   Current Examination:  Mary Buck completed 240 minutes of neuropsychological testing with this provider.    Behavioral Observations:  Appearance:  casually and appropriately dressed with normal hygiene. Gait: Able to walk independently without assistance but brought/uses cane for additional support; mild unsteadiness. Did not use cane appropriately during brief observation.  Speech: Clear, normal rate, normal tone & volume. Mild WFD.  Thought process:  Linear, organized, and logical. Mild impulsivity.  Mood/Affect: Depressed,  somewhat blunted but laughed appropria.   Interpersonal: Guarded (slow to warm) and avoidant but polite and caring.  Orientation: Oriented to all spheres. Accurately named the current President and his predecessor.  Effort/Motivation: Good   She did not appear to have difficulty seeing, hearing, or understanding test instructions. The patient did not appear overtly distressed by the testing session, per behavioral observation or via self-report. She exhibited adequate distress tolerance on questions she did not know or tasks that were more difficult. She did however make frequent negative remarks about her abilities and other self-deprecating statements. She was self-critical and apologized after perceived errors.  She described feeling dizzy after 1 1/2 hours and we decided to take a 15-minute break. She described feeling better after eating a snack and drinking more water thus testing resumed. She completed all assigned tasks and gave good effort.     Clinical Decision Making: In considering the patient's current level of functioning, level of presumed impairment, nature of symptoms, emotional and behavioral responses during the interview, level of literacy, and observed level of motivation/effort, a battery of tests was selected and completed by patient.   Tests Administered   Animal Naming   [place] Naming   Controlled Oral Word Association   RBANS Line Orientation   Trail Making Test (Part A & B)  Wechsler Adult Intelligence Scale, 4th Edition (WAIS-IV)   Wechsler Memory Scale, 4th Edition (WAIS-IV); Older Adult Battery  Test Results: Note: Standardized scores are presented only for use by appropriately trained professionals and to allow for any future test-retest comparison. These scores should not be interpreted without consideration of all the information that is contained in the rest of the report. The most recent standardization samples from the test publisher or other sources were  used whenever possible to derive standard scores; scores were corrected for age, gender, ethnicity and education when available.   TEST SCORES:  Note: This summary of test scores accompanies the interpretive report and should not be considered in isolation without reference to the appropriate sections in the text. Descriptors are based on appropriate normative data and may be adjusted based on clinical judgment. The terms "impaired" and "within normal limits (WNL)" are used when a more specific level of functioning cannot be determined.    Validity Testing:     Descriptor         WAIS-IV Reliable Digit Span: --- --- Within Expectation         Intellectual Functioning:               Wechsler Adult Intelligence Scale (WAIS-IV):  Standard Score/ Scaled Score Percentile    Full Scale IQ  122 92 Well Above Average  GAI 114 82 Above Average  Verbal Comprehension Index: 118 88 Above Average  Similarities  12 75 Above Average  Vocabulary 14 91 Above Average  Information  13 84 Above Average  Perceptual Reasoning Index:  109 73 Average  Block Design  11 63 Average  Matrix Reasoning  14 91 Above Average  Visual Puzzles 10 50 Average  Working Memory Index: 131 98 Exceptionally High  Digit Span 15 95 Well Above Average  Arithmetic  16 98 Exceptionally  High  Processing Speed Index: 117 87 Above Average  Symbol Search  13 84 Above Average  Coding 13 84 Above Average         Memory:               Wechsler Memory Scale (WMS-IV): Older Adult                      Standard Score Percentile    Immediate Memory Index  126 96 Well Above Average  Delayed Memory Index  127 96 Well Above Average       Auditory Memory Index  133 99 Exceptionally High   Raw Score (Scaled Score)    Logical Memory I 43/53 (16) 98 Exceptionally High  Logical Memory II 27/39 (15) 95 Well Above Average  Logical Memory Recognition 23/23 >75 Above Average       Verbal Paired Associates I 30/40 (15) 95 Well Above Average   Verbal Paired Associates II 9/10 (15) 95 Well Above Average  Verbal Paired Associates Recognition 30/30 >75 Above Average        Standard Score Percentile   Visual Memory Index  108 70 Average  Wechsler Memory Scale (WMS-IV):                       Raw Score (Scaled Score) Percentile    Visual Reproduction I 27/43 (11) 63 Average  Visual Reproduction II 20/43 (12) 75 Above Average  Visual Reproduction Recognition 5/7 >75 Above Average       Attention/Executive Function:             Trail Making Test (TMT): Raw Score (Scaled Score) Percentile    Part A 27 secs., 0 errors (15) 95 Well Above Average  Part B 72 secs., 0 errors (14) 91 Above Average  *Based on [employer]'s Older Normative Studies (MOANS)             Scaled Score Percentile    WAIS-IV Coding: 13 84 Above Average           Scaled Score Percentile    WAIS-IV Digit Span: 15 95 Well Above Average  Forward 14 91 Above Average  Backward 15 95 Well Above Average  Sequencing 15 95 Well Above Average           Age-Scaled Score Percentile    Wechsler Memory Scale (WMS-IV) Symbol Span: 13 84 Above Average           Scaled Score Percentile    WAIS-IV Similarities: 13 84 Above Average         Language:               Verbal Fluency Test: Raw Score (Z-Score) Percentile    Phonemic Fluency (FAS) 43 (0.54) 71 Average  Animal Fluency 19 (0.63) 74 Average  *Tombaugh et al., 1999        Raw Score (T Score) Percentile    [place] Naming Test (BNT): 54/60 (51) 54 Average         Visuospatial/Visuoconstruction:          Raw Score Percentile    Clock Drawing: 10/10 --- Average  RBANS Line Orientation, Form C: 13/20 10-16 Below Average           Scaled Score Percentile    WAIS-IV Block Design: 11 63 Average  WAIS-IV Matrix Reasoning: 14 91 Above Average  WAIS-IV Visual Puzzles: 10 50 Average  Description of Test Results:  Premorbid verbal intellectual abilities were estimated to have been within the average range based on  education and occupational history. Current intellectual function was well above average. Psychomotor processing speed was above average. Auditory attention and working memory were exceptionally high. Visual-spatial construction was average. Language abilities were average. Specifically, confrontation naming and semantic verbal fluency were average. Auditory comprehension of complex ideational material was intact . With regard to verbal memory, encoding and acquisition of non-contextual information (i.e., paired-word list) was well above average. After a delay, free recall was also well above average. Performance on a yes/no recognition task was above average (e.g., ceiling effect). On another verbal memory test, encoding and acquisition of contextual auditory information (i.e., short stories) was exceptionally high. After a delay, free recall  was well above average. Performance on a yes/no recognition task was above average (e.g., ceiling effect). With regard to non-verbal memory, delayed free recall of visual information was above average. Executive functioning was intact overall. Mental flexibility and set-shifting were above average on Trails B. Verbal fluency with phonemic search restrictions was at the high end of the average range. Verbal abstract reasoning was above average. Non-verbal abstract reasoning was above average. Performance on a clock drawing task was intact.  Clinical Impressions: Generalized anxiety disorder, Persistent depressive disorder, No cognitive disorder.   Results of cognitive testing showed strong overall cognitive ability without areas of impairment. All performances were at least average, with most scores in the above average to exceptionally high range compared to age matched peers. Relative weakness was found in visuoconstruction and spatial judgment; associated with right parietal lobe. She did not demonstrate signs of visual neglect but mild trouble with angular  relationships and manipulating blocks to replicate a visual design.   Recent imaging does show some mild stable generalized cortical atrophy and T2/flair hyperintense foci predominantly in the deep white matter most consistent with moderate chronic microvascular ischemic change. Optimal control of vascular risk factors is necessary to reduce the risk of cognitive decline. Thankfully, these do not appear to be impacting her functioning at this time.   Meanwhile, there is evidence of longstanding low-grade depression and generalized anxiety. As such, the patient's subjective cognitive complaints are most likely secondary to these factors and psychosocial stress. There is no evidence of an underlying dementia or cognitive disorder at this time. I am hopeful that participation in CBT generalized anxiety, persistent depressive disorder, and chronic pain will result in improved quality of life as well as enhanced cognitive function in daily life. She may benefit from an updated eye exam to rule out other factors that may be contributing to potential change in visual perceptual skills (e.g., angular relationships) outside normal aging.   Recommendations/Plan: Based on the findings of the present evaluation, the following recommendations are offered:   1. Treatment for GAD, persistent depressive disorder, and chronic pain: This individual appears to be experiencing clinical symptoms of generalized anxiety and  chronic low grade depression. As such, engaging in psychotherapy should be considered to better manage mood symptomology. Therein the patient can work on identifying triggers for generalized anxiety and persistent depression to work towards developing healthy coping mechanisms and implementing lifestyle modifications that can help to support ongoing mental health. Chronic pain can also be addressed and targeted in therapy using Mindfulness and other CBT/ACT based interventions.   2. The patient will be  reassured that her cognitive test results were within normal limits and not indicative of a cognitive disorder or dementia  at this time. These test results will serve as a nice baseline for future comparison.  3. Neuropsychological re-evaluation is recommended in approximately one year (or sooner if there is a change in cognition or behavior) to monitor treatment efficacy, to assist with the management of the patient (e.g., pharmacological therapy), to determine any clinical and functional significance of brain abnormality over time, as well as to document any potential improvement or decline in cognitive functioning. Lastly, any follow-up testing will help delineate the specific cognitive basis of any new functional complaints.   4. The patient is encouraged to attend to lifestyle factors for brain health (e.g., regular physical exercise, good nutrition habits, regular participation in cognitively-stimulating activities, and general stress management techniques), which are likely to have benefits for both emotional adjustment and cognition.  In fact, in addition to promoting general good health, regular exercise incorporating aerobic activities (e.g., walking, swimming, water aerobics, etc.) has been demonstrated to be a very effective treatment for depression and stress, with similar efficacy rates to both antidepressant medication and psychotherapy.    Feedback to Patient:  Mary Buck will return for an interactive feedback session with this provider on 10/10/20 at which time her test performances, clinical impressions and treatment recommendations will be reviewed in detail. The patient understands she can contact our office should she require our assistance before this time.  Thank you for your referral of Mary Buck. Please feel free to contact me if you have any questions or concerns regarding this report.

## 2020-10-05 NOTE — Progress Notes (Addendum)
Neuropsychology Note  Mary Buck completed 240 minutes of neuropsychological testing with this provider.    Behavioral Observations:  Appearance: casually and appropriately dressed with normal hygiene. Gait: Able to walk independently without assistance but brought/uses cane for additional support; mild unsteadiness. Did not use cane appropriately during brief observation.  Speech: Clear, normal rate, normal tone & volume. Mild WFD.  Thought process:  Linear, organized, and logical. Mild impulsivity.  Mood/Affect: Depressed, somewhat blunted but laughed appropria.   Interpersonal: Guarded (slow to warm) and avoidant but polite and caring.  Orientation: Oriented x 4    Effort/Motivation: Good   She did not appear to have difficulty seeing, hearing, or understanding test instructions. The patient did not appear overtly distressed by the testing session, per behavioral observation or via self-report. She exhibited adequate distress tolerance on questions she did not know or tasks that were more difficult. She did however make frequent negative remarks about her abilities and other self-deprecating statements. She was self-critical and apologized after perceived errors.  She described feeling dizzy after 1 1/2 hours and we decided to take a 15-minute break. She described feeling better after eating a snack and drinking more water thus testing resumed. She completed all assigned tasks and gave good effort.     Clinical Decision Making: In considering the patient's current level of functioning, level of presumed impairment, nature of symptoms, emotional and behavioral responses during the interview, level of literacy, and observed level of motivation/effort, a battery of tests was selected and completed by patient.   Tests Administered   Animal Naming   [place] Naming   Controlled Oral Word Association   RBANS Line Orientation   Trail Making Test (Part A & B)  Wechsler Adult  Intelligence Scale, 4th Edition (WAIS-IV)   Wechsler Memory Scale, 4th Edition (WAIS-IV); Older Adult Battery  Results:  TEST SCORES:  Note: This summary of test scores accompanies the interpretive report and should not be considered in isolation without reference to the appropriate sections in the text. Descriptors are based on appropriate normative data and may be adjusted based on clinical judgment. The terms "impaired" and "within normal limits (WNL)" are used when a more specific level of functioning cannot be determined.    Validity Testing:     Descriptor         WAIS-IV Reliable Digit Span: --- --- Within Expectation         Intellectual Functioning:               Wechsler Adult Intelligence Scale (WAIS-IV):  Standard Score/ Scaled Score Percentile    Full Scale IQ  122 92 Well Above Average  GAI 114 82 Above Average  Verbal Comprehension Index: 118 88 Above Average  Similarities  12 75 Above Average  Vocabulary 14 91 Above Average  Information  13 84 Above Average  Perceptual Reasoning Index:  109 73 Average  Block Design  11 63 Average  Matrix Reasoning  14 91 Above Average  Visual Puzzles 10 50 Average  Working Memory Index: 131 98 Exceptionally High  Digit Span 15 95 Well Above Average  Arithmetic  16 98 Exceptionally High  Processing Speed Index: 117 87 Above Average  Symbol Search  13 84 Above Average  Coding 13 84 Above Average         Memory:               Wechsler Memory Scale (WMS-IV): Older Adult  Standard Score Percentile    Immediate Memory Index  126 96 Well Above Average  Delayed Memory Index  127 96 Well Above Average       Auditory Memory Index  133 99 Exceptionally High   Raw Score (Scaled Score)    Logical Memory I 43/53 (16) 98 Exceptionally High  Logical Memory II 27/39 (15) 95 Well Above Average  Logical Memory Recognition 23/23 >75 Above Average       Verbal Paired Associates I 30/40 (15) 95 Well Above Average  Verbal  Paired Associates II 9/10 (15) 95 Well Above Average  Verbal Paired Associates Recognition 30/30 >75 Above Average        Standard Score Percentile   Visual Memory Index  108 70 Average  Wechsler Memory Scale (WMS-IV):                       Raw Score (Scaled Score) Percentile    Visual Reproduction I 27/43 (11) 63 Average  Visual Reproduction II 20/43 (12) 75 Above Average  Visual Reproduction Recognition 5/7 >75 Above Average       Attention/Executive Function:             Trail Making Test (TMT): Raw Score (Scaled Score) Percentile    Part A 27 secs., 0 errors (15) 95 Well Above Average  Part B 72 secs., 0 errors (14) 91 Above Average  *Based on [employer] Older Normative Studies (MOANS)             Scaled Score Percentile    WAIS-IV Coding: 13 84 Above Average           Scaled Score Percentile    WAIS-IV Digit Span: 15 95 Well Above Average  Forward 14 91 Above Average  Backward 15 95 Well Above Average  Sequencing 15 95 Well Above Average           Age-Scaled Score Percentile    Wechsler Memory Scale (WMS-IV) Symbol Span: 13 84 Above Average           Scaled Score Percentile    WAIS-IV Similarities: 13 84 Above Average         Language:               Verbal Fluency Test: Raw Score (Z-Score) Percentile    Phonemic Fluency (FAS) 43 (0.54) 71 Average  Animal Fluency 19 (0.63) 74 Average  *Tombaugh et al., 1999        Raw Score (T Score) Percentile    [place] Naming Test (BNT): 54/60 (51) 54 Average         Visuospatial/Visuoconstruction:          Raw Score Percentile    Clock Drawing: 10/10 --- Average  RBANS Line Orientation, Form C: 13/20 10-16 Below Average           Scaled Score Percentile    WAIS-IV Block Design: 11 63 Average  WAIS-IV Matrix Reasoning: 14 91 Above Average  WAIS-IV Visual Puzzles: 10 50 Average           Feedback with Patient:  Mary Buck will return for an interactive feedback session with this provider on 10/10/20 at which time her test  performances, clinical impressions and treatment recommendations will be reviewed in detail. The patient understands she can contact our office should she require our assistance before this time.  Full report to follow.

## 2020-10-08 DIAGNOSIS — R2681 Unsteadiness on feet: Secondary | ICD-10-CM | POA: Diagnosis not present

## 2020-10-08 DIAGNOSIS — M48061 Spinal stenosis, lumbar region without neurogenic claudication: Secondary | ICD-10-CM | POA: Diagnosis not present

## 2020-10-09 ENCOUNTER — Encounter: Payer: Self-pay | Admitting: Psychology

## 2020-10-09 ENCOUNTER — Encounter (HOSPITAL_BASED_OUTPATIENT_CLINIC_OR_DEPARTMENT_OTHER): Payer: Medicare Other | Admitting: Psychology

## 2020-10-09 ENCOUNTER — Other Ambulatory Visit: Payer: Self-pay

## 2020-10-09 DIAGNOSIS — R413 Other amnesia: Secondary | ICD-10-CM | POA: Diagnosis not present

## 2020-10-09 DIAGNOSIS — F411 Generalized anxiety disorder: Secondary | ICD-10-CM | POA: Diagnosis not present

## 2020-10-09 DIAGNOSIS — F341 Dysthymic disorder: Secondary | ICD-10-CM

## 2020-10-09 DIAGNOSIS — G8929 Other chronic pain: Secondary | ICD-10-CM | POA: Diagnosis not present

## 2020-10-10 ENCOUNTER — Other Ambulatory Visit: Payer: Self-pay

## 2020-10-10 ENCOUNTER — Encounter: Payer: Self-pay | Admitting: Psychology

## 2020-10-10 ENCOUNTER — Encounter (HOSPITAL_BASED_OUTPATIENT_CLINIC_OR_DEPARTMENT_OTHER): Payer: Medicare Other | Admitting: Psychology

## 2020-10-10 DIAGNOSIS — F411 Generalized anxiety disorder: Secondary | ICD-10-CM

## 2020-10-10 DIAGNOSIS — R413 Other amnesia: Secondary | ICD-10-CM | POA: Diagnosis not present

## 2020-10-10 DIAGNOSIS — G8929 Other chronic pain: Secondary | ICD-10-CM | POA: Diagnosis not present

## 2020-10-10 DIAGNOSIS — F341 Dysthymic disorder: Secondary | ICD-10-CM | POA: Diagnosis not present

## 2020-10-12 NOTE — Progress Notes (Addendum)
Neuropsychology Feedback Appointment  Mary Buck returned for a feedback appointment today to review the results of her recent neuropsychological evaluation with this provider. 60 minutes face-to-face time was spent reviewing her test results, my impressions and my recommendations as detailed in her report. Education was provided about her mood conditions. Discussed treatment options and gave rational for CBT and mindfulness based interventions. The patient was given the opportunity to ask questions, and I did my best to answer these to her satisfaction. She agreed to have a follow up appointment in around 12 months to determine in repeat testing is needed. She also agreed to schedule biweekly individual therapy appointments to work on developing coping skills to improve mood, decreasing time spent worrying, live with pain, and process grief associated with untimely death of pet.   Below is a copy of the test scores, description of test results, and my clinical impressions/recommendations. A full copy of the report can be found in patient's EMR on 10/09/20. Initial consultation note placed in chart on 08/29/20.   ___________________________________________________________________________________________________________  Test Results: Note: Standardized scores are presented only for use by appropriately trained professionals and to allow for any future test-retest comparison. These scores should not be interpreted without consideration of all the information that is contained in the rest of the report. The most recent standardization samples from the test publisher or other sources were used whenever possible to derive standard scores; scores were corrected for age, gender, ethnicity and education when available.    TEST SCORES:    Validity Testing:     Descriptor         WAIS-IV Reliable Digit Span: --- --- Within Expectation         Intellectual Functioning:               Wechsler Adult  Intelligence Scale (WAIS-IV):  Standard Score/ Scaled Score Percentile    Full Scale IQ  122 92 Well Above Average  GAI 114 82 Above Average  Verbal Comprehension Index: 118 88 Above Average  Similarities  12 75 Above Average  Vocabulary 14 91 Above Average  Information  13 84 Above Average  Perceptual Reasoning Index:  109 73 Average  Block Design  11 63 Average  Matrix Reasoning  14 91 Above Average  Visual Puzzles 10 50 Average  Working Memory Index: 131 98 Exceptionally High  Digit Span 15 95 Well Above Average  Arithmetic  16 98 Exceptionally High  Processing Speed Index: 117 87 Above Average  Symbol Search  13 84 Above Average  Coding 13 84 Above Average         Memory:               Wechsler Memory Scale (WMS-IV): Older Adult                      Standard Score Percentile    Immediate Memory Index  126 96 Well Above Average  Delayed Memory Index  127 96 Well Above Average       Auditory Memory Index  133 99 Exceptionally High   Raw Score (Scaled Score)    Logical Memory I 43/53 (16) 98 Exceptionally High  Logical Memory II 27/39 (15) 95 Well Above Average  Logical Memory Recognition 23/23 >75 Above Average       Verbal Paired Associates I 30/40 (15) 95 Well Above Average  Verbal Paired Associates II 9/10 (15) 95 Well Above Average  Verbal Paired Associates Recognition 30/30 >  75 Above Average        Standard Score Percentile   Visual Memory Index  108 70 Average  Wechsler Memory Scale (WMS-IV):                       Raw Score (Scaled Score) Percentile    Visual Reproduction I 27/43 (11) 63 Average  Visual Reproduction II 20/43 (12) 75 Above Average  Visual Reproduction Recognition 5/7 >75 Above Average       Attention/Executive Function:             Trail Making Test (TMT): Raw Score (Scaled Score) Percentile    Part A 27 secs., 0 errors (15) 95 Well Above Average  Part B 72 secs., 0 errors (14) 91 Above Average  *Based on [employer]'s Older Normative Studies (MOANS)              Scaled Score Percentile    WAIS-IV Coding: 13 84 Above Average           Scaled Score Percentile    WAIS-IV Digit Span: 15 95 Well Above Average  Forward 14 91 Above Average  Backward 15 95 Well Above Average  Sequencing 15 95 Well Above Average           Age-Scaled Score Percentile    Wechsler Memory Scale (WMS-IV) Symbol Span: 13 84 Above Average           Scaled Score Percentile    WAIS-IV Similarities: 13 84 Above Average         Language:               Verbal Fluency Test: Raw Score (Z-Score) Percentile    Phonemic Fluency (FAS) 43 (0.54) 71 Average  Animal Fluency 19 (0.63) 74 Average  *Tombaugh et al., 1999        Raw Score (T Score) Percentile    Boston Naming Test (BNT): 54/60 (51) 54 Average         Visuospatial/Visuoconstruction:          Raw Score Percentile    Clock Drawing: 10/10 --- Average  RBANS Line Orientation, Form C: 13/20 10-16 Below Average           Scaled Score Percentile    WAIS-IV Block Design: 11 63 Average  WAIS-IV Matrix Reasoning: 14 91 Above Average  WAIS-IV Visual Puzzles: 10 50 Average          Description of Test Results:  Premorbid verbal intellectual abilities were estimated to have been within the average range based on education and occupational history. Current intellectual function was well above average. Psychomotor processing speed was above average. Auditory attention and working memory were exceptionally high. Visual-spatial construction was average. Language abilities were average. Specifically, confrontation naming and semantic verbal fluency were average. Auditory comprehension of complex ideational material was intact . With regard to verbal memory, encoding and acquisition of non-contextual information (i.e., paired-word list) was well above average. After a delay, free recall was also well above average. Performance on a yes/no recognition task was above average (e.g., ceiling effect). On another verbal memory test,  encoding and acquisition of contextual auditory information (i.e., short stories) was exceptionally high. After a delay, free recall  was well above average. Performance on a yes/no recognition task was above average (e.g., ceiling effect). With regard to non-verbal memory, delayed free recall of visual information was above average. Executive functioning was intact overall. Mental flexibility and set-shifting were above average on Trails  B. Verbal fluency with phonemic search restrictions was at the high end of the average range. Verbal abstract reasoning was above average. Non-verbal abstract reasoning was above average. Performance on a clock drawing task was intact.  Clinical Impressions: Generalized anxiety disorder, Persistent depressive disorder, No cognitive disorder.   Results of cognitive testing showed strong overall cognitive ability without areas of impairment. All performances were at least average, with most scores in the above average to exceptionally high range compared to age matched peers. Relative weakness was found in visuoconstruction and spatial judgment; associated with right parietal lobe. She did not demonstrate signs of visual neglect but mild trouble with angular relationships and manipulating blocks to replicate a visual design.   Recent imaging does show some mild stable generalized cortical atrophy and T2/flair hyperintense foci predominantly in the deep white matter most consistent with moderate chronic microvascular ischemic change. Optimal control of vascular risk factors is necessary to reduce the risk of cognitive decline. Thankfully, these do not appear to be impacting her functioning at this time.   Meanwhile, there is evidence of longstanding low-grade depression and generalized anxiety. As such, the patient's subjective cognitive complaints are most likely secondary to these factors and psychosocial stress. There is no evidence of an underlying dementia or cognitive  disorder at this time. I am hopeful that participation in CBT for generalized anxiety, persistent depressive disorder, and chronic pain will result in improved quality of life as well as enhanced cognitive function in daily life. She may benefit from an updated eye exam to rule out other factors that may be contributing to potential change in visual perceptual skills (e.g., angular relationships) outside normal aging.    Recommendations/Plan: Based on the findings of the present evaluation, the following recommendations are offered:   1. Treatment for depression/anxiety: This individual appears to be experiencing clinical symptoms of chronic low grade depression and generalized anxiety.  As such, engaging in psychotherapy should be considered to better manage mood symptomology. Therein the patient can work on identifying triggers for depression and anxiety as well as work towards developing healthy coping mechanisms and implementing lifestyle modifications that can help to support ongoing mental health. Chronic pain can also be addressed and targeted in therapy using Mindfulness and other CBT/ACT based interventions.   2. The patient will be reassured that her cognitive test results were within normal limits and not indicative of a cognitive disorder or dementia at this time. These test results will serve as a nice baseline for future comparison.  3. Neuropsychological re-evaluation is recommended in approximately one year (or sooner if there is a change in cognition or behavior) to monitor treatment efficacy, to assist with the management of the patient (e.g., pharmacological therapy), to determine any clinical and functional significance of brain abnormality over time, as well as to document any potential improvement or decline in cognitive functioning. Lastly, any follow-up testing will help delineate the specific cognitive basis of any new functional complaints.   4. The patient is encouraged to  attend to lifestyle factors for brain health (e.g., regular physical exercise, good nutrition habits, regular participation in cognitively-stimulating activities, and general stress management techniques), which are likely to have benefits for both emotional adjustment and cognition.  In fact, in addition to promoting general good health, regular exercise incorporating aerobic activities (e.g., walking, swimming, water aerobics, etc.) has been demonstrated to be a very effective treatment for depression and stress, with similar efficacy rates to both antidepressant medication and psychotherapy.    Feedback  to Patient:  Mary Buck will return for an interactive feedback session with this provider on 10/10/20 at which time her test performances, clinical impressions and treatment recommendations will be reviewed in detail. The patient understands she can contact our office should she require our assistance before this time.  Thank you for your referral of KOLBY MYUNG. Please feel free to contact me if you have any questions or concerns regarding this report.

## 2020-10-16 DIAGNOSIS — M47816 Spondylosis without myelopathy or radiculopathy, lumbar region: Secondary | ICD-10-CM | POA: Diagnosis not present

## 2020-10-18 ENCOUNTER — Ambulatory Visit: Payer: Medicare Other | Admitting: Psychology

## 2020-10-20 ENCOUNTER — Other Ambulatory Visit: Payer: Self-pay | Admitting: Family Medicine

## 2020-10-20 DIAGNOSIS — K219 Gastro-esophageal reflux disease without esophagitis: Secondary | ICD-10-CM

## 2020-10-23 ENCOUNTER — Ambulatory Visit: Payer: Commercial Indemnity | Admitting: Neurology

## 2020-10-23 DIAGNOSIS — Z79899 Other long term (current) drug therapy: Secondary | ICD-10-CM | POA: Diagnosis not present

## 2020-10-23 DIAGNOSIS — L400 Psoriasis vulgaris: Secondary | ICD-10-CM | POA: Diagnosis not present

## 2020-10-23 DIAGNOSIS — L570 Actinic keratosis: Secondary | ICD-10-CM | POA: Diagnosis not present

## 2020-10-23 DIAGNOSIS — L2089 Other atopic dermatitis: Secondary | ICD-10-CM | POA: Diagnosis not present

## 2020-10-23 DIAGNOSIS — Z85828 Personal history of other malignant neoplasm of skin: Secondary | ICD-10-CM | POA: Diagnosis not present

## 2020-10-25 ENCOUNTER — Inpatient Hospital Stay: Payer: Medicare Other | Attending: Hematology & Oncology

## 2020-10-25 ENCOUNTER — Encounter: Payer: Self-pay | Admitting: Family

## 2020-10-25 ENCOUNTER — Other Ambulatory Visit: Payer: Self-pay

## 2020-10-25 ENCOUNTER — Inpatient Hospital Stay (HOSPITAL_BASED_OUTPATIENT_CLINIC_OR_DEPARTMENT_OTHER): Payer: Medicare Other | Admitting: Family

## 2020-10-25 VITALS — BP 149/77 | HR 60 | Temp 97.7°F | Resp 20 | Wt 164.0 lb

## 2020-10-25 DIAGNOSIS — Z79899 Other long term (current) drug therapy: Secondary | ICD-10-CM | POA: Diagnosis not present

## 2020-10-25 DIAGNOSIS — D508 Other iron deficiency anemias: Secondary | ICD-10-CM | POA: Diagnosis not present

## 2020-10-25 DIAGNOSIS — D509 Iron deficiency anemia, unspecified: Secondary | ICD-10-CM | POA: Insufficient documentation

## 2020-10-25 LAB — CBC WITH DIFFERENTIAL (CANCER CENTER ONLY)
Abs Immature Granulocytes: 0.05 10*3/uL (ref 0.00–0.07)
Basophils Absolute: 0.1 10*3/uL (ref 0.0–0.1)
Basophils Relative: 1 %
Eosinophils Absolute: 0.6 10*3/uL — ABNORMAL HIGH (ref 0.0–0.5)
Eosinophils Relative: 12 %
HCT: 38.8 % (ref 36.0–46.0)
Hemoglobin: 12.4 g/dL (ref 12.0–15.0)
Immature Granulocytes: 1 %
Lymphocytes Relative: 31 %
Lymphs Abs: 1.7 10*3/uL (ref 0.7–4.0)
MCH: 30.8 pg (ref 26.0–34.0)
MCHC: 32 g/dL (ref 30.0–36.0)
MCV: 96.5 fL (ref 80.0–100.0)
Monocytes Absolute: 0.6 10*3/uL (ref 0.1–1.0)
Monocytes Relative: 12 %
Neutro Abs: 2.3 10*3/uL (ref 1.7–7.7)
Neutrophils Relative %: 43 %
Platelet Count: 212 10*3/uL (ref 150–400)
RBC: 4.02 MIL/uL (ref 3.87–5.11)
RDW: 13.1 % (ref 11.5–15.5)
WBC Count: 5.3 10*3/uL (ref 4.0–10.5)
nRBC: 0 % (ref 0.0–0.2)

## 2020-10-25 LAB — RETICULOCYTES
Immature Retic Fract: 10.3 % (ref 2.3–15.9)
RBC.: 4.01 MIL/uL (ref 3.87–5.11)
Retic Count, Absolute: 45.3 10*3/uL (ref 19.0–186.0)
Retic Ct Pct: 1.1 % (ref 0.4–3.1)

## 2020-10-25 NOTE — Progress Notes (Signed)
Hematology and Oncology Follow Up Visit  Mary Buck [id number] 05-31-1936 85 y.o. 10/25/2020   Principle Diagnosis:  Iron deficiency anemia   Current Therapy:        IV iron as indicated    Interim History:  Mary Buck is here today for follow-up. She is doing fairly well but still having hip and leg pain despite her lumbar radiofrequency ablation. She states that next procedure planned is an epidural. She is waiting to schedule.  No falls or syncope to report.  She ambulates with a cane for added support.  No fever, chills, n/v, cough, rash, dizziness, SOB, chest pain, palpitations, abdominal pain or changes in bowel or bladder habits.  No blood loss noted. No bruising or petechiae.  She wears compression stockings daily to reduce fluid retention and improve circulation.  She has maintained a good appetite and is doing her best to stay well hydrated. Her weight is stable at 164 lbs.   ECOG Performance Status: 1 - Symptomatic but completely ambulatory  Medications:  Allergies as of 10/25/2020      Reactions   Contrast Media [iodinated Diagnostic Agents] Anaphylaxis   Oxycodone Anaphylaxis   "my throat swells with any opioid"   Latex Other (See Comments)   Other reaction(s): blistering, redness    Mirtazapine Nausea Only   Acitretin Rash   Cephalexin Other (See Comments)   Unknown   Elemental Sulfur Other (See Comments)   unknown   Penicillins Other (See Comments)   Unknown   Sulfa Antibiotics Other (See Comments)   Unknown      Medication List       Accurate as of October 25, 2020 12:39 PM. If you have any questions, ask your nurse or doctor.        STOP taking these medications   clindamycin 300 MG capsule Commonly known as: Cleocin Stopped by: Laverna Peace, NP     TAKE these medications   acitretin 25 MG capsule Commonly known as: SORIATANE Take 25 mg by mouth 3 (three) times a week.   gabapentin 300 MG capsule Commonly known as:  NEURONTIN Take 1 capsule (300 mg total) by mouth 3 (three) times daily.   lactobacillus acidophilus Tabs tablet Take 1 tablet by mouth daily.   lansoprazole 30 MG capsule Commonly known as: PREVACID Take 1 capsule (30 mg total) by mouth 2 (two) times daily before a meal.   levothyroxine 88 MCG tablet Commonly known as: SYNTHROID TAKE 1 TABLET(88 MCG) BY MOUTH DAILY BEFORE BREAKFAST   misoprostol 100 MCG tablet Commonly known as: CYTOTEC as directed. Takes two after breakfast and dinner   simvastatin 20 MG tablet Commonly known as: ZOCOR Take 1 tablet (20 mg total) by mouth daily.   traMADol 50 MG tablet Commonly known as: ULTRAM Take 50 mg by mouth 3 (three) times daily as needed. Takes 50 mg bid then 100 mg   VITAMIN D-3 PO Take 1,000 Units by mouth daily.       Allergies:  Allergies  Allergen Reactions  . Contrast Media [Iodinated Diagnostic Agents] Anaphylaxis  . Oxycodone Anaphylaxis    "my throat swells with any opioid"  . Latex Other (See Comments)    Other reaction(s): blistering, redness   . Mirtazapine Nausea Only  . Acitretin Rash  . Cephalexin Other (See Comments)    Unknown  . Elemental Sulfur Other (See Comments)    unknown  . Penicillins Other (See Comments)    Unknown  . Sulfa Antibiotics Other (See  Comments)    Unknown    Past Medical History, Surgical history, Social history, and Family History were reviewed and updated.  Review of Systems: All other 10 point review of systems is negative.   Physical Exam:  weight is 164 lb (74.4 kg). Her oral temperature is 97.7 F (36.5 C). Her blood pressure is 149/77 (abnormal) and her pulse is 60. Her respiration is 20 and oxygen saturation is 98%.   Wt Readings from Last 3 Encounters:  10/25/20 164 lb (74.4 kg)  09/03/20 165 lb (74.8 kg)  08/29/20 158 lb 12.8 oz (72 kg)    Ocular: Sclerae unicteric, pupils equal, round and reactive to light Ear-nose-throat: Oropharynx clear, dentition  fair Lymphatic: No cervical or supraclavicular adenopathy Lungs no rales or rhonchi, good excursion bilaterally Heart regular rate and rhythm, no murmur appreciated Abd soft, nontender, positive bowel sounds MSK no focal spinal tenderness, no joint edema Neuro: non-focal, well-oriented, appropriate affect Breasts: Deferred   Lab Results  Component Value Date   WBC 5.3 10/25/2020   HGB 12.4 10/25/2020   HCT 38.8 10/25/2020   MCV 96.5 10/25/2020   PLT 212 10/25/2020   Lab Results  Component Value Date   FERRITIN 177 07/25/2020   IRON 97 07/25/2020   TIBC 200 (L) 07/25/2020   UIBC 104 (L) 07/25/2020   IRONPCTSAT 48 07/25/2020   Lab Results  Component Value Date   RETICCTPCT 1.1 10/25/2020   RBC 4.02 10/25/2020   No results found for: KPAFRELGTCHN, LAMBDASER, KAPLAMBRATIO No results found for: IGGSERUM, IGA, IGMSERUM No results found for: Ronnald Ramp, A1GS, A2GS, Tillman Sers, SPEI   Chemistry      Component Value Date/Time   NA 143 05/30/2020 1453   K 4.1 05/30/2020 1453   CL 105 05/30/2020 1453   CO2 33 (H) 05/30/2020 1453   BUN 15 05/30/2020 1453   CREATININE 0.92 05/30/2020 1453      Component Value Date/Time   CALCIUM 8.8 (L) 05/30/2020 1453   ALKPHOS 72 05/30/2020 1453   AST 15 05/30/2020 1453   ALT 10 05/30/2020 1453   BILITOT 0.4 05/30/2020 1453       Impression and Plan: Mary Buck is a very pleasant 85 yo female with history of iron deficiency anemia. Hgb is stable at 12.4, MCV 96 and platelets 212.  Iron studies are pending. We will replace if needed.  Lab only in 3 months and follow-up in 6 months.  She can contact our office with any questions or concerns. We can certainly see her sooner if needed.   Laverna Peace, NP 2/10/202212:39 PM

## 2020-10-26 LAB — IRON AND TIBC
Iron: 82 ug/dL (ref 41–142)
Saturation Ratios: 39 % (ref 21–57)
TIBC: 211 ug/dL — ABNORMAL LOW (ref 236–444)
UIBC: 129 ug/dL (ref 120–384)

## 2020-10-26 LAB — FERRITIN: Ferritin: 88 ng/mL (ref 11–307)

## 2020-10-30 DIAGNOSIS — M48061 Spinal stenosis, lumbar region without neurogenic claudication: Secondary | ICD-10-CM | POA: Diagnosis not present

## 2020-10-31 ENCOUNTER — Encounter: Payer: Self-pay | Admitting: Psychology

## 2020-10-31 ENCOUNTER — Other Ambulatory Visit: Payer: Self-pay

## 2020-10-31 ENCOUNTER — Encounter: Payer: Medicare Other | Attending: Psychology | Admitting: Psychology

## 2020-10-31 DIAGNOSIS — F411 Generalized anxiety disorder: Secondary | ICD-10-CM | POA: Diagnosis not present

## 2020-10-31 DIAGNOSIS — R0683 Snoring: Secondary | ICD-10-CM | POA: Diagnosis not present

## 2020-10-31 DIAGNOSIS — R4789 Other speech disturbances: Secondary | ICD-10-CM | POA: Diagnosis not present

## 2020-10-31 DIAGNOSIS — R413 Other amnesia: Secondary | ICD-10-CM | POA: Insufficient documentation

## 2020-10-31 DIAGNOSIS — F341 Dysthymic disorder: Secondary | ICD-10-CM | POA: Diagnosis not present

## 2020-10-31 DIAGNOSIS — G479 Sleep disorder, unspecified: Secondary | ICD-10-CM | POA: Diagnosis not present

## 2020-10-31 DIAGNOSIS — G8929 Other chronic pain: Secondary | ICD-10-CM

## 2020-10-31 NOTE — Progress Notes (Addendum)
Therapy Progress Note   DOS: 10/31/20 Session # 1/8 Start Time:  1:00PM End Time: 2:30PM  Subjective:    Name: Mary Buck   DOB: [birth year]  MRN: [id number]  Chief Complaint: Excessive worry, Depressed mood, chronic pain   The patient presents to her first individual psychotherapy appointment to reduce excessive worrying, elevate mood, and manage chronic pain more effectively.    HPI:   Mary Buck is an 85 year old,right-handed, female,with a history of anxiety and depressive symptoms dating back to early adolescence. Patient reportedly developed insomnia around 85 y.o. after having traumatic close encounter with a snake; stayed up worrying about possibility of venomous bite despite absence of physical signs/symptoms. Has struggled with insomnia since. OCD like symptoms (e.g., hand washing and checking) began around early adolescence and reportedly lasted 1-2 years. Excessive worrying fluctuated during late adolescence, adulthood, and older adult years, but remained mostly consistent across lifespan; described feeling anxious and nervous all her life.   GAD symptoms such as excessive worrying that is difficulty to control, feeling restless and on edge, feeling easily fatigued, irritability, muscle tension, and sleep disturbance (e.g., difficulty falling asleep, unsatisfying sleep) have been present for the majority of the time since early adulthood, without any prolonged periods of remission. These symptoms are not better explained by another mental disorder, physiological effects of substance, or another medical condition.   Moderate depressive symptoms with anxious distress (listed below) have been continually present most days to varying degrees since early adolescence. Such symptoms cause distress and impair social functioning (e.g., relational strain). Additionally, they are not better explained by another mental disorder, physiological effects of substance, or another medical  condition.   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  With regard to current cognition and memory function, she reports a recent history (e.g., within last 6-8 months) of short-term memory issues particularly forgetting conversations or details of a conversation, word finding difficulty and difficulty recalling names. She has no obvious family history of dementia. Both parents lived to be in their 85s, father died at 40 and mom back to be 14. She is the middle child of 5 total kids, she has 2 older brothers and 2 younger brothers.    The following portions of the patient's history were reviewed and updated as appropriate: allergies, current medications, past family history, past medical history, past social history, past surgical history and problem list. Review of Systems  Psychiatric/Behavioral: Positive for agitation, dysphoric mood and sleep disturbance. The patient is nervous/anxious.    Objective:  Physical Exam Psychiatric:        Attention and Perception: Perception normal. She is inattentive. She does not perceive auditory or visual hallucinations.        Mood and Affect: Mood is anxious and depressed (chronic low grade ). Mood is not elated. Affect is not labile, blunt, flat, angry, tearful or inappropriate.        Speech: She is communicative. Speech is tangential. Speech is not rapid and pressured, delayed or slurred.  Behavior: Behavior  is slowed and withdrawn. Behavior is not agitated, aggressive, hyperactive or combative. Behavior is cooperative.        Thought Content: Thought content is not paranoid or delusional. Thought content does not include homicidal or suicidal ideation. Thought content does not include homicidal or suicidal plan.        Cognition and Memory: Cognition and memory normal. Cognition is not impaired. Memory is not impaired. She does not exhibit impaired recent memory or impaired remote memory.        Judgment: Judgment normal. Judgment is not impulsive or inappropriate.   Lab Review:  not applicable   Assessment:   Generalized anxiety disorder  Persistent depressive disorder with anxious distress, currently moderate  Other chronic pain   Type of treatment: Individual Psychotherapy   Intervention: CBT   Participation: Alert and Active   Response: Good & Appropriate   Effectiveness/Progress:  She agreed to perform homework assignments outside therapy appointments. To early in process to comment on progress the week and   Therapist Response: Set agenda; provided rational for doing so. Performed mood check. Continued establishing rapport. Obtained update since feedback appointment from neuropsychological evaluation. Discussed patient's diagnoses and provided psychoeducation. Identified problems and set goals. Introduced cognitive model and provided more education. Discussed relevance to current problems. Provided summary and assessed understanding. Reviewed homework assignment. Elicited feedback. Answered all questions.   Therapy Goals: See initial treatment plan in chart dated 11/08/20.   Homework: Review cognitive model and engage in self monitoring exercises for 10-15 minutes at least 3 x week.     Medical Necessity: Services to be provided are reasonable and medically necessary for the diagnosis and/or treatment of illness/injury in order to improve the function: Yes   Services to be  provided are specific, effective and of a complexity and sophistication that requires the skills of a licensed psychologist or other qualified mental health professional: Yes  Services to be provided are of appropriate amount, duration and frequency within accepted standards of medical practice per the diagnosis: Yes  Is it anticipated that the patient will require more extensive therapy services, potentially over a longer period of time, than typical for the condition being treated? No   Plan:   We have patient scheduled for 7 more biweekly indiviudal therapy to work on goals outlined in treatment plan.   Next IND therapy appointment scheduled for 11/15/20.  Plan to target and modify disruptive sleep behaviors and racing thoughts at night via cognitive and behavioral techniques in addition to addressing mood symptoms.    I spent 90 minutes total face to face with patient during today's therapy appointment.

## 2020-11-01 DIAGNOSIS — M48061 Spinal stenosis, lumbar region without neurogenic claudication: Secondary | ICD-10-CM | POA: Diagnosis not present

## 2020-11-05 ENCOUNTER — Encounter: Payer: Self-pay | Admitting: Neurology

## 2020-11-05 ENCOUNTER — Ambulatory Visit: Payer: Medicare Other | Admitting: Neurology

## 2020-11-05 ENCOUNTER — Ambulatory Visit (INDEPENDENT_AMBULATORY_CARE_PROVIDER_SITE_OTHER): Payer: Medicare Other | Admitting: Neurology

## 2020-11-05 VITALS — BP 136/84 | HR 62 | Ht 65.0 in | Wt 163.0 lb

## 2020-11-05 DIAGNOSIS — G479 Sleep disorder, unspecified: Secondary | ICD-10-CM

## 2020-11-05 DIAGNOSIS — R413 Other amnesia: Secondary | ICD-10-CM | POA: Diagnosis not present

## 2020-11-05 DIAGNOSIS — G4733 Obstructive sleep apnea (adult) (pediatric): Secondary | ICD-10-CM

## 2020-11-05 NOTE — Patient Instructions (Signed)
It was nice to see you again today. As discussed, I would like for [place] to get you on an [employer] machine as soon as possible.  We will reach out to [facility] again to get an update.  Since you are okay using a machine that is not a [employer] brand, we can hopefully get you started on treatment soon.  We will follow you clinically and also reviewed your sleep apnea scores through the machine's data.  Please follow-up in about 3 months after being set up on treatment for your sleep apnea.  Thankfully, your memory evaluation did not suggest any significant memory disorder such as dementia.

## 2020-11-05 NOTE — Progress Notes (Signed)
Subjective:    Patient ID: Mary Buck is a 85 y.o. female.  HPI     Interim history:   Mary Buck is an 85 year old right-handed woman with an underlying complex medical history of lumbar spinal stenosis, history of bladder cancer, cervical spinal stenosis and degenerative spine disease, hypothyroidism, obesity, gait d/o, fibromyalgia, history of neuropathy, history of squamous cell cancer with status post radiation treatment to the left leg as well as treatment with acitretin, who Presents for FU consultation number memory loss and sleep apnea.  The patient is unaccompanied today.  I last saw her on 07/26/20, At which time she reported a several month history of short-term memory issues.  Her MMSE was 30 out of 30 at the time.  She was advised to proceed with additional evaluation in the form of brain MRI, sleep testing and neuropsychological evaluation.  Interim neuropsychological evaluation did not reveal any significant cognitive disorder.  She had a brain MRI without contrast on 08/02/2020 and I reviewed the results:    IMPRESSION: This MRI of the brain without contrast shows the following: 1.   Mild stable generalized cortical atrophy 2.   T2/flair hyperintense foci predominantly in the deep white matter most consistent with moderate chronic microvascular ischemic change.  None of the foci appear to be acute.    Her home sleep test on 08/28/2020 which indicated moderate obstructive sleep apnea with an AHI of 26.4/h, O2 nadir of 77%.  She was advised to start AutoPap therapy at home.    Today, 11/04/2020: She reports that she has not received her AutoPap machine yet.  She has had low back pain.  She has been in therapy.  She also reports discomfort in her knees.  She has been using a cane for walking.  She has had therapies through the neuropsychologist, they are working on cognitive behavioral strategies.  She has had injections into the lower back with Dr. Posey Pronto.  Previously  (copied from previous notes for reference):   07/26/20: She reports a several week or several month history of short-term memory issues particularly forgetting conversations or details of a conversation, word finding difficulty and difficulty recalling names.  She has no obvious family history of dementia.  Both parents lived to be in their 63s, father died at 24 and mom back to be 66.  She is the middle child of 5 total kids, she has 2 older brothers and 2 younger brothers.   She continues to have balance issues and feels unsteady walking.  She uses a cane.  She broke her right humerus and had to have a sling and prolonged physical therapy.  She is retired from [employer], [employer] and social work.  She likes to write poetry.  She lives in [place] with her partner.  She likes to drink coffee, about 6 cups/day.  She does not drink water very much, she has anxiety when drinking water, she can only drink sips at a time.  She does not take her meds with water, she crushes them and eats them with yogurt.  She recalls having had some form of memory testing in [place] some 5 years ago.  She can look for the records.   She reports anxiety and depression.  She is currently not on any medication for this.  She used to see a psychiatrist.  She has tried multiple medications but did not do well on any of them.   She had a recent fall in March of this year.  She  had a head CT and maxillofacial CT without contrast on 11/22/2019 and I reviewed the results: IMPRESSION: 1. No acute intracranial pathology. Small-vessel white matter disease.   2. Minimally displaced tripod morphology fractures of the right maxillary sinus walls and orbital floor. The zygomatic arch is intact. No other displaced fractures of the facial bones.     I had evaluated her last year for balance issues.  She had not had any recent falls at the time, but felt that she had worsening balance over the prior several months.  She had a brain MRI in July  2019 which showed some atrophy and chronic microvascular changes.  We had talked about her sleep difficulties as well, she reported snoring and some degree of daytime sleepiness.  She declined a sleep study at the time.  She would be willing to consider a home sleep test.  Epworth sleepiness score is 12 out of 24.   She had recent blood work including vitamin B12, vitamin D, this was done on 07/20/2020 and she also had additional iron studies yesterday.  TSH was normal in April 2021.       03/17/19: (She) reports worsening balance problems for the past few months.  Thankfully she has not fallen.  She fell in December 2018.  She reports forgetfulness and daytime sleepiness.  She takes tramadol 50 mg 3 times daily and also takes gabapentin, 300 mg twice daily during the day and 600 mg at night.  She denies a family history of Parkinson's disease.  She has wondered if she has Parkinson's-like changes.  She snores according to her partner.  She has never had a sleep study. She had a brain MRI without contrast on 04/05/2018 through [facility] and I reviewed the results:  IMPRESSION: Atrophy and chronic microvascular ischemic change in the white matter. No acute abnormality.  She had a cervical spine and lumbar spine MRI without contrast in October 2016 when she was in [place].  I was able to review the report she brought: Study date was 07/09/2015: She had disc protrusion at L2-3, prominent disc bulge and spondylitic changes at L4-5 with moderate bilateral neuroforaminal narrowing and mild bilateral lateral recess stenosis, at L5-S1 she had diffuse disc bulge and bony spinal changes causing moderate to marked bilateral neural foraminal narrowing without canal stenosis.  She had a prior lumbar spine MRI in April 2012 which was reference in this report.  The cervical spine MRI showed mild to moderate spondylitic changes throughout the cervical spine in combination with congenitally small spinal canal causing mild  canal stenosis at C4-5 with moderate to market bilateral neural foraminal narrowing and borderline canal stenosis at C5-6 with market bilateral neural foramina narrowing.  She had no evidence of focal cord compression or abnormal cord signal at the time.  She follows with a pain management physician through [facility]. She is retired, she lives with her partner, she quit smoking in July 2015, she does not drink alcohol, she likes to drink caffeine in the form of coffee, about 5 to 6 cups/day.  She does not drink a whole lot of water, estimates that she drinks about 16 ounces per day.  She has radiating lower back pain to the left leg.  She has an area of numbness in the left chin area where she received radiation therapy.   Her Past Medical History Is Significant For: Past Medical History:  Diagnosis Date  . Allergy   . Asthma   . Cancer ([place])   .  Cataract   . DDD (degenerative disc disease), lumbar 06/01/2017  . Depression   . GERD (gastroesophageal reflux disease)   . Heart murmur   . Hyperlipidemia   . IDA (iron deficiency anemia) 05/31/2020  . Peripheral neuropathy 04/30/2017  . Thyroid disease     Her Past Surgical History Is Significant For: Past Surgical History:  Procedure Laterality Date  . bladder cancer  2013  . CARPAL TUNNEL RELEASE  2015  . CATARACT EXTRACTION Bilateral 2012  . CHOLECYSTECTOMY    . COLONOSCOPY WITH ESOPHAGOGASTRODUODENOSCOPY (EGD)  02/22/2018   [facility]  . SALIVARY GLAND SURGERY Left 2015   benign  . SPINE SURGERY  6/31/21   vertaflex  . TONSILLECTOMY AND ADENOIDECTOMY  1943    Her Family History Is Significant For: Family History  Problem Relation Age of Onset  . Macular degeneration Mother   . Hyperlipidemia Father   . Stroke Father   . Cancer Brother        ? stomach  . Prostate cancer Other        in brothers  . Colon cancer Neg Hx     Her Social History Is Significant For: Social History    Socioeconomic History  . Marital status: Significant Other    Spouse name: Not on file  . Number of children: 0  . Years of education: 20  . Highest education level: Not on file  Occupational History  . Occupation: retired  Tobacco Use  . Smoking status: Former Smoker    Quit date: 2015    Years since quitting: 7.1  . Smokeless tobacco: Never Used  Vaping Use  . Vaping Use: Never used  Substance and Sexual Activity  . Alcohol use: No  . Drug use: No  . Sexual activity: Not on file  Other Topics Concern  . Not on file  Social History Narrative  . Not on file   Social Determinants of Health   Financial Resource Strain: Low Risk   . Difficulty of Paying Living Expenses: Not hard at all  Food Insecurity: No Food Insecurity  . Worried About Charity fundraiser in the Last Year: Never true  . Ran Out of Food in the Last Year: Never true  Transportation Needs: No Transportation Needs  . Lack of Transportation (Medical): No  . Lack of Transportation (Non-Medical): No  Physical Activity: Not on file  Stress: Not on file  Social Connections: Not on file    Her Allergies Are:  Allergies  Allergen Reactions  . Contrast Media [Iodinated Diagnostic Agents] Anaphylaxis  . Oxycodone Anaphylaxis    "my throat swells with any opioid"  . Latex Other (See Comments)    Other reaction(s): blistering, redness   . Mirtazapine Nausea Only  . Acitretin Rash  . Cephalexin Other (See Comments)    Unknown  . Elemental Sulfur Other (See Comments)    unknown  . Penicillins Other (See Comments)    Unknown  . Sulfa Antibiotics Other (See Comments)    Unknown  :   Her Current Medications Are:  Outpatient Encounter Medications as of 11/05/2020  Medication Sig  . acitretin (SORIATANE) 25 MG capsule Take 25 mg by mouth 3 (three) times a week.  . Cholecalciferol (VITAMIN D-3 PO) Take 1,000 Units by mouth daily.  Marland Kitchen gabapentin (NEURONTIN) 300 MG capsule Take 1 capsule (300 mg total) by  mouth 3 (three) times daily.  Marland Kitchen lactobacillus acidophilus (BACID) TABS tablet Take 1 tablet by mouth  daily.   . lansoprazole (PREVACID) 30 MG capsule Take 1 capsule (30 mg total) by mouth 2 (two) times daily before a meal.  . levothyroxine (SYNTHROID) 88 MCG tablet TAKE 1 TABLET(88 MCG) BY MOUTH DAILY BEFORE BREAKFAST  . misoprostol (CYTOTEC) 100 MCG tablet as directed. Takes two after breakfast and dinner  . simvastatin (ZOCOR) 20 MG tablet Take 1 tablet (20 mg total) by mouth daily.  . traMADol (ULTRAM) 50 MG tablet Take 50 mg by mouth 3 (three) times daily as needed. Takes 50 mg bid then 100 mg   No facility-administered encounter medications on file as of 11/05/2020.  :  Review of Systems:  Out of a complete 14 point review of systems, all are reviewed and negative with the exception of these symptoms as listed below: Review of Systems  Neurological:       Here for 3 month f/u. Pt reports since her last visit she has not been doing well. Reports increased pain in her legs mostly on the right side. She is having to using a cane everywhere she goes now. Her CPAP is on back order and reports the company will call once they have a machine to offer. She also reports trouble with trigger finger on both hands.    Objective:  Neurological Exam  Physical Exam Physical Examination:   Vitals:   11/05/20 1436  BP: 136/84  Pulse: 62  SpO2: 96%    General Examination: The patient is a very pleasant 85 y.o. female in no acute distress. She appears well-developed and well-nourished and well groomed.   HEENT:Normocephalic, atraumatic, pupils are equal, round and reactive to light. Tracking is preserved.Hearing is grossly intact. Face is symmetric with normal facial animation. Speech is clear with no dysarthria noted. There is no hypophonia. There is no lip, neck/head, jaw or voice tremor. Neck is supple with full range of passive and active motion. There are no carotid bruits on auscultation.  Oropharynx exam reveals: moderatemouth dryness, adequatedental hygiene and moderateairway crowding. Tongue protrudes centrally in palate elevates symmetrically.  She has a mild anterior flexion of her neck, stable.  Chest:Clear to auscultation without wheezing, rhonchi or crackles noted.  Heart:S1+S2+0, regular and normal without murmurs, rubs or gallops noted.   Abdomen:Soft, non-tender and non-distended with normal bowel sounds appreciated on auscultation.  Extremities:There isnopitting edema in the distal lower extremities bilaterally.   Skin: Warm and dry without trophic changes noted.  Musculoskeletal: exam reveals arthritic changes in both hands.  Reports bilateral knee discomfort.  Neurologically:  Mental status: The patient is awake, alert and oriented in all 4 spheres.Herimmediate and remote memory, attention, language skills and fund of knowledge are appropriate. There is no evidence of aphasia, agnosia, apraxia or anomia. Speech is clear with normal prosody and enunciation. Thought process is linear. Mood is normaland affect is normal.   (On 07/26/2020: MMSE: 30/30, AFT: 13/min.)  Cranial nerves II - XII are as described above under HEENT exam.   Motor exam: Normal bulk, strength and tone is noted. There is no drift or tremor. Fine motor skills and coordination: Grossly intact.  Cerebellar testing: No dysmetria or intention tremor. There is no truncal or gait ataxia.  Sensory exam: intact to light touch.  Gait, station and balance:Shestands with mild difficulty, posture is mildly stooped, may be slightly worse, increase in lumbar kyphosis noted.  She walks with slight insecurity but preserved arm swing, no shuffling, no freezing.  Has a single-point cane but is able to walk a little  bit without the cane.  Assessmentand Plan:   In summary,Kamyiah Morganis a very pleasant 68 year oldfemalewith an underlying complex medical history of lumbar spinal  stenosis, history of bladder cancer, cervical spinal stenosis and degenerative spine disease, hypothyroidism, balance problem, history of fall, obesity, fibromyalgia, history of neuropathy, history of squamous cell cancer with status post radiation treatment to the left leg as well as treatment with acitretin, and obstructive sleep apnea whopresents for follow-up consultation of her memory loss.  Neuropsychological evaluation did not reveal any evidence of neurocognitive disorder.  Brain MRI from November 2021 showed stable findings, mild atrophy, moderate white matter changes.  Home sleep testing in December 2021 showed moderate obstructive sleep apnea.  She is advised to start AutoPap therapy.  She has not yet been set up on AutoPap treatment due to a backlog in machines.  We will try to get her expedited and reach out to her DME company again.  We will plan a follow-up within 3 months after starting AutoPap therapy.  We talked about the importance of healthy lifestyle, good nutrition, good hydration, fall prevention, continuing with physical activity as much as possible, within her limitations of course.  She is encouraged to try to hydrate well with water.  She is advised to follow-up with her orthopedic specialist as scheduled and consider seeing an orthopedic surgeon for her knee pain, as she has not seen any specialist yet for her knees. I answered all her questions today and she was in agreement with the plan.  I spent 30 minutes in total face-to-face time and in reviewing records during pre-charting, more than 50% of which was spent in counseling and coordination of care, reviewing test results, reviewing medications and treatment regimen and/or in discussing or reviewing the diagnosis of memory loss, OSA, the prognosis and treatment options. Pertinent laboratory and imaging test results that were available during this visit with the patient were reviewed by me and considered in my medical decision making  (see chart for details).

## 2020-11-07 DIAGNOSIS — H3589 Other specified retinal disorders: Secondary | ICD-10-CM | POA: Diagnosis not present

## 2020-11-07 DIAGNOSIS — H5203 Hypermetropia, bilateral: Secondary | ICD-10-CM | POA: Diagnosis not present

## 2020-11-07 DIAGNOSIS — H52203 Unspecified astigmatism, bilateral: Secondary | ICD-10-CM | POA: Diagnosis not present

## 2020-11-07 DIAGNOSIS — H524 Presbyopia: Secondary | ICD-10-CM | POA: Diagnosis not present

## 2020-11-07 DIAGNOSIS — H35371 Puckering of macula, right eye: Secondary | ICD-10-CM | POA: Diagnosis not present

## 2020-11-07 DIAGNOSIS — H16143 Punctate keratitis, bilateral: Secondary | ICD-10-CM | POA: Diagnosis not present

## 2020-11-08 DIAGNOSIS — M48061 Spinal stenosis, lumbar region without neurogenic claudication: Secondary | ICD-10-CM | POA: Diagnosis not present

## 2020-11-11 ENCOUNTER — Other Ambulatory Visit: Payer: Self-pay | Admitting: Gastroenterology

## 2020-11-13 ENCOUNTER — Other Ambulatory Visit: Payer: Self-pay | Admitting: Gastroenterology

## 2020-11-13 ENCOUNTER — Telehealth: Payer: Self-pay

## 2020-11-13 MED ORDER — MISOPROSTOL 100 MCG PO TABS: 100.0000 ug | ORAL_TABLET | ORAL | 0 refills | Status: DC

## 2020-11-13 NOTE — Telephone Encounter (Signed)
Spoke to patient this morning to inquire about her medication Misoprostol. She is requesting refills for treatment to control her peptic ulcers and GERD symptoms. Prescription sent to her pharmacy. We will obtain records form her GI doctor in [place] Dr Aubery Lapping. She is scheduled for an appointment on 12/04/20. All questions answered. Patient voiced understanding.

## 2020-11-15 ENCOUNTER — Other Ambulatory Visit: Payer: Self-pay

## 2020-11-15 ENCOUNTER — Encounter: Payer: Self-pay | Admitting: Psychology

## 2020-11-15 ENCOUNTER — Encounter: Payer: Medicare Other | Attending: Psychology | Admitting: Psychology

## 2020-11-15 DIAGNOSIS — G8929 Other chronic pain: Secondary | ICD-10-CM | POA: Insufficient documentation

## 2020-11-15 DIAGNOSIS — M48061 Spinal stenosis, lumbar region without neurogenic claudication: Secondary | ICD-10-CM | POA: Diagnosis not present

## 2020-11-15 DIAGNOSIS — F341 Dysthymic disorder: Secondary | ICD-10-CM | POA: Diagnosis not present

## 2020-11-15 DIAGNOSIS — F411 Generalized anxiety disorder: Secondary | ICD-10-CM | POA: Diagnosis not present

## 2020-11-15 NOTE — Progress Notes (Addendum)
NEUROPSYCHOLOGY VISIT  Therapy Progress Note   DOS: 11/15/20 Visit # 2/8 Start Time: 11:00AM End Time: 12:00PM  Subjective:    Patient ID: Mary Buck is a 85 y.o. female.  DOB: 09-26-1935  MRN: [id number]  Chief Complaint: low mood, excessive worry, chronic pain  HPI-   Mary Buck is an 85 year old,right-handed, female,with a history of anxiety and depressive symptoms dating back to early adolescence. Patient reportedly developed insomnia around 85 y.o. after having traumatic close encounter with a snake; stayed up worrying about possibility of venomous bite despite absence of physical signs/symptoms. Has struggled with insomnia since. OCD like symptoms (e.g., hand washing and checking) began around early adolescence and reportedly lasted 1-2 years. Excessive worrying fluctuated during late adolescence, adulthood, and older adult years, but remained mostly consistent across lifespan; described feeling anxious and nervous all her life.   GAD symptoms such as excessive worrying that is difficulty to control, feeling restless and on edge, feeling easily fatigued, irritability, muscle tension, and sleep disturbance (e.g., difficulty falling asleep, unsatisfying sleep) have been present for the majority of the time since early adulthood, without any prolonged periods of remission. These symptoms are not better explained by another mental disorder, physiological effects of substance, or another medical condition.   Moderate depressive symptoms with anxious distress (listed below) have been continually present most days to varying degrees since early adolescence. Such symptoms cause distress and impair social functioning (e.g., relational strain). Additionally, they are not better explained by another mental disorder, physiological effects of substance, or another medical condition.   - Depressed mood - Insomnia - Low energy or fatigue - Low self-esteem  - Difficulty making  decisions - Feeling hopeless   - Unusually restless - Fear something awful may happen  - Feeling that she might lose control of herself  The following portions of the patient's history were reviewed and updated as appropriate: allergies, current medications, past family history, past medical history, past social history, past surgical history and problem list. Review of Systems  Musculoskeletal: Positive for back pain.  Psychiatric/Behavioral: Positive for agitation, dysphoric mood and sleep disturbance. The patient is nervous/anxious.     Current visit: States that she spent time reviewing all printed handouts from previous session and practicing homework. Gained some awareness into presence and high rate of cognitive distortions. Still grieving loss of pet. Feels disconnected from family (e.g., 2 brothers) and friends. Irritable and easily frustrated. Experiences road rage somewhat often.   Had follow up with Dr. Rexene Alberts on 11/05/20 to go over findings from comprehensive work up for memory and gait concerns. Discussed getting AutoPap machine as soon as possible.    Objective:  Physical Exam Neurological:     Mental Status: She is alert.  Psychiatric:        Attention and Perception: Perception normal. She is inattentive.        Mood and Affect: Mood is anxious and depressed.        Speech: Speech is tangential.        Behavior: Behavior is agitated, slowed and withdrawn. Behavior is cooperative.        Thought Content: Thought content normal.        Cognition and Memory: Cognition and memory normal.        Judgment: Judgment normal.   Lab Review:  not applicable  Assessment:   Generalized anxiety disorder  Persistent depressive disorder with anxious distress, currently moderate  Other chronic pain   Type of Treatment: IND Psychotherapy (  60 min)   Intervention: CBT   Participation: Alert and Active   Response/Effectiveness: Good and appropriate   Progress: Good. More  time is needed to achieve goals.   Therapy Goals: included in chart 11/08/20. Plan to review and update during 4th visit (12/11/20).   Therapist Response: Set agenda. Reviewed homework. Assessed mood. Continued building rapport. Reviewed treatment plan and goals. Reviewed cognitive model. Psychoeducation about emotions. Introduced thought and prediction logs as self-monitoring tools (e.g., record concrete automatic thoughts, feelings, behaviors, and reactions). Instructed on how to complete forms and practiced using real life scenarios/examples. Practiced deep breathing and mindfulness skills to calm and reduce anxiety as well as increase awareness of emotions in the present moment. Assigned homework. Elicited feedback. Answered questions.   Homework: Complete at least 1 entry using self-monitoring form daily. Practice relaxation and mindfulness at least 10-15 minutes daily.     Medical Necessity: Services provided are reasonable and medically necessary for the diagnosis and/or treatment of illness/injury in order to improve the function: Yes   Services to be provided are specific, effective and of a complexity and sophistication that requires the skills of a licensed psychologist or other qualified mental health professional: Yes   Services to be provided are of appropriate amount, duration and frequency within accepted standards of medical practice per the diagnosis: Yes  Is it anticipated that the patient will require more extensive therapy services, potentially over a longer period of time, than typical for the condition being treated? No  Plan:   Biweekly individual therapy appointments to work on treatment plan goals (11/08/20).    Next scheduled visit: 11/28/20 at 3:00PM  Content to be covered: Self-monitoring, cognitive reappraisal, and progressive muscle relaxation.    I spent 60 minutes total face-to-face with patient during today's therapy appointment.

## 2020-11-20 DIAGNOSIS — M48062 Spinal stenosis, lumbar region with neurogenic claudication: Secondary | ICD-10-CM | POA: Diagnosis not present

## 2020-11-27 DIAGNOSIS — M48061 Spinal stenosis, lumbar region without neurogenic claudication: Secondary | ICD-10-CM | POA: Diagnosis not present

## 2020-11-28 ENCOUNTER — Other Ambulatory Visit: Payer: Self-pay

## 2020-11-28 ENCOUNTER — Encounter (HOSPITAL_BASED_OUTPATIENT_CLINIC_OR_DEPARTMENT_OTHER): Payer: Medicare Other | Admitting: Psychology

## 2020-11-28 ENCOUNTER — Encounter: Payer: Self-pay | Admitting: Psychology

## 2020-11-28 DIAGNOSIS — G8929 Other chronic pain: Secondary | ICD-10-CM | POA: Diagnosis not present

## 2020-11-28 DIAGNOSIS — F341 Dysthymic disorder: Secondary | ICD-10-CM

## 2020-11-28 DIAGNOSIS — F411 Generalized anxiety disorder: Secondary | ICD-10-CM

## 2020-11-28 NOTE — Progress Notes (Addendum)
NEUROPSYCHOLOGY VISIT  Therapy Progress Note   DOS: 11/28/20 Visit # 3/8 Start Time: 3:00 PM  End Time: 4:00 PM   Subjective:    Patient ID: Mary Buck is a 85 y.o. female.  DOB: 1935/12/09  MRN: [id number]  Chief Complaint: Excessive worry, persistent low mood, leg and back pain  HPI   Mary Buck is an 85 year old,right-handed, female,with a history of anxiety and depressive symptoms dating back to early adolescence. Patient reportedly developed insomnia around 85 y.o. after having traumatic close encounter with a snake; stayed up worrying about possibility of venomous bite despite absence of physical signs/symptoms. Has struggled with insomnia since. OCD like symptoms (e.g., hand washing and checking) began around early adolescence and reportedly lasted 1-2 years. Excessive worrying fluctuated during late adolescence, adulthood, and older adult years, but remained mostly consistent across lifespan; described feeling anxious and nervous all her life.   GAD symptoms such as excessive worrying that is difficulty to control, feeling restless and on edge, feeling easily fatigued, irritability, muscle tension, and sleep disturbance (e.g., difficulty falling asleep, unsatisfying sleep) have been present for the majority of the time since early adulthood, without any prolonged periods of remission. These symptoms are not better explained by another mental disorder, physiological effects of substance, or another medical condition.   Moderate depressive symptoms with anxious distress (listed below) have been continually present most days to varying degrees since early adolescence. Such symptoms cause distress and impair social functioning (e.g., relational strain). Additionally, they are not better explained by another mental disorder, physiological effects of substance, or another medical condition.   - Depressed mood - Insomnia - Low energy or fatigue - Low self-esteem  -  Difficulty making decisions - Feeling hopeless (improving)   - Unusually restless - Fear something awful may happen  - Feeling that she might lose control of herself  The following portions of the patient's history were reviewed and updated as appropriate: current medications and problem list.  Review of Systems  Musculoskeletal: Positive for back pain.  Psychiatric/Behavioral: Positive for agitation (mild), dysphoric mood and sleep disturbance. Negative for suicidal ideas. The patient is nervous/anxious.    Previous visit (11/15/20): States that she spent time reviewing all printed handouts from previous session and practicing homework. Gained some awareness into presence and high rate of cognitive distortions. Still grieving loss of pet. Feels disconnected from family (e.g., 2 brothers) and friends. Irritable and easily frustrated. Experiences road rage somewhat often.   Current Visit (11/28/20): Describes leg pain as intense.  Worries about mobility and general functioning in the future.  Wonders if she will ever be able to run again. Admits to problems expressing needs and emotions to partner. States she would like to work on this in therapy in addition to other goals.   Objective:  Physical Exam Neurological:     Mental Status: She is alert.  Psychiatric:        Attention and Perception: She is attentive (normal for age). She does not perceive auditory or visual hallucinations.        Mood and Affect: Affect normal. Mood is anxious and depressed (mild low-grade).        Speech: Speech is tangential.        Behavior: Behavior is agitated, slowed and withdrawn (mild). Behavior is not aggressive, hyperactive or combative. Behavior is cooperative.        Thought Content: Thought content normal.        Cognition and Memory: Cognition and memory  normal.        Judgment: Judgment normal.   Lab Review:  not applicable  Assessment:   Generalized anxiety disorder  Persistent depressive  disorder  Other chronic pain   Treatment: [facility] Psychotherapy (60 min.)  Intervention: CBT   Participation: Alert & Active   Response/Effectiveness: Good and appropriate. Demonstrated good flexibility and abstraction during cognitive reappraisal exercise. Patient was able to come up with helpful alternative responses to more negativistic and self-degrading ones that were immediately considered and recorded in self-monitoring log. Displayed good ability to consider and generate more adaptive (alternative) responses and appreciate the impact on mood and behavior.    Progress: Good, Gradual improvement towards reaching goals. Outlined a new goal of improving communication with partner. Feels more hopeful.   Therapist response: Set agenda. Reviewed homework (thought log) entries. Explored feelings associated with each recorded situation and processed behavioral responses. Used socratic questioning to challenge automatic thoughts and encouraged consideration of helpful alternatives for a given situation. Processed difference in mood compared to original experience. Reviewed cognitive model to reinforce interconnection between thoughts, feelings, and behavior. Continued to practice cognitive reappraisal.   Normalized and validated feelings regarding communication concerns and relational distress with current partner. Discussed integrating assertiveness and communication training into future therapy visits to improve relationship satisfaction and personal well being. Elicited feedback. Answered questions. Assigned homework.   Therapy Goals:  See initial treatment plan in EMR 11/08/20. Will assess next visit 12/11/20. Goal for new problem (poor communication and assertiveness) to be included at time.  Homework: Unchanged.   Medical Necessity: Services to be provided are reasonable and medically necessary for the diagnosis and/or treatment of illness/injury in order to improve the function: Yes    Services to be provided are specific, effective and of a complexity and sophistication that requires the skills of a licensed psychologist or other qualified mental health professional: Yes  Services to be provided are of appropriate amount, duration and frequency within accepted standards of medical practice per the diagnosis: Yes  Is it anticipated that the patient will require more extensive therapy services, potentially over a longer period of time, than typical for the condition being treated? No  Plan:   Continue CBT for GAD, persistent depressive disorder, and chronic pain for the next 5 scheduled biweekly visits.  Assertiveness and communication training will also utilized to improve relationship satisfaction and personal well being.   Next scheduled visit: 12/11/20  Content to be covered: Progressive muscle relaxation, assertiveness and communication training, role playing exercise, Review progress and goals; modify accordingly.

## 2020-11-29 DIAGNOSIS — M48061 Spinal stenosis, lumbar region without neurogenic claudication: Secondary | ICD-10-CM | POA: Diagnosis not present

## 2020-12-04 ENCOUNTER — Encounter: Payer: Self-pay | Admitting: Gastroenterology

## 2020-12-04 ENCOUNTER — Ambulatory Visit (INDEPENDENT_AMBULATORY_CARE_PROVIDER_SITE_OTHER): Payer: Medicare Other | Admitting: Gastroenterology

## 2020-12-04 ENCOUNTER — Other Ambulatory Visit: Payer: Self-pay

## 2020-12-04 VITALS — BP 132/62 | HR 79 | Ht 65.0 in | Wt 161.5 lb

## 2020-12-04 DIAGNOSIS — K580 Irritable bowel syndrome with diarrhea: Secondary | ICD-10-CM

## 2020-12-04 DIAGNOSIS — K219 Gastro-esophageal reflux disease without esophagitis: Secondary | ICD-10-CM

## 2020-12-04 NOTE — Patient Instructions (Addendum)
If you are age 83 or older, your body mass index should be between 23-30. Your Body mass index is 26.88 kg/m. If this is out of the aforementioned range listed, please consider follow up with your Primary Care Provider.  If you are age 44 or younger, your body mass index should be between 19-25. Your Body mass index is 26.88 kg/m. If this is out of the aformentioned range listed, please consider follow up with your Primary Care Provider.    Due to recent changes in healthcare laws, you may see the results of your imaging and laboratory studies on MyChart before your provider has had a chance to review them.  We understand that in some cases there may be results that are confusing or concerning to you. Not all laboratory results come back in the same time frame and the provider may be waiting for multiple results in order to interpret others.  Please give [place] 48 hours in order for your provider to thoroughly review all the results before contacting the office for clarification of your results.   Follow up in 1 year.  Thank you for choosing me and [facility].  Vito Cirigliano, D.O.

## 2020-12-04 NOTE — Progress Notes (Signed)
P  Chief Complaint:    GERD, indigestion  GI History: 85 y.o. female with a history of chest pain, spinal stenosis with neurogenic claudication (follows at pain management), bladder CA, hypothyroidism, HLD, peripheral neuropathy, initially seen in the GI clinic in 04/2020 for evaluation of loose, nonbloody stools (but not watery diarrhea) w/ urgency.  Symptoms started following spine surgery approximately 5 weeks earlier, and was improving with dietary modifications alone at the time of initial appointment.  History of IBS-D.  Previously followed with a Copywriter, advertising in [place] for a longstanding history of loose, irregular stools, but described as non-bothersome to patient.  Essentially well-controlled with dietary modification alone.  Last colonoscopy 2019 in [place].  Longstanding history of reflux, well controlled with Prevacid BID and misoprostol (started in 2019 for gastritis on EGD), which she has been taking for years prescribed by her previous GI.  Worse with coffee and overeating.  Regurgitation with forward flexion. EGD 2019 in [place].  Endoscopic History -EGD (02/22/2018, Dr. Devoria Glassing, MD): Abnormal esophageal motility compatible with presbyesophagus,  normal esophageal mucosa, acute gastritis at GE junction, small sliding 2 cm hiatal hernia, bile induced gastritis with fluid in antrum. (path: non-H pylori gastritis) -Colonoscopy (02/22/2018, Dr. Devoria Glassing, MD): Normal   HPI:     Patient is a 85 y.o. female presenting to the Gastroenterology Clinic for follow-up.  Initially seen by me on 05/15/2020 for evaluation of loose stools.  Improving at that time with high-fiber diet.  Reflux was also generally well controlled with Prevacid, and added Pepcid QHS.  Today, she states she was still periodically have indigestion and reflux symptoms (regurgitation), particular postprandial and with dietary indiscretions.  Overall, symptoms are largely well controlled on  current therapy.  Labs in 10/2020 with normal CBC and iron panel.  Follows in the Hematology Clinic. No recent abdominal imaging for review.  Review of systems:     No chest pain, no SOB, no fevers, no urinary sx   Past Medical History:  Diagnosis Date  . Allergy   . Asthma   . Cancer ([place])   . Cataract   . DDD (degenerative disc disease), lumbar 06/01/2017  . Depression   . GERD (gastroesophageal reflux disease)   . Heart murmur   . Hyperlipidemia   . IDA (iron deficiency anemia) 05/31/2020  . Peripheral neuropathy 04/30/2017  . Thyroid disease     Patient's surgical history, family medical history, social history, medications and allergies were all reviewed in Epic    Current Outpatient Medications  Medication Sig Dispense Refill  . acitretin (SORIATANE) 10 MG capsule Take 10 mg by mouth as directed. Needs 20mg  one week and 30mg  the next week for 50mg  every 2 weeks.    . Cholecalciferol (VITAMIN D-3 PO) Take 1,000 Units by mouth daily.    Marland Kitchen gabapentin (NEURONTIN) 300 MG capsule Take 1 capsule (300 mg total) by mouth 3 (three) times daily. 90 capsule 4  . lactobacillus acidophilus (BACID) TABS tablet Take 1 tablet by mouth daily.     . lansoprazole (PREVACID) 30 MG capsule Take 1 capsule (30 mg total) by mouth 2 (two) times daily before a meal. 180 capsule 3  . levothyroxine (SYNTHROID) 88 MCG tablet TAKE 1 TABLET(88 MCG) BY MOUTH DAILY BEFORE BREAKFAST 90 tablet 1  . misoprostol (CYTOTEC) 100 MCG tablet Take 1 tablet (100 mcg total) by mouth as directed. Takes two after breakfast and dinner 120 tablet 0  . simvastatin (ZOCOR) 20 MG tablet Take 1 tablet (  20 mg total) by mouth daily. 90 tablet 3  . traMADol (ULTRAM) 50 MG tablet Take 50-100 mg by mouth 3 (three) times daily as needed. Takes 50 mg at breakfast and lunch or dinner then 100 mg at bedtime     No current facility-administered medications for this visit.    Physical Exam:     BP 132/62   Pulse 79   Ht 5\' 5"  (1.651  m)   Wt 161 lb 8 oz (73.3 kg)   BMI 26.88 kg/m   GENERAL:  Pleasant female in NAD PSYCH: : Cooperative, normal affect NEURO: Alert and oriented x 3, walks with cane assist   IMPRESSION and PLAN:    1) GERD 2) Nonulcer dyspepsia -Continue Prevacid and Pepcid -She has been taking misoprostol since 2019.  Typically only use this short-term.  Discussed the medication profile today. Going to try holding misoprostol, and if sxs do not worsen/return, can eliminate 1 drug. If return of sxs, will rewrite Rx.  3) IBS-D -Well-controlled with dietary modifications and fiber diet. -Can evaluate for clinical improvement while holding misoprostol (ADR diarrhea) as above.  Otherwise no medication changes made today   - RTC in 12 months or sooner as needed         Lavena Bullion ,DO, FACG 12/04/2020, 2:21 PM

## 2020-12-08 ENCOUNTER — Other Ambulatory Visit: Payer: Self-pay | Admitting: Gastroenterology

## 2020-12-09 NOTE — Progress Notes (Signed)
[facility] [address] [place], [place] [address] [phone]  Date:  12/13/2020   Name:  Mary Buck   DOB:  Aug 05, 1936   MRN:  [id number]  PCP:  Darreld Mclean, MD    Chief Complaint: Pain (Chronic pain, referral to PT)   History of Present Illness:  Mary Buck is a 85 y.o. very pleasant female patient who presents with the following:   Here today with concern of chronic pain  Last seen by myself in December; history of significant skin cancer problems, bladder cancer, spinal stenosis, hypothyroidism, hyperlipidemia, peripheral neuropathy, neurogenic claudication  She wonders about getting a 4th covid shot- we discussed this today. They plan to get this done ASAP She needs an rx for PT- she is seeing Delrae Alfred, last visit was about 2 weeks ago.  She does feel like physical therapy is helping her, she would like to continue doing this.  They are working on both her back pain and on her strength and balance   she has done RFA and injections for her back but unfortunately they don't seem to be helping all that much   She is taking gabapentin and every few days she also needs a tylenol which does help her a lot with her pain  I encouraged her that she can use tylenol as needed, as well as she follows package directions  Breane notes some difficulty with walking, she would like to do more activities such as going to the zoo but is worried about her walking stamina.  She will continue to work on exercise, but I also suggested she look into a mobility scooter   Patient Active Problem List   Diagnosis Date Noted  . Thyroid disease   . Heart murmur   . GERD (gastroesophageal reflux disease)   . Depression   . Cataract   . Cancer ([place])   . Asthma   . Allergy   . IDA (iron deficiency anemia) 05/31/2020  . Osteopenia 05/30/2019  . Pedal edema 01/31/2018  . Squamous cell carcinoma of skin of left  lower limb, including hip 12/16/2017  . Plantar fasciitis 07/07/2017  . DDD (degenerative disc disease), lumbar 06/01/2017  . Fibromyalgia 06/01/2017  . Spinal stenosis at L4-L5 level 04/30/2017  . Peripheral neuropathy 04/30/2017  . Encounter for therapeutic drug monitoring 03/05/2017  . Bladder cancer ([place]) 08/20/2015  . Arthropathy of lumbar facet joint 07/27/2015  . Spinal stenosis of cervical region 07/27/2015  . Spondylolisthesis 07/27/2015  . Hypothyroid 07/05/2015  . Abdominal pain, acute, left upper quadrant 06/28/2015  . Basal cell carcinoma, face 06/28/2015  . Bilateral groin pain 06/28/2015  . Chronic pain 06/28/2015  . Fatigue 06/28/2015  . Hyperlipidemia 06/28/2015  . Bilateral hip pain 06/28/2015  . Paresthesia of lower lip 06/28/2015  . Pulmonary nodule 06/28/2015  . Pustulosis palmaris et plantaris 06/28/2015  . Ulcer of nose 06/28/2015  . 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  . Mass of parotid gland 03/24/2014    Past Medical History:  Diagnosis Date  . Allergy   . Asthma   . Cancer ([place])   . Cataract   . DDD (degenerative disc disease), lumbar 06/01/2017  . Depression   . GERD (gastroesophageal reflux disease)   . Heart murmur   . Hyperlipidemia   . IDA (iron deficiency anemia) 05/31/2020  . Peripheral neuropathy 04/30/2017  . Thyroid disease     Past Surgical History:  Procedure  Laterality Date  . bladder cancer  2013  . CARPAL TUNNEL RELEASE  2015  . CATARACT EXTRACTION Bilateral 2012  . CHOLECYSTECTOMY    . COLONOSCOPY WITH ESOPHAGOGASTRODUODENOSCOPY (EGD)  02/22/2018   [facility]  . SALIVARY GLAND SURGERY Left 2015   benign  . SPINE SURGERY  6/31/21   vertaflex  . TONSILLECTOMY AND ADENOIDECTOMY  1943    Social History   Tobacco Use  . Smoking status: Former Smoker    Quit date: 2015    Years since quitting: 7.2  . Smokeless tobacco: Never Used  Vaping Use  . Vaping Use: Never used  Substance Use Topics  .  Alcohol use: No  . Drug use: No    Family History  Problem Relation Age of Onset  . Macular degeneration Mother   . Hyperlipidemia Father   . Stroke Father   . Cancer Brother        ? stomach  . Prostate cancer Other        in brothers  . Colon cancer Neg Hx     Allergies  Allergen Reactions  . Contrast Media [Iodinated Diagnostic Agents] Anaphylaxis  . Oxycodone Anaphylaxis    "my throat swells with any opioid"  . Latex Other (See Comments)    Other reaction(s): blistering, redness   . Mirtazapine Nausea Only  . Acitretin Rash  . Cephalexin Other (See Comments)    Unknown  . Elemental Sulfur Other (See Comments)    unknown  . Penicillins Other (See Comments)    Unknown  . Sulfa Antibiotics Other (See Comments)    Unknown    Medication list has been reviewed and updated.  Current Outpatient Medications on File Prior to Visit  Medication Sig Dispense Refill  . acitretin (SORIATANE) 10 MG capsule Take 10 mg by mouth as directed.    . Cholecalciferol (VITAMIN D-3 PO) Take 1,000 Units by mouth daily.    Marland Kitchen gabapentin (NEURONTIN) 300 MG capsule Take 1 capsule (300 mg total) by mouth 3 (three) times daily. 90 capsule 4  . lactobacillus acidophilus (BACID) TABS tablet Take 1 tablet by mouth daily.     . lansoprazole (PREVACID) 30 MG capsule Take 1 capsule (30 mg total) by mouth 2 (two) times daily before a meal. 180 capsule 3  . levothyroxine (SYNTHROID) 88 MCG tablet TAKE 1 TABLET(88 MCG) BY MOUTH DAILY BEFORE BREAKFAST 90 tablet 1  . misoprostol (CYTOTEC) 100 MCG tablet Take 1 tablet (100 mcg total) by mouth as directed. Takes two after breakfast and dinner 120 tablet 0  . simvastatin (ZOCOR) 20 MG tablet Take 1 tablet (20 mg total) by mouth daily. 90 tablet 3  . traMADol (ULTRAM) 50 MG tablet Take 50-100 mg by mouth 3 (three) times daily as needed. Takes 50 mg at breakfast and lunch or dinner then 100 mg at bedtime     No current facility-administered medications on file  prior to visit.    Review of Systems:  As per HPI- otherwise negative.   Physical Examination: Vitals:   12/13/20 1307  BP: 124/78  Pulse: 68  Resp: 17  SpO2: 97%   Vitals:   12/13/20 1307  Weight: 163 lb (73.9 kg)  Height: 5\' 5"  (1.651 m)   Body mass index is 27.12 kg/m. Ideal Body Weight: Weight in (lb) to have BMI = 25: 149.9  GEN: no acute distress.  Older woman in no distress, looks well and her normal self HEENT: Atraumatic, Normocephalic.  Ears  and Nose: No external deformity. CV: RRR, No M/G/R. No JVD. No thrill. No extra heart sounds. PULM: CTA B, no wheezes, crackles, rhonchi. No retractions. No resp. distress. No accessory muscle use. EXTR: No c/c/e PSYCH: Normally interactive. Conversant.    Assessment and Plan: Frailty - Plan: Ambulatory referral to Physical Therapy  Spinal stenosis of lumbar region, unspecified whether neurogenic claudication present  Acquired hypothyroidism  Chronic bilateral low back pain without sciatica - Plan: Ambulatory referral to Physical Therapy  Balance disorder - Plan: Ambulatory referral to Physical Therapy  Here today to follow-up on a few concerns.  Referral to physical therapy to work on her back pain, also strength and balance. Her pain is under okay control with current dose of gabapentin.  Acetaminophen also seems to be very helpful for her, I advised that she is fine to use this regularly if helpful  Thyroid level up-to-date, checked in December  We discussed her mobility.  She would like to be more active and do things with her partner.  I recommended that she look into a mobility scooter which may enable her to do more activities This visit occurred during the SARS-CoV-2 public health emergency.  Safety protocols were in place, including screening questions prior to the visit, additional usage of staff PPE, and extensive cleaning of exam room while observing appropriate contact time as indicated for disinfecting  solutions.    Signed Lamar Blinks, MD

## 2020-12-09 NOTE — Patient Instructions (Addendum)
Good to see you again today - take care Ok to use acetaminophen as needed for pain- this is safe to take as long as used within the package instructions We will re-up your PT orders You might look at getting a scooter to enable yourself to travel further distances when walking is too much   Please see me in about 6 months

## 2020-12-10 ENCOUNTER — Other Ambulatory Visit: Payer: Self-pay | Admitting: Family Medicine

## 2020-12-11 ENCOUNTER — Encounter (HOSPITAL_BASED_OUTPATIENT_CLINIC_OR_DEPARTMENT_OTHER): Payer: Medicare Other | Admitting: Psychology

## 2020-12-11 ENCOUNTER — Other Ambulatory Visit: Payer: Self-pay

## 2020-12-11 DIAGNOSIS — G8929 Other chronic pain: Secondary | ICD-10-CM | POA: Diagnosis not present

## 2020-12-11 DIAGNOSIS — F411 Generalized anxiety disorder: Secondary | ICD-10-CM | POA: Diagnosis not present

## 2020-12-11 DIAGNOSIS — F341 Dysthymic disorder: Secondary | ICD-10-CM | POA: Diagnosis not present

## 2020-12-11 NOTE — Progress Notes (Addendum)
NEUROPSYCHOLOGY VISIT  Therapy Progress Note   DOS:12/11/20 Visit#4/8 Start Time:1:00 PM  End Time: 2:00 PM  Subjective:    Patient ID: Mary Buck is a 85 y.o. female.  DOB: 1935-12-05  MRN: [id number]  HPI   Mary Buck is an 85 year old,right-handed, female,with a history of anxiety and depressive symptoms dating back to early adolescence. Patient reportedly developed insomnia around 85 y.o. after having traumatic close encounter with a snake; stayed up worrying about possibility of venomous bite despite absence of physical signs/symptoms. Has struggled with insomnia since. OCD like symptoms (e.g., hand washing and checking) began around early adolescence and reportedly lasted 1-2 years. Excessive worrying fluctuated during late adolescence, adulthood, and older adult years, but remained mostly consistent across lifespan; described feeling anxious and nervous all her life.   GAD symptoms such as excessive worrying that is difficulty to control, feeling restless and on edge, feeling easily fatigued, irritability, muscle tension, and sleep disturbance (e.g., difficulty falling asleep, unsatisfying sleep) have been present for the majority of the time since early adulthood, without any prolonged periods of remission. These symptoms are not better explained by another mental disorder, physiological effects of substance, or another medical condition.   Moderate depressive symptoms with anxious distress (listed below) have been continually present most days to varying degrees since early adolescence. Such symptoms cause distress and impair social functioning (e.g., relational strain). Additionally, they are not better explained by another mental disorder, physiological effects of substance, or another medical condition.   - Depressed mood - Insomnia - Low energy or fatigue - Low self-esteem  - Difficulty making decisions - Feeling hopeless (improving)   - Unusually  restless - Fear something awful may happen  - Feeling that she might lose control of herself  see previous neuropsychology evaluation note dated 10/09/2020 in EMR for additional details.   The following portions of the patient's history were reviewed and updated as appropriate: current medications, past medical history, past social history and problem list. Review of Systems  Musculoskeletal: Positive for back pain.  Psychiatric/Behavioral: Positive for agitation, dysphoric mood and sleep disturbance. The patient is nervous/anxious.     Previous Visit (11/28/20): Describes leg pain as intense.  Worries about mobility and general functioning in the future.  Wonders if she will ever be able to run again. Admits to problems expressing needs and emotions to partner. States she would like to work on this in therapy in addition to other goals.   Current Visit: Leg pain continues to cause distress and she worries about her mobility. States that she feels lonely and isolated in current relationship due to reduced intimacy. Has not discussed issue with partner for many years and worries it might be too late; does not want to "make waves".   Objective:  Physical Exam Constitutional:      Appearance: Normal appearance.  Neurological:     Mental Status: She is alert and oriented to person, place, and time.  Psychiatric:        Attention and Perception: Attention and perception normal.        Mood and Affect: Mood is anxious and depressed. Mood is not elated. Affect is not labile, blunt, flat, angry, tearful or inappropriate.        Speech: She is communicative. Speech is rapid and pressured, slurred and tangential (mild). Speech is not delayed.        Behavior: Behavior is agitated, slowed and withdrawn. Behavior is not aggressive, hyperactive or combative. Behavior is cooperative.  Thought Content: Thought content normal.        Cognition and Memory: Cognition and memory normal.        Judgment:  Judgment normal.   Lab Review:  not applicable  Assessment:   Generalized anxiety disorder  Persistent depressive disorder  Other chronic pain   Intervention: CBT, Assertiveness Training  Participation: Alert and Active   Response/Effectiveness: Good and appropriate   Progress: Improving towards meeting goals   Therapist Response: Set agenda. Mood check. Reviewed progress and goals. Education on assertive communication with printed handout. Taught acronym FBFDR (Fact, Belief, Feeling, Desired Response) to guide future conversations with partner. Encouraged patient to explore her wants and needs with partner and then used role play exercises to practice assertive responses to various real world situations. Taught progressive muscle relaxation. Explored triggers for generalized anxiety and persistent depression along with possible lifestyle modifications that can help support ongoing mental health.   Medical Necessity: Services to be provided are reasonable and medically necessary for the diagnosis and/or treatment of illness/injury in order to improve the function: Yes   Services to be provided are specific, effective and of a complexity and sophistication that requires the skills of a licensed psychologist or other qualified mental health professional: Yes  Services to be provided are of appropriate amount, duration and frequency within accepted standards of medical practice per the diagnosis: Yes  Is it anticipated that the patient will require more extensive therapy services, potentially over a longer period of time, than typical for the condition being treated? No  Plan:   CBT for GAD, persistent depressive disorder, and chronic pain for the next 4 weekly visits. Currently working on assertive communication to improve relationship with partner.  Next scheduled visit: 12/25/20

## 2020-12-12 ENCOUNTER — Encounter: Payer: Self-pay | Admitting: Psychology

## 2020-12-13 ENCOUNTER — Ambulatory Visit (INDEPENDENT_AMBULATORY_CARE_PROVIDER_SITE_OTHER): Payer: Medicare Other | Admitting: Family Medicine

## 2020-12-13 ENCOUNTER — Other Ambulatory Visit: Payer: Self-pay

## 2020-12-13 ENCOUNTER — Encounter: Payer: Self-pay | Admitting: Family Medicine

## 2020-12-13 VITALS — BP 124/78 | HR 68 | Resp 17 | Ht 65.0 in | Wt 163.0 lb

## 2020-12-13 DIAGNOSIS — E039 Hypothyroidism, unspecified: Secondary | ICD-10-CM

## 2020-12-13 DIAGNOSIS — G8929 Other chronic pain: Secondary | ICD-10-CM | POA: Diagnosis not present

## 2020-12-13 DIAGNOSIS — R54 Age-related physical debility: Secondary | ICD-10-CM | POA: Diagnosis not present

## 2020-12-13 DIAGNOSIS — M545 Low back pain, unspecified: Secondary | ICD-10-CM

## 2020-12-13 DIAGNOSIS — R2689 Other abnormalities of gait and mobility: Secondary | ICD-10-CM

## 2020-12-13 DIAGNOSIS — M48061 Spinal stenosis, lumbar region without neurogenic claudication: Secondary | ICD-10-CM

## 2020-12-24 DIAGNOSIS — M48062 Spinal stenosis, lumbar region with neurogenic claudication: Secondary | ICD-10-CM | POA: Diagnosis not present

## 2020-12-24 DIAGNOSIS — G894 Chronic pain syndrome: Secondary | ICD-10-CM | POA: Diagnosis not present

## 2020-12-24 DIAGNOSIS — M47816 Spondylosis without myelopathy or radiculopathy, lumbar region: Secondary | ICD-10-CM | POA: Diagnosis not present

## 2020-12-25 ENCOUNTER — Encounter: Payer: Medicare Other | Attending: Psychology | Admitting: Psychology

## 2020-12-25 ENCOUNTER — Other Ambulatory Visit: Payer: Self-pay

## 2020-12-25 ENCOUNTER — Encounter: Payer: Self-pay | Admitting: Psychology

## 2020-12-25 DIAGNOSIS — G8929 Other chronic pain: Secondary | ICD-10-CM | POA: Diagnosis not present

## 2020-12-25 DIAGNOSIS — F411 Generalized anxiety disorder: Secondary | ICD-10-CM | POA: Insufficient documentation

## 2020-12-25 DIAGNOSIS — F341 Dysthymic disorder: Secondary | ICD-10-CM | POA: Diagnosis not present

## 2020-12-25 NOTE — Progress Notes (Addendum)
NEUROPSYCHOLOGY VISIT  Therapy Progress Note   Name:  Mary Buck DOB:    [birth year] MRN:    [id number] DOS:   12/25/2020 Type of Service:  Individual psychotherapy Visit#   5/8 Start Time:  1:00 PM End Time:    2:00 PM  Subjective:    HPI-   Mary Buck is an 85 year old,right-handed, female,with a history of anxiety and depressive symptoms dating back to early adolescence. Patient reportedly developed insomnia around 85 y.o. after having traumatic close encounter with a snake; stayed up worrying about possibility of venomous bite despite absence of physical signs/symptoms. Has struggled with insomnia since. OCD like symptoms (e.g., hand washing and checking) began around early adolescence and reportedly lasted 1-2 years. Excessive worrying fluctuated during late adolescence, adulthood, and older adult years, but remained mostly consistent across lifespan; described feeling anxious and nervous all her life.   GAD symptoms such as excessive worrying that is difficulty to control, feeling restless and on edge, feeling easily fatigued, irritability, muscle tension, and sleep disturbance (e.g., difficulty falling asleep, unsatisfying sleep) have been present for the majority of the time since early adulthood, without any prolonged periods of remission. These symptoms are not better explained by another mental disorder, physiological effects of substance, or another medical condition.   Moderate depressive symptoms with anxious distress (listed below) have been continually present most days to varying degrees since early adolescence. Such symptoms cause distress and impair social functioning (e.g., relational strain). Additionally, they are not better explained by another mental disorder, physiological effects of substance, or another medical condition.   - Depressed mood - Insomnia - Low energy or fatigue - Low self-esteem  - Difficulty making decisions - Feeling  hopeless (improving)   - Unusually restless - Fear something awful may happen  - Feeling that she might lose control of herself  *see previous neuropsychology evaluation note dated 10/09/2020 in EMR for additional details.   The following portions of the patient's history were reviewed and updated as appropriate: allergies, current medications, past family history, past medical history, past social history, past surgical history and problem list. Review of Systems   Patient and/or Family's Strengths/Protective Factors: Social and Emotional competence and Concrete supports in place (healthy food, safe environments, etc.)  Previous Visit (12/11/20): Leg pain continues to cause distress and she worries about her mobility. States that she feels lonely and isolated in current relationship due to reduced intimacy. Has not discussed issue with partner for many years and worries it might be too late; does not want to "make waves".   Current Visit (12/25/20): Practiced assertive communication with partner and expressed needs directly. Reportedly felt less agitated and better about self the following day.   Objective:  Physical Exam Neurological:     Mental Status: She is alert and oriented to person, place, and time.  Psychiatric:        Attention and Perception: Perception normal. She is inattentive (mild).        Mood and Affect: Mood is anxious.        Speech: Speech is tangential (mild).        Behavior: Behavior is agitated (mild) and withdrawn.        Thought Content: Thought content normal.        Cognition and Memory: Cognition and memory normal.        Judgment: Judgment normal.   Lab Review:  not applicable  Assessment:   Persistent depressive disorder  Generalized anxiety disorder  Other  chronic pain   Intervention: CBT and assertiveness training  Participation: Alert and Active. Appropriate  Response/Effectiveness: Good and appropriate  Goals Addressed: (see treatment  plan dated 11/08/20 in chart)  Patient will: 1.  Reduce symptoms of: agitation, anxiety and depression  2.  Increase knowledge and/or ability of: coping skills, healthy habits, self-management skills and stress reduction, assertive communication   3.  Demonstrate ability to: Increase healthy adjustment to current life circumstances   Progress towards Goals: Ongoing  Therapist Response: Set Agenda. Assessed mood and pain. Reviewed homework. Reviewed acronym "FBFDR" (I.e., Fact, Belief, Feeling, Desired Response). Practice assertive communication via role play exercises.  Reviewed PMR and diaphragmatic breathing for relaxation.   Homework: Practice assertive communication with partner. Participate in at least 1 pleasurable activity per week.   Plan:   We have 3 more therapy visits scheduled to continue CBT for persistent depressive disorder and GAD. Assertiveness training is also being addressed to improve relationship with partner.  Next scheduled visit: 01/15/21      Alfonso Ellis, PsyD Clinical Neuropsychologist

## 2020-12-26 DIAGNOSIS — R2689 Other abnormalities of gait and mobility: Secondary | ICD-10-CM | POA: Diagnosis not present

## 2020-12-26 DIAGNOSIS — R54 Age-related physical debility: Secondary | ICD-10-CM | POA: Diagnosis not present

## 2020-12-26 DIAGNOSIS — G8929 Other chronic pain: Secondary | ICD-10-CM | POA: Diagnosis not present

## 2020-12-26 DIAGNOSIS — M545 Low back pain, unspecified: Secondary | ICD-10-CM | POA: Diagnosis not present

## 2020-12-27 ENCOUNTER — Ambulatory Visit: Payer: Commercial Indemnity | Admitting: Family Medicine

## 2021-01-04 DIAGNOSIS — R54 Age-related physical debility: Secondary | ICD-10-CM | POA: Diagnosis not present

## 2021-01-04 DIAGNOSIS — R2689 Other abnormalities of gait and mobility: Secondary | ICD-10-CM | POA: Diagnosis not present

## 2021-01-04 DIAGNOSIS — G8929 Other chronic pain: Secondary | ICD-10-CM | POA: Diagnosis not present

## 2021-01-04 DIAGNOSIS — M545 Low back pain, unspecified: Secondary | ICD-10-CM | POA: Diagnosis not present

## 2021-01-10 ENCOUNTER — Other Ambulatory Visit: Payer: Self-pay | Admitting: Gastroenterology

## 2021-01-11 ENCOUNTER — Other Ambulatory Visit: Payer: Self-pay | Admitting: Gastroenterology

## 2021-01-14 ENCOUNTER — Other Ambulatory Visit: Payer: Self-pay | Admitting: General Surgery

## 2021-01-15 ENCOUNTER — Encounter: Payer: Medicare Other | Attending: Psychology | Admitting: Psychology

## 2021-01-15 ENCOUNTER — Other Ambulatory Visit: Payer: Self-pay

## 2021-01-15 DIAGNOSIS — F988 Other specified behavioral and emotional disorders with onset usually occurring in childhood and adolescence: Secondary | ICD-10-CM | POA: Diagnosis not present

## 2021-01-15 DIAGNOSIS — G4733 Obstructive sleep apnea (adult) (pediatric): Secondary | ICD-10-CM | POA: Insufficient documentation

## 2021-01-15 DIAGNOSIS — R54 Age-related physical debility: Secondary | ICD-10-CM | POA: Diagnosis not present

## 2021-01-15 DIAGNOSIS — F341 Dysthymic disorder: Secondary | ICD-10-CM | POA: Insufficient documentation

## 2021-01-15 DIAGNOSIS — F411 Generalized anxiety disorder: Secondary | ICD-10-CM | POA: Insufficient documentation

## 2021-01-15 DIAGNOSIS — G479 Sleep disorder, unspecified: Secondary | ICD-10-CM | POA: Insufficient documentation

## 2021-01-15 DIAGNOSIS — G8929 Other chronic pain: Secondary | ICD-10-CM | POA: Diagnosis not present

## 2021-01-16 DIAGNOSIS — L401 Generalized pustular psoriasis: Secondary | ICD-10-CM | POA: Diagnosis not present

## 2021-01-16 DIAGNOSIS — L570 Actinic keratosis: Secondary | ICD-10-CM | POA: Diagnosis not present

## 2021-01-16 DIAGNOSIS — L308 Other specified dermatitis: Secondary | ICD-10-CM | POA: Diagnosis not present

## 2021-01-16 DIAGNOSIS — Z85828 Personal history of other malignant neoplasm of skin: Secondary | ICD-10-CM | POA: Diagnosis not present

## 2021-01-17 ENCOUNTER — Encounter: Payer: Self-pay | Admitting: Psychology

## 2021-01-17 DIAGNOSIS — G894 Chronic pain syndrome: Secondary | ICD-10-CM | POA: Diagnosis not present

## 2021-01-17 DIAGNOSIS — M47816 Spondylosis without myelopathy or radiculopathy, lumbar region: Secondary | ICD-10-CM | POA: Diagnosis not present

## 2021-01-17 DIAGNOSIS — M48062 Spinal stenosis, lumbar region with neurogenic claudication: Secondary | ICD-10-CM | POA: Diagnosis not present

## 2021-01-17 DIAGNOSIS — M5136 Other intervertebral disc degeneration, lumbar region: Secondary | ICD-10-CM | POA: Diagnosis not present

## 2021-01-17 NOTE — Progress Notes (Addendum)
NEUROPSYCHOLOGY VISIT  Therapy Progress Note   DOS:                          01/15/2021 Type of Service:        Individual psychotherapy Visit#                         6/8 # of Visits  7/8 Start Time:                1:00PM End Time:               2:00PM     Subjective:    Patient ID: Mary Buck is a 85 y.o. female.      DOB: [birth year]                    MRN: [id number]  Chief Complaint(s): Generalized anxiety, persistent depression, chronic pain, sleep disturbance  HPI   Mary Buck is an 85 year old,right-handed, female,with a history of anxiety and depressive symptoms dating back to early adolescence. Patient reportedly developed insomnia around 85 y.o. after having traumatic close encounter with a snake; stayed up worrying about possibility of venomous bite despite absence of physical signs/symptoms. Has struggled with insomnia since. OCD like symptoms (e.g., hand washing and checking) began around early adolescence and reportedly lasted 1-2 years. Excessive worrying fluctuated during late adolescence, adulthood, and older adult years, but remained mostly consistent across lifespan; described feeling anxious and nervous all her life.   GAD symptoms such as excessive worrying that is difficulty to control, feeling restless and on edge, feeling easily fatigued, irritability, muscle tension, and sleep disturbance (e.g., difficulty falling asleep, unsatisfying sleep) have been present for the majority of the time since early adulthood, without any prolonged periods of remission. These symptoms are not better explained by another mental disorder, physiological effects of substance, or another medical condition.   Moderate depressive symptoms with anxious distress (listed below) have been continually present most days to varying degrees since early adolescence. Such symptoms cause distress and impair social functioning (e.g., relational strain). Additionally, they are not better  explained by another mental disorder, physiological effects of substance, or another medical condition.   - Depressed mood - Insomnia - Low energy or fatigue - Low self-esteem  - Difficulty making decisions - Feeling hopeless (improving)   - Unusually restless - Fear something awful may happen  - Feeling that she might lose control of herself  -See previous neuropsychology evaluation note dated 10/09/2020 in EMR for comprehensive review.  The following portions of the patient's history were reviewed and updated as appropriate: allergies, current medications, past family history, past medical history, past social history, past surgical history and problem list. Review of Systems  Constitutional: Positive for fatigue.  Psychiatric/Behavioral: Positive for agitation, dysphoric mood and sleep disturbance. The patient is nervous/anxious.    Previous Visit (12/25/20): Practiced assertive communication with partner and expressed needs directly. Reportedly felt less agitated and better about self the following day.   Current Visit (01/15/21): States that she is experiencing severe knee pain and feeling agitated. Still waiting for CPAP. Has not called to check on availability in last 2-3 weeks. Acknowledges that she should and expresses intent this week. Patient recently learned that a friend from [place] will be moving to live with her. Mentions feeling excited but anxious at the same time. Worries a lot about "not pulling my weight around  the house" as partner "is on top of everything", among other topics. Worries about safety, especially falling, limits participation in activities. 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 Looking forward to attending a new lesbian group for older adult members. Expresses desire to join new writing group as well; previous group disbanded after main organizer passed away.     Objective:  Physical Exam Psychiatric:        Attention and Perception: Attention and perception  normal.        Mood and Affect: Mood is anxious and depressed.        Speech: Speech is tangential.        Behavior: Behavior is agitated, slowed and withdrawn. Behavior is cooperative.        Thought Content: Thought content does not include suicidal ideation. Thought content does not include suicidal plan.        Cognition and Memory: Cognition and memory normal.        Judgment: Judgment normal.   Lab Review:  not applicable  Assessment:   Generalized anxiety disorder  Persistent depressive disorder with anxious distress, currently moderate  Other chronic pain  Frailty  OSA (obstructive sleep apnea)  Sleep disturbance   Intervention: CBT for GAD and depression, Mindfulness for chronic pain   Participation: Alert and actively engaged in therapy  Response/Effectiveness: Good and appropriate.   Progress: Slow gradual progress towards meeting goals.   Therapist Response: Set agenda. Assessed mood. Reviewed homework and printed handout on assertive communication. Discussed recent conversations she had with partner and recreated them via role play exercise. Reviewed relaxation strategies. Continued to explore triggers and themes assicated with worry and depressed mood. Led 10 minute mindfulness exercise designed to build awareness and self-monitoring skills to identify and separate judgemental thoughts from her experience.Encouraged patient to practice nonjudgmental awareness when experiencing racing thoughts at night to reduce time spent worrying and decrease arousal. Suggested that she participate in at least 3 mindfulness exercises and 4 pleasurable activities during the next two weeks. Considered options. Discussed potential benefits of scheduling such activities and events to help with planning/logistics, follow through, time maximization, etc.   Homework: Participate in at least 3-4 pleasurable activities in the next 2 weeks. Initiate conversation with partner and practice  assertive communication skills to share wants and needs.   Medical Necessity: Services to be provided are reasonable and medically necessary for the diagnosis and/or treatment of illness/injury in order to improve the function: Yes   Services to be provided are specific, effective and of a complexity and sophistication that requires the skills of a licensed psychologist or other qualified mental health professional: Yes  Services to be provided are of appropriate amount, duration and frequency within accepted standards of medical practice per the diagnosis: Yes  Is it anticipated that the patient will require more extensive therapy services, potentially over a longer period of time, than typical for the condition being treated? No   Plan:   Continue CBT and assertiveness training for GAD and depressed mood, along with mindfulness based interventions for managing chronic pain.   We are also working on modifying disruptive sleep behaviors and racing thoughts at night via sleep hygiene education and CBT techniques.   We have 1 more scheduled visit. Additional therapy appointments will be scheduled based on medical necessity and patient preference.   Next Scheduled visit: 01/29/21  I spent 60 minutes face-to-face with patient during today's therapy appointment.

## 2021-01-18 DIAGNOSIS — M545 Low back pain, unspecified: Secondary | ICD-10-CM | POA: Diagnosis not present

## 2021-01-18 DIAGNOSIS — G8929 Other chronic pain: Secondary | ICD-10-CM | POA: Diagnosis not present

## 2021-01-18 DIAGNOSIS — R2689 Other abnormalities of gait and mobility: Secondary | ICD-10-CM | POA: Diagnosis not present

## 2021-01-21 ENCOUNTER — Other Ambulatory Visit: Payer: Self-pay | Admitting: Gastroenterology

## 2021-01-22 ENCOUNTER — Other Ambulatory Visit: Payer: Self-pay

## 2021-01-22 ENCOUNTER — Inpatient Hospital Stay: Payer: Medicare Other | Attending: Hematology & Oncology

## 2021-01-22 DIAGNOSIS — D509 Iron deficiency anemia, unspecified: Secondary | ICD-10-CM | POA: Insufficient documentation

## 2021-01-22 DIAGNOSIS — D508 Other iron deficiency anemias: Secondary | ICD-10-CM

## 2021-01-22 DIAGNOSIS — M545 Low back pain, unspecified: Secondary | ICD-10-CM | POA: Diagnosis not present

## 2021-01-22 DIAGNOSIS — R2689 Other abnormalities of gait and mobility: Secondary | ICD-10-CM | POA: Diagnosis not present

## 2021-01-22 DIAGNOSIS — G8929 Other chronic pain: Secondary | ICD-10-CM | POA: Diagnosis not present

## 2021-01-22 LAB — CBC WITH DIFFERENTIAL (CANCER CENTER ONLY)
Abs Immature Granulocytes: 0.01 10*3/uL (ref 0.00–0.07)
Basophils Absolute: 0.1 10*3/uL (ref 0.0–0.1)
Basophils Relative: 1 %
Eosinophils Absolute: 0.6 10*3/uL — ABNORMAL HIGH (ref 0.0–0.5)
Eosinophils Relative: 14 %
HCT: 38.9 % (ref 36.0–46.0)
Hemoglobin: 12.5 g/dL (ref 12.0–15.0)
Immature Granulocytes: 0 %
Lymphocytes Relative: 29 %
Lymphs Abs: 1.3 10*3/uL (ref 0.7–4.0)
MCH: 31.5 pg (ref 26.0–34.0)
MCHC: 32.1 g/dL (ref 30.0–36.0)
MCV: 98 fL (ref 80.0–100.0)
Monocytes Absolute: 0.5 10*3/uL (ref 0.1–1.0)
Monocytes Relative: 10 %
Neutro Abs: 2.1 10*3/uL (ref 1.7–7.7)
Neutrophils Relative %: 46 %
Platelet Count: 198 10*3/uL (ref 150–400)
RBC: 3.97 MIL/uL (ref 3.87–5.11)
RDW: 12.6 % (ref 11.5–15.5)
WBC Count: 4.5 10*3/uL (ref 4.0–10.5)
nRBC: 0 % (ref 0.0–0.2)

## 2021-01-22 LAB — RETICULOCYTES
Immature Retic Fract: 10.1 % (ref 2.3–15.9)
RBC.: 3.96 MIL/uL (ref 3.87–5.11)
Retic Count, Absolute: 49.9 10*3/uL (ref 19.0–186.0)
Retic Ct Pct: 1.3 % (ref 0.4–3.1)

## 2021-01-23 LAB — IRON AND TIBC
Iron: 77 ug/dL (ref 41–142)
Saturation Ratios: 35 % (ref 21–57)
TIBC: 224 ug/dL — ABNORMAL LOW (ref 236–444)
UIBC: 147 ug/dL (ref 120–384)

## 2021-01-23 LAB — FERRITIN: Ferritin: 61 ng/mL (ref 11–307)

## 2021-01-24 DIAGNOSIS — R2689 Other abnormalities of gait and mobility: Secondary | ICD-10-CM | POA: Diagnosis not present

## 2021-01-24 DIAGNOSIS — M545 Low back pain, unspecified: Secondary | ICD-10-CM | POA: Diagnosis not present

## 2021-01-24 DIAGNOSIS — G8929 Other chronic pain: Secondary | ICD-10-CM | POA: Diagnosis not present

## 2021-01-29 ENCOUNTER — Encounter (HOSPITAL_BASED_OUTPATIENT_CLINIC_OR_DEPARTMENT_OTHER): Payer: Medicare Other | Admitting: Psychology

## 2021-01-29 ENCOUNTER — Other Ambulatory Visit: Payer: Self-pay

## 2021-01-29 DIAGNOSIS — F411 Generalized anxiety disorder: Secondary | ICD-10-CM

## 2021-01-29 DIAGNOSIS — G8929 Other chronic pain: Secondary | ICD-10-CM | POA: Diagnosis not present

## 2021-01-29 DIAGNOSIS — G479 Sleep disorder, unspecified: Secondary | ICD-10-CM | POA: Diagnosis not present

## 2021-01-29 DIAGNOSIS — G4733 Obstructive sleep apnea (adult) (pediatric): Secondary | ICD-10-CM | POA: Diagnosis not present

## 2021-01-29 DIAGNOSIS — F341 Dysthymic disorder: Secondary | ICD-10-CM | POA: Diagnosis not present

## 2021-01-29 DIAGNOSIS — R54 Age-related physical debility: Secondary | ICD-10-CM | POA: Diagnosis not present

## 2021-01-29 NOTE — Progress Notes (Signed)
NEUROPSYCHOLOGY VISIT  Therapy Progress Note   DOS:01/29/2021 Type of Service:Individual psychotherapy Visit#7/8 # of Visits                   8/8 Start Time:1:00 PM End Time:   2:00 PM  Subjective:    Patient ID: Mary Buck is a 85 y.o. female. DOB:1937-09-12MRN: [id number]  Chief Complaint: Generalized anxiety, persistent depression, chronic pain, sleep disturbance  HPI   [name] is an 85 year old,right-handed, female,with a history of anxiety and depressive symptoms dating back to early adolescence. Patient reportedly developed insomnia around 85 y.o. after having traumatic close encounter with a snake; stayed up worrying about possibility of venomous bite despite absence of physical signs/symptoms. Has struggled with insomnia since. OCD like symptoms (e.g., hand washing and checking) began around early adolescence and reportedly lasted 1-2 years. Excessive worrying fluctuated during late adolescence, adulthood, and older adult years, but remained mostly consistent across lifespan; described feeling anxious and nervous all her life.   GAD symptoms such as excessive worrying that is difficulty to control, feeling restless and on edge, feeling easily fatigued, irritability, muscle tension, and sleep disturbance (e.g., difficulty falling asleep, unsatisfying sleep) have been present for the majority of the time since early adulthood, without any prolonged periods of remission. These symptoms are not better explained by another mental disorder, physiological effects of substance, or another medical condition.   Moderate depressive symptoms with anxious distress (listed below) have been continually present most days to varying degrees since early adolescence. Such symptoms cause distress and impair social functioning (e.g., relational strain). Additionally, they  are not better explained by another mental disorder, physiological effects of substance, or another medical condition.   - Depressed mood - Insomnia - Low energy or fatigue - Low self-esteem  - Difficulty making decisions - Feeling hopeless (improving)   - Unusually restless - Fear something awful may happen  - Feeling that she might lose control of herself  -See previous neuropsychology evaluation note dated 10/09/2020 in EMR for comprehensive review.  The following portions of the patient's history were reviewed and updated as appropriate: allergies, current medications, past family history, past medical history, past social history, past surgical history and problem list.  Review of Systems   Previous Visit (01/15/21): States that she is experiencing severe knee pain and feeling agitated. Still waiting for CPAP. Has not called to check on availability in last 2-3 weeks. Acknowledges that she should and expresses intent this week. Patient recently learned that a friend from [place] will be moving to live with her. Mentions feeling excited but anxious at the same time. Worries a lot about "not pulling my weight around the house" as partner "is on top of everything", among other topics. Worries about safety, especially falling, limits participation in activities. 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 Looking forward to attending a new lesbian group for older adult members. Expresses desire to join new writing group as well; previous group disbanded after main organizer passed away.    Objective:  Physical Exam Neurological:     Mental Status: She is alert and oriented to person, place, and time.     Motor: Weakness present.     Coordination: Coordination abnormal.     Gait: Gait abnormal.  Psychiatric:        Mood and Affect: Mood is anxious and depressed (mild). Affect is blunt.        Speech: Speech is tangential.        Behavior: Behavior is  agitated, slowed and withdrawn. Behavior is  cooperative.        Thought Content: Thought content is paranoid (mild). Thought content is not delusional. Thought content does not include homicidal or suicidal ideation. Thought content does not include homicidal or suicidal plan.        Cognition and Memory: Cognition and memory normal.        Judgment: Judgment normal.    Lab Review:  not applicable  Assessment:   Generalized anxiety disorder  Persistent depressive disorder with anxious distress, currently moderate  Other chronic pain   Intervention: CBT   Participation: Alert and Active. Good participation  Response/Effectiveness:  Progress: Improving towards meeting goals.  Therapist Response: Set agenda. Assessed mood. Reviewed homework. Encouraged her to identify triggers and worry themes.  Engaged patient in a 10 minute mindfulness activity. Processed this experience. Developed weekly activity schedule and brainstormed pleasurable activities.   Homework: Participate in at least 3-4 pleasurable activities in the next 2 weeks. Practice good sleep hygiene. Initiate conversation with partner and practice assertive communication skills to share wants and needs.   Medical Necessity: Services to be provided are reasonable and medically necessary for the diagnosis and/or treatment of illness/injury in order to improve the function: Yes   Services to be provided are specific, effective and of a complexity and sophistication that requires the skills of a licensed psychologist or other qualified mental health professional: Yes  Services to be provided are of appropriate amount, duration and frequency within accepted standards of medical practice per the diagnosis: Yes  Is it anticipated that the patient will require more extensive therapy services, potentially over a longer period of time, than typical for the condition being treated? No   Plan:   Continue CBT and mindfulness based interventions for chronic pain, GAD and  dysthymia. Disruptive sleep behaviors will also be targeted via sleep hygiene education and CBT techniques.   Given problems managing chronic pain and adverse impact reported on daily life and functioning, we scheduled another 8 health behavioral intervention appointments based on medical necessity and patient preference.   Next Scheduled visit: 02/14/21  I spent 60 minutes face-to-face with patient during today's therapy appointment.

## 2021-01-31 DIAGNOSIS — M545 Low back pain, unspecified: Secondary | ICD-10-CM | POA: Diagnosis not present

## 2021-01-31 DIAGNOSIS — R2689 Other abnormalities of gait and mobility: Secondary | ICD-10-CM | POA: Diagnosis not present

## 2021-01-31 DIAGNOSIS — G8929 Other chronic pain: Secondary | ICD-10-CM | POA: Diagnosis not present

## 2021-02-01 ENCOUNTER — Other Ambulatory Visit: Payer: Self-pay | Admitting: Family Medicine

## 2021-02-05 ENCOUNTER — Other Ambulatory Visit: Payer: Self-pay | Admitting: Gastroenterology

## 2021-02-05 DIAGNOSIS — M545 Low back pain, unspecified: Secondary | ICD-10-CM | POA: Diagnosis not present

## 2021-02-05 DIAGNOSIS — R2689 Other abnormalities of gait and mobility: Secondary | ICD-10-CM | POA: Diagnosis not present

## 2021-02-05 DIAGNOSIS — G8929 Other chronic pain: Secondary | ICD-10-CM | POA: Diagnosis not present

## 2021-02-07 ENCOUNTER — Encounter: Payer: Self-pay | Admitting: Psychology

## 2021-02-07 DIAGNOSIS — G8929 Other chronic pain: Secondary | ICD-10-CM | POA: Diagnosis not present

## 2021-02-07 DIAGNOSIS — R2689 Other abnormalities of gait and mobility: Secondary | ICD-10-CM | POA: Diagnosis not present

## 2021-02-07 DIAGNOSIS — M545 Low back pain, unspecified: Secondary | ICD-10-CM | POA: Diagnosis not present

## 2021-02-14 ENCOUNTER — Encounter: Payer: Self-pay | Admitting: Psychology

## 2021-02-14 ENCOUNTER — Other Ambulatory Visit: Payer: Self-pay

## 2021-02-14 ENCOUNTER — Encounter: Payer: Medicare Other | Attending: Psychology | Admitting: Psychology

## 2021-02-14 DIAGNOSIS — G8929 Other chronic pain: Secondary | ICD-10-CM

## 2021-02-14 DIAGNOSIS — F341 Dysthymic disorder: Secondary | ICD-10-CM

## 2021-02-14 DIAGNOSIS — F411 Generalized anxiety disorder: Secondary | ICD-10-CM

## 2021-02-14 NOTE — Progress Notes (Signed)
NEUROPSYCHOLOGY VISIT   Therapy Progress Note   DOS:                           02/14/2021 Type of Service:        Health Behavioral Intervention   Start Time:                 2:00 PM End Time:                   3:00 PM  Subjective:    Patient ID: Mary Buck is a 85 y.o. female.  DOB: 27-Mar-1936                    MRN: [id number]   Chief Complaint: chronic pain, generalized anxiety, persistent depression, sleep disturbance   HPI  Mary Buck is an 85 year old, right-handed, female, with a history of anxiety and depressive symptoms dating back to early adolescence. Patient reportedly developed insomnia around 85 y.o. after having traumatic close encounter with a snake; stayed up worrying about possibility of venomous bite despite absence of physical signs/symptoms. Has struggled with insomnia since. OCD like symptoms (e.g., hand washing and checking) began around early adolescence and reportedly lasted 1-2 years. Excessive worrying fluctuated during late adolescence, adulthood, and older adult years, but remained mostly consistent across lifespan; described feeling anxious and nervous all her life.   GAD symptoms such as excessive worrying that is difficulty to control, feeling restless and on edge, feeling easily fatigued, irritability, muscle tension, and sleep disturbance (e.g., difficulty falling asleep, unsatisfying sleep) have been present for the majority of the time since early adulthood, without any prolonged periods of remission. These symptoms are not better explained by another mental disorder, physiological effects of substance, or another medical condition.   Moderate depressive symptoms with anxious distress (listed below) have been continually present most days to varying degrees since early adolescence. Such symptoms cause distress and impair social functioning (e.g., relational strain). Additionally, they are not better explained by another mental disorder, physiological  effects of substance, or another medical condition.   - Depressed mood - Insomnia - Low energy or fatigue - Low self-esteem - Difficulty making decisions - Feeling hopeless (improving)    - Unusually restless - Fear something awful may happen - Feeling that she might lose control of herself   -See previous neuropsychology evaluation note dated 10/09/2020 in EMR for comprehensive review.   The following portions of the patient's history were reviewed and updated as appropriate: allergies, current medications, past family history, past medical history, past social history, past surgical history and problem list.  The following portions of the patient's history were reviewed and updated as appropriate: allergies, current medications, past family history, past medical history, past social history, past surgical history, and problem list.  Review of Systems  Psychiatric/Behavioral:  Positive for agitation, dysphoric mood and sleep disturbance. The patient is nervous/anxious.     Considering getting a new pet cat.   Objective:  Physical Exam Neurological:     Mental Status: She is alert.  Psychiatric:        Attention and Perception: Perception normal. She is inattentive (mild). She does not perceive auditory or visual hallucinations.        Mood and Affect: Mood is anxious and depressed.        Speech: Speech is tangential.        Behavior: Behavior is agitated (more easily),  slowed and withdrawn. Behavior is cooperative.        Thought Content: Thought content normal.        Cognition and Memory: Cognition and memory normal.    Lab Review:  not applicable  Assessment:   Generalized anxiety disorder  Persistent depressive disorder with anxious distress, currently moderate  Other chronic pain  Intervention: CBT; Termination of therapeutic relationship   Participation: Alert and Active. Good participation    Response/Effectiveness: Good and appropriate    Progress:  Improving towards meeting goals outlined in treatment plan.   Therapist Response: Set agenda and assessed mood. Reviewed homework and activity level since last visit. Modified weekly activity schedule and recorded new pleasurable activities. Led 10 minute mindfulness exercise designed to build awareness and self-monitoring skills to identify and separate judgemental thoughts from her experience.   Informed patient that I will be resigning from the neuropsychology position at [facility] and discussed option to meet with Dr. Sima Matas for an initial consult, see another qualified provider, or discontinue services altogether.    Homework: Continue participating in 3-4 pleasurable activities per week. Practice good sleep hygiene.    Medical Necessity: Services to be provided are reasonable and medically necessary for the diagnosis and/or treatment of illness/injury in order to improve the function: Yes  Services to be provided are specific, effective and of a complexity and sophistication that requires the skills of a licensed psychologist or other qualified mental health professional: Yes  Services to be provided are of appropriate amount, duration and frequency within accepted standards of medical practice per the diagnosis: Yes  Is it anticipated that the patient will require more extensive therapy services, potentially over a longer period of time, than typical for the condition being treated? No   Plan:   No follow up with this provider required. Patient agreed to meet Dr. Sima Matas for an initial consult on 04/01/21 to reassess goals and determine amount, duration, and frequency of future therapy.   I spent 60 minutes face-to-face with patient during today's therapy appointment.   Billing/Service Summary:  [phone] (Health behavior intervention, individual, face-to-face; initial 30 minutes) x1 [phone] (Health behavior intervention, individual, face-to-face;; each additional 15 minutes) x  2

## 2021-02-18 DIAGNOSIS — G8929 Other chronic pain: Secondary | ICD-10-CM | POA: Diagnosis not present

## 2021-02-18 DIAGNOSIS — M545 Low back pain, unspecified: Secondary | ICD-10-CM | POA: Diagnosis not present

## 2021-02-20 DIAGNOSIS — G8929 Other chronic pain: Secondary | ICD-10-CM | POA: Diagnosis not present

## 2021-02-20 DIAGNOSIS — M545 Low back pain, unspecified: Secondary | ICD-10-CM | POA: Diagnosis not present

## 2021-02-26 ENCOUNTER — Ambulatory Visit: Payer: Medicare Other | Admitting: Psychology

## 2021-03-04 ENCOUNTER — Encounter: Payer: Self-pay | Admitting: Family Medicine

## 2021-03-04 ENCOUNTER — Other Ambulatory Visit: Payer: Self-pay | Admitting: Gastroenterology

## 2021-03-04 DIAGNOSIS — M545 Low back pain, unspecified: Secondary | ICD-10-CM

## 2021-03-04 DIAGNOSIS — L309 Dermatitis, unspecified: Secondary | ICD-10-CM | POA: Diagnosis not present

## 2021-03-04 DIAGNOSIS — Z85828 Personal history of other malignant neoplasm of skin: Secondary | ICD-10-CM | POA: Diagnosis not present

## 2021-03-04 DIAGNOSIS — M793 Panniculitis, unspecified: Secondary | ICD-10-CM | POA: Diagnosis not present

## 2021-03-04 DIAGNOSIS — M48061 Spinal stenosis, lumbar region without neurogenic claudication: Secondary | ICD-10-CM

## 2021-03-04 DIAGNOSIS — L403 Pustulosis palmaris et plantaris: Secondary | ICD-10-CM | POA: Diagnosis not present

## 2021-03-04 DIAGNOSIS — G8929 Other chronic pain: Secondary | ICD-10-CM

## 2021-03-04 DIAGNOSIS — L570 Actinic keratosis: Secondary | ICD-10-CM | POA: Diagnosis not present

## 2021-03-11 DIAGNOSIS — M545 Low back pain, unspecified: Secondary | ICD-10-CM | POA: Diagnosis not present

## 2021-03-11 DIAGNOSIS — G8929 Other chronic pain: Secondary | ICD-10-CM | POA: Diagnosis not present

## 2021-03-15 DIAGNOSIS — R2689 Other abnormalities of gait and mobility: Secondary | ICD-10-CM | POA: Diagnosis not present

## 2021-03-15 DIAGNOSIS — M545 Low back pain, unspecified: Secondary | ICD-10-CM | POA: Diagnosis not present

## 2021-03-15 DIAGNOSIS — G8929 Other chronic pain: Secondary | ICD-10-CM | POA: Diagnosis not present

## 2021-03-19 ENCOUNTER — Ambulatory Visit: Payer: Medicare Other | Admitting: Psychology

## 2021-03-21 DIAGNOSIS — M545 Low back pain, unspecified: Secondary | ICD-10-CM | POA: Diagnosis not present

## 2021-03-21 DIAGNOSIS — G8929 Other chronic pain: Secondary | ICD-10-CM | POA: Diagnosis not present

## 2021-03-21 DIAGNOSIS — R2689 Other abnormalities of gait and mobility: Secondary | ICD-10-CM | POA: Diagnosis not present

## 2021-03-25 ENCOUNTER — Other Ambulatory Visit: Payer: Self-pay | Admitting: Gastroenterology

## 2021-03-25 DIAGNOSIS — G894 Chronic pain syndrome: Secondary | ICD-10-CM | POA: Diagnosis not present

## 2021-03-25 DIAGNOSIS — M5136 Other intervertebral disc degeneration, lumbar region: Secondary | ICD-10-CM | POA: Diagnosis not present

## 2021-03-25 DIAGNOSIS — M48062 Spinal stenosis, lumbar region with neurogenic claudication: Secondary | ICD-10-CM | POA: Diagnosis not present

## 2021-03-26 DIAGNOSIS — M48062 Spinal stenosis, lumbar region with neurogenic claudication: Secondary | ICD-10-CM | POA: Diagnosis not present

## 2021-03-26 DIAGNOSIS — M5136 Other intervertebral disc degeneration, lumbar region: Secondary | ICD-10-CM | POA: Diagnosis not present

## 2021-03-26 DIAGNOSIS — Z5181 Encounter for therapeutic drug level monitoring: Secondary | ICD-10-CM | POA: Diagnosis not present

## 2021-03-26 DIAGNOSIS — G894 Chronic pain syndrome: Secondary | ICD-10-CM | POA: Diagnosis not present

## 2021-03-27 DIAGNOSIS — R2689 Other abnormalities of gait and mobility: Secondary | ICD-10-CM | POA: Diagnosis not present

## 2021-03-27 DIAGNOSIS — M545 Low back pain, unspecified: Secondary | ICD-10-CM | POA: Diagnosis not present

## 2021-03-27 DIAGNOSIS — G8929 Other chronic pain: Secondary | ICD-10-CM | POA: Diagnosis not present

## 2021-03-29 ENCOUNTER — Ambulatory Visit: Payer: Medicare Other | Attending: Internal Medicine

## 2021-03-29 DIAGNOSIS — R2689 Other abnormalities of gait and mobility: Secondary | ICD-10-CM | POA: Diagnosis not present

## 2021-03-29 DIAGNOSIS — G8929 Other chronic pain: Secondary | ICD-10-CM | POA: Diagnosis not present

## 2021-03-29 DIAGNOSIS — Z23 Encounter for immunization: Secondary | ICD-10-CM

## 2021-03-29 DIAGNOSIS — M545 Low back pain, unspecified: Secondary | ICD-10-CM | POA: Diagnosis not present

## 2021-03-29 NOTE — Progress Notes (Signed)
   Covid-19 Vaccination Clinic  Name:  Mary Buck    MRN: [id number] DOB: 12/08/1935  03/29/2021  Mary Buck was observed post Covid-19 immunization for 15 minutes without incident. She was provided with Vaccine Information Sheet and instruction to access the V-Safe system.   Mary Buck was instructed to call 911 with any severe reactions post vaccine: Difficulty breathing  Swelling of face and throat  A fast heartbeat  A bad rash all over body  Dizziness and weakness   Immunizations Administered     Name Date Dose VIS Date Route   PFIZER Comrnaty(Gray TOP) Covid-19 Vaccine 03/29/2021 11:34 AM 0.3 mL 08/23/2020 Intramuscular   Manufacturer: [employer]   Lot: [id number]   [place]: [phone]

## 2021-04-01 DIAGNOSIS — R2689 Other abnormalities of gait and mobility: Secondary | ICD-10-CM | POA: Diagnosis not present

## 2021-04-01 DIAGNOSIS — M545 Low back pain, unspecified: Secondary | ICD-10-CM | POA: Diagnosis not present

## 2021-04-01 DIAGNOSIS — G8929 Other chronic pain: Secondary | ICD-10-CM | POA: Diagnosis not present

## 2021-04-02 ENCOUNTER — Encounter: Payer: Self-pay | Admitting: Family

## 2021-04-02 ENCOUNTER — Other Ambulatory Visit (HOSPITAL_BASED_OUTPATIENT_CLINIC_OR_DEPARTMENT_OTHER): Payer: Self-pay

## 2021-04-02 ENCOUNTER — Other Ambulatory Visit: Payer: Self-pay

## 2021-04-02 ENCOUNTER — Encounter: Payer: Medicare Other | Attending: Psychology | Admitting: Psychology

## 2021-04-02 ENCOUNTER — Ambulatory Visit: Payer: Medicare Other | Admitting: Psychology

## 2021-04-02 DIAGNOSIS — F341 Dysthymic disorder: Secondary | ICD-10-CM | POA: Diagnosis not present

## 2021-04-02 DIAGNOSIS — Z23 Encounter for immunization: Secondary | ICD-10-CM | POA: Diagnosis not present

## 2021-04-02 DIAGNOSIS — F411 Generalized anxiety disorder: Secondary | ICD-10-CM | POA: Insufficient documentation

## 2021-04-02 DIAGNOSIS — G4733 Obstructive sleep apnea (adult) (pediatric): Secondary | ICD-10-CM | POA: Diagnosis not present

## 2021-04-02 DIAGNOSIS — G8929 Other chronic pain: Secondary | ICD-10-CM | POA: Diagnosis not present

## 2021-04-02 DIAGNOSIS — G479 Sleep disorder, unspecified: Secondary | ICD-10-CM

## 2021-04-02 MED ORDER — COVID-19 MRNA VAC-TRIS(PFIZER) 30 MCG/0.3ML IM SUSP
INTRAMUSCULAR | 0 refills | Status: DC
Start: 2021-03-29 — End: 2021-04-11
  Filled 2021-04-02: qty 0.3, 1d supply, fill #0

## 2021-04-04 ENCOUNTER — Encounter: Payer: Self-pay | Admitting: Family Medicine

## 2021-04-04 ENCOUNTER — Encounter: Payer: Self-pay | Admitting: Neurology

## 2021-04-04 DIAGNOSIS — M545 Low back pain, unspecified: Secondary | ICD-10-CM | POA: Diagnosis not present

## 2021-04-04 DIAGNOSIS — R2689 Other abnormalities of gait and mobility: Secondary | ICD-10-CM | POA: Diagnosis not present

## 2021-04-04 DIAGNOSIS — G8929 Other chronic pain: Secondary | ICD-10-CM | POA: Diagnosis not present

## 2021-04-08 ENCOUNTER — Telehealth: Payer: Self-pay | Admitting: Neurology

## 2021-04-08 NOTE — Telephone Encounter (Signed)
Please see my chart message for reference.  Please offer follow-up appointment with one of our nurse practitioners to discuss alternative treatment options for sleep apnea, particularly a dental device.  I believe treating her moderate obstructive sleep apnea will be important especially in light of her cognitive complaints.

## 2021-04-09 NOTE — Telephone Encounter (Signed)
Pt returned phone call. Scheduled appt 04/10/21 at 2pm with Dr. Rexene Alberts.

## 2021-04-09 NOTE — Telephone Encounter (Signed)
Great, thanks

## 2021-04-09 NOTE — Telephone Encounter (Signed)
Called pt and LVM (ok per DPR) asking for call back to schedule an appointment to discuss alternative treatment options for sleep apnea. Left office number in message.

## 2021-04-10 ENCOUNTER — Ambulatory Visit (INDEPENDENT_AMBULATORY_CARE_PROVIDER_SITE_OTHER): Payer: Medicare Other | Admitting: Neurology

## 2021-04-10 ENCOUNTER — Encounter: Payer: Self-pay | Admitting: Neurology

## 2021-04-10 VITALS — BP 125/64 | HR 59 | Ht 65.0 in | Wt 162.0 lb

## 2021-04-10 DIAGNOSIS — G4733 Obstructive sleep apnea (adult) (pediatric): Secondary | ICD-10-CM | POA: Diagnosis not present

## 2021-04-10 NOTE — Patient Instructions (Signed)
It was nice to see you again today.  As discussed, we can resubmit your AutoPap order to another DME company.  If you change your mind about trying AutoPap therapy, we can certainly start you on a low pressure setting.  He will need a follow-up within 31 to 89 days after starting treatment at home. Please be mindful, that you would have to be compliant with the treatment. While your insurance may require that you use PAP at least 4 hours each night on 70% of the nights, I recommend, that you not skip any nights and use it throughout the night if you can. Getting used to PAP and staying with the treatment long term does take time and patience and discipline. Untreated obstructive sleep apnea when it is moderate to severe can have an adverse impact on cardiovascular health and raise her risk for heart disease, arrhythmias, hypertension, congestive heart failure, stroke and diabetes. Untreated obstructive sleep apnea causes sleep disruption, nonrestorative sleep, and sleep deprivation. This can have an impact on your day to day functioning and cause daytime sleepiness and impairment of cognitive function, memory loss, mood disturbance, and problems focussing. Using PAP regularly can improve these symptoms.  Since your home sleep test showed moderate OSA, I would not recommend, that we leave you untreated. We can send the autoPAP order to another DME of your choosing.   We can also look into a referral to ENT to discuss surgical treatment options or to dentistry and to discuss potential treatment options.  Realistically speaking, I would favor the treatment with a AutoPap machine for you.

## 2021-04-10 NOTE — Progress Notes (Signed)
[facility] [address], [place], [place] [address] [phone]  Date:  04/11/2021   Name:  Mary Buck   DOB:  1936-08-07   MRN:  [id number]  PCP:  Darreld Mclean, MD    Chief Complaint: Sinus Problem (Sinus pressure after using the CPAP machine. )   History of Present Illness:  Mary Buck is a 85 y.o. very pleasant female patient who presents with the following:  Patient seen today with concern of illness Most recent visit with myself was in March- history of significant skin cancer problems, bladder cancer, spinal stenosis, hypothyroidism, hyperlipidemia, peripheral neuropathy, neurogenic claudication, squamous cell cancer to the left leg status post or with radiation  She has been seeing physical therapy to work on her balance and strength Unfortunately she continues to have a lot of back pain despite RFA and steroid injections She does note that taking Tylenol oftentimes relieves her pain and makes her feel much better.  I advised her that she is fine to use Tylenol regularly if desired  She also did recently contact me with concern of nasal congestion that seem to start after she tried a couple of different CPAP machines She has been to see neurology-yesterday-to discuss sleep apnea They are still not sure if she will start on CPAP or not  She notes that she is still having a runny nose and she feels like she is more sensitive to sound  She notes that "my legs have gotten worse, it hurts when I move" Her balance, "dopey dizziness" seems to be worse She is not doing PT right now   Her partner also noted that her pupils seemed small recently Her neurologist felt that gabapentin might be causing small pupils We talked about her gabapentin.  She is not really sure if it is helping her.  I suggested that she might try tapering off this medication and see if it makes a difference  She is not blowing  much mucus from her nose but she is coughing mucus up.  She does have a mild cough  She has some sinus pressure but getting better  Her nose is running  No fever  They invited a friend to live with them but it is not going well- this is causing increased stress in their relationship and tension in the household   Patient Active Problem List   Diagnosis Date Noted   Thyroid disease    Heart murmur    GERD (gastroesophageal reflux disease)    Depression    Cataract    Cancer ([place])    Asthma    Allergy    IDA (iron deficiency anemia) 05/31/2020   Osteopenia 05/30/2019   Pedal edema 01/31/2018   Squamous cell carcinoma of skin of left lower limb, including hip 12/16/2017   Plantar fasciitis 07/07/2017   DDD (degenerative disc disease), lumbar 06/01/2017   Fibromyalgia 06/01/2017   Spinal stenosis at L4-L5 level 04/30/2017   Peripheral neuropathy 04/30/2017   Encounter for therapeutic drug monitoring 03/05/2017   Bladder cancer ([place]) 08/20/2015   Arthropathy of lumbar facet joint 07/27/2015   Spinal stenosis of cervical region 07/27/2015   Spondylolisthesis 07/27/2015   Hypothyroid 07/05/2015   Abdominal pain, acute, left upper quadrant 06/28/2015   Basal cell carcinoma, face 06/28/2015   Bilateral groin pain 06/28/2015   Chronic pain 06/28/2015   Fatigue 06/28/2015   Hyperlipidemia 06/28/2015   Bilateral hip pain  06/28/2015   Paresthesia of lower lip 06/28/2015   Pulmonary nodule 06/28/2015   Pustulosis palmaris et plantaris 06/28/2015   Ulcer of nose 06/28/2015   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   Mass of parotid gland 03/24/2014    Past Medical History:  Diagnosis Date   Allergy    Asthma    Cancer ([place])    Cataract    DDD (degenerative disc disease), lumbar 06/01/2017   Depression    GERD (gastroesophageal reflux disease)    Heart murmur    Hyperlipidemia    IDA (iron deficiency anemia) 05/31/2020   Peripheral neuropathy 04/30/2017   Thyroid disease     Past  Surgical History:  Procedure Laterality Date   bladder cancer  2013   CARPAL TUNNEL RELEASE  2015   CATARACT EXTRACTION Bilateral 2012   CHOLECYSTECTOMY     COLONOSCOPY WITH ESOPHAGOGASTRODUODENOSCOPY (EGD)  02/22/2018   [facility] Left 2015   benign   SPINE SURGERY  6/31/21   vertaflex   TONSILLECTOMY AND ADENOIDECTOMY  1943    Social History   Tobacco Use   Smoking status: Former    Types: Cigarettes    Quit date: 2015    Years since quitting: 7.5   Smokeless tobacco: Never  Vaping Use   Vaping Use: Never used  Substance Use Topics   Alcohol use: No   Drug use: No    Family History  Problem Relation Age of Onset   Macular degeneration Mother    Hyperlipidemia Father    Stroke Father    Cancer Brother        ? stomach   Prostate cancer Other        in brothers   Colon cancer Neg Hx     Allergies  Allergen Reactions   Contrast Media [Iodinated Diagnostic Agents] Anaphylaxis   Oxycodone Anaphylaxis    "my throat swells with any opioid"   Latex Other (See Comments)    Other reaction(s): blistering, redness    Mirtazapine Nausea Only   Acitretin Rash   Cephalexin Other (See Comments)    Unknown   Elemental Sulfur Other (See Comments)    unknown   Penicillins Other (See Comments)    Unknown   Sulfa Antibiotics Other (See Comments)    Unknown    Medication list has been reviewed and updated.  Current Outpatient Medications on File Prior to Visit  Medication Sig Dispense Refill   acitretin (SORIATANE) 10 MG capsule Take 10 mg by mouth as directed.     Cholecalciferol (VITAMIN D-3 PO) Take 1,000 Units by mouth daily.     gabapentin (NEURONTIN) 300 MG capsule Take 1 capsule (300 mg total) by mouth 3 (three) times daily. 90 capsule 4   lactobacillus acidophilus (BACID) TABS tablet Take 1 tablet by mouth daily.      lansoprazole (PREVACID) 30 MG capsule Take 1 capsule (30 mg total) by mouth 2 (two) times  daily before a meal. 180 capsule 3   levothyroxine (SYNTHROID) 88 MCG tablet TAKE 1 TABLET(88 MCG) BY MOUTH DAILY BEFORE BREAKFAST 90 tablet 1   misoprostol (CYTOTEC) 100 MCG tablet TAKE 2 TABLETS BY MOUTH AFTER BREAKFAST AND AFTER DINNER AS DIRECTED 120 tablet 0   simvastatin (ZOCOR) 20 MG tablet TAKE 1 TABLET(20 MG) BY MOUTH DAILY 90 tablet 3   traMADol (ULTRAM) 50 MG tablet Take 50-100 mg by mouth 3 (three) times daily as needed. Takes 50 mg at breakfast and lunch  or dinner then 100 mg at bedtime     No current facility-administered medications on file prior to visit.    Review of Systems:  As per HPI- otherwise negative.   Physical Examination: Vitals:   04/11/21 1434  BP: 134/70  Pulse: 65  Temp: 98.4 F (36.9 C)  SpO2: 95%   Vitals:   04/11/21 1434  Weight: 160 lb 6.4 oz (72.8 kg)  Height: '5\' 5"'$  (1.651 m)   Body mass index is 26.69 kg/m. Ideal Body Weight: Weight in (lb) to have BMI = 25: 149.9  GEN: no acute distress.  Mild overweight, looks well and her normal self  HEENT: Atraumatic, Normocephalic.  Bilateral TM wnl, oropharynx normal.  PEERL,EOMI. some inflammation of the nasal cavity Ears and Nose: No external deformity. CV: RRR, No M/G/R. No JVD. No thrill. No extra heart sounds. PULM: CTA B, no wheezes, crackles, rhonchi. No retractions. No resp. distress. No accessory muscle use. EXTR: No c/c/e PSYCH: Normally interactive. Conversant.    Assessment and Plan: Sinus congestion - Plan: azithromycin (ZITHROMAX) 250 MG tablet  Other polyneuropathy  Chronic bilateral low back pain without sciatica  Frailty - Plan: Ambulatory referral to Physical Therapy  Balance disorder - Plan: Ambulatory referral to Physical Therapy Seen today with a few concerns.  She has noted possible sinus discomfort since she used a couple of CPAP machines-she felt the pressure was really high Also, she has noted a cough.  I called in azithromycin as she is allergic to several  antibiotics and there is an interaction with doxycycline She will let me know if not improved in about 5 days  We discussed her back pain.  Okay to taper off gabapentin over the course of a couple of weeks, she can see if this is making any difference.  Continue tramadol as needed.  Also okay to use Tylenol  She would like to continue physical therapy, I placed a referral  This visit occurred during the SARS-CoV-2 public health emergency.  Safety protocols were in place, including screening questions prior to the visit, additional usage of staff PPE, and extensive cleaning of exam room while observing appropriate contact time as indicated for disinfecting solutions.   Signed Lamar Blinks, MD

## 2021-04-10 NOTE — Progress Notes (Addendum)
Subjective:    Patient ID: Mary Buck is a 85 y.o. female.  HPI    Interim history:  Mary Buck is an 85 year old right-handed woman with an underlying complex medical history of lumbar spinal stenosis, history of bladder cancer, cervical spinal stenosis and degenerative spine disease, hypothyroidism, obesity, gait d/o, fibromyalgia, history of neuropathy, history of squamous cell cancer with status post radiation treatment to the left leg as well as treatment with acitretin, who Presents for FU consultation of her sleep apnea.  The patient is unaccompanied today.  I last saw her on 11/04/2020, at which time we talked about her sleep test results.  She had not started AutoPap therapy at the time.  She was in behavioral therapy and counseling through neuropsychology.  She was also followed by pain management.  Today, 04/10/2021: She reports that she did not end up picking up her CPAP or AutoPap machine.  At the time of her set up, she was tried on different masks and air pressure seemed so high that she did not think she would tolerate it.  She had 1 time without actually getting the machine.  She also reports that she started having a runny nose that lasted for several days after she tried the machine during the pickup appointment.  She is still followed by pain management, she wakes up in the middle of the night with left leg pain.  She is on tramadol and gabapentin.  She is going to have to transfer care to a different neuropsychologist.   Previously (copied from previous notes for reference):      I saw her on 07/26/20, At which time she reported a several month history of short-term memory issues.  Her MMSE was 30 out of 30 at the time.  She was advised to proceed with additional evaluation in the form of brain MRI, sleep testing and neuropsychological evaluation.  Interim neuropsychological evaluation did not reveal any significant cognitive disorder.   She had a brain MRI without  contrast on 08/02/2020 and I reviewed the results:   IMPRESSION: This MRI of the brain without contrast shows the following: 1.   Mild stable generalized cortical atrophy 2.   T2/flair hyperintense foci predominantly in the deep white matter most consistent with moderate chronic microvascular ischemic change.  None of the foci appear to be acute.     Her home sleep test on 08/28/2020 which indicated moderate obstructive sleep apnea with an AHI of 26.4/h, O2 nadir of 77%.  She was advised to start AutoPap therapy at home.     03/17/19: (She) reports worsening balance problems for the past few months.  Thankfully she has not fallen.  She fell in December 2018.  She reports forgetfulness and daytime sleepiness.  She takes tramadol 50 mg 3 times daily and also takes gabapentin, 300 mg twice daily during the day and 600 mg at night.  She denies a family history of Parkinson's disease.  She has wondered if she has Parkinson's-like changes.  She snores according to her partner.  She has never had a sleep study. She had a brain MRI without contrast on 04/05/2018 through [facility] and I reviewed the results:  IMPRESSION: Atrophy and chronic microvascular ischemic change in the white matter. No acute abnormality. She had a cervical spine and lumbar spine MRI without contrast in October 2016 when she was in [place].  I was able to review the report she brought: Study date was 07/09/2015: She had disc protrusion at L2-3,  prominent disc bulge and spondylitic changes at L4-5 with moderate bilateral neuroforaminal narrowing and mild bilateral lateral recess stenosis, at L5-S1 she had diffuse disc bulge and bony spinal changes causing moderate to marked bilateral neural foraminal narrowing without canal stenosis.  She had a prior lumbar spine MRI in April 2012 which was reference in this report.  The cervical spine MRI showed mild to moderate spondylitic changes throughout the cervical spine in combination with  congenitally small spinal canal causing mild canal stenosis at C4-5 with moderate to market bilateral neural foraminal narrowing and borderline canal stenosis at C5-6 with market bilateral neural foramina narrowing.  She had no evidence of focal cord compression or abnormal cord signal at the time.  She follows with a pain management physician through [facility]. She is retired, she lives with her partner, she quit smoking in July 2015, she does not drink alcohol, she likes to drink caffeine in the form of coffee, about 5 to 6 cups/day.  She does not drink a whole lot of water, estimates that she drinks about 16 ounces per day.  She has radiating lower back pain to the left leg.  She has an area of numbness in the left chin area where she received radiation therapy.  Her Past Medical History Is Significant For: Past Medical History:  Diagnosis Date   Allergy    Asthma    Cancer ([place])    Cataract    DDD (degenerative disc disease), lumbar 06/01/2017   Depression    GERD (gastroesophageal reflux disease)    Heart murmur    Hyperlipidemia    IDA (iron deficiency anemia) 05/31/2020   Peripheral neuropathy 04/30/2017   Thyroid disease     Her Past Surgical History Is Significant For: Past Surgical History:  Procedure Laterality Date   bladder cancer  2013   CARPAL TUNNEL RELEASE  2015   CATARACT EXTRACTION Bilateral 2012   CHOLECYSTECTOMY     COLONOSCOPY WITH ESOPHAGOGASTRODUODENOSCOPY (EGD)  02/22/2018   [facility] Left 2015   benign   SPINE SURGERY  6/31/21   vertaflex   TONSILLECTOMY AND ADENOIDECTOMY  1943    Her Family History Is Significant For: Family History  Problem Relation Age of Onset   Macular degeneration Mother    Hyperlipidemia Father    Stroke Father    Cancer Brother        ? stomach   Prostate cancer Other        in brothers   Colon cancer Neg Hx     Her Social History Is Significant For: Social  History   Socioeconomic History   Marital status: Significant Other    Spouse name: Not on file   Number of children: 0   Years of education: 16   Highest education level: Not on file  Occupational History   Occupation: retired  Tobacco Use   Smoking status: Former    Types: Cigarettes    Quit date: 2015    Years since quitting: 7.5   Smokeless tobacco: Never  Vaping Use   Vaping Use: Never used  Substance and Sexual Activity   Alcohol use: No   Drug use: No   Sexual activity: Not on file  Other Topics Concern   Not on file  Social History Narrative   Not on file   Social Determinants of Health   Financial Resource Strain: Not on file  Food Insecurity: Not on file  Transportation Needs:  Not on file  Physical Activity: Not on file  Stress: Not on file  Social Connections: Not on file    Her Allergies Are:  Allergies  Allergen Reactions   Contrast Media [Iodinated Diagnostic Agents] Anaphylaxis   Oxycodone Anaphylaxis    "my throat swells with any opioid"   Latex Other (See Comments)    Other reaction(s): blistering, redness    Mirtazapine Nausea Only   Acitretin Rash   Cephalexin Other (See Comments)    Unknown   Elemental Sulfur Other (See Comments)    unknown   Penicillins Other (See Comments)    Unknown   Sulfa Antibiotics Other (See Comments)    Unknown  :   Her Current Medications Are:  Outpatient Encounter Medications as of 04/10/2021  Medication Sig   acitretin (SORIATANE) 10 MG capsule Take 10 mg by mouth as directed.   Cholecalciferol (VITAMIN D-3 PO) Take 1,000 Units by mouth daily.   COVID-19 mRNA Vac-TriS, Pfizer, SUSP injection Inject into the muscle.   gabapentin (NEURONTIN) 300 MG capsule Take 1 capsule (300 mg total) by mouth 3 (three) times daily.   lactobacillus acidophilus (BACID) TABS tablet Take 1 tablet by mouth daily.    lansoprazole (PREVACID) 30 MG capsule Take 1 capsule (30 mg total) by mouth 2 (two) times daily before a meal.    levothyroxine (SYNTHROID) 88 MCG tablet TAKE 1 TABLET(88 MCG) BY MOUTH DAILY BEFORE BREAKFAST   misoprostol (CYTOTEC) 100 MCG tablet TAKE 2 TABLETS BY MOUTH AFTER BREAKFAST AND AFTER DINNER AS DIRECTED   simvastatin (ZOCOR) 20 MG tablet TAKE 1 TABLET(20 MG) BY MOUTH DAILY   traMADol (ULTRAM) 50 MG tablet Take 50-100 mg by mouth 3 (three) times daily as needed. Takes 50 mg at breakfast and lunch or dinner then 100 mg at bedtime   No facility-administered encounter medications on file as of 04/10/2021.  :  Review of Systems:  Out of a complete 14 point review of systems, all are reviewed and negative with the exception of these symptoms as listed below:   Review of Systems  Neurological:        Pt presents today to discuss her sleep apnea. Pt did not take cpap home after her class at the DME. The pressure on the CPAP felt too high.   Objective:  Neurological Exam  Physical Exam Physical Examination:   Vitals:   04/10/21 1359  BP: 125/64  Pulse: (!) 59    General Examination: The patient is a very pleasant 85 y.o. female in no acute distress. She appears well-developed and well-nourished and well groomed.   HEENT: Normocephalic, atraumatic, pupils are equal, round and reactive to light. Tracking is preserved. Hearing is grossly intact. Face is symmetric with normal facial animation. Speech is clear without dysarthria, hypophonia or voice tremor.  No carotid bruits.  Airway examination reveals dentures in place, stable findings with regard to moderate airway crowding.  She has a mild anterior flexion of her neck, stable.   Chest: Clear to auscultation without wheezing, rhonchi or crackles noted.   Heart: S1+S2+0, regular and normal without murmurs, rubs or gallops noted.   Abdomen: Soft, non-tender and non-distended.   Extremities: There is no obvious edema.     Skin: Warm and dry without trophic changes noted.   Musculoskeletal: exam reveals arthritic changes in both hands,  stable.    Neurologically: Mental status: The patient is awake, alert and oriented in all 4 spheres. Her immediate and remote memory, attention, language skills and  fund of knowledge are appropriate. There is no evidence of aphasia, agnosia, apraxia or anomia. Speech is clear with normal prosody and enunciation. Thought process is linear. Mood is normal and affect is normal.    (On 07/26/2020: MMSE: 30/30, AFT: 13/min.)   Cranial nerves II - XII are as described above under HEENT exam.   Motor exam: Normal bulk, strength and tone is noted. There is no obvious tremor, fine motor skills are grossly intact.   Cerebellar testing: No dysmetria or intention tremor. There is no truncal or gait ataxia. Sensory exam: intact to light touch.   Assessment and Plan:    In summary, Suleyma Moser is a very pleasant 85 year old female with an underlying complex medical history of lumbar spinal stenosis, history of bladder cancer, cervical spinal stenosis and degenerative spine disease, hypothyroidism, balance problem, history of fall, obesity, fibromyalgia, history of neuropathy, history of squamous cell cancer with status post radiation treatment to the left leg as well as treatment with acitretin, cognitive complaints and sleep apnea, who presents for follow-up consultation of her OSA.  Her home sleep test from 08/27/2020 indicated moderate obstructive sleep apnea with an AHI of 26.4/h, O2 nadir 77%. Neuropsychological evaluation did not reveal any evidence of neurocognitive disorder.  Brain MRI from November 2021 showed stable findings, mild atrophy, moderate white matter changes.  She decided not to pick up an AutoPap machine.  Unfortunately, her alternative treatment options are limited.  She may not be a good candidate for dental treatment and we mutually agreed that a surgical evaluation is not favored.  Nevertheless, if she does wish to speak with an ENT surgeon and/or pursue evaluation for an oral  appliance, I would be happy to make a referral.  She is encouraged to think about AutoPap therapy, I could try to keep the pressure settings really low so she gets acclimated a little easier.  If she changes her mind regarding AutoPap therapy, she is encouraged to call our office.  She would like to think about it.  We will plan to follow-up in this office accordingly.  She is in the process of establishing with a new neuropsychologist.  I explained to her that moderate obstructive sleep apnea does carry potential long-term risks with regard to cardiovascular disease.  Also to help to treat her sleep apnea upon the point of symptom control, and that her chronic pain, her sleep quality, and her cognitive symptoms may improve after treatment. I answered all her questions today and she was in agreement with the plan.  I spent 20 minutes in total face-to-face time and in reviewing records during pre-charting, more than 50% of which was spent in counseling and coordination of care, reviewing test results, reviewing medications and treatment regimen and/or in discussing or reviewing the diagnosis of OSA, the prognosis and treatment options. Pertinent laboratory and imaging test results that were available during this visit with the patient were reviewed by me and considered in my medical decision making (see chart for details).   Addendum, 04/29/2021: I have placed an order for AutoPap therapy, as patient emailed via [facility] requesting to start AutoPap therapy.

## 2021-04-11 ENCOUNTER — Encounter: Payer: Self-pay | Admitting: Family Medicine

## 2021-04-11 ENCOUNTER — Ambulatory Visit (INDEPENDENT_AMBULATORY_CARE_PROVIDER_SITE_OTHER): Payer: Medicare Other | Admitting: Family Medicine

## 2021-04-11 ENCOUNTER — Other Ambulatory Visit: Payer: Self-pay

## 2021-04-11 VITALS — BP 134/70 | HR 65 | Temp 98.4°F | Ht 65.0 in | Wt 160.4 lb

## 2021-04-11 DIAGNOSIS — R0981 Nasal congestion: Secondary | ICD-10-CM

## 2021-04-11 DIAGNOSIS — M545 Low back pain, unspecified: Secondary | ICD-10-CM

## 2021-04-11 DIAGNOSIS — R54 Age-related physical debility: Secondary | ICD-10-CM | POA: Diagnosis not present

## 2021-04-11 DIAGNOSIS — G6289 Other specified polyneuropathies: Secondary | ICD-10-CM | POA: Diagnosis not present

## 2021-04-11 DIAGNOSIS — R2689 Other abnormalities of gait and mobility: Secondary | ICD-10-CM | POA: Diagnosis not present

## 2021-04-11 DIAGNOSIS — G8929 Other chronic pain: Secondary | ICD-10-CM | POA: Diagnosis not present

## 2021-04-11 MED ORDER — AZITHROMYCIN 250 MG PO TABS: ORAL_TABLET | ORAL | 0 refills | Status: AC

## 2021-04-11 NOTE — Patient Instructions (Addendum)
It was good to see you today- we will use azithomcyin for possible sinus and chest congestion If you would like to taper off the gabapentin that is ok- you can see if it makes any difference as far as your pain or other symptoms  You can taper off over the course of about 2 weeks.  If it turns out this was actually helping you, always ok to go back on it Ok to use tylenol as needed for pain

## 2021-04-12 ENCOUNTER — Encounter: Payer: Self-pay | Admitting: Family Medicine

## 2021-04-12 NOTE — Telephone Encounter (Signed)
I called patient to discuss.  She notes that she has felt "congested and wheezy" in her chest.  She also notes a little bit of a possible rash on her forehead She is not having significant shortness of breath or distress.  She was just not sure if the symptoms could be related to her azithromycin.  I advised her that this is possible, I really cannot know for sure.  In any case, we will have her not take azithromycin at this time  We are somewhat low antibiotic options for her issues allergic to penicillins, cephalosporins, and sulfa.  Also, doxycycline interacts with her oral chemotherapy she is taken for screening cell cancers.  She actually takes her oral chemo 3 days a week, Monday Wednesday and Friday.  If we needed to we could hold this temporarily and have her use doxycycline-there is an interaction between these drugs  She will let me know how she feels on Monday, if she feels antibiotics are necessary I can call in doxycycline at that time.  She will watch for any worsening or other concerns

## 2021-04-22 DIAGNOSIS — Z85828 Personal history of other malignant neoplasm of skin: Secondary | ICD-10-CM | POA: Diagnosis not present

## 2021-04-22 DIAGNOSIS — L570 Actinic keratosis: Secondary | ICD-10-CM | POA: Diagnosis not present

## 2021-04-22 DIAGNOSIS — L309 Dermatitis, unspecified: Secondary | ICD-10-CM | POA: Diagnosis not present

## 2021-04-22 DIAGNOSIS — L821 Other seborrheic keratosis: Secondary | ICD-10-CM | POA: Diagnosis not present

## 2021-04-22 DIAGNOSIS — L403 Pustulosis palmaris et plantaris: Secondary | ICD-10-CM | POA: Diagnosis not present

## 2021-04-24 ENCOUNTER — Encounter: Payer: Self-pay | Admitting: Psychology

## 2021-04-24 ENCOUNTER — Encounter: Payer: Medicare Other | Attending: Psychology | Admitting: Psychology

## 2021-04-24 ENCOUNTER — Other Ambulatory Visit: Payer: Self-pay

## 2021-04-24 DIAGNOSIS — G4733 Obstructive sleep apnea (adult) (pediatric): Secondary | ICD-10-CM | POA: Diagnosis not present

## 2021-04-24 DIAGNOSIS — F331 Major depressive disorder, recurrent, moderate: Secondary | ICD-10-CM | POA: Insufficient documentation

## 2021-04-24 DIAGNOSIS — F341 Dysthymic disorder: Secondary | ICD-10-CM

## 2021-04-24 DIAGNOSIS — F411 Generalized anxiety disorder: Secondary | ICD-10-CM

## 2021-04-24 DIAGNOSIS — G8929 Other chronic pain: Secondary | ICD-10-CM

## 2021-04-24 DIAGNOSIS — G479 Sleep disorder, unspecified: Secondary | ICD-10-CM

## 2021-04-24 NOTE — Progress Notes (Signed)
Neuropsychology Visit  Patient:  JAYLIA JACOBER   DOB: 1936/01/05  MR Number: [id number]  Location: [facility] [place], [place] [id number] [place] [address] Dept: [phone]  Date of Service: 04/02/2021  Start: 2 PM End: 3 PM  Today's visit was an in person visit was conducted in my outpatient clinic office.  The patient myself were present.  Duration of Service: 1 Hour  Provider/Observer:     Edgardo Roys PsyD  Chief Complaint:      Chief Complaint  Patient presents with   Anxiety   Depression   Memory Loss   Stress   Pain    Reason For Service:     Dagmar Houg is an 85 year old female who has a history of anxiety and depressive symptomatology going back to adolescence.  Patient has had issues with insomnia since age 63 that were started around the time when she had an encounter with a snake and stayed up all night worrying that she had been bitten with a venomous bite despite absence of physical signs or symptoms.  OCD and anxiety have persisted throughout life.  Patient had a recent neuropsychological evaluation due to concerns of cognitive changes but did quite well on almost all neuropsychological measures and memory issues are likely related to anxiety and normal age-related changes.  The patient has been followed by Dr. Darol Destine for psychotherapeutic interventions and after he left the practice she is following up with myself.  Treatment Interventions:  Cognitive/behavioral psychotherapeutic interventions or any issues related to generalized anxiety and coping and stress.  Participation Level:   Active  Participation Quality:  Appropriate      Behavioral Observation:  Well Groomed, Alert, and Appropriate.   Current Psychosocial Factors: The patient reports that there continue to be stressors around their house guest although the friend staying  with them is going to be leaving for about 10 days so the friend could go visit her mother who is ill.  The patient has been able to talk with his houseguest about the difficulties with her continuing to stay there and work on try to find ways to help the friend find another place to live.  Content of Session:   Reviewed current symptoms and worked on therapeutic interventions around issues of anxiety and depressive symptomatology and coping with various stressors in her life.  Effectiveness of Interventions: Report has begun to be established and the patient has been open and engaging throughout the therapeutic efforts and we are working on transitioning from previous psychologist.  Target Goals:   Today we worked on Radiographer, therapeutic around anxiety and OCD type symptoms and working on issues related to insomnia.  Patient has been diagnosed with obstructive sleep apnea but has not been able to tolerate a CPAP machine when she was initially in the fitting.  And will also be important to try to get the patient to give the CPAP device another effort as it may have a beneficial effect for her anxiety, memory and depressive symptomatology.  Goals Last Reviewed:   04/24/2021  Goals Addressed Today:    Today we worked on Adult nurse and establishing treatment goals.  Impression/Diagnosis:   Jett Stang. Fluke is an 85 year old female who has a history of anxiety and depressive symptomatology going back to adolescence.  Patient has had issues with insomnia since age 40 that were started around the time when she had an encounter with  a snake and stayed up all night worrying that she had been bitten with a venomous bite despite absence of physical signs or symptoms.  OCD and anxiety have persisted throughout life.  Patient had a recent neuropsychological evaluation due to concerns of cognitive changes but did quite well on almost all neuropsychological measures and memory issues are likely related to anxiety and  normal age-related changes.  The patient has been followed by Dr. Darol Destine for psychotherapeutic interventions and after he left the practice she is following up with myself.  Diagnosis:   Generalized anxiety disorder  Persistent depressive disorder with anxious distress, currently moderate  Other chronic pain  OSA (obstructive sleep apnea)  Sleep disturbance    [name], Psy.D. Clinical Psychologist Neuropsychologist

## 2021-04-24 NOTE — Progress Notes (Signed)
Neuropsychology Visit  Patient:  Mary Buck   DOB: 1936/08/02  MR Number: [id number]  Location: [facility] [place], [place] [id number] [place] [address] Dept: [phone]  Date of Service: 04/02/2021  Start: 2 PM End: 3 PM  Today's visit was an in person visit was conducted in my outpatient clinic office.  The patient myself were present.  Duration of Service: 1 Hour  Provider/Observer:     Edgardo Roys PsyD  Chief Complaint:      Chief Complaint  Patient presents with   Anxiety   Sleeping Problem   Stress   Pain    Reason For Service:     Mary Buck is an 85 year old female who has a history of anxiety and depressive symptomatology going back to adolescence.  Patient has had issues with insomnia since age 70 that were started around the time when she had an encounter with a snake and stayed up all night worrying that she had been bitten with a venomous bite despite absence of physical signs or symptoms.  OCD and anxiety have persisted throughout life.  Patient had a recent neuropsychological evaluation due to concerns of cognitive changes but did quite well on almost all neuropsychological measures and memory issues are likely related to anxiety and normal age-related changes.  The patient has been followed by Dr. Darol Destine for psychotherapeutic interventions and after he left the practice she is following up with myself.  Treatment Interventions:  Cognitive/behavioral psychotherapeutic interventions or any issues related to generalized anxiety and coping and stress.  Participation Level:   Active  Participation Quality:  Appropriate      Behavioral Observation:  Well Groomed, Alert, and Appropriate.   Current Psychosocial Factors: The patient reports that there has continued to be issues with anxiety but OCD type symptoms in the past are generally  well managed.  The patient reports that there is a major stressor with her and her partner as they have let a friend stay at their house who is essentially well out stayed her welcome.  There is increased stress within the household.  Content of Session:   Reviewed current symptoms and worked on therapeutic interventions around issues of anxiety and depressive symptomatology and coping with various stressors in her life.  Effectiveness of Interventions: Report has begun to be established and the patient has been open and engaging throughout the therapeutic efforts and we are working on transitioning from previous psychologist.  Target Goals:   Today we worked on Radiographer, therapeutic around anxiety and OCD type symptoms and working on issues related to insomnia.  Patient has been diagnosed with obstructive sleep apnea but has not been able to tolerate a CPAP machine when she was initially in the fitting.  And will also be important to try to get the patient to give the CPAP device another effort as it may have a beneficial effect for her anxiety, memory and depressive symptomatology.  Goals Last Reviewed:   04/02/2021  Goals Addressed Today:    Today we worked on Adult nurse and establishing treatment goals.  Impression/Diagnosis:   Mary Buck is an 85 year old female who has a history of anxiety and depressive symptomatology going back to adolescence.  Patient has had issues with insomnia since age 26 that were started around the time when she had an encounter with a snake and stayed up all night worrying that she had been bitten with  a venomous bite despite absence of physical signs or symptoms.  OCD and anxiety have persisted throughout life.  Patient had a recent neuropsychological evaluation due to concerns of cognitive changes but did quite well on almost all neuropsychological measures and memory issues are likely related to anxiety and normal age-related changes.  The patient has been followed  by Dr. Darol Destine for psychotherapeutic interventions and after he left the practice she is following up with myself.  Diagnosis:   Generalized anxiety disorder  Persistent depressive disorder with anxious distress, currently moderate  Other chronic pain  OSA (obstructive sleep apnea)  Sleep disturbance    [name], Psy.D. Clinical Psychologist Neuropsychologist

## 2021-04-25 ENCOUNTER — Ambulatory Visit: Payer: Medicare Other | Admitting: Psychology

## 2021-04-25 ENCOUNTER — Encounter: Payer: Self-pay | Admitting: Neurology

## 2021-04-29 ENCOUNTER — Inpatient Hospital Stay (HOSPITAL_BASED_OUTPATIENT_CLINIC_OR_DEPARTMENT_OTHER): Payer: Medicare Other | Admitting: Family

## 2021-04-29 ENCOUNTER — Inpatient Hospital Stay: Payer: Medicare Other | Attending: Hematology & Oncology

## 2021-04-29 ENCOUNTER — Other Ambulatory Visit: Payer: Self-pay

## 2021-04-29 ENCOUNTER — Other Ambulatory Visit: Payer: Self-pay | Admitting: Family

## 2021-04-29 ENCOUNTER — Telehealth: Payer: Self-pay | Admitting: *Deleted

## 2021-04-29 ENCOUNTER — Encounter: Payer: Self-pay | Admitting: Family

## 2021-04-29 ENCOUNTER — Telehealth: Payer: Self-pay | Admitting: Neurology

## 2021-04-29 VITALS — BP 146/77 | HR 52 | Temp 98.2°F | Resp 18 | Ht 65.0 in | Wt 160.4 lb

## 2021-04-29 DIAGNOSIS — Z79899 Other long term (current) drug therapy: Secondary | ICD-10-CM | POA: Insufficient documentation

## 2021-04-29 DIAGNOSIS — D649 Anemia, unspecified: Secondary | ICD-10-CM

## 2021-04-29 DIAGNOSIS — D508 Other iron deficiency anemias: Secondary | ICD-10-CM | POA: Diagnosis not present

## 2021-04-29 DIAGNOSIS — G4730 Sleep apnea, unspecified: Secondary | ICD-10-CM | POA: Insufficient documentation

## 2021-04-29 DIAGNOSIS — D509 Iron deficiency anemia, unspecified: Secondary | ICD-10-CM | POA: Diagnosis not present

## 2021-04-29 LAB — CBC WITH DIFFERENTIAL (CANCER CENTER ONLY)
Abs Immature Granulocytes: 0 10*3/uL (ref 0.00–0.07)
Basophils Absolute: 0.1 10*3/uL (ref 0.0–0.1)
Basophils Relative: 1 %
Eosinophils Absolute: 0.6 10*3/uL — ABNORMAL HIGH (ref 0.0–0.5)
Eosinophils Relative: 13 %
HCT: 37.5 % (ref 36.0–46.0)
Hemoglobin: 12.2 g/dL (ref 12.0–15.0)
Immature Granulocytes: 0 %
Lymphocytes Relative: 36 %
Lymphs Abs: 1.6 10*3/uL (ref 0.7–4.0)
MCH: 31.1 pg (ref 26.0–34.0)
MCHC: 32.5 g/dL (ref 30.0–36.0)
MCV: 95.7 fL (ref 80.0–100.0)
Monocytes Absolute: 0.5 10*3/uL (ref 0.1–1.0)
Monocytes Relative: 11 %
Neutro Abs: 1.8 10*3/uL (ref 1.7–7.7)
Neutrophils Relative %: 39 %
Platelet Count: 180 10*3/uL (ref 150–400)
RBC: 3.92 MIL/uL (ref 3.87–5.11)
RDW: 12.8 % (ref 11.5–15.5)
WBC Count: 4.6 10*3/uL (ref 4.0–10.5)
nRBC: 0 % (ref 0.0–0.2)

## 2021-04-29 LAB — RETICULOCYTES
Immature Retic Fract: 9.1 % (ref 2.3–15.9)
RBC.: 3.89 MIL/uL (ref 3.87–5.11)
Retic Count, Absolute: 43.6 10*3/uL (ref 19.0–186.0)
Retic Ct Pct: 1.1 % (ref 0.4–3.1)

## 2021-04-29 NOTE — Telephone Encounter (Signed)
Per 04/29/21 los - gave upcoming appointments - confirmed -print calendar

## 2021-04-29 NOTE — Addendum Note (Signed)
Addended by: Star Age on: 04/29/2021 12:53 PM   Modules accepted: Orders

## 2021-04-29 NOTE — Telephone Encounter (Signed)
Please see my chart message for reference.  I have placed an order for AutoPap therapy as an addendum to my office note in July.  Please send the DME order and advise patient to schedule a follow-up within 31 to 89 days once she starts treatment with AutoPap therapy at home.  Patient is requesting a different DME provider, as I understand.

## 2021-04-29 NOTE — Progress Notes (Signed)
Hematology and Oncology Follow Up Visit  Mary Buck [id number] Feb 25, 1936 85 y.o. 04/29/2021   Principle Diagnosis:  Iron deficiency anemia    Current Therapy:        IV iron as indicated    Interim History:  Mary Buck is here today for follow-up. She is having a hard time sleeping which leaves her feeling fatigued and napping throughout the day.  She states that she was diagnosed recently with mild to moderate sleep apnea but had some issues while trying to be fitted for a mask. She has reached back out to the clinic to have this rescheduled at a different facility.  She also notes intermittent pain under the left side of her rib cage for the last week or so. She is not sure if this is musculoskeletal.  No fever, chills, n/v, cough, rash, SOB, chest pain, palpitations, abdominal pain or changes in bowel or bladder habits.  No blood loss noted. No bruising or petechiae.  The neuropathy in her lower extremities is unchanged from baseline.  She denies any falls or syncope.  She takes Gabapentin which helps with her pain but does cause some mild dizziness at times.  She has maintained a good appetite and is doing her best to stay well hydrated. Her weight is stable at 160 lbs.   ECOG Performance Status: 1 - Symptomatic but completely ambulatory  Medications:  Allergies as of 04/29/2021       Reactions   Contrast Media [iodinated Diagnostic Agents] Anaphylaxis   Oxycodone Anaphylaxis   "my throat swells with any opioid"   Latex Other (See Comments)   Other reaction(s): blistering, redness    Mirtazapine Nausea Only   Acitretin Rash   Cephalexin Other (See Comments)   Unknown   Elemental Sulfur Other (See Comments)   unknown   Penicillins Other (See Comments)   Unknown   Sulfa Antibiotics Other (See Comments)   Unknown        Medication List        Accurate as of April 29, 2021 11:47 AM. If you have any questions, ask your nurse or doctor.          STOP  taking these medications    lactobacillus acidophilus Tabs tablet Stopped by: Laverna Peace, NP       TAKE these medications    acitretin 10 MG capsule Commonly known as: SORIATANE Take 10 mg by mouth daily before breakfast.   gabapentin 300 MG capsule Commonly known as: NEURONTIN Take 1 capsule (300 mg total) by mouth 3 (three) times daily.   lansoprazole 30 MG capsule Commonly known as: PREVACID Take 1 capsule (30 mg total) by mouth 2 (two) times daily before a meal.   levothyroxine 88 MCG tablet Commonly known as: SYNTHROID TAKE 1 TABLET(88 MCG) BY MOUTH DAILY BEFORE BREAKFAST   misoprostol 100 MCG tablet Commonly known as: CYTOTEC TAKE 2 TABLETS BY MOUTH AFTER BREAKFAST AND AFTER DINNER AS DIRECTED   simvastatin 20 MG tablet Commonly known as: ZOCOR TAKE 1 TABLET(20 MG) BY MOUTH DAILY   traMADol 50 MG tablet Commonly known as: ULTRAM Take 50-100 mg by mouth 3 (three) times daily as needed. Takes 50 mg at breakfast and lunch or dinner then 100 mg at bedtime   VITAMIN D-3 PO Take 1,000 Units by mouth daily.        Allergies:  Allergies  Allergen Reactions   Contrast Media [Iodinated Diagnostic Agents] Anaphylaxis   Oxycodone Anaphylaxis    "my throat  swells with any opioid"   Latex Other (See Comments)    Other reaction(s): blistering, redness    Mirtazapine Nausea Only   Acitretin Rash   Cephalexin Other (See Comments)    Unknown   Elemental Sulfur Other (See Comments)    unknown   Penicillins Other (See Comments)    Unknown   Sulfa Antibiotics Other (See Comments)    Unknown    Past Medical History, Surgical history, Social history, and Family History were reviewed and updated.  Review of Systems: All other 10 point review of systems is negative.   Physical Exam:  height is '5\' 5"'$  (1.651 m) and weight is 160 lb 6.4 oz (72.8 kg). Her oral temperature is 98.2 F (36.8 C). Her blood pressure is 146/77 (abnormal) and her pulse is 52  (abnormal). Her respiration is 18 and oxygen saturation is 100%.   Wt Readings from Last 3 Encounters:  04/29/21 160 lb 6.4 oz (72.8 kg)  04/11/21 160 lb 6.4 oz (72.8 kg)  04/10/21 162 lb (73.5 kg)    Ocular: Sclerae unicteric, pupils equal, round and reactive to light Ear-nose-throat: Oropharynx clear, dentition fair Lymphatic: No cervical or supraclavicular adenopathy Lungs no rales or rhonchi, good excursion bilaterally Heart regular rate and rhythm, no murmur appreciated Abd soft, nontender, positive bowel sounds MSK no focal spinal tenderness, no joint edema Neuro: non-focal, well-oriented, appropriate affect Breasts: Deferred   Lab Results  Component Value Date   WBC 4.6 04/29/2021   HGB 12.2 04/29/2021   HCT 37.5 04/29/2021   MCV 95.7 04/29/2021   PLT 180 04/29/2021   Lab Results  Component Value Date   FERRITIN 61 01/22/2021   IRON 77 01/22/2021   TIBC 224 (L) 01/22/2021   UIBC 147 01/22/2021   IRONPCTSAT 35 01/22/2021   Lab Results  Component Value Date   RETICCTPCT 1.1 04/29/2021   RBC 3.89 04/29/2021   No results found for: KPAFRELGTCHN, LAMBDASER, KAPLAMBRATIO No results found for: IGGSERUM, IGA, IGMSERUM No results found for: Ronnald Ramp, A1GS, A2GS, Tillman Sers, SPEI   Chemistry      Component Value Date/Time   NA 143 05/30/2020 1453   K 4.1 05/30/2020 1453   CL 105 05/30/2020 1453   CO2 33 (H) 05/30/2020 1453   BUN 15 05/30/2020 1453   CREATININE 0.92 05/30/2020 1453      Component Value Date/Time   CALCIUM 8.8 (L) 05/30/2020 1453   ALKPHOS 72 05/30/2020 1453   AST 15 05/30/2020 1453   ALT 10 05/30/2020 1453   BILITOT 0.4 05/30/2020 1453       Impression and Plan: Mary Buck is a very pleasant 85 yo female with history of iron deficiency anemia.  Iron studies are pending. We will replace if needed.  Follow-up in 6 months.  She can contact our office with any questions or concerns.   Laverna Peace,  NP 8/15/202211:47 AM

## 2021-04-30 LAB — IRON AND TIBC
Iron: 88 ug/dL (ref 41–142)
Saturation Ratios: 40 % (ref 21–57)
TIBC: 219 ug/dL — ABNORMAL LOW (ref 236–444)
UIBC: 131 ug/dL (ref 120–384)

## 2021-04-30 LAB — FERRITIN: Ferritin: 69 ng/mL (ref 11–307)

## 2021-04-30 NOTE — Telephone Encounter (Signed)
Message sent to patient via mychart in response to her previous message.

## 2021-05-01 ENCOUNTER — Encounter: Payer: Self-pay | Admitting: Family Medicine

## 2021-05-01 DIAGNOSIS — R194 Change in bowel habit: Secondary | ICD-10-CM

## 2021-05-01 DIAGNOSIS — R1012 Left upper quadrant pain: Secondary | ICD-10-CM

## 2021-05-01 NOTE — Telephone Encounter (Signed)
I faxed a referral cancellation request to [facility] and then faxed the orders and all supporting information to [facility]. Received a receipt of confirmation.

## 2021-05-01 NOTE — Telephone Encounter (Signed)
PAP order, insurance information, demographics, office note, sleep study results all faxed to [facility]. Received a receipt of confirmation.

## 2021-05-03 NOTE — Addendum Note (Signed)
Addended by: Lamar Blinks C on: 05/03/2021 01:25 PM   Modules accepted: Orders

## 2021-05-07 ENCOUNTER — Ambulatory Visit: Payer: Medicare Other | Admitting: Psychology

## 2021-05-08 ENCOUNTER — Ambulatory Visit (INDEPENDENT_AMBULATORY_CARE_PROVIDER_SITE_OTHER): Payer: Medicare Other | Admitting: Gastroenterology

## 2021-05-08 ENCOUNTER — Other Ambulatory Visit: Payer: Self-pay

## 2021-05-08 ENCOUNTER — Encounter: Payer: Self-pay | Admitting: Gastroenterology

## 2021-05-08 VITALS — BP 132/74 | HR 60 | Ht 65.0 in | Wt 160.5 lb

## 2021-05-08 DIAGNOSIS — K449 Diaphragmatic hernia without obstruction or gangrene: Secondary | ICD-10-CM

## 2021-05-08 DIAGNOSIS — K219 Gastro-esophageal reflux disease without esophagitis: Secondary | ICD-10-CM

## 2021-05-08 MED ORDER — MISOPROSTOL 100 MCG PO TABS
ORAL_TABLET | ORAL | 3 refills | Status: DC
Start: 2021-05-08 — End: 2021-08-20

## 2021-05-08 MED ORDER — FAMOTIDINE 20 MG PO TABS: 20.0000 mg | ORAL_TABLET | Freq: Every day | ORAL | 3 refills | Status: DC

## 2021-05-08 NOTE — Patient Instructions (Signed)
If you are age 83 or older, your body mass index should be between 23-30. Your Body mass index is 26.71 kg/m. If this is out of the aforementioned range listed, please consider follow up with your Primary Care Provider.  If you are age 28 or younger, your body mass index should be between 19-25. Your Body mass index is 26.71 kg/m. If this is out of the aformentioned range listed, please consider follow up with your Primary Care Provider.   __________________________________________________________  The [facility] providers would like to encourage you to use [facility] to communicate with providers for non-urgent requests or questions.  Due to long hold times on the telephone, sending your provider a message by [facility] may be a faster and more efficient way to get a response.  Please allow 48 business hours for a response.  Please remember that this is for non-urgent requests.   We have sent the following medications to your pharmacy for you to pick up at your convenience: Pepcid Misoprostol  Please call in 12 months to schedule an follow up appointment at [phone].  Please call with any questions or concerns.  It was a pleasure to see you today!  Vito Cirigliano, D.O.

## 2021-05-08 NOTE — Progress Notes (Signed)
Chief Complaint:    GERD  GI History: 85 y.o. female with a history of chest pain, spinal stenosis with neurogenic claudication (follows at pain management), bladder CA, hypothyroidism, HLD, peripheral neuropathy, initially seen in the GI clinic in 04/2020 for evaluation of loose, nonbloody stools (but not watery diarrhea) w/ urgency.  Symptoms started following spine surgery approximately 5 weeks earlier, and was improving with dietary modifications alone at the time of initial appointment.   History of IBS-D.  Previously followed with a Copywriter, advertising in [place] for a longstanding history of loose, irregular stools, but described as non-bothersome to patient.  Essentially well-controlled with dietary modification alone.  Last colonoscopy 2019 in [place].   Longstanding history of reflux, well controlled with Prevacid BID and misoprostol (started in 2019 for gastritis on EGD), which she has been taking for years prescribed by her previous GI.  Worse with coffee and overeating.  Regurgitation with forward flexion. EGD 2019 in [place].   Endoscopic History -EGD (02/22/2018, Dr. Devoria Glassing, MD): Abnormal esophageal motility compatible with presbyesophagus,  normal esophageal mucosa, acute gastritis at GE junction, small sliding 2 cm hiatal hernia, bile induced gastritis with fluid in antrum. (path: non-H pylori gastritis) -Colonoscopy (02/22/2018, Dr. Devoria Glassing, MD): Normal  HPI:     Patient is a 85 y.o. female presenting to the Gastroenterology Clinic for follow-up.  Last seen by me on 12/04/2020 for evaluation of reflux and dyspepsia.    When she made this appt, it was for upper abdominal pain, which has since resolved. Possibly MSK pain from moving furniture.   Otherwise, reflux well controlled with Prevacid and Pepcid at night.   IBS-D well-controlled with dietary modifications and high fiber diet.  Follows in the Hematology clinic for IDA, responsive to IV iron.  Labs  from 04/29/2021 demonstrate normal H/H and iron indices.  Does have a CT abdomen/pelvis ordered by Dr. Lorelei Pont pending.  Review of systems:     No chest pain, no SOB, no fevers, no urinary sx   Past Medical History:  Diagnosis Date  . Allergy   . Asthma   . Cancer ([place])   . Cataract   . DDD (degenerative disc disease), lumbar 06/01/2017  . Depression   . GERD (gastroesophageal reflux disease)   . Heart murmur   . Hyperlipidemia   . IDA (iron deficiency anemia) 05/31/2020  . Peripheral neuropathy 04/30/2017  . Thyroid disease     Patient's surgical history, family medical history, social history, medications and allergies were all reviewed in Epic    Current Outpatient Medications  Medication Sig Dispense Refill  . acitretin (SORIATANE) 10 MG capsule Take 10 mg by mouth daily before breakfast.    . Cholecalciferol (VITAMIN D-3 PO) Take 1,000 Units by mouth daily.    Marland Kitchen gabapentin (NEURONTIN) 300 MG capsule Take 1 capsule (300 mg total) by mouth 3 (three) times daily. 90 capsule 4  . lansoprazole (PREVACID) 30 MG capsule Take 1 capsule (30 mg total) by mouth 2 (two) times daily before a meal. 180 capsule 3  . levothyroxine (SYNTHROID) 88 MCG tablet TAKE 1 TABLET(88 MCG) BY MOUTH DAILY BEFORE BREAKFAST 90 tablet 1  . misoprostol (CYTOTEC) 100 MCG tablet TAKE 2 TABLETS BY MOUTH AFTER BREAKFAST AND AFTER DINNER AS DIRECTED 120 tablet 0  . simvastatin (ZOCOR) 20 MG tablet TAKE 1 TABLET(20 MG) BY MOUTH DAILY 90 tablet 3  . traMADol (ULTRAM) 50 MG tablet Take 50-100 mg by mouth 3 (three) times daily as needed. Takes 50  mg at breakfast and lunch or dinner then 100 mg at bedtime     No current facility-administered medications for this visit.    Physical Exam:     BP 132/74   Pulse 60   Ht '5\' 5"'$  (1.651 m)   Wt 160 lb 8 oz (72.8 kg)   SpO2 97%   BMI 26.71 kg/m   GENERAL:  Pleasant female in NAD PSYCH: : Cooperative, normal affect NEURO: Alert and oriented x 3, walks with cane  assist   IMPRESSION and PLAN:    1) GERD 2) Nonulcer dyspepsia - Symptoms well controlled on current therapy - Refill placed today - Placed Rx for Pepcid qhs since she has been using OTC - Continue antireflux lifestyle/dietary modifications  3) Upper abdominal pain - Resolved  4) IBS - Well-controlled on current conservative management   RTC in 12 months or sooner prn             Dominic Pea Tarrin Menn ,DO, FACG 05/08/2021, 9:17 AM

## 2021-05-13 DIAGNOSIS — G894 Chronic pain syndrome: Secondary | ICD-10-CM | POA: Diagnosis not present

## 2021-05-13 DIAGNOSIS — M5489 Other dorsalgia: Secondary | ICD-10-CM | POA: Diagnosis not present

## 2021-05-13 DIAGNOSIS — M48062 Spinal stenosis, lumbar region with neurogenic claudication: Secondary | ICD-10-CM | POA: Diagnosis not present

## 2021-05-14 ENCOUNTER — Ambulatory Visit: Payer: Medicare Other | Admitting: Gastroenterology

## 2021-05-17 ENCOUNTER — Telehealth: Payer: Self-pay | Admitting: Family Medicine

## 2021-05-17 DIAGNOSIS — R2689 Other abnormalities of gait and mobility: Secondary | ICD-10-CM

## 2021-05-17 NOTE — Telephone Encounter (Signed)
Pt. Has called in and requested a referral for PT for her back and legs.  Location number: [phone]

## 2021-05-21 ENCOUNTER — Ambulatory Visit: Payer: Medicare Other | Admitting: Psychology

## 2021-05-27 ENCOUNTER — Encounter: Payer: Self-pay | Admitting: Family

## 2021-05-27 ENCOUNTER — Other Ambulatory Visit (HOSPITAL_BASED_OUTPATIENT_CLINIC_OR_DEPARTMENT_OTHER): Payer: Self-pay

## 2021-06-02 ENCOUNTER — Other Ambulatory Visit: Payer: Self-pay | Admitting: Family Medicine

## 2021-06-04 DIAGNOSIS — G894 Chronic pain syndrome: Secondary | ICD-10-CM | POA: Diagnosis not present

## 2021-06-04 DIAGNOSIS — M5136 Other intervertebral disc degeneration, lumbar region: Secondary | ICD-10-CM | POA: Diagnosis not present

## 2021-06-04 DIAGNOSIS — M48062 Spinal stenosis, lumbar region with neurogenic claudication: Secondary | ICD-10-CM | POA: Diagnosis not present

## 2021-06-06 ENCOUNTER — Other Ambulatory Visit (HOSPITAL_BASED_OUTPATIENT_CLINIC_OR_DEPARTMENT_OTHER): Payer: Self-pay | Admitting: Family Medicine

## 2021-06-06 DIAGNOSIS — Z1231 Encounter for screening mammogram for malignant neoplasm of breast: Secondary | ICD-10-CM

## 2021-06-12 DIAGNOSIS — R2689 Other abnormalities of gait and mobility: Secondary | ICD-10-CM | POA: Diagnosis not present

## 2021-06-13 ENCOUNTER — Telehealth: Payer: Self-pay | Admitting: Neurology

## 2021-06-13 NOTE — Telephone Encounter (Signed)
Call patient and left voicemail to schedule Initial CPAP appt.  DME: [facility] Ph: [phone] Fax: [phone] Equipment issued: AutoPAP on 06/13/21 Pt to be scheduled between: 07/13/21-09/11/21

## 2021-06-14 NOTE — Progress Notes (Addendum)
[facility] [place], [place], [place] [address] [phone]  Date:  06/17/2021   Name:  Mary Buck   DOB:  10-11-1935   MRN:  [id number]  PCP:  Darreld Mclean, MD    Chief Complaint: 6 month follow up (Concerns/ questions: none/Flu shot today: pt will weight a little longer/)   History of Present Illness:  Mary Buck is a 85 y.o. very pleasant female patient who presents with the following:  Patient seen today for periodic follow-up Most recent visit with myself was in July- history of significant skin cancer problems, bladder cancer, spinal stenosis, hypothyroidism, hyperlipidemia, peripheral neuropathy, neurogenic claudication, squamous cell cancer to the left leg status post or with radiation  Last visit she continued to have a lot of back pain, relieved by Tylenol.  Advised her okay to use Tylenol on a regular basis We also made a referral back to physical therapy to work on her She is getting an epidural injection for her back tomorrow  She got her CPAP machine which worked for a day and then seemed to break - they were able to fix it for her today and she is looking forward to using it because it helped her have more energy the one time she was able to use it   Flu shot COVID bivalent  Patient Active Problem List   Diagnosis Date Noted   Thyroid disease    Heart murmur    GERD (gastroesophageal reflux disease)    Depression    Cataract    Cancer ([place])    Asthma    Allergy    IDA (iron deficiency anemia) 05/31/2020   Osteopenia 05/30/2019   Pedal edema 01/31/2018   Squamous cell carcinoma of skin of left lower limb, including hip 12/16/2017   Plantar fasciitis 07/07/2017   DDD (degenerative disc disease), lumbar 06/01/2017   Fibromyalgia 06/01/2017   Spinal stenosis at L4-L5 level 04/30/2017   Peripheral neuropathy 04/30/2017   Encounter for therapeutic drug monitoring 03/05/2017    Bladder cancer ([place]) 08/20/2015   Arthropathy of lumbar facet joint 07/27/2015   Spinal stenosis of cervical region 07/27/2015   Spondylolisthesis 07/27/2015   Hypothyroid 07/05/2015   Abdominal pain, acute, left upper quadrant 06/28/2015   Basal cell carcinoma, face 06/28/2015   Bilateral groin pain 06/28/2015   Chronic pain 06/28/2015   Fatigue 06/28/2015   Hyperlipidemia 06/28/2015   Bilateral hip pain 06/28/2015   Paresthesia of lower lip 06/28/2015   Pulmonary nodule 06/28/2015   Pustulosis palmaris et plantaris 06/28/2015   Ulcer of nose 06/28/2015   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   Mass of parotid gland 03/24/2014    Past Medical History:  Diagnosis Date   Allergy    Asthma    Cancer ([place])    Cataract    DDD (degenerative disc disease), lumbar 06/01/2017   Depression    GERD (gastroesophageal reflux disease)    Heart murmur    Hyperlipidemia    IDA (iron deficiency anemia) 05/31/2020   Peripheral neuropathy 04/30/2017   Thyroid disease     Past Surgical History:  Procedure Laterality Date   bladder cancer  2013   CARPAL TUNNEL RELEASE  2015   CATARACT EXTRACTION Bilateral 2012   CHOLECYSTECTOMY     COLONOSCOPY WITH ESOPHAGOGASTRODUODENOSCOPY (EGD)  02/22/2018   [facility] Left 2015   benign   SPINE SURGERY  6/31/21   vertaflex   TONSILLECTOMY AND ADENOIDECTOMY  1943    Social History   Tobacco Use   Smoking status: Former    Types: Cigarettes    Quit date: 2015    Years since quitting: 7.7   Smokeless tobacco: Never  Vaping Use   Vaping Use: Never used  Substance Use Topics   Alcohol use: No   Drug use: No    Family History  Problem Relation Age of Onset   Macular degeneration Mother    Hyperlipidemia Father    Stroke Father    Cancer Brother        ? stomach   Prostate cancer Other        in brothers   Colon cancer Neg Hx     Allergies  Allergen Reactions   Contrast Media [Iodinated  Diagnostic Agents] Anaphylaxis   Oxycodone Anaphylaxis    "my throat swells with any opioid"   Latex Other (See Comments)    Other reaction(s): blistering, redness    Mirtazapine Nausea Only   Acitretin Rash   Cephalexin Other (See Comments)    Unknown   Elemental Sulfur Other (See Comments)    unknown   Penicillins Other (See Comments)    Unknown   Sulfa Antibiotics Other (See Comments)    Unknown    Medication list has been reviewed and updated.  Current Outpatient Medications on File Prior to Visit  Medication Sig Dispense Refill   acitretin (SORIATANE) 10 MG capsule Take 10 mg by mouth daily before breakfast.     Cholecalciferol (VITAMIN D-3 PO) Take 1,000 Units by mouth daily.     famotidine (PEPCID) 20 MG tablet Take 1 tablet (20 mg total) by mouth at bedtime. 90 tablet 3   gabapentin (NEURONTIN) 300 MG capsule Take 1 capsule (300 mg total) by mouth 3 (three) times daily. 90 capsule 4   lansoprazole (PREVACID) 30 MG capsule Take 1 capsule (30 mg total) by mouth 2 (two) times daily before a meal. 180 capsule 3   levothyroxine (SYNTHROID) 88 MCG tablet TAKE 1 TABLET(88 MCG) BY MOUTH DAILY BEFORE AND BREAKFAST 90 tablet 1   misoprostol (CYTOTEC) 100 MCG tablet TAKE 2 TABLETS BY MOUTH AFTER BREAKFAST AND AFTER DINNER AS DIRECTED 120 tablet 3   simvastatin (ZOCOR) 20 MG tablet TAKE 1 TABLET(20 MG) BY MOUTH DAILY 90 tablet 3   traMADol (ULTRAM) 50 MG tablet Take 50-100 mg by mouth 3 (three) times daily as needed. Takes 50 mg at breakfast and lunch or dinner then 100 mg at bedtime     No current facility-administered medications on file prior to visit.    Review of Systems:  As per HPI- otherwise negative.   Physical Examination: Vitals:   06/17/21 1329  BP: 118/60  Pulse: 67  Resp: 18  Temp: 98.2 F (36.8 C)  SpO2: 97%   Vitals:   06/17/21 1329  Weight: 161 lb (73 kg)  Height: 5\' 5"  (1.651 m)   Body mass index is 26.79 kg/m. Ideal Body Weight: Weight in (lb) to  have BMI = 25: 149.9  GEN: no acute distress. Minimal overweight- looks well  HEENT: Atraumatic, Normocephalic.  Ears and Nose: No external deformity. CV: RRR, No M/G/R. No JVD. No thrill. No extra heart sounds. PULM: CTA B, no wheezes, crackles, rhonchi. No retractions. No resp. distress. No accessory muscle use. ABD: S, NT, ND, +BS. No rebound. No HSM. EXTR: No c/c/e PSYCH: Normally interactive. Conversant.    Assessment and  Plan: Acquired hypothyroidism - Plan: TSH  Frailty  Other polyneuropathy  Balance disorder  Hyperlipidemia, unspecified hyperlipidemia type - Plan: Comprehensive metabolic panel, Lipid panel  OSA on CPAP  Seen today for follow-up.  We will check her labs as above- Will plan further follow- up pending labs.  She is getting good results with her CPAP machine, is looking forward to it being repaired and back in use She is continuing to do physical therapy with good results Plan for 63-month visit assuming all is well Recommend flu and COVID vaccines-she plans to do later this season  This visit occurred during the SARS-CoV-2 public health emergency.  Safety protocols were in place, including screening questions prior to the visit, additional usage of staff PPE, and extensive cleaning of exam room while observing appropriate contact time as indicated for disinfecting solutions.   Signed Lamar Blinks, MD  Received her labs as below, message to patient Results for orders placed or performed in visit on 06/17/21  Comprehensive metabolic panel  Result Value Ref Range   Sodium 143 135 - 145 mEq/L   Potassium 4.6 3.5 - 5.1 mEq/L   Chloride 104 96 - 112 mEq/L   CO2 31 19 - 32 mEq/L   Glucose, Bld 99 70 - 99 mg/dL   BUN 13 6 - 23 mg/dL   Creatinine, Ser 0.92 0.40 - 1.20 mg/dL   Total Bilirubin 0.4 0.2 - 1.2 mg/dL   Alkaline Phosphatase 80 39 - 117 U/L   AST 17 0 - 37 U/L   ALT 12 0 - 35 U/L   Total Protein 6.5 6.0 - 8.3 g/dL   Albumin 3.8 3.5 - 5.2  g/dL   GFR 57.88 (L) >60.00 mL/min   Calcium 9.0 8.4 - 10.5 mg/dL  Lipid panel  Result Value Ref Range   Cholesterol 154 0 - 200 mg/dL   Triglycerides 113.0 0.0 - 149.0 mg/dL   HDL 53.90 >39.00 mg/dL   VLDL 22.6 0.0 - 40.0 mg/dL   LDL Cholesterol 77 0 - 99 mg/dL   Total CHOL/HDL Ratio 3    NonHDL 99.61   TSH  Result Value Ref Range   TSH 1.74 0.35 - 5.50 uIU/mL

## 2021-06-14 NOTE — Telephone Encounter (Signed)
Contacted pt to schedule Initial CPAP. Scheduled 07/31/21 at 1:15pm. Informed patient to bring machine and power cord.

## 2021-06-17 ENCOUNTER — Ambulatory Visit (INDEPENDENT_AMBULATORY_CARE_PROVIDER_SITE_OTHER): Payer: Medicare Other | Admitting: Family Medicine

## 2021-06-17 ENCOUNTER — Encounter: Payer: Self-pay | Admitting: Family Medicine

## 2021-06-17 ENCOUNTER — Other Ambulatory Visit: Payer: Self-pay

## 2021-06-17 VITALS — BP 118/60 | HR 67 | Temp 98.2°F | Resp 18 | Ht 65.0 in | Wt 161.0 lb

## 2021-06-17 DIAGNOSIS — E039 Hypothyroidism, unspecified: Secondary | ICD-10-CM

## 2021-06-17 DIAGNOSIS — R2689 Other abnormalities of gait and mobility: Secondary | ICD-10-CM

## 2021-06-17 DIAGNOSIS — Z9989 Dependence on other enabling machines and devices: Secondary | ICD-10-CM

## 2021-06-17 DIAGNOSIS — R54 Age-related physical debility: Secondary | ICD-10-CM | POA: Diagnosis not present

## 2021-06-17 DIAGNOSIS — E785 Hyperlipidemia, unspecified: Secondary | ICD-10-CM | POA: Diagnosis not present

## 2021-06-17 DIAGNOSIS — G4733 Obstructive sleep apnea (adult) (pediatric): Secondary | ICD-10-CM

## 2021-06-17 DIAGNOSIS — G6289 Other specified polyneuropathies: Secondary | ICD-10-CM | POA: Diagnosis not present

## 2021-06-17 LAB — COMPREHENSIVE METABOLIC PANEL
ALT: 12 U/L (ref 0–35)
AST: 17 U/L (ref 0–37)
Albumin: 3.8 g/dL (ref 3.5–5.2)
Alkaline Phosphatase: 80 U/L (ref 39–117)
BUN: 13 mg/dL (ref 6–23)
CO2: 31 mEq/L (ref 19–32)
Calcium: 9 mg/dL (ref 8.4–10.5)
Chloride: 104 mEq/L (ref 96–112)
Creatinine, Ser: 0.92 mg/dL (ref 0.40–1.20)
GFR: 57.88 mL/min — ABNORMAL LOW (ref >60.00)
Glucose, Bld: 99 mg/dL (ref 70–99)
Potassium: 4.6 mEq/L (ref 3.5–5.1)
Sodium: 143 mEq/L (ref 135–145)
Total Bilirubin: 0.4 mg/dL (ref 0.2–1.2)
Total Protein: 6.5 g/dL (ref 6.0–8.3)

## 2021-06-17 LAB — LIPID PANEL
Cholesterol: 154 mg/dL (ref 0–200)
HDL: 53.9 mg/dL (ref >39.00)
LDL Cholesterol: 77 mg/dL (ref 0–99)
NonHDL: 99.61
Total CHOL/HDL Ratio: 3
Triglycerides: 113 mg/dL (ref 0.0–149.0)
VLDL: 22.6 mg/dL (ref 0.0–40.0)

## 2021-06-17 LAB — TSH: TSH: 1.74 u[IU]/mL (ref 0.35–5.50)

## 2021-06-17 NOTE — Patient Instructions (Signed)
Please get the flu shot and the new covid booster at your convenience this fall-  I will be in touch with your labs asap  If all is well please see me in about 6 months

## 2021-06-18 DIAGNOSIS — G629 Polyneuropathy, unspecified: Secondary | ICD-10-CM | POA: Diagnosis not present

## 2021-06-18 DIAGNOSIS — M48061 Spinal stenosis, lumbar region without neurogenic claudication: Secondary | ICD-10-CM | POA: Diagnosis not present

## 2021-06-18 DIAGNOSIS — M5416 Radiculopathy, lumbar region: Secondary | ICD-10-CM | POA: Diagnosis not present

## 2021-06-24 DIAGNOSIS — C679 Malignant neoplasm of bladder, unspecified: Secondary | ICD-10-CM | POA: Diagnosis not present

## 2021-06-25 DIAGNOSIS — H35373 Puckering of macula, bilateral: Secondary | ICD-10-CM | POA: Diagnosis not present

## 2021-06-25 DIAGNOSIS — H3589 Other specified retinal disorders: Secondary | ICD-10-CM | POA: Diagnosis not present

## 2021-06-25 DIAGNOSIS — H5203 Hypermetropia, bilateral: Secondary | ICD-10-CM | POA: Diagnosis not present

## 2021-06-25 DIAGNOSIS — H43813 Vitreous degeneration, bilateral: Secondary | ICD-10-CM | POA: Diagnosis not present

## 2021-06-25 DIAGNOSIS — H52203 Unspecified astigmatism, bilateral: Secondary | ICD-10-CM | POA: Diagnosis not present

## 2021-06-25 DIAGNOSIS — H04123 Dry eye syndrome of bilateral lacrimal glands: Secondary | ICD-10-CM | POA: Diagnosis not present

## 2021-06-25 DIAGNOSIS — H18523 Epithelial (juvenile) corneal dystrophy, bilateral: Secondary | ICD-10-CM | POA: Diagnosis not present

## 2021-06-25 DIAGNOSIS — H524 Presbyopia: Secondary | ICD-10-CM | POA: Diagnosis not present

## 2021-06-26 ENCOUNTER — Encounter: Payer: Medicare Other | Attending: Psychology | Admitting: Psychology

## 2021-06-26 ENCOUNTER — Other Ambulatory Visit: Payer: Self-pay

## 2021-06-26 DIAGNOSIS — G8929 Other chronic pain: Secondary | ICD-10-CM | POA: Diagnosis not present

## 2021-06-26 DIAGNOSIS — G4733 Obstructive sleep apnea (adult) (pediatric): Secondary | ICD-10-CM | POA: Insufficient documentation

## 2021-06-26 DIAGNOSIS — G479 Sleep disorder, unspecified: Secondary | ICD-10-CM | POA: Insufficient documentation

## 2021-06-26 DIAGNOSIS — F341 Dysthymic disorder: Secondary | ICD-10-CM | POA: Insufficient documentation

## 2021-06-26 DIAGNOSIS — F411 Generalized anxiety disorder: Secondary | ICD-10-CM | POA: Diagnosis not present

## 2021-06-27 ENCOUNTER — Encounter: Payer: Self-pay | Admitting: Psychology

## 2021-06-27 ENCOUNTER — Encounter: Payer: Self-pay | Admitting: Family

## 2021-06-27 ENCOUNTER — Ambulatory Visit: Payer: Medicare Other | Attending: Internal Medicine

## 2021-06-27 ENCOUNTER — Other Ambulatory Visit (HOSPITAL_BASED_OUTPATIENT_CLINIC_OR_DEPARTMENT_OTHER): Payer: Self-pay

## 2021-06-27 DIAGNOSIS — Z23 Encounter for immunization: Secondary | ICD-10-CM

## 2021-06-27 MED ORDER — INFLUENZA VAC A&B SA ADJ QUAD 0.5 ML IM PRSY
PREFILLED_SYRINGE | INTRAMUSCULAR | 0 refills | Status: DC
  Filled 2021-06-27: qty 0.5, 1d supply, fill #0
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
## 2021-06-27 NOTE — Progress Notes (Signed)
   Covid-19 Vaccination Clinic  Name:  Mary Buck    MRN: [id number] DOB: 05-23-1936  06/27/2021  Ms. Pillars was observed post Covid-19 immunization for 15 minutes without incident. She was provided with Vaccine Information Sheet and instruction to access the V-Safe system.   Ms. Aggarwal was instructed to call 911 with any severe reactions post vaccine: Difficulty breathing  Swelling of face and throat  A fast heartbeat  A bad rash all over body  Dizziness and weakness

## 2021-07-02 ENCOUNTER — Ambulatory Visit (HOSPITAL_BASED_OUTPATIENT_CLINIC_OR_DEPARTMENT_OTHER)
Admission: RE | Admit: 2021-07-02 | Discharge: 2021-07-02 | Disposition: A | Discharge status: Home / Self Care | Payer: Medicare Other | Source: Ambulatory Visit | Attending: Family Medicine | Admitting: Family Medicine

## 2021-07-02 ENCOUNTER — Other Ambulatory Visit: Payer: Self-pay

## 2021-07-02 ENCOUNTER — Encounter (HOSPITAL_BASED_OUTPATIENT_CLINIC_OR_DEPARTMENT_OTHER): Payer: Self-pay

## 2021-07-02 DIAGNOSIS — Z1231 Encounter for screening mammogram for malignant neoplasm of breast: Secondary | ICD-10-CM

## 2021-07-03 DIAGNOSIS — R2689 Other abnormalities of gait and mobility: Secondary | ICD-10-CM | POA: Diagnosis not present

## 2021-07-05 DIAGNOSIS — R2689 Other abnormalities of gait and mobility: Secondary | ICD-10-CM | POA: Diagnosis not present

## 2021-07-09 DIAGNOSIS — R2689 Other abnormalities of gait and mobility: Secondary | ICD-10-CM | POA: Diagnosis not present

## 2021-07-09 IMAGING — MG DIGITAL SCREENING BILAT W/ TOMO W/ CAD
6 of 12 series · 6 of 36 positions shown · non-contrast
Comparison: Previous exam(s).

CLINICAL DATA: Screening.

EXAM:
DIGITAL SCREENING BILATERAL MAMMOGRAM WITH TOMO AND CAD

[R CC synth-2D]
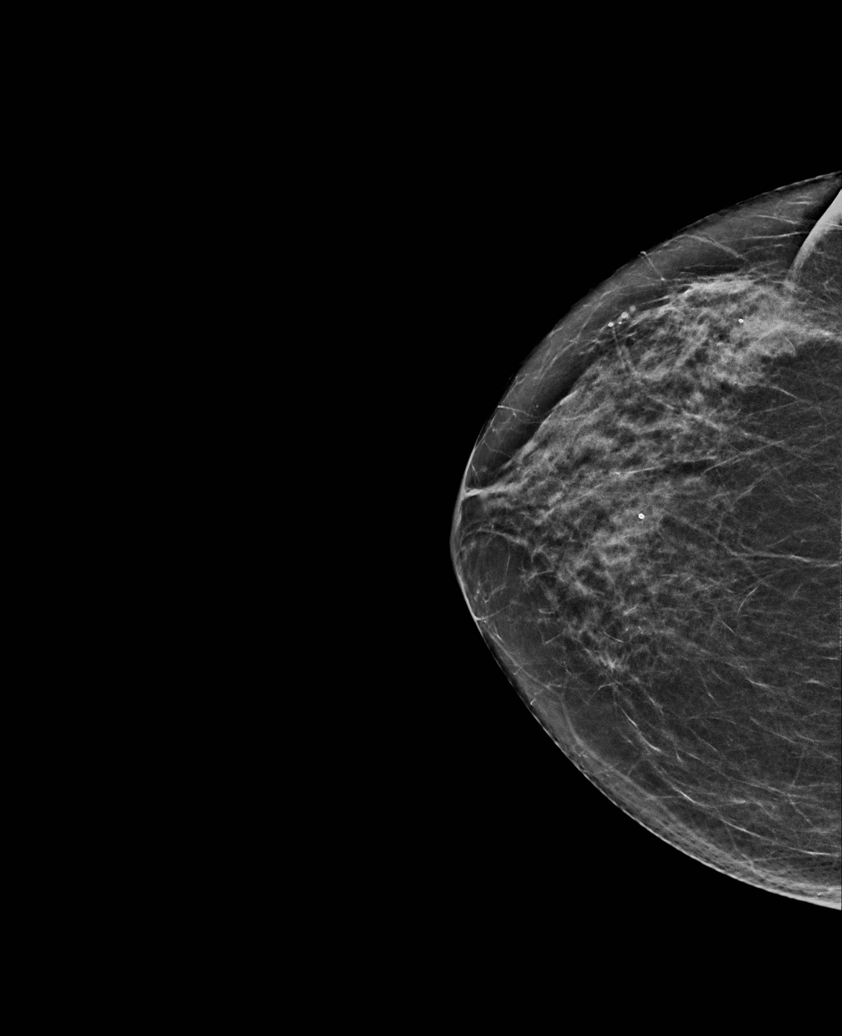

[L MLO synth-2D (1 of 2)]
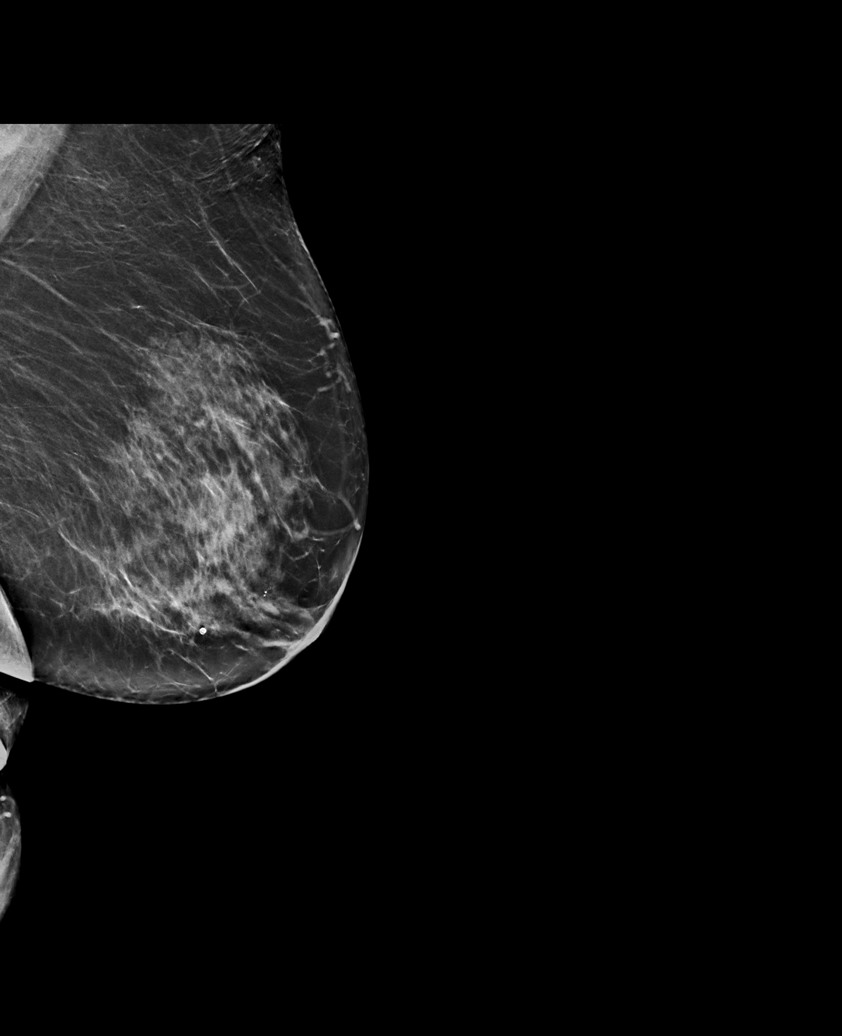

[R XCCL synth-2D]
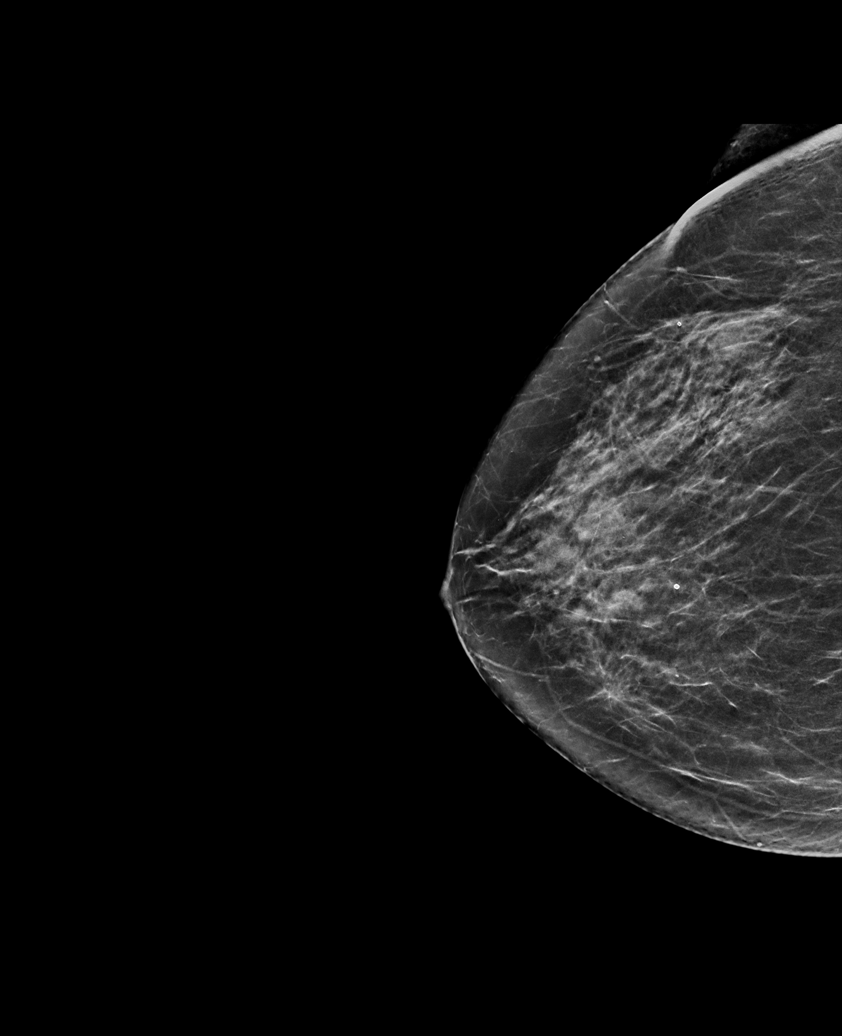

[L MLO synth-2D (2 of 2)]
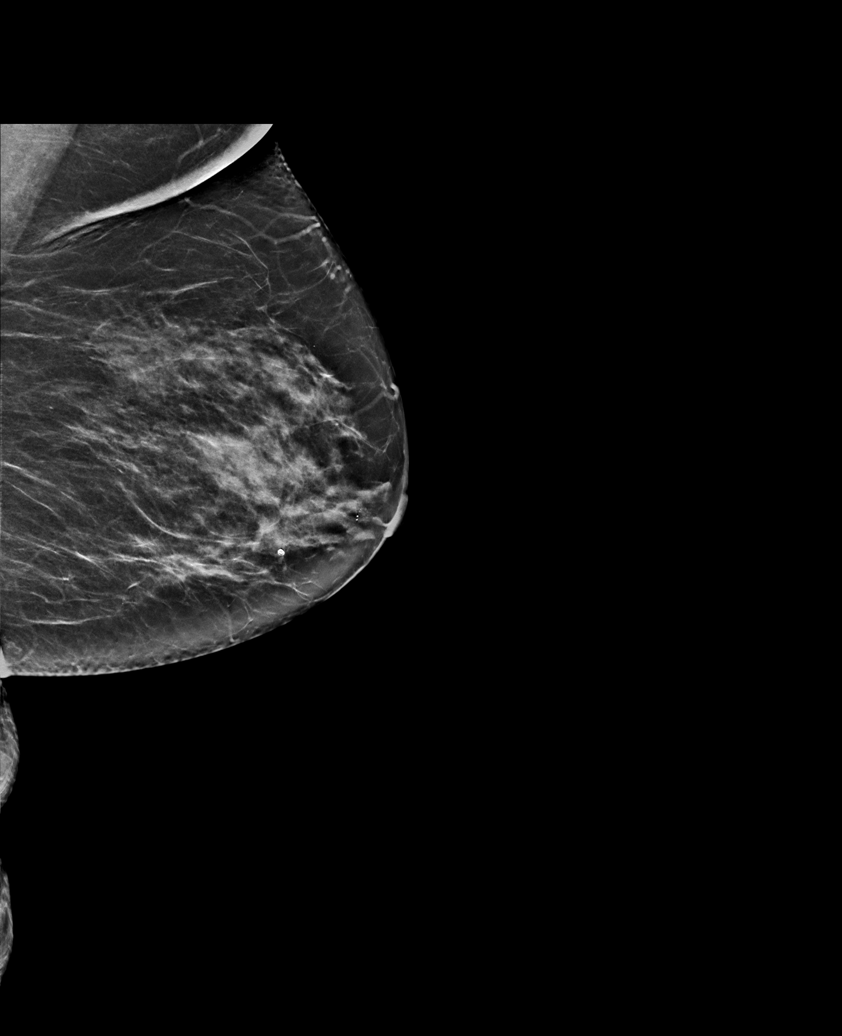

[R MLO synth-2D]
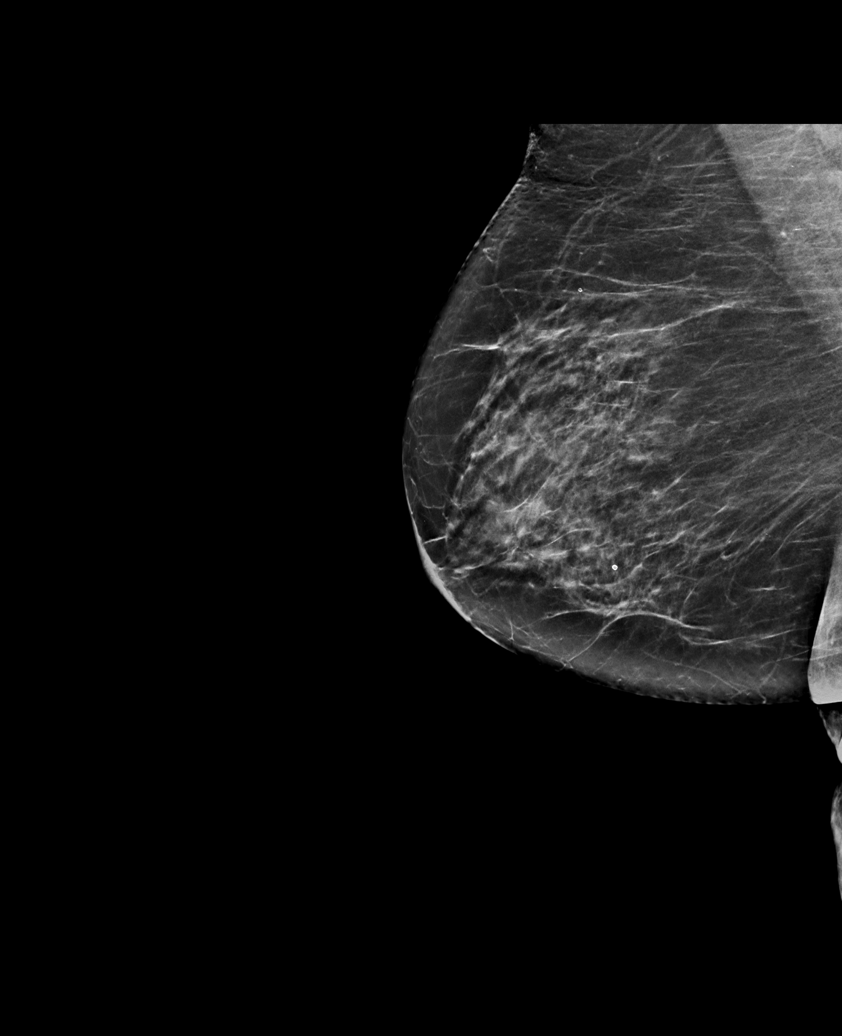

[L CC synth-2D]
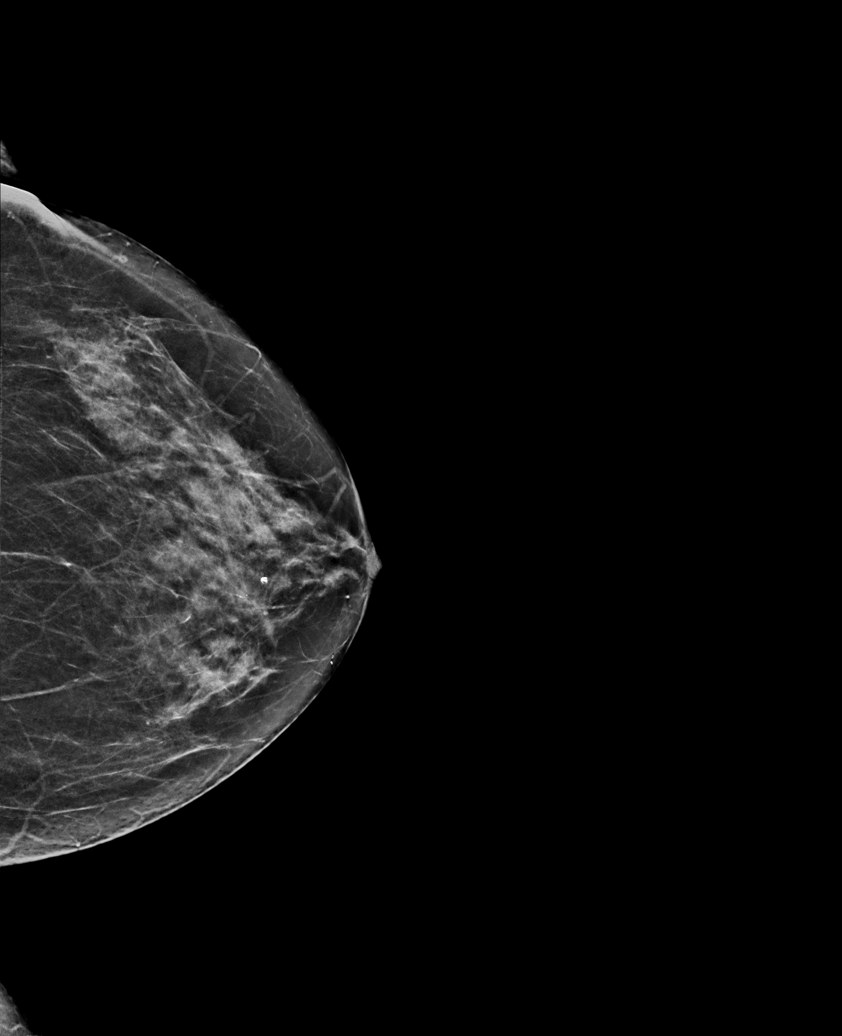

[6 of 36 positions shown; findings below may reference images not displayed]

ACR Breast Density Category c: The breast tissue is heterogeneously
dense, which may obscure small masses.
FINDINGS: There are no findings suspicious for malignancy. Images were
processed with CAD.
IMPRESSION: No mammographic evidence of malignancy. A result letter of this
screening mammogram will be mailed directly to the patient.

RECOMMENDATION:
Screening mammogram in one year. (Code:[id number])

BI-RADS CATEGORY  1: Negative.

## 2021-07-11 DIAGNOSIS — R2689 Other abnormalities of gait and mobility: Secondary | ICD-10-CM | POA: Diagnosis not present

## 2021-07-15 DIAGNOSIS — R2689 Other abnormalities of gait and mobility: Secondary | ICD-10-CM | POA: Diagnosis not present

## 2021-07-15 DIAGNOSIS — Z20822 Contact with and (suspected) exposure to covid-19: Secondary | ICD-10-CM | POA: Diagnosis not present

## 2021-07-18 DIAGNOSIS — R2689 Other abnormalities of gait and mobility: Secondary | ICD-10-CM | POA: Diagnosis not present

## 2021-07-19 ENCOUNTER — Other Ambulatory Visit (HOSPITAL_BASED_OUTPATIENT_CLINIC_OR_DEPARTMENT_OTHER): Payer: Self-pay

## 2021-07-19 DIAGNOSIS — Z23 Encounter for immunization: Secondary | ICD-10-CM | POA: Diagnosis not present

## 2021-07-19 MED ORDER — PFIZER COVID-19 VAC BIVALENT 30 MCG/0.3ML IM SUSP
INTRAMUSCULAR | 0 refills | Status: DC
  Filled 2021-07-19: qty 0.3, 1d supply, fill #0

## 2021-07-22 DIAGNOSIS — L308 Other specified dermatitis: Secondary | ICD-10-CM | POA: Diagnosis not present

## 2021-07-22 DIAGNOSIS — Z79899 Other long term (current) drug therapy: Secondary | ICD-10-CM | POA: Diagnosis not present

## 2021-07-22 DIAGNOSIS — L400 Psoriasis vulgaris: Secondary | ICD-10-CM | POA: Diagnosis not present

## 2021-07-22 DIAGNOSIS — Z85828 Personal history of other malignant neoplasm of skin: Secondary | ICD-10-CM | POA: Diagnosis not present

## 2021-07-25 ENCOUNTER — Other Ambulatory Visit: Payer: Self-pay | Admitting: Family Medicine

## 2021-07-29 ENCOUNTER — Telehealth: Payer: Self-pay | Admitting: Family Medicine

## 2021-07-29 DIAGNOSIS — R2689 Other abnormalities of gait and mobility: Secondary | ICD-10-CM | POA: Diagnosis not present

## 2021-07-29 NOTE — Telephone Encounter (Signed)
Pt is scheduled 11/15 @ 2:20 for AWV. Pt would like appointment in person. Please advise.

## 2021-07-30 ENCOUNTER — Other Ambulatory Visit: Payer: Self-pay

## 2021-07-30 ENCOUNTER — Ambulatory Visit (INDEPENDENT_AMBULATORY_CARE_PROVIDER_SITE_OTHER): Payer: Medicare Other

## 2021-07-30 VITALS — BP 150/78 | HR 64 | Temp 98.8°F | Resp 16 | Ht 65.0 in | Wt 166.0 lb

## 2021-07-30 DIAGNOSIS — Z0000 Encounter for general adult medical examination without abnormal findings: Secondary | ICD-10-CM

## 2021-07-30 NOTE — Progress Notes (Signed)
Subjective:   Mary Buck is a 85 y.o. female who presents for Medicare Annual (Subsequent) preventive examination.  Review of Systems     Cardiac Risk Factors include: advanced age (>13men, >46 women);dyslipidemia     Objective:    Today's Vitals   07/30/21 1419 07/30/21 1423  BP: (!) 150/78   Pulse: 64   Resp: 16   Temp: 98.8 F (37.1 C)   TempSrc: Oral   SpO2: 98%   Weight: 166 lb (75.3 kg)   Height: 5\' 5"  (1.651 m)   PainSc:  5    Body mass index is 27.62 kg/m.  Advanced Directives 07/30/2021 04/29/2021 10/25/2020 05/30/2020 03/28/2020 11/22/2019 04/13/2019  Does Patient Have a Medical Advance Directive? Yes Yes Yes Yes Yes No Yes  Type of Paramedic of [place];Living will [place];Living will [place];Living will [place];Living will [place];Living will - -  Does patient want to make changes to medical advance directive? - No - Patient declined - No - Patient declined No - Patient declined - No - Patient declined  Copy of [place] in Chart? - No - copy requested No - copy requested No - copy requested No - copy requested - -  Would patient like information on creating a medical advance directive? - - - - - - -    Current Medications (verified) Outpatient Encounter Medications as of 07/30/2021  Medication Sig   acitretin (SORIATANE) 10 MG capsule Take 10 mg by mouth daily before breakfast.   Cholecalciferol (VITAMIN D-3 PO) Take 1,000 Units by mouth daily.   famotidine (PEPCID) 20 MG tablet Take 1 tablet (20 mg total) by mouth at bedtime.   gabapentin (NEURONTIN) 300 MG capsule Take 1 capsule (300 mg total) by mouth 3 (three) times daily.   lansoprazole (PREVACID) 30 MG capsule Take 1 capsule (30 mg total) by mouth 2 (two) times daily before a meal.   levothyroxine (SYNTHROID) 88 MCG tablet TAKE 1 TABLET(88 MCG) BY MOUTH DAILY BEFORE AND  BREAKFAST   misoprostol (CYTOTEC) 100 MCG tablet TAKE 2 TABLETS BY MOUTH AFTER BREAKFAST AND AFTER DINNER AS DIRECTED   simvastatin (ZOCOR) 20 MG tablet TAKE 1 TABLET(20 MG) BY MOUTH DAILY   traMADol (ULTRAM) 50 MG tablet Take 50-100 mg by mouth 3 (three) times daily as needed. Takes 50 mg at breakfast and lunch or dinner then 100 mg at bedtime   COVID-19 mRNA bivalent vaccine, Pfizer, (PFIZER COVID-19 VAC BIVALENT) injection Inject into the muscle. (Patient not taking: Reported on 07/30/2021)   influenza vaccine adjuvanted (FLUAD) 0.5 ML injection Inject into the muscle. (Patient not taking: Reported on 07/30/2021)   No facility-administered encounter medications on file as of 07/30/2021.    Allergies (verified) Contrast media [iodinated diagnostic agents], Oxycodone, Latex, Mirtazapine, Acitretin, Cephalexin, Elemental sulfur, Penicillins, and Sulfa antibiotics   History: Past Medical History:  Diagnosis Date   Allergy    Asthma    Cancer ([place])    Cataract    DDD (degenerative disc disease), lumbar 06/01/2017   Depression    GERD (gastroesophageal reflux disease)    Heart murmur    Hyperlipidemia    IDA (iron deficiency anemia) 05/31/2020   Peripheral neuropathy 04/30/2017   Thyroid disease    Past Surgical History:  Procedure Laterality Date   bladder cancer  2013   CARPAL TUNNEL RELEASE  2015   CATARACT EXTRACTION Bilateral 2012   CHOLECYSTECTOMY  COLONOSCOPY WITH ESOPHAGOGASTRODUODENOSCOPY (EGD)  02/22/2018   [facility] Left 2015   benign   SPINE SURGERY  6/31/21   vertaflex   TONSILLECTOMY AND ADENOIDECTOMY  1943   Family History  Problem Relation Age of Onset   Macular degeneration Mother    Hyperlipidemia Father    Stroke Father    Cancer Brother        ? stomach   Prostate cancer Other        in brothers   Colon cancer Neg Hx    Social History   Socioeconomic History   Marital status: Significant  Other    Spouse name: Not on file   Number of children: 0   Years of education: 16   Highest education level: Not on file  Occupational History   Occupation: retired  Tobacco Use   Smoking status: Former    Types: Cigarettes    Quit date: 2015    Years since quitting: 7.8   Smokeless tobacco: Never  Vaping Use   Vaping Use: Never used  Substance and Sexual Activity   Alcohol use: No   Drug use: No   Sexual activity: Not on file  Other Topics Concern   Not on file  Social History Narrative   Not on file   Social Determinants of Health   Financial Resource Strain: Low Risk    Difficulty of Paying Living Expenses: Not hard at all  Food Insecurity: No Food Insecurity   Worried About Charity fundraiser in the Last Year: Never true   Arboriculturist in the Last Year: Never true  Transportation Needs: No Transportation Needs   Lack of Transportation (Medical): No   Lack of Transportation (Non-Medical): No  Physical Activity: Insufficiently Active   Days of Exercise per Week: 7 days   Minutes of Exercise per Session: 20 min  Stress: Stress Concern Present   Feeling of Stress : To some extent  Social Connections: Moderately Isolated   Frequency of Communication with Friends and Family: Once a week   Frequency of Social Gatherings with Friends and Family: Once a week   Attends Religious Services: More than 4 times per year   Active Member of [employer] or Organizations: No   Attends Music therapist: Never   Marital Status: Living with partner    Tobacco Counseling Counseling given: Not Answered   Clinical Intake:  Pre-visit preparation completed: Yes  Pain : 0-10 Pain Score: 5  Pain Type: Chronic pain Pain Location: Back (and leg) Pain Onset: More than a month ago Pain Frequency: Constant     BMI - recorded: 27.62 Nutritional Status: BMI 25 -29 Overweight Nutritional Risks: None Diabetes: No  How often do you need to have someone help you when you  read instructions, pamphlets, or other written materials from your doctor or pharmacy?: 1 - Never  Diabetic?No  Interpreter Needed?: No  Information entered by :: Caroleen Hamman LPN   Activities of Daily Living In your present state of health, do you have any difficulty performing the following activities: 07/30/2021  Hearing? N  Vision? N  Difficulty concentrating or making decisions? Y  Comment occasionally forgets names or words  Walking or climbing stairs? N  Dressing or bathing? N  Doing errands, shopping? N  Preparing Food and eating ? N  Using the Toilet? N  In the past six months, have you accidently leaked urine? N  Do you  have problems with loss of bowel control? N  Managing your Medications? N  Managing your Finances? N  Housekeeping or managing your Housekeeping? N  Some recent data might be hidden    Patient Care Team: Copland, Gay Filler, MD as PCP - General (Family Medicine) Berniece Salines, DO as PCP - Cardiology (Cardiology)  Indicate any recent Medical Services you may have received from other than [facility] providers in the past year (date may be approximate).     Assessment:   This is a routine wellness examination for Mary Buck.  Hearing/Vision screen Hearing Screening - Comments:: C/o mild hearig loss Vision Screening - Comments:: Last eye exam-Dr. Baldomero Lamy months ago  Dietary issues and exercise activities discussed: Current Exercise Habits: Home exercise routine, Type of exercise: walking, Time (Minutes): 20, Frequency (Times/Week): 7, Weekly Exercise (Minutes/Week): 140, Intensity: Mild, Exercise limited by: orthopedic condition(s)   Goals Addressed             This Visit's Progress    LIFESTYLE - DECREASE FALLS RISK   On track      Depression Screen PHQ 2/9 Scores 07/30/2021 04/11/2021 03/28/2020 04/30/2017 04/30/2017  PHQ - 2 Score 1 5 0 - 0  PHQ- 9 Score - 16 - - -  Exception Documentation - - - Medical reason Patient refusal    Fall  Risk Fall Risk  07/30/2021 04/11/2021 03/28/2020 04/18/2019 05/26/2018  Falls in the past year? 0 0 1 0 No  Comment - - - Emmi Telephone Survey: data to providers prior to load -  Number falls in past yr: 0 0 0 - -  Injury with Fall? 0 0 1 - -  Risk for fall due to : - No Fall Risks - - -  Follow up - Falls evaluation completed Education provided;Falls prevention discussed - -    FALL RISK PREVENTION PERTAINING TO THE HOME:  Any stairs in or around the home? Yes  If so, are there any without handrails? No  Home free of loose throw rugs in walkways, pet beds, electrical cords, etc? Yes  Adequate lighting in your home to reduce risk of falls? Yes   ASSISTIVE DEVICES UTILIZED TO PREVENT FALLS:  Life alert? No  Use of a cane, walker or w/c? Yes  Grab bars in the bathroom? Yes  Shower chair or bench in shower? No  Elevated toilet seat or a handicapped toilet? No   TIMED UP AND GO:  Was the test performed? Yes .  Length of time to ambulate 10 feet: 12 sec.   Gait steady and fast with assistive device  Cognitive Function:Normal cognitive status assessed by direct observation by this Nurse Health Advisor. No abnormalities found.   MMSE - Mini Mental State Exam 07/26/2020  Orientation to time 5  Orientation to Place 5  Registration 3  Attention/ Calculation 5  Recall 3  Language- name 2 objects 2  Language- repeat 1  Language- follow 3 step command 3  Language- read & follow direction 1  Write a sentence 1  Copy design 1  Total score 30     6CIT Screen 03/28/2020  What Year? 0 points  What month? 0 points  What time? 0 points  Count back from 20 0 points  Months in reverse 0 points  Repeat phrase 0 points  Total Score 0    Immunizations Immunization History  Administered Date(s) Administered   Fluad Quad(high Dose 65+) 06/13/2020, 06/27/2021   Influenza Split 06/15/2009, 05/29/2011, 06/14/2013   Influenza, High Dose  Seasonal PF 05/21/2010, 06/30/2014, 06/14/2017    Influenza, Seasonal, Injecte, Preservative Fre 05/21/2010   Influenza,inj,Quad PF,6+ Mos 06/18/2015   Influenza,inj,quad, With Preservative 06/30/2014   Influenza-Unspecified 06/15/2009, 05/29/2011, 10/21/2012, 06/14/2013, 06/18/2015, 06/13/2020   PFIZER Comirnaty(Gray Top)Covid-19 Tri-Sucrose Vaccine 03/29/2021   PFIZER(Purple Top)SARS-COV-2 Vaccination 10/20/2019, 11/10/2019   Pfizer Covid-19 Vaccine Bivalent Booster 76yrs & up 06/27/2021   Pneumococcal Conjugate-13 08/27/2004, 10/05/2014   Pneumococcal Polysaccharide-23 08/27/2004   Pneumococcal-Unspecified 08/27/2004   Td 05/08/2020   Tdap 12/13/2009   Zoster, Live 09/21/2013    TDAP status: Up to date  Flu Vaccine status: Up to date  Pneumococcal vaccine status: Up to date  Covid-19 vaccine status: Completed vaccines  Qualifies for Shingles Vaccine? Yes   Zostavax completed Yes   Shingrix Completed?: No.    Education has been provided regarding the importance of this vaccine. Patient has been advised to call insurance company to determine out of pocket expense if they have not yet received this vaccine. Advised may also receive vaccine at local pharmacy or Health Dept. Verbalized acceptance and understanding.  Screening Tests Health Maintenance  Topic Date Due   Zoster Vaccines- Shingrix (1 of 2) Never done   Pneumonia Vaccine 53+ Years old (2 - PPSV23 if available, else PCV20) 10/06/2015   COLONOSCOPY (Pts 45-80yrs Insurance coverage will need to be confirmed)  02/23/2023   TETANUS/TDAP  05/08/2030   INFLUENZA VACCINE  Completed   DEXA SCAN  Completed   COVID-19 Vaccine  Completed   HPV VACCINES  Aged Out    Health Maintenance  Health Maintenance Due  Topic Date Due   Zoster Vaccines- Shingrix (1 of 2) Never done   Pneumonia Vaccine 41+ Years old (2 - PPSV23 if available, else PCV20) 10/06/2015    Colorectal cancer screening: No longer required.   Mammogram status: Completed bilateral 07/05/2021. Repeat every  year  Bone Density: Due-Declined today.  Lung Cancer Screening: (Low Dose CT Chest recommended if Age 28-80 years, 30 pack-year currently smoking OR have quit w/in 15years.) does not qualify.     Additional Screening:  Hepatitis C Screening: does not qualify  Vision Screening: Recommended annual ophthalmology exams for early detection of glaucoma and other disorders of the eye. Is the patient up to date with their annual eye exam?  Yes  Who is the provider or what is the name of the office in which the patient attends annual eye exams? Dr. Jacinta Shoe   Dental Screening: Recommended annual dental exams for proper oral hygiene  Community Resource Referral / Chronic Care Management: CRR required this visit?  No   CCM required this visit?  No      Plan:     I have personally reviewed and noted the following in the patient's chart:   Medical and social history Use of alcohol, tobacco or illicit drugs  Current medications and supplements including opioid prescriptions.  Functional ability and status Nutritional status Physical activity Advanced directives List of other physicians Hospitalizations, surgeries, and ER visits in previous 12 months Vitals Screenings to include cognitive, depression, and falls Referrals and appointments  In addition, I have reviewed and discussed with patient certain preventive protocols, quality metrics, and best practice recommendations. A written personalized care plan for preventive services as well as general preventive health recommendations were provided to patient.   Patient to access avs on mychart.  Marta Antu, LPN   [id number]  Nurse Health Advisor  Nurse Notes: None

## 2021-07-30 NOTE — Patient Instructions (Signed)
Mary Buck , Thank you for taking time to come for your Medicare Wellness Visit. I appreciate your ongoing commitment to your health goals. Please review the following plan we discussed and let me know if I can assist you in the future.   Screening recommendations/referrals: Colonoscopy: No longer required Mammogram: Completed 07/05/2021-Due 07/05/2022 Bone Density: Due-Declined today.Please call the office when you are ready to schedule. Recommended yearly ophthalmology/optometry visit for glaucoma screening and checkup Recommended yearly dental visit for hygiene and checkup  Vaccinations: Influenza vaccine: Up to date Pneumococcal vaccine: Up to date Tdap vaccine: Up to date Shingles vaccine: Discuss with pharmacy   Covid-19:Up to date  Advanced directives: Copy in chart  Conditions/risks identified: See problem list  Next appointment: Follow up in one year for your annual wellness visit    Preventive Care 70 Years and Older, Female Preventive care refers to lifestyle choices and visits with your health care provider that can promote health and wellness. What does preventive care include? A yearly physical exam. This is also called an annual well check. Dental exams once or twice a year. Routine eye exams. Ask your health care provider how often you should have your eyes checked. Personal lifestyle choices, including: Daily care of your teeth and gums. Regular physical activity. Eating a healthy diet. Avoiding tobacco and drug use. Limiting alcohol use. Practicing safe sex. Taking low-dose aspirin every day. Taking vitamin and mineral supplements as recommended by your health care provider. What happens during an annual well check? The services and screenings done by your health care provider during your annual well check will depend on your age, overall health, lifestyle risk factors, and family history of disease. Counseling  Your health care provider may ask you  questions about your: Alcohol use. Tobacco use. Drug use. Emotional well-being. Home and relationship well-being. Sexual activity. Eating habits. History of falls. Memory and ability to understand (cognition). Work and work Statistician. Reproductive health. Screening  You may have the following tests or measurements: Height, weight, and BMI. Blood pressure. Lipid and cholesterol levels. These may be checked every 5 years, or more frequently if you are over 68 years old. Skin check. Lung cancer screening. You may have this screening every year starting at age 48 if you have a 30-pack-year history of smoking and currently smoke or have quit within the past 15 years. Fecal occult blood test (FOBT) of the stool. You may have this test every year starting at age 47. Flexible sigmoidoscopy or colonoscopy. You may have a sigmoidoscopy every 5 years or a colonoscopy every 10 years starting at age 57. Hepatitis C blood test. Hepatitis B blood test. Sexually transmitted disease (STD) testing. Diabetes screening. This is done by checking your blood sugar (glucose) after you have not eaten for a while (fasting). You may have this done every 1-3 years. Bone density scan. This is done to screen for osteoporosis. You may have this done starting at age 6. Mammogram. This may be done every 1-2 years. Talk to your health care provider about how often you should have regular mammograms. Talk with your health care provider about your test results, treatment options, and if necessary, the need for more tests. Vaccines  Your health care provider may recommend certain vaccines, such as: Influenza vaccine. This is recommended every year. Tetanus, diphtheria, and acellular pertussis (Tdap, Td) vaccine. You may need a Td booster every 10 years. Zoster vaccine. You may need this after age 84. Pneumococcal 13-valent conjugate (PCV13) vaccine. One dose  is recommended after age 9. Pneumococcal polysaccharide  (PPSV23) vaccine. One dose is recommended after age 89. Talk to your health care provider about which screenings and vaccines you need and how often you need them. This information is not intended to replace advice given to you by your health care provider. Make sure you discuss any questions you have with your health care provider. Document Released: 09/28/2015 Document Revised: 05/21/2016 Document Reviewed: 07/03/2015 Elsevier Interactive Patient Education  2017 [employer] Prevention in the Home Falls can cause injuries. They can happen to people of all ages. There are many things you can do to make your home safe and to help prevent falls. What can I do on the outside of my home? Regularly fix the edges of walkways and driveways and fix any cracks. Remove anything that might make you trip as you walk through a door, such as a raised step or threshold. Trim any bushes or trees on the path to your home. Use bright outdoor lighting. Clear any walking paths of anything that might make someone trip, such as rocks or tools. Regularly check to see if handrails are loose or broken. Make sure that both sides of any steps have handrails. Any raised decks and porches should have guardrails on the edges. Have any leaves, snow, or ice cleared regularly. Use sand or salt on walking paths during winter. Clean up any spills in your garage right away. This includes oil or grease spills. What can I do in the bathroom? Use night lights. Install grab bars by the toilet and in the tub and shower. Do not use towel bars as grab bars. Use non-skid mats or decals in the tub or shower. If you need to sit down in the shower, use a plastic, non-slip stool. Keep the floor dry. Clean up any water that spills on the floor as soon as it happens. Remove soap buildup in the tub or shower regularly. Attach bath mats securely with double-sided non-slip rug tape. Do not have throw rugs and other things on the  floor that can make you trip. What can I do in the bedroom? Use night lights. Make sure that you have a light by your bed that is easy to reach. Do not use any sheets or blankets that are too big for your bed. They should not hang down onto the floor. Have a firm chair that has side arms. You can use this for support while you get dressed. Do not have throw rugs and other things on the floor that can make you trip. What can I do in the kitchen? Clean up any spills right away. Avoid walking on wet floors. Keep items that you use a lot in easy-to-reach places. If you need to reach something above you, use a strong step stool that has a grab bar. Keep electrical cords out of the way. Do not use floor polish or wax that makes floors slippery. If you must use wax, use non-skid floor wax. Do not have throw rugs and other things on the floor that can make you trip. What can I do with my stairs? Do not leave any items on the stairs. Make sure that there are handrails on both sides of the stairs and use them. Fix handrails that are broken or loose. Make sure that handrails are as long as the stairways. Check any carpeting to make sure that it is firmly attached to the stairs. Fix any carpet that is loose or worn. Avoid  having throw rugs at the top or bottom of the stairs. If you do have throw rugs, attach them to the floor with carpet tape. Make sure that you have a light switch at the top of the stairs and the bottom of the stairs. If you do not have them, ask someone to add them for you. What else can I do to help prevent falls? Wear shoes that: Do not have high heels. Have rubber bottoms. Are comfortable and fit you well. Are closed at the toe. Do not wear sandals. If you use a stepladder: Make sure that it is fully opened. Do not climb a closed stepladder. Make sure that both sides of the stepladder are locked into place. Ask someone to hold it for you, if possible. Clearly mark and make  sure that you can see: Any grab bars or handrails. First and last steps. Where the edge of each step is. Use tools that help you move around (mobility aids) if they are needed. These include: Canes. Walkers. Scooters. Crutches. Turn on the lights when you go into a dark area. Replace any light bulbs as soon as they burn out. Set up your furniture so you have a clear path. Avoid moving your furniture around. If any of your floors are uneven, fix them. If there are any pets around you, be aware of where they are. Review your medicines with your doctor. Some medicines can make you feel dizzy. This can increase your chance of falling. Ask your doctor what other things that you can do to help prevent falls. This information is not intended to replace advice given to you by your health care provider. Make sure you discuss any questions you have with your health care provider. Document Released: 06/28/2009 Document Revised: 02/07/2016 Document Reviewed: 10/06/2014 Elsevier Interactive Patient Education  2017 [employer].

## 2021-07-31 ENCOUNTER — Ambulatory Visit (INDEPENDENT_AMBULATORY_CARE_PROVIDER_SITE_OTHER): Payer: Medicare Other | Admitting: Neurology

## 2021-07-31 ENCOUNTER — Encounter: Payer: Self-pay | Admitting: Neurology

## 2021-07-31 VITALS — BP 138/75 | HR 68 | Ht 65.0 in | Wt 164.8 lb

## 2021-07-31 DIAGNOSIS — G4733 Obstructive sleep apnea (adult) (pediatric): Secondary | ICD-10-CM | POA: Diagnosis not present

## 2021-07-31 DIAGNOSIS — G4719 Other hypersomnia: Secondary | ICD-10-CM

## 2021-07-31 DIAGNOSIS — Z789 Other specified health status: Secondary | ICD-10-CM

## 2021-07-31 DIAGNOSIS — Z9989 Dependence on other enabling machines and devices: Secondary | ICD-10-CM

## 2021-07-31 NOTE — Progress Notes (Signed)
Subjective:    Patient ID: Mary Buck, female    DOB: [birth year], 85 y.o.   MRN: [id number]  HPI    Interim history:   Mary Buck is an 85 year old right-handed woman with an underlying complex medical history of lumbar spinal stenosis, history of bladder cancer, cervical spinal stenosis and degenerative spine disease, hypothyroidism, obesity, gait d/o, fibromyalgia, history of neuropathy, history of squamous cell cancer with status post radiation treatment to the left leg as well as treatment with acitretin, who presents for FU consultation of her sleep apnea, after autoPAP start.  The patient is unaccompanied today.  I last saw her on 04/10/2021, at which time she reported feeling reluctant to start AutoPap therapy.  She used it 1 time at home and had a runny days that lasted for several days.  She had returned the machine.  She was encouraged to restart a trial of AutoPap therapy for her moderate obstructive sleep apnea.  A home sleep test in December 2021 had shown an AHI of 26.4/h, O2 nadir 77%.  She had reported short-term memory issues in November 2021.  She was advised that memory issues and sleep apnea can correlate.  Her set up date was 06/06/2021.  She has an air sense 11 AutoSet from [employer].  Today, 07/31/2021: I reviewed her autoPap compliance data from 07/01/2021 through 07/30/2021, which is a total of 30 days, during which time she used her machine every night with percent used days greater than 4 hours at 70%, indicating adequate compliance with an average usage of 4 hours and 59 minutes, residual AHI at goal at 2.8/h, average pressure for the 95th percentile at 8.9 cm with a range of 4 cm to 10 cm, 95th percentile of pressure elevated at 37.5 L/min.  She reports still having a hard time adjusting to treatment.  She is motivated to continue with that but reports discomfort with a nasal mask.  She has not tried a different interface.  She does not know how to adjust the humidifier  and has some mouth dryness at times.  She does not feel that she needs a full facemask but would be willing to try a smaller nasal interface.  She has switched from a small nasal mask to medium but feels that the leak is worse or at least unchanged with a medium mask.  She does endorse daytime somnolence.  Of note, she has been on gabapentin but the dose has not changed.  She is working on changing maybe the timing of the gabapentin to see if her sleepiness improves.   Previously (copied from previous notes for reference):    I saw her on 11/04/2020, at which time we talked about her sleep test results.  She had not started AutoPap therapy at the time.  She was in behavioral therapy and counseling through neuropsychology.  She was also followed by pain management.         I saw her on 07/26/20, At which time she reported a several month history of short-term memory issues.  Her MMSE was 30 out of 30 at the time.  She was advised to proceed with additional evaluation in the form of brain MRI, sleep testing and neuropsychological evaluation.  Interim neuropsychological evaluation did not reveal any significant cognitive disorder.   She had a brain MRI without contrast on 08/02/2020 and I reviewed the results:   IMPRESSION: This MRI of the brain without contrast shows the following: 1.   Mild stable generalized cortical  atrophy 2.   T2/flair hyperintense foci predominantly in the deep white matter most consistent with moderate chronic microvascular ischemic change.  None of the foci appear to be acute.     Her home sleep test on 08/28/2020 which indicated moderate obstructive sleep apnea with an AHI of 26.4/h, O2 nadir of 77%.  She was advised to start AutoPap therapy at home.     03/17/19: (She) reports worsening balance problems for the past few months.  Thankfully she has not fallen.  She fell in December 2018.  She reports forgetfulness and daytime sleepiness.  She takes tramadol 50 mg 3 times  daily and also takes gabapentin, 300 mg twice daily during the day and 600 mg at night.  She denies a family history of Parkinson's disease.  She has wondered if she has Parkinson's-like changes.  She snores according to her partner.  She has never had a sleep study. She had a brain MRI without contrast on 04/05/2018 through [facility] and I reviewed the results:  IMPRESSION: Atrophy and chronic microvascular ischemic change in the white matter. No acute abnormality. She had a cervical spine and lumbar spine MRI without contrast in October 2016 when she was in [place].  I was able to review the report she brought: Study date was 07/09/2015: She had disc protrusion at L2-3, prominent disc bulge and spondylitic changes at L4-5 with moderate bilateral neuroforaminal narrowing and mild bilateral lateral recess stenosis, at L5-S1 she had diffuse disc bulge and bony spinal changes causing moderate to marked bilateral neural foraminal narrowing without canal stenosis.  She had a prior lumbar spine MRI in April 2012 which was reference in this report.  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  She had no evidence of focal cord compression or abnormal cord signal at the time.  She follows with a pain management physician through [facility]. She is retired, she lives with her partner, she quit smoking in July 2015, she does not drink alcohol, she likes to drink caffeine in the form of coffee, about 5 to 6 cups/day.  She does not drink a whole lot of water, estimates that she drinks about 16 ounces per day.  She has radiating lower back pain to the left leg.  She has an area of numbness in the left chin area where she received  radiation therapy.  Her Past Medical History Is Significant For: Past Medical History:  Diagnosis Date   Allergy    Asthma    Cancer ([place])    Cataract    DDD (degenerative disc disease), lumbar 06/01/2017   Depression    GERD (gastroesophageal reflux disease)    Heart murmur    Hyperlipidemia    IDA (iron deficiency anemia) 05/31/2020   Peripheral neuropathy 04/30/2017   Thyroid disease     Her Past Surgical History Is Significant For: Past Surgical History:  Procedure Laterality Date   bladder cancer  2013   CARPAL TUNNEL RELEASE  2015   CATARACT EXTRACTION Bilateral 2012   CHOLECYSTECTOMY     COLONOSCOPY WITH ESOPHAGOGASTRODUODENOSCOPY (EGD)  02/22/2018   [facility] Left 2015   benign   SPINE SURGERY  6/31/21   vertaflex   TONSILLECTOMY AND ADENOIDECTOMY  1943    Her Family History Is Significant For: Family History  Problem Relation Age of Onset   Macular degeneration Mother    Hyperlipidemia Father    Stroke Father    Cancer Brother        ? stomach   Prostate cancer Other        in brothers   Colon cancer Neg Hx     Her Social History Is Significant For: Social History   Socioeconomic History   Marital status: Significant Other    Spouse name: Not on file   Number of children: 0   Years of education: 16   Highest education level: Not on file  Occupational History   Occupation: retired  Tobacco Use   Smoking status: Former    Types: Cigarettes    Quit date: 2015    Years since quitting: 7.8   Smokeless tobacco: Never  Vaping Use   Vaping Use: Never used  Substance and Sexual Activity   Alcohol use: No   Drug use: No   Sexual activity: Not on file  Other Topics Concern   Not on file  Social History Narrative   Not on file   Social Determinants of Health   Financial Resource Strain: Low Risk    Difficulty of Paying Living Expenses: Not hard at all  Food Insecurity: No Food Insecurity    Worried About Charity fundraiser in the Last Year: Never true   Arboriculturist in the Last Year: Never true  Transportation Needs: No Transportation Needs   Lack of Transportation (Medical): No   Lack of Transportation (Non-Medical): No  Physical Activity: Insufficiently Active   Days of Exercise per Week: 7 days   Minutes of Exercise per Session: 20 min  Stress: Stress Concern Present   Feeling of Stress : To some extent  Social Connections: Moderately Isolated   Frequency of Communication with Friends and Family: Once a week   Frequency of Social Gatherings with Friends and Family: Once a week   Attends Religious Services: More than 4 times per year   Active Member of [employer] or Organizations: No   Attends Music therapist: Never   Marital Status: Living with partner    Her Allergies Are:  Allergies  Allergen Reactions   Contrast Media [Iodinated Diagnostic Agents] Anaphylaxis   Oxycodone Anaphylaxis    "my throat swells with any opioid"   Latex Other (See Comments)    Other reaction(s): blistering, redness    Mirtazapine Nausea Only   Acitretin Rash   Cephalexin Other (See Comments)    Unknown   Elemental Sulfur Other (See Comments)    unknown   Penicillins Other (See Comments)    Unknown   Sulfa Antibiotics Other (See Comments)    Unknown  :   Her Current Medications Are:  Outpatient Encounter Medications as of 07/31/2021  Medication Sig   acitretin (SORIATANE) 10 MG capsule Take 10 mg by mouth daily before breakfast.   Cholecalciferol (VITAMIN D-3 PO) Take 1,000 Units by mouth daily.   COVID-19 mRNA bivalent vaccine, Pfizer, (PFIZER COVID-19 VAC BIVALENT) injection Inject into the muscle.   famotidine (PEPCID) 20 MG tablet Take 1 tablet (20 mg total) by mouth at bedtime.   gabapentin (NEURONTIN) 300 MG capsule Take 1 capsule (300 mg total) by mouth 3 (three) times daily.   influenza vaccine adjuvanted (FLUAD) 0.5 ML injection Inject into the muscle.    lansoprazole (PREVACID) 30 MG capsule Take  1 capsule (30 mg total) by mouth 2 (two) times daily before a meal.   levothyroxine (SYNTHROID) 88 MCG tablet TAKE 1 TABLET(88 MCG) BY MOUTH DAILY BEFORE AND BREAKFAST   misoprostol (CYTOTEC) 100 MCG tablet TAKE 2 TABLETS BY MOUTH AFTER BREAKFAST AND AFTER DINNER AS DIRECTED   simvastatin (ZOCOR) 20 MG tablet TAKE 1 TABLET(20 MG) BY MOUTH DAILY   traMADol (ULTRAM) 50 MG tablet Take 50-100 mg by mouth 3 (three) times daily as needed. Takes 50 mg at breakfast and lunch or dinner then 100 mg at bedtime   No facility-administered encounter medications on file as of 07/31/2021.  :  Review of Systems:  Out of a complete 14 point review of systems, all are reviewed and negative with the exception of these symptoms as listed below:  Review of Systems  Neurological:        Pt is here for CPAP follow up . Pt states she has a hard time wearing nose piece . DME has switched out didn't work . Pt states pillows are uncomfortable.   ESS:12 FSS 45      Objective:   Physical Exam Physical Examination:   Vitals:   07/31/21 1309  BP: 138/75  Pulse: 68    General Examination: The patient is a very pleasant 85 y.o. female in no acute distress. She appears well-developed and well-nourished and well groomed.   HEENT: Normocephalic, atraumatic, pupils are equal, round and reactive to light. Tracking is preserved. Hearing is grossly intact. Face is symmetric with normal facial animation. Speech is clear without dysarthria, hypophonia or voice tremor.  No carotid bruits.  Airway examination reveals mild to moderate mouth dryness, dentures in place, stable findings with regard to moderate airway crowding.  She has a mild anterior flexion of her neck, stable.   Chest: Clear to auscultation without wheezing, rhonchi or crackles noted.   Heart: S1+S2+0, regular and normal without murmurs, rubs or gallops noted.   Abdomen: Soft, non-tender and non-distended.    Extremities: There is no obvious edema.     Skin: Warm and dry without trophic changes noted.   Musculoskeletal: exam reveals arthritic changes in both hands, stable.    Neurologically: Mental status: The patient is awake, alert and oriented in all 4 spheres. Her immediate and remote memory, attention, language skills and fund of knowledge are appropriate. There is no evidence of aphasia, agnosia, apraxia or anomia. Speech is clear with normal prosody and enunciation. Thought process is linear. Mood is normal and affect is normal.    (On 07/26/2020: MMSE: 30/30, AFT: 13/min.)   Cranial nerves II - XII are as described above under HEENT exam.   Motor exam: Normal bulk, strength and tone is noted. There is no obvious tremor, fine motor skills are grossly intact.   Cerebellar testing: No dysmetria or intention tremor. There is no truncal or gait ataxia. Sensory exam: intact to light touch. She stands without difficulty and walks with a cane.   Assessment and Plan:    In summary, Floree Zuniga is a very pleasant 85 year old female with an underlying complex medical history of lumbar spinal stenosis, history of bladder cancer, cervical spinal stenosis and degenerative spine disease, hypothyroidism, balance problem, history of fall, obesity, fibromyalgia, history of neuropathy, history of squamous cell cancer with status post radiation treatment to the left leg as well as treatment with acitretin, cognitive complaints and sleep apnea, who presents for follow-up consultation of her OSA.  Her home sleep test from 08/27/2020 indicated  moderate obstructive sleep apnea with an AHI of 26.4/h, O2 nadir 77%.  She started AutoPap therapy, set up date was 06/06/2021.  She is currently compliant with treatment but is still adjusting to it and has not noticed any telltale benefit yet.  She is motivated to continue with treatment.  We talked about changing her interface from a nasal mask to a nasal cushion or  nasal pillows.  She is willing to try this.  I placed an order for this.  In addition, I will asked that her DME representative to show her how to change the humidifier setting and ramp time.  Of note, as far as her memory concerns, neuropsychological evaluation did not reveal any evidence of neurocognitive disorder.  Brain MRI from November 2021 showed stable findings, mild atrophy, moderate white matter changes.  She has daytime somnolence, some of her sleepiness may stem from medication effect but also not sleeping consolidated yet with her AutoPap and she is still adjusting to it.  She is encouraged to continue with treatment and follow-up routinely to see one of our nurse practitioners in 6 months in sleep clinic.  I answered all her questions today and she was in agreement with the plan. I spent 30 minutes in total face-to-face time and in reviewing records during pre-charting, more than 50% of which was spent in counseling and coordination of care, reviewing test results, reviewing medications and treatment regimen and/or in discussing or reviewing the diagnosis of OSA, the prognosis and treatment options. Pertinent laboratory and imaging test results that were available during this visit with the patient were reviewed by me and considered in my medical decision making (see chart for details).

## 2021-08-01 DIAGNOSIS — R2689 Other abnormalities of gait and mobility: Secondary | ICD-10-CM | POA: Diagnosis not present

## 2021-08-06 ENCOUNTER — Other Ambulatory Visit: Payer: Self-pay | Admitting: Family Medicine

## 2021-08-06 DIAGNOSIS — K219 Gastro-esophageal reflux disease without esophagitis: Secondary | ICD-10-CM

## 2021-08-16 ENCOUNTER — Encounter: Payer: Self-pay | Admitting: Family Medicine

## 2021-08-16 NOTE — Telephone Encounter (Signed)
Okay to use Monday at 10 am?

## 2021-08-16 NOTE — Progress Notes (Deleted)
[facility] [address] [place], [place] [address] [phone]  Date:  08/19/2021   Name:  Mary Buck   DOB:  04-21-1936   MRN:  [id number]  PCP:  Darreld Mclean, MD    Chief Complaint: No chief complaint on file.   History of Present Illness:  Mary Buck is a 85 y.o. very pleasant female patient who presents with the following:  Medically complex patient contacted me recently with concern of hip pain- history of significant skin cancer problems, bladder cancer, spinal stenosis, hypothyroidism, hyperlipidemia, peripheral neuropathy, neurogenic claudication, squamous cell cancer to the left leg status post or with radiation  Most recent visit with myself in October of this year Flu vaccine up-to-date COVID booster done  Patient Active Problem List   Diagnosis Date Noted   Thyroid disease    Heart murmur    GERD (gastroesophageal reflux disease)    Depression    Cataract    Cancer ([place])    Asthma    Allergy    IDA (iron deficiency anemia) 05/31/2020   Osteopenia 05/30/2019   Pedal edema 01/31/2018   Squamous cell carcinoma of skin of left lower limb, including hip 12/16/2017   Plantar fasciitis 07/07/2017   DDD (degenerative disc disease), lumbar 06/01/2017   Fibromyalgia 06/01/2017   Spinal stenosis at L4-L5 level 04/30/2017   Peripheral neuropathy 04/30/2017   Encounter for therapeutic drug monitoring 03/05/2017   Bladder cancer ([place]) 08/20/2015   Arthropathy of lumbar facet joint 07/27/2015   Spinal stenosis of cervical region 07/27/2015   Spondylolisthesis 07/27/2015   Hypothyroid 07/05/2015   Abdominal pain, acute, left upper quadrant 06/28/2015   Basal cell carcinoma, face 06/28/2015   Bilateral groin pain 06/28/2015   Chronic pain 06/28/2015   Fatigue 06/28/2015   Hyperlipidemia 06/28/2015   Bilateral hip pain 06/28/2015   Paresthesia of lower lip 06/28/2015   Pulmonary  nodule 06/28/2015   Pustulosis palmaris et plantaris 06/28/2015   Ulcer of nose 06/28/2015   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   Mass of parotid gland 03/24/2014    Past Medical History:  Diagnosis Date   Allergy    Asthma    Cancer ([place])    Cataract    DDD (degenerative disc disease), lumbar 06/01/2017   Depression    GERD (gastroesophageal reflux disease)    Heart murmur    Hyperlipidemia    IDA (iron deficiency anemia) 05/31/2020   Peripheral neuropathy 04/30/2017   Thyroid disease     Past Surgical History:  Procedure Laterality Date   bladder cancer  2013   CARPAL TUNNEL RELEASE  2015   CATARACT EXTRACTION Bilateral 2012   CHOLECYSTECTOMY     COLONOSCOPY WITH ESOPHAGOGASTRODUODENOSCOPY (EGD)  02/22/2018   [facility] Left 2015   benign   SPINE SURGERY  6/31/21   vertaflex   TONSILLECTOMY AND ADENOIDECTOMY  1943    Social History   Tobacco Use   Smoking status: Former    Types: Cigarettes    Quit date: 2015    Years since quitting: 7.9   Smokeless tobacco: Never  Vaping Use   Vaping Use: Never used  Substance Use Topics   Alcohol use: No   Drug use: No    Family History  Problem Relation Age of Onset   Macular degeneration Mother    Hyperlipidemia Father    Stroke Father  Cancer Brother        ? stomach   Prostate cancer Other        in brothers   Colon cancer Neg Hx     Allergies  Allergen Reactions   Contrast Media [Iodinated Diagnostic Agents] Anaphylaxis   Oxycodone Anaphylaxis    "my throat swells with any opioid"   Latex Other (See Comments)    Other reaction(s): blistering, redness    Mirtazapine Nausea Only   Acitretin Rash   Cephalexin Other (See Comments)    Unknown   Elemental Sulfur Other (See Comments)    unknown   Penicillins Other (See Comments)    Unknown   Sulfa Antibiotics Other (See Comments)    Unknown    Medication list has been reviewed and updated.  Current  Outpatient Medications on File Prior to Visit  Medication Sig Dispense Refill   acitretin (SORIATANE) 10 MG capsule Take 10 mg by mouth daily before breakfast.     Cholecalciferol (VITAMIN D-3 PO) Take 1,000 Units by mouth daily.     COVID-19 mRNA bivalent vaccine, Pfizer, (PFIZER COVID-19 VAC BIVALENT) injection Inject into the muscle. 0.3 mL 0   famotidine (PEPCID) 20 MG tablet Take 1 tablet (20 mg total) by mouth at bedtime. 90 tablet 3   gabapentin (NEURONTIN) 300 MG capsule Take 1 capsule (300 mg total) by mouth 3 (three) times daily. 90 capsule 4   influenza vaccine adjuvanted (FLUAD) 0.5 ML injection Inject into the muscle. 0.5 mL 0   lansoprazole (PREVACID) 30 MG capsule TAKE 1 CAPSULE(30 MG) BY MOUTH TWICE DAILY BEFORE A MEAL 180 capsule 1   levothyroxine (SYNTHROID) 88 MCG tablet TAKE 1 TABLET(88 MCG) BY MOUTH DAILY BEFORE AND BREAKFAST 90 tablet 1   misoprostol (CYTOTEC) 100 MCG tablet TAKE 2 TABLETS BY MOUTH AFTER BREAKFAST AND AFTER DINNER AS DIRECTED 120 tablet 3   simvastatin (ZOCOR) 20 MG tablet TAKE 1 TABLET(20 MG) BY MOUTH DAILY 90 tablet 3   traMADol (ULTRAM) 50 MG tablet Take 50-100 mg by mouth 3 (three) times daily as needed. Takes 50 mg at breakfast and lunch or dinner then 100 mg at bedtime     No current facility-administered medications on file prior to visit.    Review of Systems:  As per HPI- otherwise negative.   Physical Examination: There were no vitals filed for this visit. There were no vitals filed for this visit. There is no height or weight on file to calculate BMI. Ideal Body Weight:    GEN: no acute distress. HEENT: Atraumatic, Normocephalic.  Ears and Nose: No external deformity. CV: RRR, No M/G/R. No JVD. No thrill. No extra heart sounds. PULM: CTA B, no wheezes, crackles, rhonchi. No retractions. No resp. distress. No accessory muscle use. ABD: S, NT, ND, +BS. No rebound. No HSM. EXTR: No c/c/e PSYCH: Normally interactive. Conversant.     Assessment and Plan: ***  Signed Lamar Blinks, MD

## 2021-08-19 ENCOUNTER — Ambulatory Visit: Payer: Medicare Other | Admitting: Family Medicine

## 2021-08-20 ENCOUNTER — Other Ambulatory Visit: Payer: Self-pay | Admitting: Gastroenterology

## 2021-08-20 NOTE — Progress Notes (Signed)
[facility] [address], [place], [place] [address] [phone]  Date:  08/21/2021   Name:  Mary Buck   DOB:  1935/10/28   MRN:  [id number]  PCP:  Darreld Mclean, MD    Chief Complaint: Hip Pain (Hip pain and leg pain from the waist down)   History of Present Illness:  Mary Buck is a 85 y.o. very pleasant female patient who presents with the following:  Medically complex patient contacted me recently with concern of hip pain- history of significant skin cancer problems, bladder cancer, spinal stenosis, hypothyroidism, hyperlipidemia, peripheral neuropathy, neurogenic claudication, squamous cell cancer to the left leg status post or with radiation  Most recent visit with myself in October of this year-here today with her partner [name], she has a few concerns Flu vaccine up-to-date COVID booster done  Pt today notes pain from her waist down, her leg may feel like it is going to give away form under her from the knee  Sometimes the pain requires her to take tylenol which does help She is also using gabapentin and tramadol.  However, she feels that Tylenol might help her the most  She a vertiflex device in her spine - this was placed by [facility] Dr Posey Pronto. They have tried to contact them about the spine issue but have not heard back.  Her most recent steroid injection was given a couple of months ago.  The steroid shots seem to help her a lot in the past, recently she has noted decreased benefit  She is putting OTC moisturizing drops in her eyes but they still sting  She is having problems with her hearing - would like to have a hearing test She also has tinnitus which is long standing   She is also concerned about possible sores in her nose- she used mupirocin for this in the past   She has a CPAP machine but she cannot always sleep while using it.  Some nights she does well, other nights the machine keeps  her awake.  She thinks the machine is contributing to her nasal symptoms as well as dry eyes which certainly may be true    Patient Active Problem List   Diagnosis Date Noted   Thyroid disease    Heart murmur    GERD (gastroesophageal reflux disease)    Depression    Cataract    Cancer ([place])    Asthma    Allergy    IDA (iron deficiency anemia) 05/31/2020   Osteopenia 05/30/2019   Pedal edema 01/31/2018   Squamous cell carcinoma of skin of left lower limb, including hip 12/16/2017   Plantar fasciitis 07/07/2017   DDD (degenerative disc disease), lumbar 06/01/2017   Fibromyalgia 06/01/2017   Spinal stenosis at L4-L5 level 04/30/2017   Peripheral neuropathy 04/30/2017   Encounter for therapeutic drug monitoring 03/05/2017   Bladder cancer ([place]) 08/20/2015   Arthropathy of lumbar facet joint 07/27/2015   Spinal stenosis of cervical region 07/27/2015   Spondylolisthesis 07/27/2015   Hypothyroid 07/05/2015   Abdominal pain, acute, left upper quadrant 06/28/2015   Basal cell carcinoma, face 06/28/2015   Bilateral groin pain 06/28/2015   Chronic pain 06/28/2015   Fatigue 06/28/2015   Hyperlipidemia 06/28/2015   Bilateral hip pain 06/28/2015   Paresthesia of lower lip 06/28/2015   Pulmonary nodule 06/28/2015   Pustulosis palmaris et plantaris 06/28/2015   Ulcer of nose 06/28/2015   Warthin  tumor 06/28/2015   Mass of parotid gland 03/24/2014    Past Medical History:  Diagnosis Date   Allergy    Asthma    Cancer ([place])    Cataract    DDD (degenerative disc disease), lumbar 06/01/2017   Depression    GERD (gastroesophageal reflux disease)    Heart murmur    Hyperlipidemia    IDA (iron deficiency anemia) 05/31/2020   Peripheral neuropathy 04/30/2017   Thyroid disease     Past Surgical History:  Procedure Laterality Date   bladder cancer  2013   CARPAL TUNNEL RELEASE  2015   CATARACT EXTRACTION Bilateral 2012   CHOLECYSTECTOMY     COLONOSCOPY WITH  ESOPHAGOGASTRODUODENOSCOPY (EGD)  02/22/2018   [facility] Left 2015   benign   SPINE SURGERY  6/31/21   vertaflex   TONSILLECTOMY AND ADENOIDECTOMY  1943    Social History   Tobacco Use   Smoking status: Former    Types: Cigarettes    Quit date: 2015    Years since quitting: 7.9   Smokeless tobacco: Never  Vaping Use   Vaping Use: Never used  Substance Use Topics   Alcohol use: No   Drug use: No    Family History  Problem Relation Age of Onset   Macular degeneration Mother    Hyperlipidemia Father    Stroke Father    Cancer Brother        ? stomach   Prostate cancer Other        in brothers   Colon cancer Neg Hx     Allergies  Allergen Reactions   Contrast Media [Iodinated Diagnostic Agents] Anaphylaxis   Oxycodone Anaphylaxis    "my throat swells with any opioid"   Latex Other (See Comments)    Other reaction(s): blistering, redness    Mirtazapine Nausea Only   Acitretin Rash   Cephalexin Other (See Comments)    Unknown   Elemental Sulfur Other (See Comments)    unknown   Penicillins Other (See Comments)    Unknown   Sulfa Antibiotics Other (See Comments)    Unknown    Medication list has been reviewed and updated.  Current Outpatient Medications on File Prior to Visit  Medication Sig Dispense Refill   acitretin (SORIATANE) 10 MG capsule Take 10 mg by mouth daily before breakfast.     Cholecalciferol (VITAMIN D-3 PO) Take 1,000 Units by mouth daily.     COVID-19 mRNA bivalent vaccine, Pfizer, (PFIZER COVID-19 VAC BIVALENT) injection Inject into the muscle. 0.3 mL 0   famotidine (PEPCID) 20 MG tablet Take 1 tablet (20 mg total) by mouth at bedtime. 90 tablet 3   gabapentin (NEURONTIN) 300 MG capsule Take 1 capsule (300 mg total) by mouth 3 (three) times daily. 90 capsule 4   influenza vaccine adjuvanted (FLUAD) 0.5 ML injection Inject into the muscle. 0.5 mL 0   lansoprazole (PREVACID) 30 MG capsule  TAKE 1 CAPSULE(30 MG) BY MOUTH TWICE DAILY BEFORE A MEAL 180 capsule 1   levothyroxine (SYNTHROID) 88 MCG tablet TAKE 1 TABLET(88 MCG) BY MOUTH DAILY BEFORE AND BREAKFAST 90 tablet 1   misoprostol (CYTOTEC) 100 MCG tablet TAKE 2 TABLETS BY MOUTH AFTER BREAKFAST AND AFTER DINNER AS DIRECTED 120 tablet 3   simvastatin (ZOCOR) 20 MG tablet TAKE 1 TABLET(20 MG) BY MOUTH DAILY 90 tablet 3   traMADol (ULTRAM) 50 MG tablet Take 50-100 mg by mouth 3 (three) times daily as  needed. Takes 50 mg at breakfast and lunch or dinner then 100 mg at bedtime     No current facility-administered medications on file prior to visit.    Review of Systems:  As per HPI- otherwise negative.   Physical Examination: Vitals:   08/21/21 1427  BP: 132/82  Pulse: 67  Resp: 18  SpO2: 99%   Vitals:   08/21/21 1427  Weight: 162 lb 12.8 oz (73.8 kg)  Height: 5\' 5"  (1.651 m)   Body mass index is 27.09 kg/m. Ideal Body Weight: Weight in (lb) to have BMI = 25: 149.9  GEN: no acute distress.  Looks well, mild overweight HEENT: Atraumatic, Normocephalic.  Both ear canals and TMs are normal Ears and Nose: No external deformity. CV: RRR, No M/G/R. No JVD. No thrill. No extra heart sounds. PULM: CTA B, no wheezes, crackles, rhonchi. No retractions. No resp. distress. No accessory muscle use. ABD: S, NT, ND, +BS. No rebound. No HSM. EXTR: No c/c/e PSYCH: Normally interactive. Conversant.  Normal range of motion of both knees.  Gait is normal for age Some inflammation of the nasal cavity   Assessment and Plan: Hip pain - Plan: CANCELED: DG HIP UNILAT W OR W/O PELVIS 2-3 VIEWS RIGHT, CANCELED: DG Hip Unilat W OR W/O Pelvis 2-3 Views Left  Chronic pain of both knees - Plan: DG Knee 1-2 Views Left, DG Knee 1-2 Views Right  Nose congested - Plan: mupirocin ointment (BACTROBAN) 2 %  Dry eyes  Hearing loss, unspecified hearing loss type, unspecified laterality - Plan: Ambulatory referral to Audiology  Patient seen  today with a few concerns.  She notes pain in both of her hips as well as both of her knees.  She has been mostly treated for spine problems in the past.  I explained that she may also have some arthritis in her hip and knee joints, potentially seeing a more general orthopedist could also be helpful.  We will obtain x-rays for her today I will be in touch pending these results  Prescription for Bactroban to use as needed for nasal sores  Discussed strategies for dealing with dry eyes  Signed Lamar Blinks, MD  Received her x-rays as below. DG Knee 1-2 Views Left  Result Date: 08/21/2021 CLINICAL DATA:  Knee pain. EXAM: LEFT KNEE - 1-2 VIEW COMPARISON:  None. FINDINGS: Mild degenerative spurring is noted in all 3 compartments. No evidence for acute fracture. No suspicious lytic or sclerotic osseous abnormality. No joint effusion. IMPRESSION: Mild tricompartmental degenerative changes without joint effusion. Electronically Signed   By: Misty Stanley M.D.   On: 08/21/2021 16:13   DG Knee 1-2 Views Right  Result Date: 08/21/2021 CLINICAL DATA:  Bilateral knee pain without known injury. EXAM: RIGHT KNEE - 1-2 VIEW COMPARISON:  None. FINDINGS: No evidence of fracture, dislocation, or joint effusion. Mild narrowing of the medial and lateral joint spaces are noted. Mild patellar spurring is noted. Soft tissues are unremarkable. IMPRESSION: Mild degenerative changes as described above. No acute abnormality seen. Electronically Signed   By: Marijo Conception M.D.   On: 08/21/2021 16:14   DG HIPS BILAT WITH PELVIS MIN 5 VIEWS  Result Date: 08/21/2021 CLINICAL DATA:  Hip pain EXAM: DG HIP (WITH OR WITHOUT PELVIS) 5+V BILAT COMPARISON:  12/12/2019 FINDINGS: Hardware in the lower lumbar spine. The SI joints are non widened. Pubic symphysis and rami appear intact. Right hip: No fracture or malalignment. Mild degenerative changes with joint space narrowing and femoral head neck  spurring. Left hip: Mild to  moderate degenerative change of the left hip with joint space narrowing and femoral head neck spurring. IMPRESSION: 1. No acute osseous abnormality. 2. Left greater than right hip arthritis. Electronically Signed   By: Donavan Foil M.D.   On: 08/21/2021 16:13

## 2021-08-21 ENCOUNTER — Other Ambulatory Visit: Payer: Self-pay

## 2021-08-21 ENCOUNTER — Other Ambulatory Visit: Payer: Self-pay | Admitting: Family Medicine

## 2021-08-21 ENCOUNTER — Encounter: Payer: Self-pay | Admitting: Family Medicine

## 2021-08-21 ENCOUNTER — Ambulatory Visit (HOSPITAL_BASED_OUTPATIENT_CLINIC_OR_DEPARTMENT_OTHER)
Admission: RE | Admit: 2021-08-21 | Discharge: 2021-08-21 | Disposition: A | Discharge status: Home / Self Care | Payer: Medicare Other | Source: Ambulatory Visit | Attending: Family Medicine | Admitting: Family Medicine

## 2021-08-21 ENCOUNTER — Ambulatory Visit (INDEPENDENT_AMBULATORY_CARE_PROVIDER_SITE_OTHER): Payer: Medicare Other | Admitting: Family Medicine

## 2021-08-21 ENCOUNTER — Encounter (HOSPITAL_BASED_OUTPATIENT_CLINIC_OR_DEPARTMENT_OTHER): Payer: Self-pay

## 2021-08-21 VITALS — BP 132/82 | HR 67 | Resp 18 | Ht 65.0 in | Wt 162.8 lb

## 2021-08-21 DIAGNOSIS — R0981 Nasal congestion: Secondary | ICD-10-CM

## 2021-08-21 DIAGNOSIS — G8929 Other chronic pain: Secondary | ICD-10-CM

## 2021-08-21 DIAGNOSIS — M25561 Pain in right knee: Secondary | ICD-10-CM | POA: Diagnosis not present

## 2021-08-21 DIAGNOSIS — H04123 Dry eye syndrome of bilateral lacrimal glands: Secondary | ICD-10-CM

## 2021-08-21 DIAGNOSIS — M1712 Unilateral primary osteoarthritis, left knee: Secondary | ICD-10-CM | POA: Diagnosis not present

## 2021-08-21 DIAGNOSIS — M25559 Pain in unspecified hip: Secondary | ICD-10-CM

## 2021-08-21 DIAGNOSIS — M25562 Pain in left knee: Secondary | ICD-10-CM | POA: Diagnosis not present

## 2021-08-21 DIAGNOSIS — H9190 Unspecified hearing loss, unspecified ear: Secondary | ICD-10-CM

## 2021-08-21 DIAGNOSIS — M160 Bilateral primary osteoarthritis of hip: Secondary | ICD-10-CM | POA: Diagnosis not present

## 2021-08-21 MED ORDER — MUPIROCIN 2 % EX OINT: 1.0000 "application " | TOPICAL_OINTMENT | Freq: Two times a day (BID) | CUTANEOUS | 0 refills | Status: DC

## 2021-08-21 NOTE — Patient Instructions (Addendum)
It was good to see you again today  I will be in touch with your x-rays asap; we might have you see a general ortho about your joints if you like  Ok to use tylenol standing 2 or 3x for your pain- ok to use on a standing/ every day basis  I put in a referral to audiology for hearing testing for you Try the mupirocin for your nose as needed

## 2021-08-22 ENCOUNTER — Ambulatory Visit: Payer: Medicare Other | Attending: Family Medicine | Admitting: Audiologist

## 2021-08-22 DIAGNOSIS — H903 Sensorineural hearing loss, bilateral: Secondary | ICD-10-CM | POA: Diagnosis not present

## 2021-08-22 NOTE — Procedures (Signed)
  Outpatient Audiology and [place], [place]  [address] [phone]  AUDIOLOGICAL  EVALUATION  NAME: Mary Buck     DOB:   1936/05/04      MRN: [id number]                                                                                     DATE: 08/22/2021     REFERENT: Darreld Mclean, MD STATUS: Outpatient DIAGNOSIS: Sensorineural Hearing Loss    History: Mary Buck was seen for an audiological evaluation.  Mary Buck is receiving a hearing evaluation due to concerns for difficulty hearing. Mary Buck has difficulty hearing in background noise, crowds, and when watching the [facility] on television. This difficulty began gradually. No pain or pressure reported in either ear. Tinnitus present in both ears for many years. It sounds like a very high pitched tone. Mary Buck has a history of noise exposure from living in [place].  Medical history positive for peripheral neuropathy which is a risk factor for hearing loss. No other relevant case history reported.    Evaluation:  Otoscopy showed a clear view of the tympanic membranes, bilaterally Tympanometry results were consistent with normal middle ear pressure, bilaterally   Audiometric testing was completed using conventional audiometry with insert transducer. Speech Recognition Thresholds were consistent with pure tone averages at 25dB in each ear. Word Recognition was excellent at an elevated level of 40dB SL. Pure tone thresholds show  normal sloping to moderate high frequency sensorineural hearing loss in both ears. Test results are consistent with age related changes.   Results:  The test results were reviewed with Mary Buck, she was counseled on the nature and degree of her hearing loss. Her hearing loss is in the high frequencies preventing Mary Buck from hearing high frequency consonants such as /s/ /sh/ /f/ /t/ and /th/. These sounds help differentiate the words she hears. Without these sounds, speech is  muffled and unclear unless someone is face to face within 5 feet without a mask. Mary Buck will benefit from talking to people face to face within five feet so she can see their face. Also recommend a soundbar for the television to improve ability to hear her shows. Mary Buck said she is already using closed captions.    Recommendations: 1.   Recommend communicating face to face and using a soundbar with the television. 2.   Monitor hearing annually or sooner if changes occur.   Alfonse Alpers  Audiologist, Au.D., CCC-A 08/22/2021  1:25 PM  Cc: Darreld Mclean, MD

## 2021-08-30 ENCOUNTER — Encounter: Payer: Self-pay | Admitting: Family Medicine

## 2021-08-30 DIAGNOSIS — R0981 Nasal congestion: Secondary | ICD-10-CM

## 2021-08-30 DIAGNOSIS — M25559 Pain in unspecified hip: Secondary | ICD-10-CM

## 2021-08-30 DIAGNOSIS — G8929 Other chronic pain: Secondary | ICD-10-CM

## 2021-09-02 DIAGNOSIS — M48062 Spinal stenosis, lumbar region with neurogenic claudication: Secondary | ICD-10-CM | POA: Diagnosis not present

## 2021-09-02 DIAGNOSIS — M47816 Spondylosis without myelopathy or radiculopathy, lumbar region: Secondary | ICD-10-CM | POA: Diagnosis not present

## 2021-09-02 DIAGNOSIS — M5136 Other intervertebral disc degeneration, lumbar region: Secondary | ICD-10-CM | POA: Diagnosis not present

## 2021-09-03 ENCOUNTER — Other Ambulatory Visit: Payer: Self-pay

## 2021-09-03 ENCOUNTER — Ambulatory Visit: Payer: Medicare Other | Admitting: Psychology

## 2021-09-03 ENCOUNTER — Encounter: Payer: Medicare Other | Attending: Psychology | Admitting: Psychology

## 2021-09-03 DIAGNOSIS — G8929 Other chronic pain: Secondary | ICD-10-CM | POA: Insufficient documentation

## 2021-09-03 DIAGNOSIS — F411 Generalized anxiety disorder: Secondary | ICD-10-CM | POA: Diagnosis not present

## 2021-09-03 DIAGNOSIS — F341 Dysthymic disorder: Secondary | ICD-10-CM | POA: Insufficient documentation

## 2021-09-04 ENCOUNTER — Encounter: Payer: Self-pay | Admitting: Psychology
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
## 2021-09-18 DIAGNOSIS — M25559 Pain in unspecified hip: Secondary | ICD-10-CM | POA: Diagnosis not present

## 2021-09-19 ENCOUNTER — Other Ambulatory Visit: Payer: Self-pay

## 2021-09-19 ENCOUNTER — Encounter: Payer: Medicare Other | Attending: Psychology | Admitting: Psychology

## 2021-09-19 DIAGNOSIS — F341 Dysthymic disorder: Secondary | ICD-10-CM

## 2021-09-19 DIAGNOSIS — F411 Generalized anxiety disorder: Secondary | ICD-10-CM | POA: Diagnosis not present

## 2021-09-19 DIAGNOSIS — G8929 Other chronic pain: Secondary | ICD-10-CM | POA: Diagnosis not present

## 2021-10-03 ENCOUNTER — Ambulatory Visit: Payer: Medicare Other | Admitting: Psychology

## 2021-10-09 DIAGNOSIS — M25559 Pain in unspecified hip: Secondary | ICD-10-CM | POA: Diagnosis not present

## 2021-10-14 DIAGNOSIS — M25559 Pain in unspecified hip: Secondary | ICD-10-CM | POA: Diagnosis not present

## 2021-10-15 DIAGNOSIS — Z006 Encounter for examination for normal comparison and control in clinical research program: Secondary | ICD-10-CM | POA: Diagnosis not present

## 2021-10-15 DIAGNOSIS — K219 Gastro-esophageal reflux disease without esophagitis: Secondary | ICD-10-CM | POA: Diagnosis not present

## 2021-10-15 DIAGNOSIS — M48062 Spinal stenosis, lumbar region with neurogenic claudication: Secondary | ICD-10-CM | POA: Diagnosis not present

## 2021-10-22 ENCOUNTER — Other Ambulatory Visit: Payer: Self-pay | Admitting: Family Medicine

## 2021-10-23 ENCOUNTER — Encounter: Payer: Self-pay | Admitting: Psychology

## 2021-10-23 DIAGNOSIS — M25559 Pain in unspecified hip: Secondary | ICD-10-CM | POA: Diagnosis not present
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
## 2021-10-24 ENCOUNTER — Other Ambulatory Visit: Payer: Self-pay

## 2021-10-24 ENCOUNTER — Encounter: Payer: Medicare Other | Attending: Psychology | Admitting: Psychology

## 2021-10-24 DIAGNOSIS — F341 Dysthymic disorder: Secondary | ICD-10-CM | POA: Insufficient documentation

## 2021-10-24 DIAGNOSIS — G8929 Other chronic pain: Secondary | ICD-10-CM | POA: Insufficient documentation

## 2021-10-24 DIAGNOSIS — F411 Generalized anxiety disorder: Secondary | ICD-10-CM | POA: Diagnosis not present

## 2021-10-28 ENCOUNTER — Telehealth: Payer: Self-pay | Admitting: Family

## 2021-10-28 ENCOUNTER — Inpatient Hospital Stay: Payer: Medicare Other | Attending: Hematology & Oncology

## 2021-10-28 ENCOUNTER — Encounter: Payer: Self-pay | Admitting: Family

## 2021-10-28 ENCOUNTER — Inpatient Hospital Stay: Payer: Medicare Other

## 2021-10-28 ENCOUNTER — Inpatient Hospital Stay (HOSPITAL_BASED_OUTPATIENT_CLINIC_OR_DEPARTMENT_OTHER): Payer: Medicare Other | Admitting: Family

## 2021-10-28 ENCOUNTER — Other Ambulatory Visit: Payer: Self-pay

## 2021-10-28 VITALS — BP 145/59 | HR 65 | Temp 98.1°F | Resp 17 | Ht 65.0 in | Wt 163.0 lb

## 2021-10-28 DIAGNOSIS — D508 Other iron deficiency anemias: Secondary | ICD-10-CM

## 2021-10-28 DIAGNOSIS — Z79899 Other long term (current) drug therapy: Secondary | ICD-10-CM | POA: Insufficient documentation

## 2021-10-28 DIAGNOSIS — D509 Iron deficiency anemia, unspecified: Secondary | ICD-10-CM | POA: Diagnosis not present

## 2021-10-28 LAB — CBC WITH DIFFERENTIAL (CANCER CENTER ONLY)
Abs Immature Granulocytes: 0.05 10*3/uL (ref 0.00–0.07)
Basophils Absolute: 0.1 10*3/uL (ref 0.0–0.1)
Basophils Relative: 2 %
Eosinophils Absolute: 0.6 10*3/uL — ABNORMAL HIGH (ref 0.0–0.5)
Eosinophils Relative: 12 %
HCT: 38.1 % (ref 36.0–46.0)
Hemoglobin: 12 g/dL (ref 12.0–15.0)
Immature Granulocytes: 1 %
Lymphocytes Relative: 37 %
Lymphs Abs: 1.7 10*3/uL (ref 0.7–4.0)
MCH: 30.8 pg (ref 26.0–34.0)
MCHC: 31.5 g/dL (ref 30.0–36.0)
MCV: 97.7 fL (ref 80.0–100.0)
Monocytes Absolute: 0.5 10*3/uL (ref 0.1–1.0)
Monocytes Relative: 10 %
Neutro Abs: 1.8 10*3/uL (ref 1.7–7.7)
Neutrophils Relative %: 38 %
Platelet Count: 217 10*3/uL (ref 150–400)
RBC: 3.9 MIL/uL (ref 3.87–5.11)
RDW: 13.2 % (ref 11.5–15.5)
WBC Count: 4.6 10*3/uL (ref 4.0–10.5)
nRBC: 0 % (ref 0.0–0.2)

## 2021-10-28 LAB — RETICULOCYTES
Immature Retic Fract: 9.8 % (ref 2.3–15.9)
RBC.: 3.93 MIL/uL (ref 3.87–5.11)
Retic Count, Absolute: 40.9 10*3/uL (ref 19.0–186.0)
Retic Ct Pct: 1 % (ref 0.4–3.1)

## 2021-10-28 NOTE — Progress Notes (Signed)
Hematology and Oncology Follow Up Visit  Mary Buck [id number] June 14, 1936 86 y.o. 10/28/2021   Principle Diagnosis:  Iron deficiency anemia    Current Therapy:        IV iron as indicated    Interim History:  Mary Buck is here today for follow-up. She had surgery for percutaneous decompression laminotomy L1-L2, L2-L3 on 10/15/2021.  She states that she did a mild stretching workout last week and is still feeling sore. She had a shooting pain in her left lower leg earlier this week.  She has chronic left lower extremity swelling/hyperpigmentation post radiation for skin cancer. She states that her dermatologist is aware and has followed now for 2 years. She states that this is stable. Pedal pulses are 2+.  No falls or syncope to report.  No fever, chills, n/v, cough, rash, dizziness, SOB, chest pain, palpitations or changes in bowel or bladder habits.  She has occasional abdominal pain which she states is not consistent. She has not identified a trigger yet.  No blood loss noted. No bruising or petechiae.  She states that her appetite comes and goes. She is trying her best to stay well hydrated. Her weight is stable at 163 lbs.   ECOG Performance Status: 1 - Symptomatic but completely ambulatory  Medications:  Allergies as of 10/28/2021       Reactions   Contrast Media [iodinated Contrast Media] Anaphylaxis   Oxycodone Anaphylaxis   "my throat swells with any opioid"   Latex Other (See Comments)   Other reaction(s): blistering, redness    Mirtazapine Nausea Only   Acitretin Rash   Cephalexin Other (See Comments)   Unknown   Elemental Sulfur Other (See Comments)   unknown   Penicillins Other (See Comments)   Unknown   Sulfa Antibiotics Other (See Comments)   Unknown        Medication List        Accurate as of October 28, 2021  1:51 PM. If you have any questions, ask your nurse or doctor.          acitretin 10 MG capsule Commonly known as:  SORIATANE Take 10 mg by mouth daily before breakfast. Three times a week   famotidine 20 MG tablet Commonly known as: Pepcid Take 1 tablet (20 mg total) by mouth at bedtime.   Fluad Quadrivalent 0.5 ML injection Generic drug: influenza vaccine adjuvanted Inject into the muscle.   gabapentin 300 MG capsule Commonly known as: NEURONTIN Take 1 capsule (300 mg total) by mouth 3 (three) times daily.   lansoprazole 30 MG capsule Commonly known as: PREVACID TAKE 1 CAPSULE(30 MG) BY MOUTH TWICE DAILY BEFORE A MEAL   levothyroxine 88 MCG tablet Commonly known as: SYNTHROID TAKE 1 TABLET(88 MCG) BY MOUTH DAILY BEFORE AND BREAKFAST   misoprostol 100 MCG tablet Commonly known as: CYTOTEC TAKE 2 TABLETS BY MOUTH AFTER BREAKFAST AND AFTER DINNER AS DIRECTED   mupirocin ointment 2 % Commonly known as: BACTROBAN Apply 1 application topically 2 (two) times daily. Use as needed for nose sores   Pfizer COVID-19 Vac Bivalent injection Generic drug: COVID-19 mRNA bivalent vaccine (Pfizer) Inject into the muscle.   simvastatin 20 MG tablet Commonly known as: ZOCOR TAKE 1 TABLET(20 MG) BY MOUTH DAILY   traMADol 50 MG tablet Commonly known as: ULTRAM Take 50-100 mg by mouth 3 (three) times daily as needed. Takes 50 mg at breakfast and lunch or dinner then 100 mg at bedtime   VITAMIN D-3 PO Take  1,000 Units by mouth daily.        Allergies:  Allergies  Allergen Reactions   Contrast Media [Iodinated Contrast Media] Anaphylaxis   Oxycodone Anaphylaxis    "my throat swells with any opioid"   Latex Other (See Comments)    Other reaction(s): blistering, redness    Mirtazapine Nausea Only   Acitretin Rash   Cephalexin Other (See Comments)    Unknown   Elemental Sulfur Other (See Comments)    unknown   Penicillins Other (See Comments)    Unknown   Sulfa Antibiotics Other (See Comments)    Unknown    Past Medical History, Surgical history, Social history, and Family History were  reviewed and updated.  Review of Systems: All other 10 point review of systems is negative.   Physical Exam:  height is 5\' 5"  (1.651 m) and weight is 163 lb (73.9 kg). Her oral temperature is 98.1 F (36.7 C). Her blood pressure is 145/59 (abnormal) and her pulse is 65. Her respiration is 17 and oxygen saturation is 96%.   Wt Readings from Last 3 Encounters:  10/28/21 163 lb (73.9 kg)  08/21/21 162 lb 12.8 oz (73.8 kg)  07/31/21 164 lb 12.8 oz (74.8 kg)    Ocular: Sclerae unicteric, pupils equal, round and reactive to light Ear-nose-throat: Oropharynx clear, dentition fair Lymphatic: No cervical or supraclavicular adenopathy Lungs no rales or rhonchi, good excursion bilaterally Heart regular rate and rhythm, no murmur appreciated Abd soft, nontender, positive bowel sounds MSK no focal spinal tenderness, no joint edema Neuro: non-focal, well-oriented, appropriate affect Breasts: Deferred   Lab Results  Component Value Date   WBC 4.6 10/28/2021   HGB 12.0 10/28/2021   HCT 38.1 10/28/2021   MCV 97.7 10/28/2021   PLT 217 10/28/2021   Lab Results  Component Value Date   FERRITIN 69 04/29/2021   IRON 88 04/29/2021   TIBC 219 (L) 04/29/2021   UIBC 131 04/29/2021   IRONPCTSAT 40 04/29/2021   Lab Results  Component Value Date   RETICCTPCT 1.0 10/28/2021   RBC 3.90 10/28/2021   RBC 3.93 10/28/2021   No results found for: KPAFRELGTCHN, LAMBDASER, KAPLAMBRATIO No results found for: IGGSERUM, IGA, IGMSERUM No results found for: Odetta Pink, SPEI   Chemistry      Component Value Date/Time   NA 143 06/17/2021 1409   K 4.6 06/17/2021 1409   CL 104 06/17/2021 1409   CO2 31 06/17/2021 1409   BUN 13 06/17/2021 1409   CREATININE 0.92 06/17/2021 1409   CREATININE 0.92 05/30/2020 1453      Component Value Date/Time   CALCIUM 9.0 06/17/2021 1409   ALKPHOS 80 06/17/2021 1409   AST 17 06/17/2021 1409   AST 15 05/30/2020  1453   ALT 12 06/17/2021 1409   ALT 10 05/30/2020 1453   BILITOT 0.4 06/17/2021 1409   BILITOT 0.4 05/30/2020 1453       Impression and Plan: Mary Buck is a very pleasant 86 yo female with history of iron deficiency anemia.  Iron studies are pending. We will replace if needed.  Follow-up in 1 year.   Lottie Dawson, NP 2/13/20231:51 PM

## 2021-10-28 NOTE — Telephone Encounter (Signed)
Called to offer a later appointment for today to schedule follow up with Lottie Dawson, left voicemail

## 2021-10-29 LAB — IRON AND IRON BINDING CAPACITY (CC-WL,HP ONLY)
Iron: 79 ug/dL (ref 28–170)
Saturation Ratios: 27 % (ref 10.4–31.8)
TIBC: 294 ug/dL (ref 250–450)
UIBC: 215 ug/dL (ref 148–442)

## 2021-10-29 LAB — FERRITIN: Ferritin: 57 ng/mL (ref 11–307)

## 2021-10-30 ENCOUNTER — Telehealth: Payer: Self-pay | Admitting: *Deleted

## 2021-10-30 DIAGNOSIS — M25559 Pain in unspecified hip: Secondary | ICD-10-CM | POA: Diagnosis not present

## 2021-10-30 NOTE — Telephone Encounter (Signed)
Per 10/28/21 los - called and lvm of upcoming appointment - requested call back to confirm

## 2021-10-31 DIAGNOSIS — L570 Actinic keratosis: Secondary | ICD-10-CM | POA: Diagnosis not present

## 2021-10-31 DIAGNOSIS — L309 Dermatitis, unspecified: Secondary | ICD-10-CM | POA: Diagnosis not present

## 2021-10-31 DIAGNOSIS — Z85828 Personal history of other malignant neoplasm of skin: Secondary | ICD-10-CM | POA: Diagnosis not present

## 2021-11-05 DIAGNOSIS — M25559 Pain in unspecified hip: Secondary | ICD-10-CM | POA: Diagnosis not present
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
## 2021-11-12 DIAGNOSIS — M25559 Pain in unspecified hip: Secondary | ICD-10-CM | POA: Diagnosis not present

## 2021-11-20 DIAGNOSIS — L400 Psoriasis vulgaris: Secondary | ICD-10-CM | POA: Diagnosis not present

## 2021-11-20 DIAGNOSIS — Z85828 Personal history of other malignant neoplasm of skin: Secondary | ICD-10-CM | POA: Diagnosis not present

## 2021-11-21 ENCOUNTER — Other Ambulatory Visit: Payer: Self-pay | Admitting: Family Medicine

## 2021-11-21 DIAGNOSIS — M48062 Spinal stenosis, lumbar region with neurogenic claudication: Secondary | ICD-10-CM | POA: Diagnosis not present

## 2021-11-21 DIAGNOSIS — Z79899 Other long term (current) drug therapy: Secondary | ICD-10-CM | POA: Diagnosis not present

## 2021-11-21 DIAGNOSIS — G894 Chronic pain syndrome: Secondary | ICD-10-CM | POA: Diagnosis not present

## 2021-11-21 DIAGNOSIS — M5136 Other intervertebral disc degeneration, lumbar region: Secondary | ICD-10-CM | POA: Diagnosis not present

## 2021-11-22 NOTE — Progress Notes (Signed)
[facility] ?[address], [address] ?[place], [place] [address] ?336 [phone] ?Fax [phone] ? ?Date:  11/25/2021  ? ?Name:  Mary Buck   DOB:  November 16, 1935   MRN:  [id number] ? ?PCP:  Mary Mclean, MD  ? ? ?Chief Complaint: 6 month follow up (Concerns/ questions: none) ? ? ?History of Present Illness: ? ?Mary Buck is a 86 y.o. very pleasant female patient who presents with the following: ? ?Patient seen today for follow-up visit ?Pt notes she scheduled this appt as her back was really bothering her- on occasion it bothers her so much that she cannot sleep.  However ,this does not happen all the time.  Right now back seems to be improved ? ?She does notice occasional pains in her sides  ? ?Most recently seen by myself in December- - history of significant skin cancer problems, bladder cancer, spinal stenosis, hypothyroidism, hyperlipidemia, peripheral neuropathy, neurogenic claudication, squamous cell cancer to the left leg status post or with radiation ? ?Mary Buck has had more difficulty the last couple of years.  More aches and pains, more issues with her back.  Her partner Mary Buck is very supportive ?She is doing physical therapy for her back and hip pain ?She underwent a percutaneous lumbar decompression January 30: ?PLAN:  ?1) S/p minimally invasive lumbar decompression (MILD) at L1-L2 and L2-L3. ?2) Postop pain control with Norco 5/'325mg'$  qh4 prn Rx#20. ?3) Return to clinic in 7-14 days for postprocedure evaluation. Physical therapy treatment was strongly encouraged.  ? ?She was most recently seen by pain management on March 9 for her spinal stenosis/neurogenic claudication  ?Unfortunately she did not notice much improvement ?Mammogram is up-to-date ?Can update bone density ? ?She does feel better if she bends forward- she is getting a walker through [facility] or they might just order one from [facility].  Advise she can use this as needed, leaning  forward oftentimes does help with spinal stenosis ?Patient Active Problem List  ? Diagnosis Date Noted  ? Thyroid disease   ? Heart murmur   ? GERD (gastroesophageal reflux disease)   ? Depression   ? Cataract   ? Cancer [facility])   ? Asthma   ? Allergy   ? IDA (iron deficiency anemia) 05/31/2020  ? Osteopenia 05/30/2019  ? Pedal edema 01/31/2018  ? Squamous cell carcinoma of skin of left lower limb, including hip 12/16/2017  ? Plantar fasciitis 07/07/2017  ? DDD (degenerative disc disease), lumbar 06/01/2017  ? Fibromyalgia 06/01/2017  ? Spinal stenosis at L4-L5 level 04/30/2017  ? Peripheral neuropathy 04/30/2017  ? Encounter for therapeutic drug monitoring 03/05/2017  ? Bladder cancer ([place]) 08/20/2015  ? Arthropathy of lumbar facet joint 07/27/2015  ? Spinal stenosis of cervical region 07/27/2015  ? Spondylolisthesis 07/27/2015  ? Hypothyroid 07/05/2015  ? Abdominal pain, acute, left upper quadrant 06/28/2015  ? Basal cell carcinoma, face 06/28/2015  ? Bilateral groin pain 06/28/2015  ? Chronic pain 06/28/2015  ? Fatigue 06/28/2015  ? Hyperlipidemia 06/28/2015  ? Bilateral hip pain 06/28/2015  ? Paresthesia of lower lip 06/28/2015  ? Pulmonary nodule 06/28/2015  ? Pustulosis palmaris et plantaris 06/28/2015  ? Ulcer of nose 06/28/2015  ? 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  ? Mass of parotid gland 03/24/2014  ? ? ?Past Medical History:  ?Diagnosis Date  ? Allergy   ? Asthma   ? Cancer [facility])   ? Cataract   ? DDD (degenerative disc disease), lumbar 06/01/2017  ?  Depression   ? GERD (gastroesophageal reflux disease)   ? Heart murmur   ? Hyperlipidemia   ? IDA (iron deficiency anemia) 05/31/2020  ? Peripheral neuropathy 04/30/2017  ? Thyroid disease   ? ? ?Past Surgical History:  ?Procedure Laterality Date  ? bladder cancer  2013  ? CARPAL TUNNEL RELEASE  2015  ? CATARACT EXTRACTION Bilateral 2012  ? CHOLECYSTECTOMY    ? COLONOSCOPY WITH ESOPHAGOGASTRODUODENOSCOPY (EGD)  02/22/2018  ? [place] [facility]  ?  SALIVARY GLAND SURGERY Left 2015  ? benign  ? SPINE SURGERY  6/31/21  ? vertaflex  ? TONSILLECTOMY AND ADENOIDECTOMY  1943  ? ? ?Social History  ? ?Tobacco Use  ? Smoking status: Former  ?  Types: Cigarettes  ?  Quit date: 2015  ?  Years since quitting: 8.2  ? Smokeless tobacco: Never  ?Vaping Use  ? Vaping Use: Never used  ?Substance Use Topics  ? Alcohol use: No  ? Drug use: No  ? ? ?Family History  ?Problem Relation Age of Onset  ? Macular degeneration Mother   ? Hyperlipidemia Father   ? Stroke Father   ? Cancer Brother   ?     ? stomach  ? Prostate cancer Other   ?     in brothers  ? Colon cancer Neg Hx   ? ? ?Allergies  ?Allergen Reactions  ? Contrast Media [Iodinated Contrast Media] Anaphylaxis  ? Oxycodone Anaphylaxis  ?  "my throat swells with any opioid"  ? Latex Other (See Comments)  ?  Other reaction(s): blistering, redness   ? Mirtazapine Nausea Only  ? Acitretin Rash  ? Cephalexin Other (See Comments)  ?  Unknown  ? Elemental Sulfur Other (See Comments)  ?  unknown  ? Penicillins Other (See Comments)  ?  Unknown  ? Sulfa Antibiotics Other (See Comments)  ?  Unknown  ? ? ?Medication list has been reviewed and updated. ? ?Current Outpatient Medications on File Prior to Visit  ?Medication Sig Dispense Refill  ? acitretin (SORIATANE) 25 MG capsule Take by mouth.    ? Cholecalciferol (VITAMIN D-3 PO) Take 1,000 Units by mouth daily.    ? famotidine (PEPCID) 20 MG tablet Take 1 tablet (20 mg total) by mouth at bedtime. 90 tablet 3  ? gabapentin (NEURONTIN) 300 MG capsule Take 1 capsule (300 mg total) by mouth 3 (three) times daily. 90 capsule 4  ? lansoprazole (PREVACID) 30 MG capsule TAKE 1 CAPSULE(30 MG) BY MOUTH TWICE DAILY BEFORE A MEAL 180 capsule 1  ? levothyroxine (SYNTHROID) 88 MCG tablet TAKE 1 TABLET(88 MCG) BY MOUTH DAILY BEFORE AND BREAKFAST 90 tablet 1  ? misoprostol (CYTOTEC) 100 MCG tablet TAKE 2 TABLETS BY MOUTH AFTER BREAKFAST AND AFTER DINNER AS DIRECTED 120 tablet 3  ? mupirocin ointment  (BACTROBAN) 2 % Apply 1 application topically 2 (two) times daily. Use as needed for nose sores 22 g 0  ? simvastatin (ZOCOR) 20 MG tablet TAKE 1 TABLET(20 MG) BY MOUTH DAILY 90 tablet 1  ? traMADol (ULTRAM) 50 MG tablet Take 50-100 mg by mouth 3 (three) times daily as needed. Takes 50 mg at breakfast and lunch or dinner then 100 mg at bedtime    ? ?No current facility-administered medications on file prior to visit.  ? ? ?Review of Systems: ? ?As per HPI- otherwise negative. ? ? ?Physical Examination: ?Vitals:  ? 11/25/21 1306  ?BP: 120/72  ?Pulse: 66  ?Resp: 18  ?Temp: Marland Kitchen)  97.5 ?F (36.4 ?C)  ?SpO2: 99%  ? ?Vitals:  ? 11/25/21 1306  ?Weight: 162 lb 9.6 oz (73.8 kg)  ?Height: '5\' 5"'$  (1.651 m)  ? ?Body mass index is 27.06 kg/m?. ?Ideal Body Weight: Weight in (lb) to have BMI = 25: 149.9 ? ?GEN: no acute distress.  Mild overweight, looks well ?HEENT: Atraumatic, Normocephalic.  ?Ears and Nose: No external deformity. ?CV: RRR, No M/G/R. No JVD. No thrill. No extra heart sounds. ?PULM: CTA B, no wheezes, crackles, rhonchi. No retractions. No resp. distress. No accessory muscle use. ?ABD: S, NT, ND, +BS. No rebound. No HSM. ?EXTR: No c/c/e ?PSYCH: Normally interactive. Conversant.  ?Patient has some changes of psoriasis on her feet, not severe ? ?Assessment and Plan: ?Frailty ? ?Hearing loss, unspecified hearing loss type, unspecified laterality ? ?Balance disorder ? ?Patient seen today for follow-up.  We discussed a few concerns of hers.  She continues to physical therapy to work on her strength and balance.  Encouraged her to try using a walker as it may give her more confidence. ? ?She would like to have a hearing assessment and possibly hearing aids.  I directed her towards a local audiology provider ? ?Assuming all is well, she will see me in about 4 months ? ?Signed ?Lamar Blinks, MD ? ?

## 2021-11-25 ENCOUNTER — Ambulatory Visit (INDEPENDENT_AMBULATORY_CARE_PROVIDER_SITE_OTHER): Payer: Medicare Other | Admitting: Family Medicine

## 2021-11-25 VITALS — BP 120/72 | HR 66 | Temp 97.5°F | Resp 18 | Ht 65.0 in | Wt 162.6 lb

## 2021-11-25 DIAGNOSIS — R2689 Other abnormalities of gait and mobility: Secondary | ICD-10-CM

## 2021-11-25 DIAGNOSIS — R54 Age-related physical debility: Secondary | ICD-10-CM | POA: Diagnosis not present

## 2021-11-25 DIAGNOSIS — H9190 Unspecified hearing loss, unspecified ear: Secondary | ICD-10-CM

## 2021-11-25 NOTE — Patient Instructions (Signed)
It was good to see you again today- please let me know if I can do anything to help ?For hearing aids - please call ?[facility] and schedule an appt ?[phone] ? [phone] ?

## 2021-11-28 ENCOUNTER — Ambulatory Visit (INDEPENDENT_AMBULATORY_CARE_PROVIDER_SITE_OTHER): Payer: Medicare Other | Admitting: Gastroenterology

## 2021-11-28 ENCOUNTER — Encounter: Payer: Self-pay | Admitting: Gastroenterology

## 2021-11-28 ENCOUNTER — Other Ambulatory Visit: Payer: Self-pay

## 2021-11-28 VITALS — BP 138/84 | HR 63 | Ht 65.0 in | Wt 165.0 lb

## 2021-11-28 DIAGNOSIS — R109 Unspecified abdominal pain: Secondary | ICD-10-CM | POA: Diagnosis not present

## 2021-11-28 DIAGNOSIS — R10A2 Flank pain, left side: Secondary | ICD-10-CM

## 2021-11-28 DIAGNOSIS — M940 Chondrocostal junction syndrome [Tietze]: Secondary | ICD-10-CM | POA: Diagnosis not present

## 2021-11-28 DIAGNOSIS — K589 Irritable bowel syndrome without diarrhea: Secondary | ICD-10-CM | POA: Diagnosis not present

## 2021-11-28 DIAGNOSIS — K219 Gastro-esophageal reflux disease without esophagitis: Secondary | ICD-10-CM | POA: Diagnosis not present

## 2021-11-28 NOTE — Patient Instructions (Addendum)
If you are age 84 or older, your body mass index should be between 23-30. Your Body mass index is 27.46 kg/m?Marland Kitchen If this is out of the aforementioned range listed, please consider follow up with your Primary Care Provider.  ? ?__________________________________________________________ ? ?The [facility] providers would like to encourage you to use [facility] to communicate with providers for non-urgent requests or questions.  Due to long hold times on the telephone, sending your provider a message by [facility] may be a faster and more efficient way to get a response.  Please allow 48 business hours for a response.  Please remember that this is for non-urgent requests. ? ?Due to recent changes in healthcare laws, you may see the results of your imaging and laboratory studies on MyChart before your provider has had a chance to review them.  We understand that in some cases there may be results that are confusing or concerning to you. Not all laboratory results come back in the same time frame and the provider may be waiting for multiple results in order to interpret others.  Please give [place] 48 hours in order for your provider to thoroughly review all the results before contacting the office for clarification of your results.   ? ?Please purchase the following medications over the counter and take as directed: Benefiber ? ?Follow up as needed. ? ?Thank you for choosing me and [facility]. ? ?Gerrit Heck, D.O.   ? ?

## 2021-11-28 NOTE — Progress Notes (Signed)
? ?Chief Complaint:    Abdominal pain ? ?GI History: 86 y.o. female with a history of chest pain, spinal stenosis with neurogenic claudication (follows at pain management), bladder CA, hypothyroidism, HLD, peripheral neuropathy, initially seen in the GI clinic in 04/2020 for evaluation of loose, nonbloody stools (but not watery diarrhea) w/ urgency.  Symptoms started following spine surgery approximately 5 weeks earlier, and was improving with dietary modifications alone at the time of initial appointment. ?  ?History of IBS-D.  Previously followed with a Copywriter, advertising in [place] for a longstanding history of loose, irregular stools, but described as non-bothersome to patient.  Essentially well-controlled with dietary modification alone.  Last colonoscopy 2019 in [place]. ?  ?Longstanding history of reflux, well controlled with Prevacid BID and misoprostol (started in 2019 for gastritis on EGD), which she has been taking for years prescribed by her previous GI.  Worse with coffee and overeating.  Regurgitation with forward flexion. EGD 2019 in [place]. ?  ?Endoscopic History ?-EGD (02/22/2018, Dr. Devoria Glassing, MD): Abnormal esophageal motility compatible with presbyesophagus,  normal esophageal mucosa, acute gastritis at GE junction, small sliding 2 cm hiatal hernia, bile induced gastritis with fluid in antrum. (path: non-H pylori gastritis) ?-Colonoscopy (02/22/2018, Dr. Devoria Glassing, MD): Normal ? ?HPI:   ? ? ?Patient is a 86 y.o. female presenting to the Gastroenterology Clinic for follow-up.  Last seen by me on 05/08/2021.  Was doing well at that time.  Reflux is well controlled and IBS-D well controlled as above.  IDA was responsive to IV iron. ? ?Continues to follow with Pain Management for spinal stenosis/neurogenic claudication.  Was last seen by Dr. Edilia Bo earlier this week.  Was last seen in the Hematology clinic on 10/28/2021; had normal CBC and iron indices at that time, with plan for  yearly follow-up. ? ?Today, her main issue is of abdominal pain. Occurs only after forward flexion and extending her torso back up again.  Located on the left flank just below costal margin.  Symptoms last 1 minute then resolved. "feels like velco" under the skin. Ongoing for many years and unchanged. No associated n/v/d/c/f/c.  ? ? ?No recent abdominal imaging for review. ? ?CBC Latest Ref Rng & Units 10/28/2021 04/29/2021 01/22/2021  ?WBC 4.0 - 10.5 K/uL 4.6 4.6 4.5  ?Hemoglobin 12.0 - 15.0 g/dL 12.0 12.2 12.5  ?Hematocrit 36.0 - 46.0 % 38.1 37.5 38.9  ?Platelets 150 - 400 K/uL 217 180 198  ? ? ? ?Review of systems:     No chest pain, no SOB, no fevers, no urinary sx  ? ?Past Medical History:  ?Diagnosis Date  ? Allergy   ? Asthma   ? Cancer [facility])   ? Cataract   ? DDD (degenerative disc disease), lumbar 06/01/2017  ? Depression   ? GERD (gastroesophageal reflux disease)   ? Heart murmur   ? Hyperlipidemia   ? IDA (iron deficiency anemia) 05/31/2020  ? Peripheral neuropathy 04/30/2017  ? Thyroid disease   ? ? ?Patient's surgical history, family medical history, social history, medications and allergies were all reviewed in Epic  ? ? ?Current Outpatient Medications  ?Medication Sig Dispense Refill  ? acitretin (SORIATANE) 25 MG capsule Take by mouth.    ? Cholecalciferol (VITAMIN D-3 PO) Take 1,000 Units by mouth daily.    ? famotidine (PEPCID) 20 MG tablet Take 1 tablet (20 mg total) by mouth at bedtime. 90 tablet 3  ? gabapentin (NEURONTIN) 300 MG capsule Take 1 capsule (300 mg total) by  mouth 3 (three) times daily. 90 capsule 4  ? lansoprazole (PREVACID) 30 MG capsule TAKE 1 CAPSULE(30 MG) BY MOUTH TWICE DAILY BEFORE A MEAL 180 capsule 1  ? levothyroxine (SYNTHROID) 88 MCG tablet TAKE 1 TABLET(88 MCG) BY MOUTH DAILY BEFORE AND BREAKFAST 90 tablet 1  ? misoprostol (CYTOTEC) 100 MCG tablet TAKE 2 TABLETS BY MOUTH AFTER BREAKFAST AND AFTER DINNER AS DIRECTED 120 tablet 3  ? mupirocin ointment (BACTROBAN) 2 % Apply 1  application topically 2 (two) times daily. Use as needed for nose sores 22 g 0  ? simvastatin (ZOCOR) 20 MG tablet TAKE 1 TABLET(20 MG) BY MOUTH DAILY 90 tablet 1  ? traMADol (ULTRAM) 50 MG tablet Take 50 mg by mouth 3 (three) times daily.    ? ?No current facility-administered medications for this visit.  ? ? ?Physical Exam:   ? ? ?BP 138/84   Pulse 63   Ht '5\' 5"'$  (1.651 m)   Wt 165 lb (74.8 kg)   BMI 27.46 kg/m?  ? ?GENERAL:  Pleasant female in NAD ?PSYCH: : Cooperative, normal affect ?ABDOMEN: TTP along tip of rib 11/12 and left subcostal margin.  Otherwise abdomen nondistended, soft, nontender.  ?Musculoskeletal: Walks with cane assist ?NEURO: Alert and oriented x 3, no focal neurologic deficits ? ? ?IMPRESSION and PLAN:   ? ?1) Left flank pain ?- Has MSK pain, suspected to be costochondritis and/or rib tip syndrome.  Plan for conservative management ?- Okay to use heating pad ?- No suspicion for GI pathology based on description and clinical exam ? ?2) GERD ?3) Nonulcer dyspepsia ?- Symptoms well controlled on current therapy ?- Continue antireflux lifestyle/dietary modifications ? ?4) IBS ?- Largely well-controlled on current conservative management.  Occasional loose stools, but not diarrhea or increased stool frequency ?- Add Benefiber prn ?- Continue high-fiber diet ? ? ?RTC prn  ?    ?    ? ?Lavena Bullion ,DO, FACG 11/28/2021, 11:44 AM ? ?

## 2021-12-10 DIAGNOSIS — M25559 Pain in unspecified hip: Secondary | ICD-10-CM | POA: Diagnosis not present

## 2021-12-12 DIAGNOSIS — M25559 Pain in unspecified hip: Secondary | ICD-10-CM | POA: Diagnosis not present

## 2021-12-16 ENCOUNTER — Encounter: Payer: Self-pay | Admitting: Family Medicine

## 2021-12-17 DIAGNOSIS — M25552 Pain in left hip: Secondary | ICD-10-CM | POA: Diagnosis not present

## 2021-12-17 DIAGNOSIS — M25551 Pain in right hip: Secondary | ICD-10-CM | POA: Diagnosis not present

## 2021-12-19 ENCOUNTER — Encounter: Payer: Medicare Other | Attending: Psychology | Admitting: Psychology

## 2021-12-19 DIAGNOSIS — F341 Dysthymic disorder: Secondary | ICD-10-CM

## 2021-12-19 DIAGNOSIS — G8929 Other chronic pain: Secondary | ICD-10-CM

## 2021-12-19 DIAGNOSIS — M25551 Pain in right hip: Secondary | ICD-10-CM | POA: Diagnosis not present

## 2021-12-19 DIAGNOSIS — F411 Generalized anxiety disorder: Secondary | ICD-10-CM

## 2021-12-19 DIAGNOSIS — M25552 Pain in left hip: Secondary | ICD-10-CM | POA: Diagnosis not present

## 2021-12-20 ENCOUNTER — Other Ambulatory Visit: Payer: Self-pay | Admitting: Gastroenterology

## 2021-12-24 DIAGNOSIS — M25552 Pain in left hip: Secondary | ICD-10-CM | POA: Diagnosis not present

## 2021-12-24 DIAGNOSIS — M25551 Pain in right hip: Secondary | ICD-10-CM | POA: Diagnosis not present

## 2021-12-25 DIAGNOSIS — H18523 Epithelial (juvenile) corneal dystrophy, bilateral: Secondary | ICD-10-CM | POA: Diagnosis not present

## 2021-12-25 DIAGNOSIS — H35362 Drusen (degenerative) of macula, left eye: Secondary | ICD-10-CM | POA: Diagnosis not present

## 2021-12-25 DIAGNOSIS — H04123 Dry eye syndrome of bilateral lacrimal glands: Secondary | ICD-10-CM | POA: Diagnosis not present

## 2021-12-25 DIAGNOSIS — H35373 Puckering of macula, bilateral: Secondary | ICD-10-CM | POA: Diagnosis not present

## 2021-12-26 DIAGNOSIS — M25551 Pain in right hip: Secondary | ICD-10-CM | POA: Diagnosis not present

## 2021-12-26 DIAGNOSIS — M25552 Pain in left hip: Secondary | ICD-10-CM | POA: Diagnosis not present

## 2021-12-30 DIAGNOSIS — M25551 Pain in right hip: Secondary | ICD-10-CM | POA: Diagnosis not present

## 2021-12-30 DIAGNOSIS — M25552 Pain in left hip: Secondary | ICD-10-CM | POA: Diagnosis not present

## 2022-01-06 ENCOUNTER — Encounter: Payer: Self-pay | Admitting: Family Medicine

## 2022-01-06 MED ORDER — SIMVASTATIN 20 MG PO TABS: ORAL_TABLET | ORAL | 3 refills | Status: DC
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
## 2022-01-21 ENCOUNTER — Ambulatory Visit (INDEPENDENT_AMBULATORY_CARE_PROVIDER_SITE_OTHER): Payer: Medicare Other | Admitting: Family

## 2022-01-21 ENCOUNTER — Encounter: Payer: Self-pay | Admitting: Family

## 2022-01-21 VITALS — BP 138/80 | HR 68 | Temp 97.9°F | Ht 63.0 in | Wt 164.2 lb

## 2022-01-21 DIAGNOSIS — T148XXA Other injury of unspecified body region, initial encounter: Secondary | ICD-10-CM | POA: Diagnosis not present

## 2022-01-21 DIAGNOSIS — M791 Myalgia, unspecified site: Secondary | ICD-10-CM | POA: Diagnosis not present

## 2022-01-21 MED ORDER — MUPIROCIN 2 % EX OINT: 1.0000 "application " | TOPICAL_OINTMENT | Freq: Two times a day (BID) | CUTANEOUS | 0 refills | Status: DC

## 2022-01-21 NOTE — Progress Notes (Signed)
[name] is a 86 y.o. female with the following history as recorded in EpicCare:  ?Patient Active Problem List  ? Diagnosis Date Noted  ? Thyroid disease   ? Heart murmur   ? GERD (gastroesophageal reflux disease)   ? Depression   ? Cataract   ? Cancer [facility])   ? Asthma   ? Allergy   ? IDA (iron deficiency anemia) 05/31/2020  ? Osteopenia 05/30/2019  ? Pedal edema 01/31/2018  ? Squamous cell carcinoma of skin of left lower limb, including hip 12/16/2017  ? Plantar fasciitis 07/07/2017  ? DDD (degenerative disc disease), lumbar 06/01/2017  ? Fibromyalgia 06/01/2017  ? Spinal stenosis at L4-L5 level 04/30/2017  ? Peripheral neuropathy 04/30/2017  ? Encounter for therapeutic drug monitoring 03/05/2017  ? Bladder cancer ([place]) 08/20/2015  ? Arthropathy of lumbar facet joint 07/27/2015  ? Spinal stenosis of cervical region 07/27/2015  ? Spondylolisthesis 07/27/2015  ? Hypothyroid 07/05/2015  ? Abdominal pain, acute, left upper quadrant 06/28/2015  ? Basal cell carcinoma, face 06/28/2015  ? Bilateral groin pain 06/28/2015  ? Chronic pain 06/28/2015  ? Fatigue 06/28/2015  ? Hyperlipidemia 06/28/2015  ? Bilateral hip pain 06/28/2015  ? Paresthesia of lower lip 06/28/2015  ? Pulmonary nodule 06/28/2015  ? Pustulosis palmaris et plantaris 06/28/2015  ? Ulcer of nose 06/28/2015  ? 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  ? Mass of parotid gland 03/24/2014  ?  ?Current Outpatient Medications  ?Medication Sig Dispense Refill  ? mupirocin ointment (BACTROBAN) 2 % Apply 1 application. topically 2 (two) times daily. 22 g 0  ? acitretin (SORIATANE) 25 MG capsule Take by mouth.    ? Cholecalciferol (VITAMIN D-3 PO) Take 1,000 Units by mouth daily.    ? famotidine (PEPCID) 20 MG tablet Take 1 tablet (20 mg total) by mouth at bedtime. 90 tablet 3  ? gabapentin (NEURONTIN) 300 MG capsule Take 1 capsule (300 mg total) by mouth 3 (three) times daily. 90 capsule 4  ? lansoprazole (PREVACID) 30 MG capsule TAKE 1 CAPSULE(30 MG) BY MOUTH TWICE DAILY  BEFORE A MEAL 180 capsule 1  ? levothyroxine (SYNTHROID) 88 MCG tablet TAKE 1 TABLET(88 MCG) BY MOUTH DAILY BEFORE AND BREAKFAST 90 tablet 1  ? misoprostol (CYTOTEC) 100 MCG tablet TAKE 2 TABLETS BY MOUTH AFTER BREAKFAST AND AFTER DINNER AS DIRECTED 120 tablet 3  ? mupirocin ointment (BACTROBAN) 2 % Apply 1 application topically 2 (two) times daily. Use as needed for nose sores 22 g 0  ? simvastatin (ZOCOR) 20 MG tablet TAKE 1 TABLET(20 MG) BY MOUTH DAILY 90 tablet 3  ? traMADol (ULTRAM) 50 MG tablet Take 50 mg by mouth 3 (three) times daily.    ? ?No current facility-administered medications for this visit.  ?  ?Allergies: Contrast media [iodinated contrast media], Oxycodone, Latex, Mirtazapine, Acitretin, Cephalexin, Elemental sulfur, Penicillins, and Sulfa antibiotics  ?Past Medical History:  ?Diagnosis Date  ? Allergy   ? Asthma   ? Cancer [facility])   ? Cataract   ? DDD (degenerative disc disease), lumbar 06/01/2017  ? Depression   ? GERD (gastroesophageal reflux disease)   ? Heart murmur   ? Hyperlipidemia   ? IDA (iron deficiency anemia) 05/31/2020  ? Peripheral neuropathy 04/30/2017  ? Thyroid disease   ?  ?Past Surgical History:  ?Procedure Laterality Date  ? bladder cancer  2013  ? CARPAL TUNNEL RELEASE  2015  ? CATARACT EXTRACTION Bilateral 2012  ? CHOLECYSTECTOMY    ? COLONOSCOPY WITH ESOPHAGOGASTRODUODENOSCOPY (EGD)  02/22/2018  ?  [place] [facility]  ? SALIVARY GLAND SURGERY Left 2015  ? benign  ? SPINE SURGERY  6/31/21  ? vertaflex  ? TONSILLECTOMY AND ADENOIDECTOMY  1943  ?  ?Family History  ?Problem Relation Age of Onset  ? Macular degeneration Mother   ? Hyperlipidemia Father   ? Stroke Father   ? Cancer Brother   ?     ? stomach  ? Prostate cancer Other   ?     in brothers  ? Colon cancer Neg Hx   ?  ?Social History  ? ?Tobacco Use  ? Smoking status: Former  ?  Types: Cigarettes  ?  Quit date: 2015  ?  Years since quitting: 8.3  ? Smokeless tobacco: Never  ?Substance Use Topics  ?  Alcohol use: No  ?  ?Subjective:  ?Concerned about small cut on back of right leg; unsure of source of cut; notes that area started today;  ?Also mentions that she is having more and more joint pains/ feeling stiff; wonders if any of her medications could be contributing;  ? ? ? ? ?Objective:  ?Vitals:  ? 01/21/22 1454  ?BP: 138/80  ?Pulse: 68  ?Temp: 97.9 ?F (36.6 ?C)  ?TempSrc: Oral  ?SpO2: 97%  ?Weight: 164 lb 3.2 oz (74.5 kg)  ?Height: '5\' 3"'$  (1.6 m)  ?  ?General: Well developed, well nourished, in no acute distress  ?Skin : Warm and dry. Small superficial scratch noted on back of right ankle ?Head: Normocephalic and atraumatic  ?Lungs: Respirations unlabored;  ?Neurologic: Alert and oriented; speech intact; face symmetrical;  ? ?Assessment:  ?1. Scratch   ?2. Myalgia   ?  ?Plan:  ?Reassurance; continue to monitor and keep dry and clean; refill on Bactroban;  ?Patient will try holding Simvastatin for now and check with her PCP in 2 weeks with response; she will also plan to talk to her dermatologist about side effects of acitretin.  ? ?No follow-ups on file.  ?No orders of the defined types were placed in this encounter. ?  ?Requested Prescriptions  ? ?Signed Prescriptions Disp Refills  ? mupirocin ointment (BACTROBAN) 2 % 22 g 0  ?  Sig: Apply 1 application. topically 2 (two) times daily.  ?  ? ?

## 2022-01-21 NOTE — Patient Instructions (Signed)
Please hold your Simvastatin as we discussed. Please check with Dr. Lorelei Pont with your response in about 2 weeks.  ?

## 2022-01-23 ENCOUNTER — Encounter: Payer: Medicare Other | Attending: Psychology | Admitting: Psychology

## 2022-01-23 DIAGNOSIS — Z6829 Body mass index (BMI) 29.0-29.9, adult: Secondary | ICD-10-CM | POA: Insufficient documentation

## 2022-01-23 DIAGNOSIS — M79604 Pain in right leg: Secondary | ICD-10-CM | POA: Insufficient documentation

## 2022-01-23 DIAGNOSIS — R21 Rash and other nonspecific skin eruption: Secondary | ICD-10-CM | POA: Insufficient documentation

## 2022-01-23 DIAGNOSIS — R197 Diarrhea, unspecified: Secondary | ICD-10-CM | POA: Diagnosis not present

## 2022-01-23 DIAGNOSIS — F32A Depression, unspecified: Secondary | ICD-10-CM | POA: Diagnosis not present

## 2022-01-23 DIAGNOSIS — R200 Anesthesia of skin: Secondary | ICD-10-CM | POA: Diagnosis not present

## 2022-01-23 DIAGNOSIS — F429 Obsessive-compulsive disorder, unspecified: Secondary | ICD-10-CM | POA: Insufficient documentation

## 2022-01-23 DIAGNOSIS — R450 Nervousness: Secondary | ICD-10-CM | POA: Diagnosis not present

## 2022-01-23 DIAGNOSIS — K6389 Other specified diseases of intestine: Secondary | ICD-10-CM | POA: Diagnosis not present

## 2022-01-23 DIAGNOSIS — R262 Difficulty in walking, not elsewhere classified: Secondary | ICD-10-CM | POA: Diagnosis not present

## 2022-01-23 DIAGNOSIS — G8929 Other chronic pain: Secondary | ICD-10-CM

## 2022-01-23 DIAGNOSIS — F341 Dysthymic disorder: Secondary | ICD-10-CM | POA: Diagnosis not present

## 2022-01-23 DIAGNOSIS — Z7182 Exercise counseling: Secondary | ICD-10-CM | POA: Insufficient documentation

## 2022-01-23 DIAGNOSIS — M79605 Pain in left leg: Secondary | ICD-10-CM | POA: Insufficient documentation

## 2022-01-23 DIAGNOSIS — N3289 Other specified disorders of bladder: Secondary | ICD-10-CM | POA: Diagnosis not present

## 2022-01-23 DIAGNOSIS — G894 Chronic pain syndrome: Secondary | ICD-10-CM | POA: Diagnosis not present

## 2022-01-23 DIAGNOSIS — G4733 Obstructive sleep apnea (adult) (pediatric): Secondary | ICD-10-CM | POA: Diagnosis not present

## 2022-01-23 DIAGNOSIS — M545 Low back pain, unspecified: Secondary | ICD-10-CM | POA: Insufficient documentation

## 2022-01-23 DIAGNOSIS — M79651 Pain in right thigh: Secondary | ICD-10-CM | POA: Diagnosis not present

## 2022-01-23 DIAGNOSIS — G4700 Insomnia, unspecified: Secondary | ICD-10-CM | POA: Insufficient documentation

## 2022-01-23 DIAGNOSIS — R531 Weakness: Secondary | ICD-10-CM | POA: Insufficient documentation

## 2022-01-23 DIAGNOSIS — Z713 Dietary counseling and surveillance: Secondary | ICD-10-CM | POA: Diagnosis not present

## 2022-01-23 DIAGNOSIS — F411 Generalized anxiety disorder: Secondary | ICD-10-CM | POA: Diagnosis not present

## 2022-01-23 DIAGNOSIS — M79652 Pain in left thigh: Secondary | ICD-10-CM | POA: Insufficient documentation

## 2022-01-23 DIAGNOSIS — Z9181 History of falling: Secondary | ICD-10-CM | POA: Insufficient documentation

## 2022-01-27 ENCOUNTER — Encounter (HOSPITAL_BASED_OUTPATIENT_CLINIC_OR_DEPARTMENT_OTHER): Payer: Medicare Other | Admitting: Physical Medicine and Rehabilitation

## 2022-01-27 ENCOUNTER — Encounter: Payer: Self-pay | Admitting: Physical Medicine and Rehabilitation

## 2022-01-27 ENCOUNTER — Other Ambulatory Visit: Payer: Self-pay | Admitting: Psychology

## 2022-01-27 VITALS — BP 154/76 | HR 63 | Ht 63.0 in | Wt 164.8 lb

## 2022-01-27 DIAGNOSIS — F341 Dysthymic disorder: Secondary | ICD-10-CM | POA: Diagnosis not present

## 2022-01-27 DIAGNOSIS — M79604 Pain in right leg: Secondary | ICD-10-CM | POA: Diagnosis not present

## 2022-01-27 DIAGNOSIS — G4733 Obstructive sleep apnea (adult) (pediatric): Secondary | ICD-10-CM | POA: Diagnosis not present

## 2022-01-27 DIAGNOSIS — G8929 Other chronic pain: Secondary | ICD-10-CM

## 2022-01-27 DIAGNOSIS — M79605 Pain in left leg: Secondary | ICD-10-CM | POA: Diagnosis not present

## 2022-01-27 DIAGNOSIS — F32A Depression, unspecified: Secondary | ICD-10-CM | POA: Diagnosis not present

## 2022-01-27 DIAGNOSIS — F411 Generalized anxiety disorder: Secondary | ICD-10-CM | POA: Diagnosis not present

## 2022-01-27 MED ORDER — LIDOCAINE 5 % EX PTCH: 1.0000 | MEDICATED_PATCH | CUTANEOUS | 0 refills | Status: DC

## 2022-01-27 NOTE — Patient Instructions (Addendum)
Foods that may reduce pain: ?1) Ginger (especially studied for arthritis)- reduce leukotriene production to decrease inflammation ?2) Blueberries- high in phytonutrients that decrease inflammation ?3) Salmon- marine omega-3s reduce joint swelling and pain ?4) Pumpkin seeds- reduce inflammation ?5) dark chocolate- reduces inflammation ?6) turmeric- reduces inflammation ?7) tart cherries - reduce pain and stiffness ?8) extra virgin olive oil - its compound olecanthal helps to block prostaglandins  ?9) chili peppers- can be eaten or applied topically via capsaicin ?10) mint- helpful for headache, muscle aches, joint pain, and itching ?11) garlic- reduces inflammation ? ?Link to further information on diet for chronic pain: http://www.randall.com/  ? ? ? ?-recommended doTerra Deep Blue Essential oil and applied to area of pain today- discussed that this is made of natural plant oils. Shared by personal experience of benefit from use of this essential oil ?-provided a link to a pdf of Pete Escogue's musculoskeletal alignment exercises: https://www.berger.[email or web address]  ?

## 2022-01-27 NOTE — Progress Notes (Signed)
? ?Subjective:  ? ? Patient ID: Mary Buck, female    DOB: 07-22-1936, 86 y.o.   MRN: [id number] ? ?HPI ?Mrs. Rosner is an 86 year old woman who presents with 1-2 weeks of pain running down her thighs into her knees. The average pain is 7/10, pain right now is 8/10. The pain feels sharp, burning, stabbing, and aching. If she walks too long she gets pain in her lower back. Then she needs to rest and the pain goes away. When the pain is severe in her back at night, she takes an extra gabapentin. She feels too tried during the day. She has not tried Lyrica and Cymbalta.  ? ?Pain Inventory ?Average Pain 7 ?Pain Right Now 8 ?My pain is sharp, burning, stabbing, and aching ? ?In the last 24 hours, has pain interfered with the following? ?General activity 8 ?Relation with others 8 ?Enjoyment of life 8 ?What TIME of day is your pain at its worst? night ?Sleep (in general) Poor ? ?Pain is worse with: standing ?Pain improves with: medication ?Relief from Meds: 5 ? ?use a cane ?how many minutes can you walk? 10-15 ?ability to climb steps?  yes ?do you drive?  yes ?Do you have any goals in this area?  yes ? ?retired ?Do you have any goals in this area?  yes ? ?bladder control problems ?bowel control problems ?weakness ?numbness ?trouble walking ?depression ?anxiety ? ?Any changes since last visit?  no ? ?Any changes since last visit?  no ? ? ? ?Family History  ?Problem Relation Age of Onset  ? Macular degeneration Mother   ? Hyperlipidemia Father   ? Stroke Father   ? Cancer Brother   ?     ? stomach  ? Prostate cancer Other   ?     in brothers  ? Colon cancer Neg Hx   ? ?Social History  ? ?Socioeconomic History  ? Marital status: Significant Other  ?  Spouse name: Not on file  ? Number of children: 0  ? Years of education: 29  ? Highest education level: Not on file  ?Occupational History  ? Occupation: retired  ?Tobacco Use  ? Smoking status: Former  ?  Types: Cigarettes  ?  Quit date: 2015  ?  Years since quitting: 8.3   ? Smokeless tobacco: Never  ?Vaping Use  ? Vaping Use: Never used  ?Substance and Sexual Activity  ? Alcohol use: No  ? Drug use: No  ? Sexual activity: Not on file  ?Other Topics Concern  ? Not on file  ?Social History Narrative  ? Not on file  ? ?Social Determinants of Health  ? ?Financial Resource Strain: Low Risk   ? Difficulty of Paying Living Expenses: Not hard at all  ?Food Insecurity: No Food Insecurity  ? Worried About Charity fundraiser in the Last Year: Never true  ? Ran Out of Food in the Last Year: Never true  ?Transportation Needs: No Transportation Needs  ? Lack of Transportation (Medical): No  ? Lack of Transportation (Non-Medical): No  ?Physical Activity: Insufficiently Active  ? Days of Exercise per Week: 7 days  ? Minutes of Exercise per Session: 20 min  ?Stress: Stress Concern Present  ? Feeling of Stress : To some extent  ?Social Connections: Moderately Isolated  ? Frequency of Communication with Friends and Family: Once a week  ? Frequency of Social Gatherings with Friends and Family: Once a week  ? Attends Religious Services: More  than 4 times per year  ? Active Member of Clubs or Organizations: No  ? Attends Archivist Meetings: Never  ? Marital Status: Living with partner  ? ?Past Surgical History:  ?Procedure Laterality Date  ? bladder cancer  2013  ? CARPAL TUNNEL RELEASE  2015  ? CATARACT EXTRACTION Bilateral 2012  ? CHOLECYSTECTOMY    ? COLONOSCOPY WITH ESOPHAGOGASTRODUODENOSCOPY (EGD)  02/22/2018  ? [place] at [facility]  ? SALIVARY GLAND SURGERY Left 2015  ? benign  ? SPINE SURGERY  6/31/21  ? vertaflex  ? TONSILLECTOMY AND ADENOIDECTOMY  1943  ? ?Past Medical History:  ?Diagnosis Date  ? Allergy   ? Asthma   ? Cancer [facility])   ? Cataract   ? DDD (degenerative disc disease), lumbar 06/01/2017  ? Depression   ? GERD (gastroesophageal reflux disease)   ? Heart murmur   ? Hyperlipidemia   ? IDA (iron deficiency anemia) 05/31/2020  ? Peripheral neuropathy  04/30/2017  ? Thyroid disease   ? ?BP (!) 154/76   Pulse 63   Ht '5\' 3"'$  (1.6 m)   Wt 164 lb 12.8 oz (74.8 kg)   SpO2 94%   BMI 29.19 kg/m?  ? ?Opioid Risk Score:   ?Fall Risk Score:  `1 ? ?Depression screen PHQ 2/9 ? ? ?  01/27/2022  ?  2:29 PM 01/21/2022  ?  2:54 PM 07/30/2021  ?  2:39 PM 04/11/2021  ?  3:14 PM 03/28/2020  ?  2:16 PM 04/30/2017  ?  3:25 PM  ?Depression screen PHQ 2/9  ?Decreased Interest 3 0 0 3 0 0  ?Down, Depressed, Hopeless 3 0 1 2 0 0  ?PHQ - 2 Score 6 0 1 5 0 0  ?Altered sleeping    2    ?Tired, decreased energy    3    ?Change in appetite    1    ?Feeling bad or failure about yourself     2    ?Trouble concentrating    2    ?Moving slowly or fidgety/restless    1    ?Suicidal thoughts    0    ?PHQ-9 Score    16    ?Difficult doing work/chores    Somewhat difficult    ?  ? ?Review of Systems  ?Constitutional: Negative.   ?HENT: Negative.    ?Eyes: Negative.   ?Respiratory: Negative.    ?Cardiovascular: Negative.   ?Gastrointestinal:  Positive for diarrhea.  ?Endocrine: Negative.   ?Genitourinary:  Positive for difficulty urinating.  ?Musculoskeletal:  Positive for back pain and gait problem.  ?Skin:  Positive for rash.  ?Allergic/Immunologic: Negative.   ?Neurological:  Positive for weakness and numbness.  ?Hematological:  Bruises/bleeds easily.  ?Psychiatric/Behavioral:  Positive for dysphoric mood and sleep disturbance. The patient is nervous/anxious.   ? ?   ?Objective:  ? Physical Exam ?Gen: no distress, normal appearing ?HEENT: oral mucosa pink and moist, NCAT ?Cardio: Reg rate ?Chest: normal effort, normal rate of breathing ?Abd: soft, non-distended ?Ext: no edema ?Psych: pleasant, normal affect ?Skin: burn on left leg, well healed ?Neuro: +slump test bilaterally. Decreased sensation on left lower extremity ?   ?Assessment & Plan:  ? ?1) Chronic Pain Syndrome secondary to bilateral lower extremity thigh pain, low back pain ?-Discussed current symptoms of pain and history of pain.   ?-Discussed benefits of exercise in reducing pain. ?-Discussed following foods that may reduce pain: ?1) Ginger (especially studied for arthritis)- reduce leukotriene  production to decrease inflammation ?2) Blueberries- high in phytonutrients that decrease inflammation ?3) Salmon- marine omega-3s reduce joint swelling and pain ?4) Pumpkin seeds- reduce inflammation ?5) dark chocolate- reduces inflammation ?6) turmeric- reduces inflammation ?7) tart cherries - reduce pain and stiffness ?8) extra virgin olive oil - its compound olecanthal helps to block prostaglandins  ?9) chili peppers- can be eaten or applied topically via capsaicin ?10) mint- helpful for headache, muscle aches, joint pain, and itching ?11) garlic- reduces inflammation ? ?Link to further information on diet for chronic pain: http://www.randall.com/  ? ?-discussed gabapentin, Lyrica ?-recommended doTerra Deep Blue Essential oil and applied to area of pain today- discussed that this is made of natural plant oils. Shared by personal experience of benefit from use of this essential oil ?-provided a link to a pdf of Pete Escogue's musculoskeletal alignment exercises: https://www.berger.biz/.pdf  ? ?-continue lidocaine patch.  ? ?2) History of fall ?-continue cane ?-continue PT ?

## 2022-01-28 ENCOUNTER — Ambulatory Visit (INDEPENDENT_AMBULATORY_CARE_PROVIDER_SITE_OTHER): Payer: Medicare Other | Admitting: Family Medicine

## 2022-01-28 ENCOUNTER — Encounter: Payer: Self-pay | Admitting: Family Medicine

## 2022-01-28 VITALS — BP 117/52 | HR 72 | Ht 63.0 in | Wt 165.5 lb

## 2022-01-28 DIAGNOSIS — G4733 Obstructive sleep apnea (adult) (pediatric): Secondary | ICD-10-CM

## 2022-01-28 DIAGNOSIS — M25551 Pain in right hip: Secondary | ICD-10-CM | POA: Diagnosis not present

## 2022-01-28 DIAGNOSIS — Z9989 Dependence on other enabling machines and devices: Secondary | ICD-10-CM

## 2022-01-28 DIAGNOSIS — M25552 Pain in left hip: Secondary | ICD-10-CM | POA: Diagnosis not present

## 2022-01-28 NOTE — Patient Instructions (Addendum)
Untreated obstructive sleep apnea when it is moderate to severe can have an adverse impact on cardiovascular health and raise her risk for heart disease, arrhythmias, hypertension, congestive heart failure, stroke and diabetes. Untreated obstructive sleep apnea causes sleep disruption, nonrestorative sleep, and sleep deprivation. This can have an impact on your day to day functioning and cause daytime sleepiness and impairment of cognitive function, memory loss, mood disturbance, and problems focussing. Using CPAP regularly can improve these symptoms. You could also consider talking to your dentist regarding an oral appliance or consideration of Inspire device.  ? ? ?Follow up with [place] as needed ? ? ?Management of Memory Problems ?  ?There are some general things you can do to help manage your memory problems.  Your memory may not in fact recover, but by using techniques and strategies you will be able to manage your memory difficulties better. ?  ?1)  Establish a routine. ?Try to establish and then stick to a regular routine.  By doing this, you will get used to what to expect and you will reduce the need to rely on your memory.  Also, try to do things at the same time of day, such as taking your medication or checking your calendar first thing in the morning. ?Think about think that you can do as a part of a regular routine and make a list.  Then enter them into a daily planner to remind you.  This will help you establish a routine. ?  ?2)  Organize your environment. ?Organize your environment so that it is uncluttered.  Decrease visual stimulation.  Place everyday items such as keys or cell phone in the same place every day (ie.  Basket next to front door) ?Use post it notes with a brief message to yourself (ie. Turn off light, lock the door) ?Use labels to indicate where things go (ie. Which cupboards are for food, dishes, etc.) ?Keep a notepad and pen by the telephone to take messages ?  ?3)  Memory Aids ?A  diary or journal/notebook/daily planner ?Making a list (shopping list, chore list, to do list that needs to be done) ?Using an alarm as a reminder (kitchen timer or cell phone alarm) ?Using cell phone to store information (Notes, Calendar, Reminders) ?Calendar/White board placed in a prominent position ?Post-it notes ?  ?In order for memory aids to be useful, you need to have good habits.  It's no good remembering to make a note in your journal if you don't remember to look in it.  Try setting aside a certain time of day to look in journal. ?  ?4)  Improving mood and managing fatigue. ?There may be other factors that contribute to memory difficulties.  Factors, such as anxiety, depression and tiredness can affect memory. ?Regular gentle exercise can help improve your mood and give you more energy. ?Simple relaxation techniques may help relieve symptoms of anxiety ?Try to get back to completing activities or hobbies you enjoyed doing in the past. ?Learn to pace yourself through activities to decrease fatigue. ?Find out about some local support groups where you can share experiences with others. ?Try and achieve 7-8 hours of sleep at night. ? ?

## 2022-01-28 NOTE — Progress Notes (Signed)
? ? ?PATIENT: Mary Buck ?DOB: [birth year] ? ?REASON FOR VISIT: follow up ?HISTORY FROM: patient ? ?Chief Complaint  ?Patient presents with  ? Obstructive Sleep Apnea  ?  Rm 1, alone. Here for 6 month CPAP f/u. Pt has returned CPAP back to [employer]. Pt was unable to tolerate, was not able to stay still at night to keep it on and sleep comfortably.   ?  ? ?HISTORY OF PRESENT ILLNESS: ? ?01/28/22 ALL:  ?Mary Buck is a 86 y.o. female here today for follow up for OSA on CPAP. She was last seen by Dr Rexene Alberts in 07/2021 and was having difficulty tolerating CPAP therapy. She was encouraged to continue usage and reports using CPAP for a couple of weeks following but ultimately decided to return her machine. She did not feel that CPAP was beneficial at all. She felt that sleep was disrupted when using therapy. She is adamant that she is not interested in treating sleep apnea at this time.  ? ?She continues to follow with Dr Sima Matas for counseling. Neurocognitive eval was unremarkable for neurodegenerative memory disorder. She feels that mood is good. She is sleeping fairly well. She admits that she should cut back on caffeine but not interested.  ? ?HISTORY: (copied from Dr Guadelupe Sabin previous note) ? 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  The patient is unaccompanied today.  I last saw her on 04/10/2021, at which time she reported feeling reluctant to start AutoPap therapy.  She used it 1 time at home and had a runny days that lasted for several days.  She had returned the machine.  She  was encouraged to restart a trial of AutoPap therapy for her moderate obstructive sleep apnea.  A home sleep test in December 2021 had shown an AHI of 26.4/h, O2 nadir 77%.  She had reported short-term memory issues in November 2021.  She was advised that memory issues and sleep apnea can correlate. ?  ?Her set up date was 06/06/2021.  She has an air sense 11 AutoSet from [employer]. ?  ?Today, 07/31/2021: I reviewed her autoPap compliance data from 07/01/2021 through 07/30/2021, which is a total of 30 days, during which time she used her machine every night with percent used days greater than 4 hours at 70%, indicating adequate compliance with an average usage of 4 hours and 59 minutes, residual AHI at goal at 2.8/h, average pressure for the 95th percentile at 8.9 cm with a range of 4 cm to 10 cm, 95th percentile of pressure elevated at 37.5 L/min.  She reports still having a hard time adjusting to treatment.  She is motivated to continue with that but reports discomfort with a nasal mask.  She has not tried a different interface.  She does not know how to adjust the humidifier and has some mouth dryness at times.  She does not feel that she needs a full facemask but would be willing to try a smaller nasal interface.  She has switched from a small nasal mask to medium but feels that the leak is worse or at least unchanged with a medium mask.  She does  endorse daytime somnolence.  Of note, she has been on gabapentin but the dose has not changed.  She is working on changing maybe the timing of the gabapentin to see if her sleepiness improves. ? ?REVIEW OF SYSTEMS: Out of a complete 14 system review of symptoms, the patient complains only of the following symptoms, insomnia and all other reviewed systems are negative. ? ?ESS: 6/24 ? ?ALLERGIES: ?Allergies  ?Allergen Reactions  ? Contrast Media [Iodinated Contrast Media] Anaphylaxis  ? Oxycodone Anaphylaxis  ?  "my throat swells with any opioid"  ? Latex Other (See  Comments)  ?  Other reaction(s): blistering, redness   ? Mirtazapine Nausea Only  ? Acitretin Rash  ? Cephalexin Other (See Comments)  ?  Unknown  ? Elemental Sulfur Other (See Comments)  ?  unknown  ? Penicillins Other (See Comments)  ?  Unknown  ? Sulfa Antibiotics Other (See Comments)  ?  Unknown  ? ? ?HOME MEDICATIONS: ?Outpatient Medications Prior to Visit  ?Medication Sig Dispense Refill  ? acitretin (SORIATANE) 25 MG capsule Take by mouth.    ? Cholecalciferol (VITAMIN D-3 PO) Take 1,000 Units by mouth daily.    ? famotidine (PEPCID) 20 MG tablet Take 1 tablet (20 mg total) by mouth at bedtime. 90 tablet 3  ? gabapentin (NEURONTIN) 300 MG capsule Take 1 capsule (300 mg total) by mouth 3 (three) times daily. 90 capsule 4  ? lansoprazole (PREVACID) 30 MG capsule TAKE 1 CAPSULE(30 MG) BY MOUTH TWICE DAILY BEFORE A MEAL 180 capsule 1  ? levothyroxine (SYNTHROID) 88 MCG tablet TAKE 1 TABLET(88 MCG) BY MOUTH DAILY BEFORE AND BREAKFAST 90 tablet 1  ? lidocaine (LIDODERM) 5 % Place 1 patch onto the skin daily. Remove & Discard patch within 12 hours or as directed by MD 30 patch 0  ? misoprostol (CYTOTEC) 100 MCG tablet TAKE 2 TABLETS BY MOUTH AFTER BREAKFAST AND AFTER DINNER AS DIRECTED 120 tablet 3  ? mupirocin ointment (BACTROBAN) 2 % Apply 1 application. topically 2 (two) times daily. 22 g 0  ? simvastatin (ZOCOR) 20 MG tablet TAKE 1 TABLET(20 MG) BY MOUTH DAILY 90 tablet 3  ? traMADol (ULTRAM) 50 MG tablet Take 50 mg by mouth 3 (three) times daily.    ? mupirocin ointment (BACTROBAN) 2 % Apply 1 application topically 2 (two) times daily. Use as needed for nose sores 22 g 0  ? ?No facility-administered medications prior to visit.  ? ? ?PAST MEDICAL HISTORY: ?Past Medical History:  ?Diagnosis Date  ? Allergy   ? Asthma   ? Cancer [facility])   ? Cataract   ? DDD (degenerative disc disease), lumbar 06/01/2017  ? Depression   ? GERD (gastroesophageal reflux disease)   ? Heart murmur   ? Hyperlipidemia   ? IDA (iron  deficiency anemia) 05/31/2020  ? Peripheral neuropathy 04/30/2017  ? Thyroid disease   ? ? ?PAST SURGICAL HISTORY: ?Past Surgical History:  ?Procedure Laterality Date  ? bladder cancer  2013  ? CARPAL TUNNEL RELEASE  2015  ? CATARACT EXTRACTION Bilateral 2012  ? CHOLECYSTECTOMY    ? COLONOSCOPY WITH ESOPHAGOGASTRODUODENOSCOPY (EGD)  02/22/2018  ? [place] at [facility]  ? SALIVARY GLAND SURGERY Left 2015  ? benign  ? SPINE SURGERY  6/31/21  ? vertaflex  ? TONSILLECTOMY AND ADENOIDECTOMY  1943  ? ? ?FAMILY HISTORY: ?Family History  ?Problem Relation Age of Onset  ? Macular degeneration Mother   ? Hyperlipidemia Father   ? Stroke Father   ?  Cancer Brother   ?     ? stomach  ? Prostate cancer Other   ?     in brothers  ? Colon cancer Neg Hx   ? ? ?SOCIAL HISTORY: ?Social History  ? ?Socioeconomic History  ? Marital status: Significant Other  ?  Spouse name: Not on file  ? Number of children: 0  ? Years of education: 29  ? Highest education level: Not on file  ?Occupational History  ? Occupation: retired  ?Tobacco Use  ? Smoking status: Former  ?  Types: Cigarettes  ?  Quit date: 2015  ?  Years since quitting: 8.3  ? Smokeless tobacco: Never  ?Vaping Use  ? Vaping Use: Never used  ?Substance and Sexual Activity  ? Alcohol use: No  ? Drug use: No  ? Sexual activity: Not on file  ?Other Topics Concern  ? Not on file  ?Social History Narrative  ? Not on file  ? ?Social Determinants of Health  ? ?Financial Resource Strain: Low Risk   ? Difficulty of Paying Living Expenses: Not hard at all  ?Food Insecurity: No Food Insecurity  ? Worried About Charity fundraiser in the Last Year: Never true  ? Ran Out of Food in the Last Year: Never true  ?Transportation Needs: No Transportation Needs  ? Lack of Transportation (Medical): No  ? Lack of Transportation (Non-Medical): No  ?Physical Activity: Insufficiently Active  ? Days of Exercise per Week: 7 days  ? Minutes of Exercise per Session: 20 min  ?Stress: Stress  Concern Present  ? Feeling of Stress : To some extent  ?Social Connections: Moderately Isolated  ? Frequency of Communication with Friends and Family: Once a week  ? Frequency of Social Gatherings with Friends and Family:

## 2022-01-30 DIAGNOSIS — L570 Actinic keratosis: Secondary | ICD-10-CM | POA: Diagnosis not present

## 2022-01-30 DIAGNOSIS — Z85828 Personal history of other malignant neoplasm of skin: Secondary | ICD-10-CM | POA: Diagnosis not present

## 2022-01-30 DIAGNOSIS — D485 Neoplasm of uncertain behavior of skin: Secondary | ICD-10-CM | POA: Diagnosis not present

## 2022-01-30 DIAGNOSIS — L821 Other seborrheic keratosis: Secondary | ICD-10-CM | POA: Diagnosis not present

## 2022-02-03 ENCOUNTER — Encounter: Payer: Self-pay | Admitting: Family Medicine

## 2022-02-04 DIAGNOSIS — M25551 Pain in right hip: Secondary | ICD-10-CM | POA: Diagnosis not present

## 2022-02-04 DIAGNOSIS — M25552 Pain in left hip: Secondary | ICD-10-CM | POA: Diagnosis not present

## 2022-02-12 DIAGNOSIS — M25551 Pain in right hip: Secondary | ICD-10-CM | POA: Diagnosis not present

## 2022-02-12 DIAGNOSIS — M25552 Pain in left hip: Secondary | ICD-10-CM | POA: Diagnosis not present

## 2022-02-13 DIAGNOSIS — Z85828 Personal history of other malignant neoplasm of skin: Secondary | ICD-10-CM | POA: Diagnosis not present

## 2022-02-13 DIAGNOSIS — R21 Rash and other nonspecific skin eruption: Secondary | ICD-10-CM | POA: Diagnosis not present
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
## 2022-02-14 DIAGNOSIS — M48062 Spinal stenosis, lumbar region with neurogenic claudication: Secondary | ICD-10-CM | POA: Diagnosis not present

## 2022-02-14 DIAGNOSIS — G894 Chronic pain syndrome: Secondary | ICD-10-CM | POA: Diagnosis not present

## 2022-02-14 DIAGNOSIS — M5136 Other intervertebral disc degeneration, lumbar region: Secondary | ICD-10-CM | POA: Diagnosis not present
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
## 2022-02-27 ENCOUNTER — Ambulatory Visit (INDEPENDENT_AMBULATORY_CARE_PROVIDER_SITE_OTHER): Payer: Medicare Other | Admitting: Family Medicine

## 2022-02-27 ENCOUNTER — Encounter: Payer: Self-pay | Admitting: Family Medicine

## 2022-02-27 VITALS — BP 144/70 | HR 68 | Temp 98.1°F | Resp 18 | Ht 63.0 in | Wt 164.8 lb

## 2022-02-27 DIAGNOSIS — M48061 Spinal stenosis, lumbar region without neurogenic claudication: Secondary | ICD-10-CM

## 2022-02-27 DIAGNOSIS — R140 Abdominal distension (gaseous): Secondary | ICD-10-CM

## 2022-02-27 DIAGNOSIS — R202 Paresthesia of skin: Secondary | ICD-10-CM | POA: Diagnosis not present

## 2022-02-27 DIAGNOSIS — R194 Change in bowel habit: Secondary | ICD-10-CM

## 2022-02-27 DIAGNOSIS — R5383 Other fatigue: Secondary | ICD-10-CM | POA: Diagnosis not present

## 2022-02-27 LAB — COMPREHENSIVE METABOLIC PANEL WITH GFR
ALT: 10 U/L (ref 0–35)
AST: 15 U/L (ref 0–37)
Albumin: 3.8 g/dL (ref 3.5–5.2)
Alkaline Phosphatase: 71 U/L (ref 39–117)
BUN: 14 mg/dL (ref 6–23)
CO2: 31 meq/L (ref 19–32)
Calcium: 8.6 mg/dL (ref 8.4–10.5)
Chloride: 105 meq/L (ref 96–112)
Creatinine, Ser: 0.85 mg/dL (ref 0.40–1.20)
GFR: 63.34 mL/min
Glucose, Bld: 89 mg/dL (ref 70–99)
Potassium: 4 meq/L (ref 3.5–5.1)
Sodium: 143 meq/L (ref 135–145)
Total Bilirubin: 0.2 mg/dL (ref 0.2–1.2)
Total Protein: 6.6 g/dL (ref 6.0–8.3)

## 2022-02-27 LAB — CBC
HCT: 35.4 % — ABNORMAL LOW (ref 36.0–46.0)
Hemoglobin: 11.7 g/dL — ABNORMAL LOW (ref 12.0–15.0)
MCHC: 33.1 g/dL (ref 30.0–36.0)
MCV: 94.3 fl (ref 78.0–100.0)
Platelets: 201 K/uL (ref 150.0–400.0)
RBC: 3.75 Mil/uL — ABNORMAL LOW (ref 3.87–5.11)
RDW: 14.1 % (ref 11.5–15.5)
WBC: 4.8 K/uL (ref 4.0–10.5)

## 2022-02-27 LAB — VITAMIN B12: Vitamin B-12: 474 pg/mL (ref 211–911)

## 2022-02-27 LAB — TSH: TSH: 2.42 u[IU]/mL (ref 0.35–5.50)

## 2022-02-27 NOTE — Patient Instructions (Addendum)
Good to see you today We will check a Ca-125 to screen for ovarian cancer- I will let you know what this shows Please follow-up with Dr Bryan Lemma regarding your bowel habit change I think the itching with your back is something called notalgia paraesthetica  Please let me know if you would like me to send you to a different PT

## 2022-02-28 ENCOUNTER — Encounter: Payer: Self-pay | Admitting: Family Medicine

## 2022-02-28 LAB — CA 125: CA 125: 11 U/mL (ref <35)

## 2022-03-04 ENCOUNTER — Encounter: Payer: Medicare Other | Attending: Psychology | Admitting: Psychology

## 2022-03-04 DIAGNOSIS — G8929 Other chronic pain: Secondary | ICD-10-CM | POA: Diagnosis not present

## 2022-03-04 DIAGNOSIS — G4733 Obstructive sleep apnea (adult) (pediatric): Secondary | ICD-10-CM | POA: Diagnosis not present

## 2022-03-04 DIAGNOSIS — F411 Generalized anxiety disorder: Secondary | ICD-10-CM | POA: Insufficient documentation

## 2022-03-04 DIAGNOSIS — M79604 Pain in right leg: Secondary | ICD-10-CM | POA: Diagnosis not present

## 2022-03-04 DIAGNOSIS — M79605 Pain in left leg: Secondary | ICD-10-CM | POA: Insufficient documentation

## 2022-03-06 ENCOUNTER — Ambulatory Visit: Payer: Medicare Other | Admitting: Physical Medicine and Rehabilitation

## 2022-03-06 ENCOUNTER — Encounter: Payer: Medicare Other | Admitting: Physical Medicine and Rehabilitation

## 2022-03-10 DIAGNOSIS — L400 Psoriasis vulgaris: Secondary | ICD-10-CM | POA: Diagnosis not present

## 2022-03-10 DIAGNOSIS — L309 Dermatitis, unspecified: Secondary | ICD-10-CM | POA: Diagnosis not present

## 2022-03-10 DIAGNOSIS — Z85828 Personal history of other malignant neoplasm of skin: Secondary | ICD-10-CM | POA: Diagnosis not present

## 2022-03-13 ENCOUNTER — Encounter: Payer: Self-pay | Admitting: Psychology
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
## 2022-03-17 ENCOUNTER — Encounter: Payer: Medicare Other | Attending: Psychology | Admitting: Psychology

## 2022-03-17 ENCOUNTER — Encounter: Payer: Self-pay | Admitting: Psychology

## 2022-03-17 DIAGNOSIS — F341 Dysthymic disorder: Secondary | ICD-10-CM | POA: Diagnosis not present

## 2022-03-17 DIAGNOSIS — M79604 Pain in right leg: Secondary | ICD-10-CM | POA: Diagnosis not present

## 2022-03-17 DIAGNOSIS — G4733 Obstructive sleep apnea (adult) (pediatric): Secondary | ICD-10-CM | POA: Diagnosis not present

## 2022-03-17 DIAGNOSIS — M79605 Pain in left leg: Secondary | ICD-10-CM | POA: Diagnosis not present

## 2022-03-17 DIAGNOSIS — G8929 Other chronic pain: Secondary | ICD-10-CM | POA: Insufficient documentation
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
## 2022-03-26 ENCOUNTER — Encounter: Payer: Self-pay | Admitting: Family Medicine

## 2022-03-26 DIAGNOSIS — R2689 Other abnormalities of gait and mobility: Secondary | ICD-10-CM

## 2022-03-26 DIAGNOSIS — M48061 Spinal stenosis, lumbar region without neurogenic claudication: Secondary | ICD-10-CM

## 2022-03-27 ENCOUNTER — Ambulatory Visit: Payer: Medicare Other | Admitting: Family Medicine

## 2022-03-31 ENCOUNTER — Ambulatory Visit: Payer: Medicare Other | Admitting: Physical Medicine and Rehabilitation

## 2022-04-02 DIAGNOSIS — R21 Rash and other nonspecific skin eruption: Secondary | ICD-10-CM | POA: Diagnosis not present

## 2022-04-02 DIAGNOSIS — L400 Psoriasis vulgaris: Secondary | ICD-10-CM | POA: Diagnosis not present

## 2022-04-02 DIAGNOSIS — Z85828 Personal history of other malignant neoplasm of skin: Secondary | ICD-10-CM | POA: Diagnosis not present

## 2022-04-07 ENCOUNTER — Ambulatory Visit: Payer: Medicare Other | Admitting: Psychology

## 2022-04-07 DIAGNOSIS — M5416 Radiculopathy, lumbar region: Secondary | ICD-10-CM | POA: Diagnosis not present

## 2022-04-07 DIAGNOSIS — M47816 Spondylosis without myelopathy or radiculopathy, lumbar region: Secondary | ICD-10-CM | POA: Diagnosis not present

## 2022-04-07 DIAGNOSIS — M25561 Pain in right knee: Secondary | ICD-10-CM | POA: Diagnosis not present

## 2022-04-07 DIAGNOSIS — M25562 Pain in left knee: Secondary | ICD-10-CM | POA: Diagnosis not present

## 2022-04-07 DIAGNOSIS — G8929 Other chronic pain: Secondary | ICD-10-CM | POA: Diagnosis not present

## 2022-04-08 NOTE — Progress Notes (Unsigned)
[facility] [place], [place], [place] [address] [phone]  Date:  04/09/2022   Name:  Mary Buck   DOB:  July 16, 1936   MRN:  [id number]  PCP:  Darreld Mclean, MD    Chief Complaint: Rash (X weeks that is worsening has tried OTC therapy. )   History of Present Illness:  Mary Buck is a 86 y.o. very pleasant female patient who presents with the following:  Patient seen today with concern of a rash Most recent visit with myself about 1 month ago history of significant skin cancer problems, bladder cancer, spinal stenosis, hypothyroidism, hyperlipidemia, peripheral neuropathy, neurogenic claudication, squamous cell cancer to the left leg status post or with radiation  Unfortunately she has persistent pain from her spinal stenosis and neurogenic claudication.  She does see pain management, had a percutaneous decompression this past January  Today she notes a rash on her trunk for 3-4 weeks, maybe longer It stings when she washes it Her derm looked at it and they recommended trying a "medicated" OTC skin powder She also tried a trimacinolone but this did not work either   They got a Risk manager!  They thought it was a grown cat but the vet said he is 6 months old-nevertheless, they plan to keep him and are really enjoying their new pet Patient Active Problem List   Diagnosis Date Noted   Thyroid disease    Heart murmur    GERD (gastroesophageal reflux disease)    Depression    Cataract    Cancer ([place])    Asthma    Allergy    IDA (iron deficiency anemia) 05/31/2020   Osteopenia 05/30/2019   Pedal edema 01/31/2018   Squamous cell carcinoma of skin of left lower limb, including hip 12/16/2017   Plantar fasciitis 07/07/2017   DDD (degenerative disc disease), lumbar 06/01/2017   Fibromyalgia 06/01/2017   Spinal stenosis at L4-L5 level 04/30/2017   Peripheral neuropathy 04/30/2017   Encounter for  therapeutic drug monitoring 03/05/2017   Bladder cancer ([place]) 08/20/2015   Arthropathy of lumbar facet joint 07/27/2015   Spinal stenosis of cervical region 07/27/2015   Spondylolisthesis 07/27/2015   Hypothyroid 07/05/2015   Abdominal pain, acute, left upper quadrant 06/28/2015   Basal cell carcinoma, face 06/28/2015   Bilateral groin pain 06/28/2015   Chronic pain 06/28/2015   Fatigue 06/28/2015   Hyperlipidemia 06/28/2015   Bilateral hip pain 06/28/2015   Paresthesia of lower lip 06/28/2015   Pulmonary nodule 06/28/2015   Pustulosis palmaris et plantaris 06/28/2015   Ulcer of nose 06/28/2015   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   Mass of parotid gland 03/24/2014    Past Medical History:  Diagnosis Date   Allergy    Asthma    Cancer ([place])    Cataract    DDD (degenerative disc disease), lumbar 06/01/2017   Depression    GERD (gastroesophageal reflux disease)    Heart murmur    Hyperlipidemia    IDA (iron deficiency anemia) 05/31/2020   Peripheral neuropathy 04/30/2017   Thyroid disease     Past Surgical History:  Procedure Laterality Date   bladder cancer  2013   CARPAL TUNNEL RELEASE  2015   CATARACT EXTRACTION Bilateral 2012   CHOLECYSTECTOMY     COLONOSCOPY WITH ESOPHAGOGASTRODUODENOSCOPY (EGD)  02/22/2018   [facility] Left 2015   benign   SPINE SURGERY  6/31/21   vertaflex   TONSILLECTOMY AND ADENOIDECTOMY  1943    Social History   Tobacco Use   Smoking status: Former    Types: Cigarettes    Quit date: 2015    Years since quitting: 8.5   Smokeless tobacco: Never  Vaping Use   Vaping Use: Never used  Substance Use Topics   Alcohol use: No   Drug use: No    Family History  Problem Relation Age of Onset   Macular degeneration Mother    Hyperlipidemia Father    Stroke Father    Cancer Brother        ? stomach   Prostate cancer Other        in brothers   Colon cancer Neg Hx     Allergies  Allergen  Reactions   Contrast Media [Iodinated Contrast Media] Anaphylaxis   Oxycodone Anaphylaxis    "my throat swells with any opioid"   Latex Other (See Comments)    Other reaction(s): blistering, redness    Mirtazapine Nausea Only   Acitretin Rash   Cephalexin Other (See Comments)    Unknown   Elemental Sulfur Other (See Comments)    unknown   Penicillins Other (See Comments)    Unknown   Sulfa Antibiotics Other (See Comments)    Unknown    Medication list has been reviewed and updated.  Current Outpatient Medications on File Prior to Visit  Medication Sig Dispense Refill   Cholecalciferol (VITAMIN D-3 PO) Take 1,000 Units by mouth daily.     famotidine (PEPCID) 20 MG tablet Take 1 tablet (20 mg total) by mouth at bedtime. 90 tablet 3   gabapentin (NEURONTIN) 300 MG capsule Take 1 capsule (300 mg total) by mouth 3 (three) times daily. 90 capsule 4   lansoprazole (PREVACID) 30 MG capsule TAKE 1 CAPSULE(30 MG) BY MOUTH TWICE DAILY BEFORE A MEAL 180 capsule 1   levothyroxine (SYNTHROID) 88 MCG tablet TAKE 1 TABLET(88 MCG) BY MOUTH DAILY BEFORE AND BREAKFAST 90 tablet 1   misoprostol (CYTOTEC) 100 MCG tablet TAKE 2 TABLETS BY MOUTH AFTER BREAKFAST AND AFTER DINNER AS DIRECTED 120 tablet 3   mupirocin ointment (BACTROBAN) 2 % Apply 1 application. topically 2 (two) times daily. 22 g 0   simvastatin (ZOCOR) 20 MG tablet TAKE 1 TABLET(20 MG) BY MOUTH DAILY 90 tablet 3   traMADol (ULTRAM) 50 MG tablet Take 50 mg by mouth 3 (three) times daily.     No current facility-administered medications on file prior to visit.    Review of Systems:  As per HPI- otherwise negative.   Physical Examination: Vitals:   04/09/22 1031  BP: 128/80  Pulse: 63  Resp: 18  Temp: 97.9 F (36.6 C)  SpO2: 97%   Vitals:   04/09/22 1031  Weight: 163 lb (73.9 kg)  Height: '5\' 3"'$  (1.6 m)   Body mass index is 28.87 kg/m. Ideal Body Weight: Weight in (lb) to have BMI = 25: 140.8 GEN: No acute distress;  alert,appropriate. PULM: Breathing comfortably in no respiratory distress PSYCH: Normally interactive.  Patient has rash consistent with Candida intertrigo under both breasts  Assessment and Plan: Candidal intertrigo - Plan: nystatin (MYCOSTATIN/NYSTOP) powder  Seen today with Candida intertrigo.  Discussed strategies for keeping the skin dry, prescribed nystatin powder.  I have asked her to let me know if not improving within a week or so  Signed Lamar Blinks, MD

## 2022-04-09 ENCOUNTER — Ambulatory Visit (INDEPENDENT_AMBULATORY_CARE_PROVIDER_SITE_OTHER): Payer: Medicare Other | Admitting: Family Medicine

## 2022-04-09 VITALS — BP 128/80 | HR 63 | Temp 97.9°F | Resp 18 | Ht 63.0 in | Wt 163.0 lb

## 2022-04-09 DIAGNOSIS — B372 Candidiasis of skin and nail: Secondary | ICD-10-CM | POA: Diagnosis not present

## 2022-04-09 MED ORDER — NYSTATIN 100000 UNIT/GM EX POWD
1.0000 | Freq: Three times a day (TID) | CUTANEOUS | 1 refills | Status: DC
Start: 2022-04-09 — End: 2023-09-04

## 2022-04-09 NOTE — Patient Instructions (Addendum)
It was good to see you today  I think you have a yeast infection in the skin folds under your breasts- lets have you try the nystatin/ antifungal powder 2-3x a day as needed Try to keep the area really dry- perhaps use a hair dryer on cool setting after bathing or when sweaty!    Let me know if not improving soon

## 2022-04-14 DIAGNOSIS — M48062 Spinal stenosis, lumbar region with neurogenic claudication: Secondary | ICD-10-CM | POA: Diagnosis not present

## 2022-04-14 DIAGNOSIS — M5136 Other intervertebral disc degeneration, lumbar region: Secondary | ICD-10-CM | POA: Diagnosis not present

## 2022-04-14 DIAGNOSIS — G894 Chronic pain syndrome: Secondary | ICD-10-CM | POA: Diagnosis not present

## 2022-04-14 DIAGNOSIS — M47816 Spondylosis without myelopathy or radiculopathy, lumbar region: Secondary | ICD-10-CM | POA: Diagnosis not present

## 2022-04-16 ENCOUNTER — Other Ambulatory Visit: Payer: Self-pay | Admitting: Family Medicine

## 2022-04-16 DIAGNOSIS — K219 Gastro-esophageal reflux disease without esophagitis: Secondary | ICD-10-CM

## 2022-04-17 ENCOUNTER — Other Ambulatory Visit: Payer: Self-pay | Admitting: Gastroenterology

## 2022-04-24 DIAGNOSIS — R2689 Other abnormalities of gait and mobility: Secondary | ICD-10-CM | POA: Diagnosis not present

## 2022-04-24 DIAGNOSIS — M549 Dorsalgia, unspecified: Secondary | ICD-10-CM | POA: Diagnosis not present

## 2022-04-28 ENCOUNTER — Encounter: Payer: Medicare Other | Attending: Psychology | Admitting: Psychology

## 2022-04-28 DIAGNOSIS — G479 Sleep disorder, unspecified: Secondary | ICD-10-CM | POA: Insufficient documentation

## 2022-04-28 DIAGNOSIS — F411 Generalized anxiety disorder: Secondary | ICD-10-CM | POA: Diagnosis not present

## 2022-04-28 DIAGNOSIS — G4733 Obstructive sleep apnea (adult) (pediatric): Secondary | ICD-10-CM | POA: Insufficient documentation

## 2022-04-28 DIAGNOSIS — G894 Chronic pain syndrome: Secondary | ICD-10-CM | POA: Insufficient documentation

## 2022-04-28 DIAGNOSIS — M79605 Pain in left leg: Secondary | ICD-10-CM | POA: Diagnosis not present

## 2022-04-28 DIAGNOSIS — M79604 Pain in right leg: Secondary | ICD-10-CM | POA: Insufficient documentation

## 2022-04-29 ENCOUNTER — Encounter: Payer: Self-pay | Admitting: Psychology

## 2022-04-29 NOTE — Progress Notes (Signed)
Neuropsychology Visit  Patient:  Mary Buck   DOB: 03/02/1936  MR Number: [id number]  Location: [facility] [place], [place] [id number] [place] [address] Dept: [phone]  Date of Service: 04/29/2022  Start: 1 PM End: 2 PM  Today's visit was an in person visit was conducted in my outpatient clinic office.  The patient myself were present.  Duration of Service: 1 Hour  Provider/Observer:     Edgardo Roys PsyD  Chief Complaint:      Chief Complaint  Patient presents with   Pain   Anxiety   Depression   Stress    Reason For Service:     Mary Buck is an 86 year old female who has a history of anxiety and depressive symptomatology going back to adolescence.  Patient has had issues with insomnia since age 83 that were started around the time when she had an encounter with a snake and stayed up all night worrying that she had been bitten with a venomous bite despite absence of physical signs or symptoms.  OCD and anxiety have persisted throughout life.  Patient had a recent neuropsychological evaluation due to concerns of cognitive changes but did quite well on almost all neuropsychological measures and memory issues are likely related to anxiety and normal age-related changes.  The patient has been followed by Dr. Darol Destine for psychotherapeutic interventions and after he left the practice she is following up with myself.  Treatment Interventions:  Cognitive/behavioral psychotherapeutic interventions or any issues related to generalized anxiety and coping and stress.  Participation Level:   Active  Participation Quality:  Appropriate      Behavioral Observation:  Well Groomed, Alert, and Appropriate.   Current Psychosocial Factors: The patient reports that she and her partner have decided on a That they both are very happy with and is adjusting well.   This has been some degree of a stressor with her as they work through trying to figure out how to replace and choose the right pet for both of them.  The patient reports that she has been having more pain recently and this is complicated her ability to be active outside of her home.  Content of Session:   Reviewed current symptoms and worked on therapeutic interventions around issues of anxiety and depressive symptomatology and coping with various stressors in her life.  Effectiveness of Interventions: Report has begun to be established and the patient has been open and engaging throughout the therapeutic efforts and we are working on transitioning from previous psychologist.  Target Goals:   We continue to work issues related to coping skills around her anxiety and adjustment to significant changes in physical functioning.  Patient continues with significant back pain and has an appointment coming up with her orthopedist.  Patient has been diagnosed with obstructive sleep apnea but has not been able to tolerate a CPAP machine when she was initially in the fitting.  And will also be important to try to get the patient to give the CPAP device another effort as it may have a beneficial effect for her anxiety, memory and depressive symptomatology.  Goals Last Reviewed:   04/29/2022  Goals Addressed Today:    Today we continue to work on coping skills and strategies around anxiety and depressive types of symptoms and dealing with significant chronic pain issues and coping with aging.  Impression/Diagnosis:   Mary Buck is an 86 year old female  who has a history of anxiety and depressive symptomatology going back to adolescence.  Patient has had issues with insomnia since age 77 that were started around the time when she had an encounter with a snake and stayed up all night worrying that she had been bitten with a venomous bite despite absence of physical signs or symptoms.  OCD and anxiety have  persisted throughout life.  Patient had a recent neuropsychological evaluation due to concerns of cognitive changes but did quite well on almost all neuropsychological measures and memory issues are likely related to anxiety and normal age-related changes.  The patient has been followed by Dr. Darol Destine for psychotherapeutic interventions and after he left the practice she is following up with myself.  The patient has an upcoming appointment with Dr. Posey Pronto who is an anesthesiologist about interventions that may be possible due to her chronic radicular lumbar pain symptoms.  Diagnosis:   Chronic pain syndrome  Pain in both lower extremities  OSA (obstructive sleep apnea)  Generalized anxiety disorder  Sleep disturbance    [name], Psy.D. Clinical Psychologist Neuropsychologist

## 2022-05-06 ENCOUNTER — Other Ambulatory Visit: Payer: Self-pay | Admitting: Gastroenterology

## 2022-05-06 DIAGNOSIS — M48061 Spinal stenosis, lumbar region without neurogenic claudication: Secondary | ICD-10-CM | POA: Diagnosis not present

## 2022-05-06 DIAGNOSIS — M48062 Spinal stenosis, lumbar region with neurogenic claudication: Secondary | ICD-10-CM | POA: Diagnosis not present

## 2022-05-06 DIAGNOSIS — M5416 Radiculopathy, lumbar region: Secondary | ICD-10-CM | POA: Diagnosis not present

## 2022-05-14 DIAGNOSIS — R2689 Other abnormalities of gait and mobility: Secondary | ICD-10-CM | POA: Diagnosis not present

## 2022-05-14 DIAGNOSIS — M549 Dorsalgia, unspecified: Secondary | ICD-10-CM | POA: Diagnosis not present

## 2022-05-19 NOTE — Progress Notes (Signed)
[facility] at [facility] [address] [place], [place] [address] [phone]  Date:  05/22/2022   Name:  Mary Buck   DOB:  [birth year]   MRN:  [id number]  PCP:  Darreld Mclean, MD    Chief Complaint: 4 month follow up (Concerns/ questions: 1.pt asks about wearing a mask. 2. new covid booster. 3.when to get her flu shot? 4. Is it safe to use the powder on open wounds? /)   History of Present Illness:  Mary Buck is a 86 y.o. very pleasant female patient who presents with the following:  Patient seen today for periodic follow-up-visit with myself in July history of significant skin cancer problems, bladder cancer, spinal stenosis, hypothyroidism, hyperlipidemia, peripheral neuropathy, neurogenic claudication, squamous cell cancer to the left leg status post or with radiation   Unfortunately she has persistent pain from her spinal stenosis and neurogenic claudication.  She does see pain management, had a percutaneous decompression this past January  She is doing physical therapy to work on her balance, was seen on August 22 to follow-up on her spinal stenosis operation-Dr. Posey Pronto with [facility] did an epidural steroid injection for her at that time, L2/L3 She is not sure if this helped her at all The pain in her legs is getting overall worse If she presses on her anterior left hip it helps with he pain   She noted more pain in her anterior left shin today- this is the site of an old radiation burn from her skin cancer She also notes some cracking of the skin under her breasts from Candida intertrigo, wonders if she is okay to use nystatin on this area  Flu shot, COVID booster needed this fall-we discussed, she prefers to do her flu shot a little bit later in the season Labs done in June Lab Results  Component Value Date   TSH 2.42 02/27/2022    Patient Active Problem List   Diagnosis Date Noted   Thyroid  disease    Heart murmur    GERD (gastroesophageal reflux disease)    Depression    Cataract    Cancer ([place])    Asthma    Allergy    IDA (iron deficiency anemia) 05/31/2020   Osteopenia 05/30/2019   Pedal edema 01/31/2018   Squamous cell carcinoma of skin of left lower limb, including hip 12/16/2017   Plantar fasciitis 07/07/2017   DDD (degenerative disc disease), lumbar 06/01/2017   Fibromyalgia 06/01/2017   Spinal stenosis at L4-L5 level 04/30/2017   Peripheral neuropathy 04/30/2017   Encounter for therapeutic drug monitoring 03/05/2017   Bladder cancer ([place]) 08/20/2015   Arthropathy of lumbar facet joint 07/27/2015   Spinal stenosis of cervical region 07/27/2015   Spondylolisthesis 07/27/2015   Hypothyroid 07/05/2015   Abdominal pain, acute, left upper quadrant 06/28/2015   Basal cell carcinoma, face 06/28/2015   Bilateral groin pain 06/28/2015   Chronic pain 06/28/2015   Fatigue 06/28/2015   Hyperlipidemia 06/28/2015   Bilateral hip pain 06/28/2015   Paresthesia of lower lip 06/28/2015   Pulmonary nodule 06/28/2015   Pustulosis palmaris et plantaris 06/28/2015   Ulcer of nose 06/28/2015   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   Mass of parotid gland 03/24/2014    Past Medical History:  Diagnosis Date   Allergy    Asthma    Cancer ([place])    Cataract    DDD (degenerative disc disease), lumbar 06/01/2017  Depression    GERD (gastroesophageal reflux disease)    Heart murmur    Hyperlipidemia    IDA (iron deficiency anemia) 05/31/2020   Peripheral neuropathy 04/30/2017   Thyroid disease     Past Surgical History:  Procedure Laterality Date   bladder cancer  2013   CARPAL TUNNEL RELEASE  2015   CATARACT EXTRACTION Bilateral 2012   CHOLECYSTECTOMY     COLONOSCOPY WITH ESOPHAGOGASTRODUODENOSCOPY (EGD)  02/22/2018   [facility] Left 2015   benign   SPINE SURGERY  6/31/21   vertaflex   TONSILLECTOMY AND ADENOIDECTOMY   1943    Social History   Tobacco Use   Smoking status: Former    Types: Cigarettes    Quit date: 2015    Years since quitting: 8.6   Smokeless tobacco: Never  Vaping Use   Vaping Use: Never used  Substance Use Topics   Alcohol use: No   Drug use: No    Family History  Problem Relation Age of Onset   Macular degeneration Mother    Hyperlipidemia Father    Stroke Father    Cancer Brother        ? stomach   Prostate cancer Other        in brothers   Colon cancer Neg Hx     Allergies  Allergen Reactions   Contrast Media [Iodinated Contrast Media] Anaphylaxis   Oxycodone Anaphylaxis    "my throat swells with any opioid"   Latex Other (See Comments)    Other reaction(s): blistering, redness    Mirtazapine Nausea Only   Acitretin Rash   Cephalexin Other (See Comments)    Unknown   Elemental Sulfur Other (See Comments)    unknown   Penicillins Other (See Comments)    Unknown   Sulfa Antibiotics Other (See Comments)    Unknown    Medication list has been reviewed and updated.  Current Outpatient Medications on File Prior to Visit  Medication Sig Dispense Refill   Cholecalciferol (VITAMIN D-3 PO) Take 1,000 Units by mouth daily.     famotidine (PEPCID) 20 MG tablet TAKE 1 TABLET(20 MG) BY MOUTH AT BEDTIME 90 tablet 3   gabapentin (NEURONTIN) 300 MG capsule Take 1 capsule (300 mg total) by mouth 3 (three) times daily. 90 capsule 4   lansoprazole (PREVACID) 30 MG capsule TAKE 1 CAPSULE(30 MG) BY MOUTH TWICE DAILY BEFORE A MEAL 180 capsule 1   levothyroxine (SYNTHROID) 88 MCG tablet TAKE 1 TABLET(88 MCG) BY MOUTH DAILY BEFORE AND BREAKFAST 90 tablet 1   misoprostol (CYTOTEC) 100 MCG tablet TAKE 2 TABLETS BY MOUTH AFTER BREAKFAST AND AFTER DINNER AS DIRECTED 120 tablet 5   nystatin (MYCOSTATIN/NYSTOP) powder Apply 1 Application topically 3 (three) times daily. 30 g 1   simvastatin (ZOCOR) 20 MG tablet TAKE 1 TABLET(20 MG) BY MOUTH DAILY 90 tablet 3   traMADol (ULTRAM)  50 MG tablet Take 50 mg by mouth 3 (three) times daily.     No current facility-administered medications on file prior to visit.    Review of Systems:  As per HPI- otherwise negative.   Physical Examination: Vitals:   05/22/22 1127  BP: 122/72  Pulse: 65  Resp: 18  Temp: 97.6 F (36.4 C)  SpO2: 97%   Vitals:   05/22/22 1127  Height: '5\' 3"'$  (1.6 m)   Body mass index is 28.87 kg/m. Ideal Body Weight: Weight in (lb) to have BMI = 25:  140.8  GEN: no acute distress.  Looks well  HEENT: Atraumatic, Normocephalic.  Ears and Nose: No external deformity. CV: RRR, No M/G/R. No JVD. No thrill. No extra heart sounds. PULM: CTA B, no wheezes, crackles, rhonchi. No retractions. No resp. distress. No accessory muscle use. ABD: S, NT, ND, +BS. No rebound. No HSM. EXTR: No c/c/e PSYCH: Normally interactive. Conversant.  Post radiation changes to her left shin from skin cancer-does not appear particularly worse than usual, no sign of infection She has improved Candida intertrigo under both breasts, she is treating with nystatin powder  Assessment and Plan: Candidal intertrigo  Balance disorder  Radiation dermatitis  Following up today with a couple of concerns. Inez Catalina is overall doing well, we discussed timing of her flu shot and COVID booster this fall I recommend that she try a barrier such as Vaseline over her irradiated skin as needed Candida intertrigo actually looks improved She continues to attend physical therapy to work on her balance and strength Plan for follow-up in about 4 months  Signed Lamar Blinks, MD

## 2022-05-20 ENCOUNTER — Other Ambulatory Visit: Payer: Self-pay | Admitting: Gastroenterology

## 2022-05-22 ENCOUNTER — Ambulatory Visit (INDEPENDENT_AMBULATORY_CARE_PROVIDER_SITE_OTHER): Payer: Medicare Other | Admitting: Family Medicine

## 2022-05-22 VITALS — BP 122/72 | HR 65 | Temp 97.6°F | Resp 18 | Ht 63.0 in

## 2022-05-22 DIAGNOSIS — B372 Candidiasis of skin and nail: Secondary | ICD-10-CM

## 2022-05-22 DIAGNOSIS — L589 Radiodermatitis, unspecified: Secondary | ICD-10-CM | POA: Diagnosis not present

## 2022-05-22 DIAGNOSIS — R2689 Other abnormalities of gait and mobility: Secondary | ICD-10-CM | POA: Diagnosis not present

## 2022-05-22 NOTE — Patient Instructions (Signed)
Good to see you today- please keep an eye on your leg and let me know if not doing ok Avoid sock pressure and perhaps try some vaseline  I would recommend getting your flu shot and a covid booster this fall Assuming all is well please see me in about 4 months

## 2022-05-26 ENCOUNTER — Encounter: Payer: Medicare Other | Attending: Psychology | Admitting: Psychology

## 2022-05-26 DIAGNOSIS — G4733 Obstructive sleep apnea (adult) (pediatric): Secondary | ICD-10-CM | POA: Diagnosis not present

## 2022-05-26 DIAGNOSIS — G894 Chronic pain syndrome: Secondary | ICD-10-CM | POA: Insufficient documentation

## 2022-05-26 DIAGNOSIS — M79605 Pain in left leg: Secondary | ICD-10-CM | POA: Insufficient documentation

## 2022-05-26 DIAGNOSIS — G479 Sleep disorder, unspecified: Secondary | ICD-10-CM | POA: Diagnosis not present

## 2022-05-26 DIAGNOSIS — M79604 Pain in right leg: Secondary | ICD-10-CM | POA: Diagnosis not present

## 2022-05-26 DIAGNOSIS — F411 Generalized anxiety disorder: Secondary | ICD-10-CM | POA: Diagnosis not present

## 2022-05-27 ENCOUNTER — Encounter: Payer: Self-pay | Admitting: Psychology

## 2022-05-27 DIAGNOSIS — M48062 Spinal stenosis, lumbar region with neurogenic claudication: Secondary | ICD-10-CM | POA: Diagnosis not present

## 2022-05-27 DIAGNOSIS — M25561 Pain in right knee: Secondary | ICD-10-CM | POA: Diagnosis not present

## 2022-05-27 DIAGNOSIS — G894 Chronic pain syndrome: Secondary | ICD-10-CM | POA: Diagnosis not present

## 2022-05-27 DIAGNOSIS — M5136 Other intervertebral disc degeneration, lumbar region: Secondary | ICD-10-CM | POA: Diagnosis not present

## 2022-05-27 DIAGNOSIS — M1712 Unilateral primary osteoarthritis, left knee: Secondary | ICD-10-CM | POA: Diagnosis not present

## 2022-05-27 DIAGNOSIS — Z79899 Other long term (current) drug therapy: Secondary | ICD-10-CM | POA: Diagnosis not present

## 2022-05-27 DIAGNOSIS — M25562 Pain in left knee: Secondary | ICD-10-CM | POA: Diagnosis not present

## 2022-05-27 DIAGNOSIS — G8929 Other chronic pain: Secondary | ICD-10-CM | POA: Diagnosis not present

## 2022-05-27 NOTE — Progress Notes (Signed)
Neuropsychology Visit  Patient:  Mary Buck   DOB: [birth year]  MR Number: [id number]  Location: [facility] [place], [place] [id number] [place] [address] Dept: [phone]  Date of Service: 05/26/2022  Start: 1 PM End: 2 PM  Today's visit was an in person visit was conducted in my outpatient clinic office.  The patient myself were present.  Duration of Service: 1 Hour  Provider/Observer:     Edgardo Roys PsyD  Chief Complaint:      Chief Complaint  Patient presents with   Pain   Anxiety   Depression   Stress    Reason For Service:     [name] is an 86 year old female who has a history of anxiety and depressive symptomatology going back to adolescence.  Patient has had issues with insomnia since age 63 that were started around the time when she had an encounter with a snake and stayed up all night worrying that she had been bitten with a venomous bite despite absence of physical signs or symptoms.  OCD and anxiety have persisted throughout life.  Patient had a recent neuropsychological evaluation due to concerns of cognitive changes but did quite well on almost all neuropsychological measures and memory issues are likely related to anxiety and normal age-related changes.  The patient has been followed by Dr. Darol Destine for psychotherapeutic interventions and after he left the practice she is following up with myself.  Treatment Interventions:  Cognitive/behavioral psychotherapeutic interventions or any issues related to generalized anxiety and coping and stress.  Participation Level:   Active  Participation Quality:  Appropriate      Behavioral Observation:  Well Groomed, Alert, and Appropriate.   Current Psychosocial Factors: The patient reports that she has struggled with the concepts that she is going to be unable to effectively reduce her pain  and increase her physical strength.  The patient reports that she has seen a couple of doctors since our last visit and they essentially told her that she is getting older and will have to learn to live with her issues particularly those created by her back.  This is very difficult for her to manage and feels frustrated that her partner has to help out more physically and do more chores around the house.  Patient reports that she is having some memory retrieval issues but it does not appear to be related to encoding, storage organization of new information.  Content of Session:   Reviewed current symptoms and worked on therapeutic interventions around issues of anxiety and depressive symptomatology and coping with various stressors in her life.  Effectiveness of Interventions: Report has begun to be established and the patient has been open and engaging throughout the therapeutic efforts and we are working on transitioning from previous psychologist.  Target Goals:   We continue to work issues related to coping skills around her anxiety and adjustment to significant changes in physical functioning.  Patient continues with significant back pain and has an appointment coming up with her orthopedist.  Patient has been diagnosed with obstructive sleep apnea but has not been able to tolerate a CPAP machine when she was initially in the fitting.  And will also be important to try to get the patient to give the CPAP device another effort as it may have a beneficial effect for her anxiety, memory and depressive symptomatology.  Goals Last Reviewed:   05/26/2022  Goals  Addressed Today:    Today we continue to work on coping skills and strategies around anxiety and depressive types of symptoms and dealing with significant chronic pain issues and coping with aging.  Impression/Diagnosis:   Mary Buck. Barbe is an 86 year old female who has a history of anxiety and depressive symptomatology going back to adolescence.   Patient has had issues with insomnia since age 83 that were started around the time when she had an encounter with a snake and stayed up all night worrying that she had been bitten with a venomous bite despite absence of physical signs or symptoms.  OCD and anxiety have persisted throughout life.  Patient had a recent neuropsychological evaluation due to concerns of cognitive changes but did quite well on almost all neuropsychological measures and memory issues are likely related to anxiety and normal age-related changes.  The patient has been followed by Dr. Darol Destine for psychotherapeutic interventions and after he left the practice she is following up with myself.  The patient has an upcoming appointment with Dr. Posey Pronto who is an anesthesiologist about interventions that may be possible due to her chronic radicular lumbar pain symptoms.  Diagnosis:   Chronic pain syndrome  Pain in both lower extremities  OSA (obstructive sleep apnea)  Generalized anxiety disorder  Sleep disturbance    [name], Psy.D. Clinical Psychologist Neuropsychologist

## 2022-05-28 DIAGNOSIS — R2689 Other abnormalities of gait and mobility: Secondary | ICD-10-CM | POA: Diagnosis not present

## 2022-06-03 DIAGNOSIS — R2689 Other abnormalities of gait and mobility: Secondary | ICD-10-CM | POA: Diagnosis not present

## 2022-06-10 DIAGNOSIS — M1711 Unilateral primary osteoarthritis, right knee: Secondary | ICD-10-CM | POA: Diagnosis not present

## 2022-06-10 DIAGNOSIS — M25561 Pain in right knee: Secondary | ICD-10-CM | POA: Diagnosis not present

## 2022-06-11 DIAGNOSIS — R2689 Other abnormalities of gait and mobility: Secondary | ICD-10-CM | POA: Diagnosis not present

## 2022-06-12 DIAGNOSIS — L400 Psoriasis vulgaris: Secondary | ICD-10-CM | POA: Diagnosis not present

## 2022-06-12 DIAGNOSIS — L720 Epidermal cyst: Secondary | ICD-10-CM | POA: Diagnosis not present

## 2022-06-12 DIAGNOSIS — D485 Neoplasm of uncertain behavior of skin: Secondary | ICD-10-CM | POA: Diagnosis not present

## 2022-06-12 DIAGNOSIS — Z85828 Personal history of other malignant neoplasm of skin: Secondary | ICD-10-CM | POA: Diagnosis not present

## 2022-06-16 ENCOUNTER — Other Ambulatory Visit (HOSPITAL_BASED_OUTPATIENT_CLINIC_OR_DEPARTMENT_OTHER): Payer: Self-pay | Admitting: Family Medicine

## 2022-06-16 DIAGNOSIS — Z1231 Encounter for screening mammogram for malignant neoplasm of breast: Secondary | ICD-10-CM

## 2022-06-18 DIAGNOSIS — Z23 Encounter for immunization: Secondary | ICD-10-CM | POA: Diagnosis not present

## 2022-06-18 DIAGNOSIS — R2689 Other abnormalities of gait and mobility: Secondary | ICD-10-CM | POA: Diagnosis not present

## 2022-06-26 ENCOUNTER — Encounter: Payer: Medicare Other | Attending: Psychology | Admitting: Psychology

## 2022-06-26 DIAGNOSIS — G894 Chronic pain syndrome: Secondary | ICD-10-CM | POA: Diagnosis not present

## 2022-06-26 DIAGNOSIS — G4733 Obstructive sleep apnea (adult) (pediatric): Secondary | ICD-10-CM | POA: Insufficient documentation

## 2022-06-26 DIAGNOSIS — M79605 Pain in left leg: Secondary | ICD-10-CM | POA: Insufficient documentation

## 2022-06-26 DIAGNOSIS — F411 Generalized anxiety disorder: Secondary | ICD-10-CM | POA: Insufficient documentation

## 2022-06-26 DIAGNOSIS — M79604 Pain in right leg: Secondary | ICD-10-CM | POA: Diagnosis not present

## 2022-06-26 DIAGNOSIS — G479 Sleep disorder, unspecified: Secondary | ICD-10-CM | POA: Insufficient documentation

## 2022-07-03 ENCOUNTER — Encounter: Payer: Self-pay | Admitting: Psychology

## 2022-07-03 NOTE — Progress Notes (Signed)
Neuropsychology Visit  Patient:  Mary Buck   DOB: February 24, 1936  MR Number: [id number]  Location: [facility] [place], [place] [id number] [place] [id number] Dept: [phone]  Date of Service: 06/26/2022  Start: 1 PM End: 2 PM  Today's visit was an in person visit was conducted in my outpatient clinic office.  The patient myself were present.  Duration of Service: 1 Hour  Provider/Observer:     Edgardo Roys PsyD  Chief Complaint:      Chief Complaint  Patient presents with   Pain   Anxiety   Depression   Stress    Reason For Service:     Mary Buck is an 86 year old female who has a history of anxiety and depressive symptomatology going back to adolescence.  Patient has had issues with insomnia since age 53 that were started around the time when she had an encounter with a snake and stayed up all night worrying that she had been bitten with a venomous bite despite absence of physical signs or symptoms.  OCD and anxiety have persisted throughout life.  Patient had a recent neuropsychological evaluation due to concerns of cognitive changes but did quite well on almost all neuropsychological measures and memory issues are likely related to anxiety and normal age-related changes.  The patient has been followed by Dr. Darol Destine for psychotherapeutic interventions and after he left the practice she is following up with myself.  Treatment Interventions:  Cognitive/behavioral psychotherapeutic interventions or any issues related to generalized anxiety and coping and stress.  Participation Level:   Active  Participation Quality:  Appropriate      Behavioral Observation:  Well Groomed, Alert, and Appropriate.   Current Psychosocial Factors: The patient reports that she has struggled with the concepts that she is going to be unable to effectively reduce her pain  and increase her physical strength.  The patient reports that she has seen a couple of doctors since our last visit and they essentially told her that she is getting older and will have to learn to live with her issues particularly those created by her back.  This is very difficult for her to manage and feels frustrated that her partner has to help out more physically and do more chores around the house.  Patient reports that she is having some memory retrieval issues but it does not appear to be related to encoding, storage organization of new information.  Content of Session:   Reviewed current symptoms and worked on therapeutic interventions around issues of anxiety and depressive symptomatology and coping with various stressors in her life.  Effectiveness of Interventions: Report has begun to be established and the patient has been open and engaging throughout the therapeutic efforts and we are working on transitioning from previous psychologist.  Target Goals:   We continue to work issues related to coping skills around her anxiety and adjustment to significant changes in physical functioning.  Patient continues with significant back pain and has an appointment coming up with her orthopedist.  Patient has been diagnosed with obstructive sleep apnea but has not been able to tolerate a CPAP machine when she was initially in the fitting.  And will also be important to try to get the patient to give the CPAP device another effort as it may have a beneficial effect for her anxiety, memory and depressive symptomatology.  Goals Last Reviewed:   06/26/2022  Goals  Addressed Today:    Today we continue to work on coping skills and strategies around anxiety and depressive types of symptoms and dealing with significant chronic pain issues and coping with aging.  Impression/Diagnosis:   Mary Buck is an 86 year old female who has a history of anxiety and depressive symptomatology going back to  adolescence.  Patient has had issues with insomnia since age 77 that were started around the time when she had an encounter with a snake and stayed up all night worrying that she had been bitten with a venomous bite despite absence of physical signs or symptoms.  OCD and anxiety have persisted throughout life.  Patient had a recent neuropsychological evaluation due to concerns of cognitive changes but did quite well on almost all neuropsychological measures and memory issues are likely related to anxiety and normal age-related changes.  The patient has been followed by Dr. Darol Destine for psychotherapeutic interventions and after he left the practice she is following up with myself.  The patient has an upcoming appointment with Dr. Posey Pronto who is an anesthesiologist about interventions that may be possible due to her chronic radicular lumbar pain symptoms.  Diagnosis:   Chronic pain syndrome  Pain in both lower extremities  OSA (obstructive sleep apnea)  Generalized anxiety disorder  Sleep disturbance    [name], Psy.D. Clinical Psychologist Neuropsychologist

## 2022-07-08 ENCOUNTER — Encounter (HOSPITAL_BASED_OUTPATIENT_CLINIC_OR_DEPARTMENT_OTHER): Payer: Self-pay

## 2022-07-08 ENCOUNTER — Other Ambulatory Visit: Payer: Self-pay | Admitting: Family Medicine

## 2022-07-08 ENCOUNTER — Ambulatory Visit (HOSPITAL_BASED_OUTPATIENT_CLINIC_OR_DEPARTMENT_OTHER)
Admission: RE | Admit: 2022-07-08 | Discharge: 2022-07-08 | Disposition: A | Discharge status: Home / Self Care | Payer: Medicare Other | Source: Ambulatory Visit | Attending: Family Medicine | Admitting: Family Medicine

## 2022-07-08 ENCOUNTER — Inpatient Hospital Stay (HOSPITAL_BASED_OUTPATIENT_CLINIC_OR_DEPARTMENT_OTHER): Admission: RE | Admit: 2022-07-08 | Payer: Medicare Other | Source: Ambulatory Visit

## 2022-07-08 DIAGNOSIS — Z1231 Encounter for screening mammogram for malignant neoplasm of breast: Secondary | ICD-10-CM | POA: Diagnosis not present

## 2022-07-10 ENCOUNTER — Other Ambulatory Visit: Payer: Self-pay | Admitting: Family Medicine

## 2022-07-10 DIAGNOSIS — R928 Other abnormal and inconclusive findings on diagnostic imaging of breast: Secondary | ICD-10-CM

## 2022-07-11 DIAGNOSIS — Z23 Encounter for immunization: Secondary | ICD-10-CM | POA: Diagnosis not present

## 2022-07-14 DIAGNOSIS — Z961 Presence of intraocular lens: Secondary | ICD-10-CM | POA: Diagnosis not present

## 2022-07-14 DIAGNOSIS — H524 Presbyopia: Secondary | ICD-10-CM | POA: Diagnosis not present

## 2022-07-14 DIAGNOSIS — H52203 Unspecified astigmatism, bilateral: Secondary | ICD-10-CM | POA: Diagnosis not present

## 2022-07-14 DIAGNOSIS — H43813 Vitreous degeneration, bilateral: Secondary | ICD-10-CM | POA: Diagnosis not present

## 2022-07-14 DIAGNOSIS — H18523 Epithelial (juvenile) corneal dystrophy, bilateral: Secondary | ICD-10-CM | POA: Diagnosis not present

## 2022-07-14 DIAGNOSIS — H35373 Puckering of macula, bilateral: Secondary | ICD-10-CM | POA: Diagnosis not present

## 2022-07-14 DIAGNOSIS — H5203 Hypermetropia, bilateral: Secondary | ICD-10-CM | POA: Diagnosis not present

## 2022-07-14 DIAGNOSIS — H04123 Dry eye syndrome of bilateral lacrimal glands: Secondary | ICD-10-CM | POA: Diagnosis not present

## 2022-07-22 ENCOUNTER — Ambulatory Visit
Admission: RE | Admit: 2022-07-22 | Discharge: 2022-07-22 | Disposition: A | Discharge status: Home / Self Care | Payer: Medicare Other | Source: Ambulatory Visit | Attending: Family Medicine | Admitting: Family Medicine

## 2022-07-22 ENCOUNTER — Ambulatory Visit: Admission: RE | Admit: 2022-07-22 | Payer: Medicare Other | Source: Ambulatory Visit

## 2022-07-22 DIAGNOSIS — R928 Other abnormal and inconclusive findings on diagnostic imaging of breast: Secondary | ICD-10-CM | POA: Diagnosis not present

## 2022-07-29 DIAGNOSIS — R2689 Other abnormalities of gait and mobility: Secondary | ICD-10-CM | POA: Diagnosis not present

## 2022-08-04 ENCOUNTER — Telehealth: Payer: Self-pay | Admitting: Family Medicine

## 2022-08-04 ENCOUNTER — Encounter: Payer: Medicare Other | Attending: Psychology | Admitting: Psychology

## 2022-08-04 DIAGNOSIS — G4733 Obstructive sleep apnea (adult) (pediatric): Secondary | ICD-10-CM | POA: Insufficient documentation

## 2022-08-04 DIAGNOSIS — G479 Sleep disorder, unspecified: Secondary | ICD-10-CM | POA: Insufficient documentation

## 2022-08-04 DIAGNOSIS — M79605 Pain in left leg: Secondary | ICD-10-CM | POA: Insufficient documentation

## 2022-08-04 DIAGNOSIS — M79604 Pain in right leg: Secondary | ICD-10-CM | POA: Insufficient documentation

## 2022-08-04 DIAGNOSIS — G894 Chronic pain syndrome: Secondary | ICD-10-CM | POA: Diagnosis not present

## 2022-08-04 DIAGNOSIS — F411 Generalized anxiety disorder: Secondary | ICD-10-CM | POA: Diagnosis not present

## 2022-08-04 DIAGNOSIS — G8929 Other chronic pain: Secondary | ICD-10-CM | POA: Diagnosis not present

## 2022-08-04 NOTE — Telephone Encounter (Signed)
Spoke to patient schedule Medicare Annual Wellness Visit (AWV) either virtually or phone.   Patient wanted a call back 08/05/22 she was going to another appt right now and could not talk    Last AWV 07/30/21  please schedule with Nurse Health Adviser  2   45 min for awv-i and in office appointments 30 min for awv-s  phone/virtual appointments

## 2022-08-10 ENCOUNTER — Encounter: Payer: Self-pay | Admitting: Psychology

## 2022-08-10 NOTE — Progress Notes (Signed)
Neuropsychology Visit  Patient:  Mary Buck   DOB: 11-04-1935  MR Number: [id number]  Location: [facility] [place], [place] [id number] [place] [id number] Dept: [phone]  Date of Service: 06/26/2022  Start: 1 PM End: 2 PM  Today's visit was an in person visit was conducted in my outpatient clinic office.  The patient myself were present.  Duration of Service: 1 Hour  Provider/Observer:     Edgardo Roys PsyD  Chief Complaint:      Chief Complaint  Patient presents with   Pain   Anxiety   Stress    Reason For Service:     Mary Buck is an 86 year old female who has a history of anxiety and depressive symptomatology going back to adolescence.  Patient has had issues with insomnia since age 53 that were started around the time when she had an encounter with a snake and stayed up all night worrying that she had been bitten with a venomous bite despite absence of physical signs or symptoms.  OCD and anxiety have persisted throughout life.  Patient had a recent neuropsychological evaluation due to concerns of cognitive changes but did quite well on almost all neuropsychological measures and memory issues are likely related to anxiety and normal age-related changes.  The patient has been followed by Dr. Darol Destine for psychotherapeutic interventions and after he left the practice she is following up with myself.  Treatment Interventions:  Cognitive/behavioral psychotherapeutic interventions or any issues related to generalized anxiety and coping and stress.  Participation Level:   Active  Participation Quality:  Appropriate      Behavioral Observation:  Well Groomed, Alert, and Appropriate.   Current Psychosocial Factors: The patient reports that she has struggled with the concepts that she is going to be unable to effectively reduce her pain and increase  her physical strength.  The patient reports that she has seen a couple of doctors since our last visit and they essentially told her that she is getting older and will have to learn to live with her issues particularly those created by her back.  This is very difficult for her to manage and feels frustrated that her partner has to help out more physically and do more chores around the house.  Patient reports that she is having some memory retrieval issues but it does not appear to be related to encoding, storage organization of new information.  Content of Session:   Reviewed current symptoms and worked on therapeutic interventions around issues of anxiety and depressive symptomatology and coping with various stressors in her life.  Effectiveness of Interventions: Report has begun to be established and the patient has been open and engaging throughout the therapeutic efforts and we are working on transitioning from previous psychologist.  Target Goals:   We continue to work issues related to coping skills around her anxiety and adjustment to significant changes in physical functioning.  Patient continues with significant back pain and has an appointment coming up with her orthopedist.  Patient has been diagnosed with obstructive sleep apnea but has not been able to tolerate a CPAP machine when she was initially in the fitting.  And will also be important to try to get the patient to give the CPAP device another effort as it may have a beneficial effect for her anxiety, memory and depressive symptomatology.  Goals Last Reviewed:   06/26/2022  Goals Addressed Today:  Today we continue to work on coping skills and strategies around anxiety and depressive types of symptoms and dealing with significant chronic pain issues and coping with aging.  Impression/Diagnosis:   Mary Buck. Kisamore is an 86 year old female who has a history of anxiety and depressive symptomatology going back to adolescence.  Patient  has had issues with insomnia since age 63 that were started around the time when she had an encounter with a snake and stayed up all night worrying that she had been bitten with a venomous bite despite absence of physical signs or symptoms.  OCD and anxiety have persisted throughout life.  Patient had a recent neuropsychological evaluation due to concerns of cognitive changes but did quite well on almost all neuropsychological measures and memory issues are likely related to anxiety and normal age-related changes.  The patient has been followed by Dr. Darol Destine for psychotherapeutic interventions and after he left the practice she is following up with myself.  The patient has an upcoming appointment with Dr. Posey Pronto who is an anesthesiologist about interventions that may be possible due to her chronic radicular lumbar pain symptoms.  Diagnosis:   Chronic pain syndrome  Pain in both lower extremities  OSA (obstructive sleep apnea)  Generalized anxiety disorder  Sleep disturbance  Other chronic pain    [name], Psy.D. Clinical Psychologist Neuropsychologist

## 2022-08-12 DIAGNOSIS — R2689 Other abnormalities of gait and mobility: Secondary | ICD-10-CM | POA: Diagnosis not present

## 2022-08-13 NOTE — Patient Instructions (Incomplete)
It was great to see you again today- we will be in touch with your labs and will set you up for a foot circulation test!   Assuming all is well please see me in about 4 months

## 2022-08-13 NOTE — Progress Notes (Unsigned)
[facility] at [facility] [address] [place], [place] [address] [phone]  Date:  08/14/2022   Name:  Mary Buck   DOB:  09/07/1936   MRN:  [id number]  PCP:  Darreld Mclean, MD    Chief Complaint: No chief complaint on file.   History of Present Illness:  Mary Buck is a 86 y.o. very pleasant female patient who presents with the following:  Patient seen today with concern of stomach upset Most recent visit with myself was in September  history of significant skin cancer problems, bladder cancer, spinal stenosis, hypothyroidism, hyperlipidemia, peripheral neuropathy, neurogenic claudication, squamous cell cancer to the left leg status post or with radiation   Unfortunately she has persistent pain from her spinal stenosis and neurogenic claudication.  She does see pain management, had a percutaneous decompression this past January She has also been seeing a counselor for her chronic pain Flu vaccine Recommend updated COVID shot    Patient Active Problem List   Diagnosis Date Noted   Thyroid disease    Heart murmur    GERD (gastroesophageal reflux disease)    Depression    Cataract    Cancer ([place])    Asthma    Allergy    IDA (iron deficiency anemia) 05/31/2020   Osteopenia 05/30/2019   Pedal edema 01/31/2018   Squamous cell carcinoma of skin of left lower limb, including hip 12/16/2017   Plantar fasciitis 07/07/2017   DDD (degenerative disc disease), lumbar 06/01/2017   Fibromyalgia 06/01/2017   Spinal stenosis at L4-L5 level 04/30/2017   Peripheral neuropathy 04/30/2017   Encounter for therapeutic drug monitoring 03/05/2017   Bladder cancer ([place]) 08/20/2015   Arthropathy of lumbar facet joint 07/27/2015   Spinal stenosis of cervical region 07/27/2015   Spondylolisthesis 07/27/2015   Hypothyroid 07/05/2015   Abdominal pain, acute, left upper quadrant 06/28/2015   Basal cell carcinoma, face  06/28/2015   Bilateral groin pain 06/28/2015   Chronic pain 06/28/2015   Fatigue 06/28/2015   Hyperlipidemia 06/28/2015   Bilateral hip pain 06/28/2015   Paresthesia of lower lip 06/28/2015   Pulmonary nodule 06/28/2015   Pustulosis palmaris et plantaris 06/28/2015   Ulcer of nose 06/28/2015   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   Mass of parotid gland 03/24/2014    Past Medical History:  Diagnosis Date   Allergy    Asthma    Cancer ([place])    Cataract    DDD (degenerative disc disease), lumbar 06/01/2017   Depression    GERD (gastroesophageal reflux disease)    Heart murmur    Hyperlipidemia    IDA (iron deficiency anemia) 05/31/2020   Peripheral neuropathy 04/30/2017   Thyroid disease     Past Surgical History:  Procedure Laterality Date   bladder cancer  2013   CARPAL TUNNEL RELEASE  2015   CATARACT EXTRACTION Bilateral 2012   CHOLECYSTECTOMY     COLONOSCOPY WITH ESOPHAGOGASTRODUODENOSCOPY (EGD)  02/22/2018   [facility] Left 2015   benign   SPINE SURGERY  6/31/21   vertaflex   TONSILLECTOMY AND ADENOIDECTOMY  1943    Social History   Tobacco Use   Smoking status: Former    Types: Cigarettes    Quit date: 2015    Years since quitting: 8.9   Smokeless tobacco: Never  Vaping Use   Vaping Use: Never used  Substance Use Topics   Alcohol use:  No   Drug use: No    Family History  Problem Relation Age of Onset   Macular degeneration Mother    Hyperlipidemia Father    Stroke Father    Cancer Brother        ? stomach   Prostate cancer Other        in brothers   Colon cancer Neg Hx     Allergies  Allergen Reactions   Contrast Media [Iodinated Contrast Media] Anaphylaxis   Oxycodone Anaphylaxis    "my throat swells with any opioid"   Latex Other (See Comments)    Other reaction(s): blistering, redness    Mirtazapine Nausea Only   Acitretin Rash   Cephalexin Other (See Comments)    Unknown   Elemental  Sulfur Other (See Comments)    unknown   Penicillins Other (See Comments)    Unknown   Sulfa Antibiotics Other (See Comments)    Unknown    Medication list has been reviewed and updated.  Current Outpatient Medications on File Prior to Visit  Medication Sig Dispense Refill   Cholecalciferol (VITAMIN D-3 PO) Take 1,000 Units by mouth daily.     famotidine (PEPCID) 20 MG tablet TAKE 1 TABLET(20 MG) BY MOUTH AT BEDTIME 90 tablet 3   gabapentin (NEURONTIN) 300 MG capsule Take 1 capsule (300 mg total) by mouth 3 (three) times daily. 90 capsule 4   lansoprazole (PREVACID) 30 MG capsule TAKE 1 CAPSULE(30 MG) BY MOUTH TWICE DAILY BEFORE A MEAL 180 capsule 1   levothyroxine (SYNTHROID) 88 MCG tablet TAKE 1 TABLET(88 MCG) BY MOUTH DAILY BEFORE BREAKFAST 90 tablet 1   misoprostol (CYTOTEC) 100 MCG tablet TAKE 2 TABLETS BY MOUTH AFTER BREAKFAST AND AFTER DINNER AS DIRECTED 120 tablet 5   nystatin (MYCOSTATIN/NYSTOP) powder Apply 1 Application topically 3 (three) times daily. 30 g 1   simvastatin (ZOCOR) 20 MG tablet TAKE 1 TABLET(20 MG) BY MOUTH DAILY 90 tablet 3   traMADol (ULTRAM) 50 MG tablet Take 50 mg by mouth 3 (three) times daily.     No current facility-administered medications on file prior to visit.    Review of Systems:  As per HPI- otherwise negative.   Physical Examination: There were no vitals filed for this visit. There were no vitals filed for this visit. There is no height or weight on file to calculate BMI. Ideal Body Weight:    GEN: no acute distress. HEENT: Atraumatic, Normocephalic.  Ears and Nose: No external deformity. CV: RRR, No M/G/R. No JVD. No thrill. No extra heart sounds. PULM: CTA B, no wheezes, crackles, rhonchi. No retractions. No resp. distress. No accessory muscle use. ABD: S, NT, ND, +BS. No rebound. No HSM. EXTR: No c/c/e PSYCH: Normally interactive. Conversant.    Assessment and Plan: ***  Signed Lamar Blinks, MD

## 2022-08-14 ENCOUNTER — Encounter: Payer: Self-pay | Admitting: Family Medicine

## 2022-08-14 ENCOUNTER — Ambulatory Visit (INDEPENDENT_AMBULATORY_CARE_PROVIDER_SITE_OTHER): Payer: Medicare Other | Admitting: Family Medicine

## 2022-08-14 VITALS — BP 124/70 | HR 101 | Temp 97.7°F | Resp 18 | Ht 63.0 in | Wt 162.4 lb

## 2022-08-14 DIAGNOSIS — I739 Peripheral vascular disease, unspecified: Secondary | ICD-10-CM

## 2022-08-14 DIAGNOSIS — Z5181 Encounter for therapeutic drug level monitoring: Secondary | ICD-10-CM | POA: Diagnosis not present

## 2022-08-14 DIAGNOSIS — E039 Hypothyroidism, unspecified: Secondary | ICD-10-CM

## 2022-08-14 DIAGNOSIS — R2689 Other abnormalities of gait and mobility: Secondary | ICD-10-CM | POA: Diagnosis not present

## 2022-08-14 LAB — BASIC METABOLIC PANEL
BUN: 14 mg/dL (ref 6–23)
CO2: 32 mEq/L (ref 19–32)
Calcium: 8.8 mg/dL (ref 8.4–10.5)
Chloride: 104 mEq/L (ref 96–112)
Creatinine, Ser: 0.87 mg/dL (ref 0.40–1.20)
GFR: 61.39 mL/min (ref >60.00)
Glucose, Bld: 84 mg/dL (ref 70–99)
Potassium: 3.8 mEq/L (ref 3.5–5.1)
Sodium: 142 mEq/L (ref 135–145)

## 2022-08-14 LAB — TSH: TSH: 2.61 u[IU]/mL (ref 0.35–5.50)

## 2022-08-14 LAB — CBC
HCT: 38.9 % (ref 36.0–46.0)
Hemoglobin: 12.8 g/dL (ref 12.0–15.0)
MCHC: 33 g/dL (ref 30.0–36.0)
MCV: 94.8 fl (ref 78.0–100.0)
Platelets: 250 10*3/uL (ref 150.0–400.0)
RBC: 4.11 Mil/uL (ref 3.87–5.11)
RDW: 14.6 % (ref 11.5–15.5)
WBC: 7.1 10*3/uL (ref 4.0–10.5)

## 2022-08-18 ENCOUNTER — Ambulatory Visit (HOSPITAL_COMMUNITY)
Admission: RE | Admit: 2022-08-18 | Discharge: 2022-08-18 | Disposition: A | Discharge status: Home / Self Care | Payer: Medicare Other | Source: Ambulatory Visit | Attending: Family Medicine | Admitting: Family Medicine

## 2022-08-18 ENCOUNTER — Encounter: Payer: Self-pay | Admitting: Family Medicine

## 2022-08-18 DIAGNOSIS — I739 Peripheral vascular disease, unspecified: Secondary | ICD-10-CM | POA: Diagnosis not present

## 2022-08-21 DIAGNOSIS — R2689 Other abnormalities of gait and mobility: Secondary | ICD-10-CM | POA: Diagnosis not present

## 2022-08-26 DIAGNOSIS — M48062 Spinal stenosis, lumbar region with neurogenic claudication: Secondary | ICD-10-CM | POA: Diagnosis not present

## 2022-08-26 DIAGNOSIS — M2550 Pain in unspecified joint: Secondary | ICD-10-CM | POA: Diagnosis not present

## 2022-08-26 DIAGNOSIS — G894 Chronic pain syndrome: Secondary | ICD-10-CM | POA: Diagnosis not present

## 2022-08-27 ENCOUNTER — Encounter: Payer: Medicare Other | Attending: Psychology | Admitting: Psychology

## 2022-08-27 DIAGNOSIS — G4733 Obstructive sleep apnea (adult) (pediatric): Secondary | ICD-10-CM

## 2022-08-27 DIAGNOSIS — M79605 Pain in left leg: Secondary | ICD-10-CM | POA: Diagnosis not present

## 2022-08-27 DIAGNOSIS — G894 Chronic pain syndrome: Secondary | ICD-10-CM | POA: Diagnosis not present

## 2022-08-27 DIAGNOSIS — M79604 Pain in right leg: Secondary | ICD-10-CM | POA: Diagnosis not present

## 2022-08-27 DIAGNOSIS — G479 Sleep disorder, unspecified: Secondary | ICD-10-CM

## 2022-08-27 DIAGNOSIS — F411 Generalized anxiety disorder: Secondary | ICD-10-CM

## 2022-08-27 DIAGNOSIS — F331 Major depressive disorder, recurrent, moderate: Secondary | ICD-10-CM

## 2022-08-28 DIAGNOSIS — R2689 Other abnormalities of gait and mobility: Secondary | ICD-10-CM | POA: Diagnosis not present

## 2022-08-31 ENCOUNTER — Encounter: Payer: Self-pay | Admitting: Psychology

## 2022-08-31 NOTE — Progress Notes (Signed)
Neuropsychology Visit  Patient:  Mary Buck   DOB: 12/17/1935  MR Number: [id number]  Location: [facility] [place], [place] [id number] [place] [address] Dept: [phone]  Date of Service: 08/27/2022  Start: 11 AM End: 12 PM  Today's visit was an in person visit was conducted in my outpatient clinic office.  The patient myself were present.  Duration of Service: 1 Hour  Provider/Observer:     Edgardo Roys PsyD  Chief Complaint:      Chief Complaint  Patient presents with   Depression   Stress   Pain    Reason For Service:     Mary Buck is an 86 year old female who has a history of anxiety and depressive symptomatology going back to adolescence.  Patient has had issues with insomnia since age 89 that were started around the time when she had an encounter with a snake and stayed up all night worrying that she had been bitten with a venomous bite despite absence of physical signs or symptoms.  OCD and anxiety have persisted throughout life.  Patient had a recent neuropsychological evaluation due to concerns of cognitive changes but did quite well on almost all neuropsychological measures and memory issues are likely related to anxiety and normal age-related changes.  The patient has been followed by Dr. Darol Destine for psychotherapeutic interventions and after he left the practice she is following up with myself.  Treatment Interventions:  Cognitive/behavioral psychotherapeutic interventions or any issues related to generalized anxiety and coping and stress.  Participation Level:   Active  Participation Quality:  Appropriate      Behavioral Observation:  Well Groomed, Alert, and Appropriate.   Current Psychosocial Factors: The patient reports that she has been feeling more anhedonic lately and has lost motivation to do things outside of her home for the  most part.  The patient reports that pain is playing a major role in these issues.  The patient reports that she has not been motivated to reach out to friends like she would have traditionally.  Significant pain and physical limitations continue.  Content of Session:   Reviewed current symptoms and worked on therapeutic interventions around issues of anxiety and depressive symptomatology and coping with various stressors in her life.  Effectiveness of Interventions: Report has begun to be established and the patient has been open and engaging throughout the therapeutic efforts and we are working on transitioning from previous psychologist.  Target Goals:   We continue to work issues related to coping skills around her anxiety and adjustment to significant changes in physical functioning.  Patient continues with significant back pain and has an appointment coming up with her orthopedist.  Patient has been diagnosed with obstructive sleep apnea but has not been able to tolerate a CPAP machine when she was initially in the fitting.  And will also be important to try to get the patient to give the CPAP device another effort as it may have a beneficial effect for her anxiety, memory and depressive symptomatology.  Goals Last Reviewed:   08/27/2022  Goals Addressed Today:    Today we continue to work on coping skills and strategies around anxiety and depressive types of symptoms and dealing with significant chronic pain issues and coping with aging.  Impression/Diagnosis:   Mary Buck is an 86 year old female who has a history of anxiety and depressive symptomatology going back to adolescence.  Patient  has had issues with insomnia since age 77 that were started around the time when she had an encounter with a snake and stayed up all night worrying that she had been bitten with a venomous bite despite absence of physical signs or symptoms.  OCD and anxiety have persisted throughout life.  Patient had a  recent neuropsychological evaluation due to concerns of cognitive changes but did quite well on almost all neuropsychological measures and memory issues are likely related to anxiety and normal age-related changes.  The patient has been followed by Dr. Darol Destine for psychotherapeutic interventions and after he left the practice she is following up with myself.  The patient has an upcoming appointment with Dr. Posey Pronto who is an anesthesiologist about interventions that may be possible due to her chronic radicular lumbar pain symptoms.  Diagnosis:   Moderate episode of recurrent major depressive disorder (HCC)  Generalized anxiety disorder  Chronic pain syndrome  OSA (obstructive sleep apnea)  Pain in both lower extremities  Sleep disturbance    [name], Psy.D. Clinical Psychologist Neuropsychologist

## 2022-09-22 ENCOUNTER — Encounter: Payer: Medicare Other | Attending: Psychology | Admitting: Psychology

## 2022-09-22 DIAGNOSIS — L570 Actinic keratosis: Secondary | ICD-10-CM | POA: Diagnosis not present

## 2022-09-22 DIAGNOSIS — F331 Major depressive disorder, recurrent, moderate: Secondary | ICD-10-CM

## 2022-09-22 DIAGNOSIS — G894 Chronic pain syndrome: Secondary | ICD-10-CM

## 2022-09-22 DIAGNOSIS — M79604 Pain in right leg: Secondary | ICD-10-CM | POA: Diagnosis not present

## 2022-09-22 DIAGNOSIS — L720 Epidermal cyst: Secondary | ICD-10-CM | POA: Diagnosis not present

## 2022-09-22 DIAGNOSIS — Z85828 Personal history of other malignant neoplasm of skin: Secondary | ICD-10-CM | POA: Diagnosis not present

## 2022-09-22 DIAGNOSIS — F411 Generalized anxiety disorder: Secondary | ICD-10-CM

## 2022-09-22 DIAGNOSIS — G4733 Obstructive sleep apnea (adult) (pediatric): Secondary | ICD-10-CM | POA: Insufficient documentation

## 2022-09-22 DIAGNOSIS — M79605 Pain in left leg: Secondary | ICD-10-CM | POA: Diagnosis not present

## 2022-09-22 DIAGNOSIS — G479 Sleep disorder, unspecified: Secondary | ICD-10-CM | POA: Diagnosis not present

## 2022-09-22 DIAGNOSIS — D692 Other nonthrombocytopenic purpura: Secondary | ICD-10-CM | POA: Diagnosis not present

## 2022-09-22 DIAGNOSIS — L821 Other seborrheic keratosis: Secondary | ICD-10-CM | POA: Diagnosis not present

## 2022-09-22 DIAGNOSIS — L905 Scar conditions and fibrosis of skin: Secondary | ICD-10-CM | POA: Diagnosis not present

## 2022-09-29 ENCOUNTER — Ambulatory Visit: Payer: Medicare Other | Admitting: Family Medicine

## 2022-10-02 DIAGNOSIS — R2689 Other abnormalities of gait and mobility: Secondary | ICD-10-CM | POA: Diagnosis not present

## 2022-10-02 DIAGNOSIS — M48062 Spinal stenosis, lumbar region with neurogenic claudication: Secondary | ICD-10-CM | POA: Diagnosis not present

## 2022-10-06 DIAGNOSIS — M48062 Spinal stenosis, lumbar region with neurogenic claudication: Secondary | ICD-10-CM | POA: Diagnosis not present

## 2022-10-06 DIAGNOSIS — R2689 Other abnormalities of gait and mobility: Secondary | ICD-10-CM | POA: Diagnosis not present

## 2022-10-13 ENCOUNTER — Other Ambulatory Visit: Payer: Self-pay | Admitting: Family Medicine

## 2022-10-13 DIAGNOSIS — M48062 Spinal stenosis, lumbar region with neurogenic claudication: Secondary | ICD-10-CM | POA: Diagnosis not present

## 2022-10-13 DIAGNOSIS — K219 Gastro-esophageal reflux disease without esophagitis: Secondary | ICD-10-CM

## 2022-10-13 DIAGNOSIS — R2689 Other abnormalities of gait and mobility: Secondary | ICD-10-CM | POA: Diagnosis not present

## 2022-10-15 DIAGNOSIS — G894 Chronic pain syndrome: Secondary | ICD-10-CM | POA: Diagnosis not present

## 2022-10-15 DIAGNOSIS — M25562 Pain in left knee: Secondary | ICD-10-CM | POA: Diagnosis not present

## 2022-10-15 DIAGNOSIS — M48062 Spinal stenosis, lumbar region with neurogenic claudication: Secondary | ICD-10-CM | POA: Diagnosis not present

## 2022-10-15 DIAGNOSIS — M25561 Pain in right knee: Secondary | ICD-10-CM | POA: Diagnosis not present

## 2022-10-20 ENCOUNTER — Encounter: Payer: Medicare Other | Attending: Psychology | Admitting: Psychology

## 2022-10-20 DIAGNOSIS — F331 Major depressive disorder, recurrent, moderate: Secondary | ICD-10-CM

## 2022-10-20 DIAGNOSIS — F411 Generalized anxiety disorder: Secondary | ICD-10-CM

## 2022-10-20 DIAGNOSIS — G894 Chronic pain syndrome: Secondary | ICD-10-CM | POA: Diagnosis not present

## 2022-10-21 ENCOUNTER — Telehealth: Payer: Self-pay | Admitting: Family Medicine

## 2022-10-21 NOTE — Telephone Encounter (Signed)
Copied from [place] [id number]. Topic: Medicare AWV >> Oct 21, 2022 10:24 AM Devoria Glassing wrote: Reason for CRM: Left message for patient to schedule Annual Wellness Visit(AWV).  Please schedule with Health Nurse Advisor at [facility]. Please call [phone] ask for [facility].

## 2022-10-29 ENCOUNTER — Inpatient Hospital Stay: Payer: Medicare Other | Attending: Hematology & Oncology

## 2022-10-29 ENCOUNTER — Other Ambulatory Visit: Payer: Self-pay | Admitting: Family

## 2022-10-29 ENCOUNTER — Encounter: Payer: Self-pay | Admitting: Family

## 2022-10-29 ENCOUNTER — Inpatient Hospital Stay (HOSPITAL_BASED_OUTPATIENT_CLINIC_OR_DEPARTMENT_OTHER): Payer: Medicare Other | Admitting: Family

## 2022-10-29 VITALS — BP 146/63 | HR 64 | Temp 98.3°F | Resp 18 | Wt 162.1 lb

## 2022-10-29 DIAGNOSIS — D649 Anemia, unspecified: Secondary | ICD-10-CM

## 2022-10-29 DIAGNOSIS — D509 Iron deficiency anemia, unspecified: Secondary | ICD-10-CM | POA: Insufficient documentation

## 2022-10-29 DIAGNOSIS — H5461 Unqualified visual loss, right eye, normal vision left eye: Secondary | ICD-10-CM | POA: Insufficient documentation

## 2022-10-29 LAB — CBC WITH DIFFERENTIAL (CANCER CENTER ONLY)
Abs Immature Granulocytes: 0.02 10*3/uL (ref 0.00–0.07)
Basophils Absolute: 0.1 10*3/uL (ref 0.0–0.1)
Basophils Relative: 1 %
Eosinophils Absolute: 0.7 10*3/uL — ABNORMAL HIGH (ref 0.0–0.5)
Eosinophils Relative: 12 %
HCT: 39.4 % (ref 36.0–46.0)
Hemoglobin: 12.4 g/dL (ref 12.0–15.0)
Immature Granulocytes: 0 %
Lymphocytes Relative: 33 %
Lymphs Abs: 1.9 10*3/uL (ref 0.7–4.0)
MCH: 31.3 pg (ref 26.0–34.0)
MCHC: 31.5 g/dL (ref 30.0–36.0)
MCV: 99.5 fL (ref 80.0–100.0)
Monocytes Absolute: 0.5 10*3/uL (ref 0.1–1.0)
Monocytes Relative: 8 %
Neutro Abs: 2.6 10*3/uL (ref 1.7–7.7)
Neutrophils Relative %: 46 %
Platelet Count: 197 10*3/uL (ref 150–400)
RBC: 3.96 MIL/uL (ref 3.87–5.11)
RDW: 12.9 % (ref 11.5–15.5)
WBC Count: 5.6 10*3/uL (ref 4.0–10.5)
nRBC: 0 % (ref 0.0–0.2)

## 2022-10-29 LAB — FERRITIN: Ferritin: 25 ng/mL (ref 11–307)

## 2022-10-29 LAB — RETICULOCYTES
Immature Retic Fract: 9.3 % (ref 2.3–15.9)
RBC.: 3.98 MIL/uL (ref 3.87–5.11)
Retic Count, Absolute: 40.6 10*3/uL (ref 19.0–186.0)
Retic Ct Pct: 1 % (ref 0.4–3.1)

## 2022-10-29 NOTE — Progress Notes (Signed)
Hematology and Oncology Follow Up Visit  Mary Buck [id number] [birth year] 87 y.o. 10/29/2022   Principle Diagnosis:   Iron deficiency anemia    Current Therapy:        IV iron as indicated    Interim History:  Mary Buck is here today for follow-up. She notes fatigue. She states that she has not been sleeping well which leads to brain fog and lightheadedness. She has also had episodes of blurred vision in her left eye at times. She has not followed up with her PCP or sought urgent care so far. I did advise that she let her PCP know and contact EMS if it happens again to rule out possible stroke. No falls or syncope reported. She is ambulating with a cane for added support.  She also does chair yoga regularly for exercise.  No blood loss noted. No abnormal bruising, no petechiae.  No swelling in her extremities.  She has chronic back pain unchanged from baseline.  Appetite and hydration have remained good. Her weight is stable at 162 lbs.   ECOG Performance Status: 1 - Symptomatic but completely ambulatory  Medications:  Allergies as of 10/29/2022       Reactions   Contrast Media [iodinated Contrast Media] Anaphylaxis   Oxycodone Anaphylaxis   "my throat swells with any opioid"   Latex Other (See Comments)   Other reaction(s): blistering, redness    Mirtazapine Nausea Only   Acitretin Rash   Cephalexin Other (See Comments)   Unknown   Elemental Sulfur Other (See Comments)   unknown   Penicillins Other (See Comments)   Unknown   Sulfa Antibiotics Other (See Comments)   Unknown        Medication List        Accurate as of October 29, 2022  1:25 PM. If you have any questions, ask your nurse or doctor.          famotidine 20 MG tablet Commonly known as: PEPCID TAKE 1 TABLET(20 MG) BY MOUTH AT BEDTIME   gabapentin 300 MG capsule Commonly known as: NEURONTIN Take 1 capsule (300 mg total) by mouth 3 (three) times daily.   lansoprazole 30 MG  capsule Commonly known as: PREVACID TAKE 1 CAPSULE(30 MG) BY MOUTH TWICE DAILY BEFORE A MEAL   levothyroxine 88 MCG tablet Commonly known as: SYNTHROID TAKE 1 TABLET(88 MCG) BY MOUTH DAILY BEFORE BREAKFAST   misoprostol 100 MCG tablet Commonly known as: CYTOTEC TAKE 2 TABLETS BY MOUTH AFTER BREAKFAST AND AFTER DINNER AS DIRECTED   nystatin powder Commonly known as: MYCOSTATIN/NYSTOP Apply 1 Application topically 3 (three) times daily.   simvastatin 20 MG tablet Commonly known as: ZOCOR TAKE 1 TABLET(20 MG) BY MOUTH DAILY   traMADol 50 MG tablet Commonly known as: ULTRAM Take 50 mg by mouth 3 (three) times daily.   VITAMIN D-3 PO Take 1,000 Units by mouth daily.        Allergies:  Allergies  Allergen Reactions   Contrast Media [Iodinated Contrast Media] Anaphylaxis   Oxycodone Anaphylaxis    "my throat swells with any opioid"   Latex Other (See Comments)    Other reaction(s): blistering, redness    Mirtazapine Nausea Only   Acitretin Rash   Cephalexin Other (See Comments)    Unknown   Elemental Sulfur Other (See Comments)    unknown   Penicillins Other (See Comments)    Unknown   Sulfa Antibiotics Other (See Comments)    Unknown    Past Medical  History, Surgical history, Social history, and Family History were reviewed and updated.  Review of Systems: All other 10 point review of systems is negative.   Physical Exam:  vitals were not taken for this visit.   Wt Readings from Last 3 Encounters:  08/14/22 162 lb 6.4 oz (73.7 kg)  04/09/22 163 lb (73.9 kg)  02/27/22 164 lb 12.8 oz (74.8 kg)    Ocular: Sclerae unicteric, pupils equal, round and reactive to light Ear-nose-throat: Oropharynx clear, dentition fair Lymphatic: No cervical or supraclavicular adenopathy Lungs no rales or rhonchi, good excursion bilaterally Heart regular rate and rhythm, no murmur appreciated Abd soft, nontender, positive bowel sounds MSK no focal spinal tenderness, no joint  edema Neuro: non-focal, well-oriented, appropriate affect Breasts: Deferred   Lab Results  Component Value Date   WBC 7.1 08/14/2022   HGB 12.8 08/14/2022   HCT 38.9 08/14/2022   MCV 94.8 08/14/2022   PLT 250.0 08/14/2022   Lab Results  Component Value Date   FERRITIN 57 10/28/2021   IRON 79 10/28/2021   TIBC 294 10/28/2021   UIBC 215 10/28/2021   IRONPCTSAT 27 10/28/2021   Lab Results  Component Value Date   RETICCTPCT 1.0 10/28/2021   RBC 4.11 08/14/2022   No results found for: "KPAFRELGTCHN", "LAMBDASER", "KAPLAMBRATIO" No results found for: "IGGSERUM", "IGA", "IGMSERUM" No results found for: "TOTALPROTELP", "ALBUMINELP", "A1GS", "A2GS", "BETS", "BETA2SER", "GAMS", "MSPIKE", "SPEI"   Chemistry      Component Value Date/Time   NA 142 08/14/2022 1205   K 3.8 08/14/2022 1205   CL 104 08/14/2022 1205   CO2 32 08/14/2022 1205   BUN 14 08/14/2022 1205   CREATININE 0.87 08/14/2022 1205   CREATININE 0.92 05/30/2020 1453      Component Value Date/Time   CALCIUM 8.8 08/14/2022 1205   ALKPHOS 71 02/27/2022 1143   AST 15 02/27/2022 1143   AST 15 05/30/2020 1453   ALT 10 02/27/2022 1143   ALT 10 05/30/2020 1453   BILITOT 0.2 02/27/2022 1143   BILITOT 0.4 05/30/2020 1453       Impression and Plan: Mary Buck is a very pleasant 87 yo female with history of iron deficiency anemia.  Iron studies are pending. We will replace if needed.  I will route today's note to patient's PCP so that they are aware of patient's above symptoms including intermittent vision loss in the right eye.  Follow-up in 1 year.   Lottie Dawson, NP 2/14/20241:25 PM

## 2022-10-30 LAB — IRON AND IRON BINDING CAPACITY (CC-WL,HP ONLY)
Iron: 71 ug/dL (ref 28–170)
Saturation Ratios: 24 % (ref 10.4–31.8)
TIBC: 298 ug/dL (ref 250–450)
UIBC: 227 ug/dL (ref 148–442)

## 2022-10-31 ENCOUNTER — Ambulatory Visit: Payer: Medicare Other | Admitting: Family

## 2022-10-31 ENCOUNTER — Other Ambulatory Visit: Payer: Medicare Other

## 2022-11-02 NOTE — Progress Notes (Signed)
Neuropsychology Visit  Patient:  Mary Buck   DOB: 1936/05/02  MR Number: [id number]  Location: [facility] [place], [place] [id number] [facility] [place] [address] Dept: [phone]  Date of Service: 10/20/2022  Start: 1 PM End: 2 PM  Today's visit was an in person visit was conducted in my outpatient clinic office.  The patient myself were present.  Duration of Service: 1 Hour  Provider/Observer:     Edgardo Roys PsyD  Chief Complaint:      Chief Complaint  Patient presents with   Depression   Sleeping Problem   Pain    Reason For Service:     Mary Buck is an 87 year old female who has a history of anxiety and depressive symptomatology going back to adolescence.  Patient has had issues with insomnia since age 65 that were started around the time when she had an encounter with a snake and stayed up all night worrying that she had been bitten with a venomous bite despite absence of physical signs or symptoms.  OCD and anxiety have persisted throughout life.  Patient had a recent neuropsychological evaluation due to concerns of cognitive changes but did quite well on almost all neuropsychological measures and memory issues are likely related to anxiety and normal age-related changes.  The patient has been followed by Dr. Darol Destine for psychotherapeutic interventions and after he left the practice she is following up with myself.  Treatment Interventions:  Cognitive/behavioral psychotherapeutic interventions or any issues related to generalized anxiety and coping and stress.  Participation Level:   Active  Participation Quality:  Appropriate      Behavioral Observation:  Well Groomed, Alert, and Appropriate.   Current Psychosocial Factors: The patient reports that she has been feeling more anhedonic lately and has lost motivation to do things outside of her home for  the most part.  The patient reports that pain is playing a major role in these issues.  The patient reports that she has not been motivated to reach out to friends like she would have traditionally.  Significant pain and physical limitations continue.  Content of Session:   Reviewed current symptoms and worked on therapeutic interventions around issues of anxiety and depressive symptomatology and coping with various stressors in her life.  Effectiveness of Interventions: Report has begun to be established and the patient has been open and engaging throughout the therapeutic efforts and we are working on transitioning from previous psychologist.  Target Goals:   We continue to work issues related to coping skills around her anxiety and adjustment to significant changes in physical functioning.  Patient continues with significant back pain and has an appointment coming up with her orthopedist.  Patient has been diagnosed with obstructive sleep apnea but has not been able to tolerate a CPAP machine when she was initially in the fitting.  And will also be important to try to get the patient to give the CPAP device another effort as it may have a beneficial effect for her anxiety, memory and depressive symptomatology.  Goals Last Reviewed:   10/20/2022  Goals Addressed Today:    Today we continue to work on coping skills and strategies around anxiety and depressive types of symptoms and dealing with significant chronic pain issues and coping with aging.  Impression/Diagnosis:   Mary Buck is an 87 year old female who has a history of anxiety and depressive symptomatology going back to adolescence.  Patient has had issues with insomnia since age 70 that were started around the time when she had an encounter with a snake and stayed up all night worrying that she had been bitten with a venomous bite despite absence of physical signs or symptoms.  OCD and anxiety have persisted throughout life.  Patient had  a recent neuropsychological evaluation due to concerns of cognitive changes but did quite well on almost all neuropsychological measures and memory issues are likely related to anxiety and normal age-related changes.  The patient has been followed by Dr. Darol Destine for psychotherapeutic interventions and after he left the practice she is following up with myself.  The patient has an upcoming appointment with Dr. Posey Pronto who is an anesthesiologist about interventions that may be possible due to her chronic radicular lumbar pain symptoms.  Diagnosis:   Moderate episode of recurrent major depressive disorder (HCC)  Generalized anxiety disorder  Chronic pain syndrome    [name], Psy.D. Clinical Psychologist Neuropsychologist

## 2022-11-02 NOTE — Progress Notes (Signed)
Neuropsychology Visit  Patient:  Mary Buck   DOB: [birth year]  MR Number: [id number]  Location: [facility] [place], [place] [id number] [facility] [place] [address] Dept: [phone]  Date of Service: 09/22/2022  Start: 1 PM End: 2 PM  Today's visit was an in person visit was conducted in my outpatient clinic office.  The patient myself were present.  Duration of Service: 1 Hour  Provider/Observer:     Edgardo Roys PsyD  Chief Complaint:      Chief Complaint  Patient presents with   Depression   Stress   Pain    Reason For Service:     Mary Buck is an 87 year old female who has a history of anxiety and depressive symptomatology going back to adolescence.  Patient has had issues with insomnia since age 45 that were started around the time when she had an encounter with a snake and stayed up all night worrying that she had been bitten with a venomous bite despite absence of physical signs or symptoms.  OCD and anxiety have persisted throughout life.  Patient had a recent neuropsychological evaluation due to concerns of cognitive changes but did quite well on almost all neuropsychological measures and memory issues are likely related to anxiety and normal age-related changes.  The patient has been followed by Dr. Darol Destine for psychotherapeutic interventions and after he left the practice she is following up with myself.  Treatment Interventions:  Cognitive/behavioral psychotherapeutic interventions or any issues related to generalized anxiety and coping and stress.  Participation Level:   Active  Participation Quality:  Appropriate      Behavioral Observation:  Well Groomed, Alert, and Appropriate.   Current Psychosocial Factors: The patient reports that she has been feeling more anhedonic lately and has lost motivation to do things outside of her home for the most  part.  The patient reports that pain is playing a major role in these issues.  The patient reports that she has not been motivated to reach out to friends like she would have traditionally.  Significant pain and physical limitations continue.  Content of Session:   Reviewed current symptoms and worked on therapeutic interventions around issues of anxiety and depressive symptomatology and coping with various stressors in her life.  Effectiveness of Interventions: Report has begun to be established and the patient has been open and engaging throughout the therapeutic efforts and we are working on transitioning from previous psychologist.  Target Goals:   We continue to work issues related to coping skills around her anxiety and adjustment to significant changes in physical functioning.  Patient continues with significant back pain and has an appointment coming up with her orthopedist.  Patient has been diagnosed with obstructive sleep apnea but has not been able to tolerate a CPAP machine when she was initially in the fitting.  And will also be important to try to get the patient to give the CPAP device another effort as it may have a beneficial effect for her anxiety, memory and depressive symptomatology.  Goals Last Reviewed:   09/22/2022  Goals Addressed Today:    Today we continue to work on coping skills and strategies around anxiety and depressive types of symptoms and dealing with significant chronic pain issues and coping with aging.  Impression/Diagnosis:   Mary Buck is an 87 year old female who has a history of anxiety and depressive symptomatology going back to adolescence.  Patient  has had issues with insomnia since age 29 that were started around the time when she had an encounter with a snake and stayed up all night worrying that she had been bitten with a venomous bite despite absence of physical signs or symptoms.  OCD and anxiety have persisted throughout life.  Patient had a recent  neuropsychological evaluation due to concerns of cognitive changes but did quite well on almost all neuropsychological measures and memory issues are likely related to anxiety and normal age-related changes.  The patient has been followed by Dr. Darol Destine for psychotherapeutic interventions and after he left the practice she is following up with myself.  The patient has an upcoming appointment with Dr. Posey Pronto who is an anesthesiologist about interventions that may be possible due to her chronic radicular lumbar pain symptoms.  Diagnosis:   Moderate episode of recurrent major depressive disorder (HCC)  Generalized anxiety disorder  Chronic pain syndrome    [name], Psy.D. Clinical Psychologist Neuropsychologist

## 2022-11-02 NOTE — Progress Notes (Signed)
[facility], [place], [place] [address] [phone]  Date:  11/10/2022   Name:  Mary Buck   DOB:  May 02, 1936   MRN:  [id number]  PCP:  Darreld Mclean, MD    Chief Complaint: Follow-up (Concerns/ questions: right hip pain since doing chair yoga this AM. Maren Reamer due/)   History of Present Illness:  Mary Buck is a 87 y.o. very pleasant female patient who presents with the following:  Seen today for recheck visit Last seen by myself 11/23-  History of significant skin cancer problems, bladder cancer, spinal stenosis, hypothyroidism, hyperlipidemia, peripheral neuropathy, neurogenic claudication, squamous cell cancer to the left leg status post or with radiation   Unfortunately she has persistent pain from her spinal stenosis and neurogenic claudication.  She does see pain management  Flu shot- done in the fall  Covid booster-patient reports this is up-to-date She notes increase in her right hip pain recently and is having a harder time walking around  She is having to use a cane She has noted the right hip pain for a few weeks - the left also bothers her some   She notes she may feel dizzy after she takes her TID medication She takes gabapentin, tramadol and tylenol TID  She does get a trigger finger on her right trigger finger- she is able to deal with this ok right now and can fix it on her own  She also has some pain in her left first MCP- likely OA    Lab Results  Component Value Date   TSH 2.61 08/14/2022    Patient Active Problem List   Diagnosis Date Noted   Thyroid disease    Heart murmur    GERD (gastroesophageal reflux disease)    Depression    Cataract    Cancer ([place])    Asthma    Allergy    IDA (iron deficiency anemia) 05/31/2020   Osteopenia 05/30/2019   Pedal edema 01/31/2018   Squamous cell carcinoma of skin of left lower limb, including hip 12/16/2017    Plantar fasciitis 07/07/2017   DDD (degenerative disc disease), lumbar 06/01/2017   Fibromyalgia 06/01/2017   Spinal stenosis at L4-L5 level 04/30/2017   Peripheral neuropathy 04/30/2017   Encounter for therapeutic drug monitoring 03/05/2017   Bladder cancer ([place]) 08/20/2015   Arthropathy of lumbar facet joint 07/27/2015   Spinal stenosis of cervical region 07/27/2015   Spondylolisthesis 07/27/2015   Hypothyroid 07/05/2015   Abdominal pain, acute, left upper quadrant 06/28/2015   Basal cell carcinoma, face 06/28/2015   Bilateral groin pain 06/28/2015   Chronic pain 06/28/2015   Fatigue 06/28/2015   Hyperlipidemia 06/28/2015   Bilateral hip pain 06/28/2015   Paresthesia of lower lip 06/28/2015   Pulmonary nodule 06/28/2015   Pustulosis palmaris et plantaris 06/28/2015   Ulcer of nose 06/28/2015   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   Mass of parotid gland 03/24/2014    Past Medical History:  Diagnosis Date   Allergy    Asthma    Cancer ([place])    Cataract    DDD (degenerative disc disease), lumbar 06/01/2017   Depression    GERD (gastroesophageal reflux disease)    Heart murmur    Hyperlipidemia    IDA (iron deficiency anemia) 05/31/2020   Peripheral neuropathy 04/30/2017   Thyroid disease     Past Surgical History:  Procedure Laterality Date   bladder cancer  2013  CARPAL TUNNEL RELEASE  2015   CATARACT EXTRACTION Bilateral 2012   CHOLECYSTECTOMY     COLONOSCOPY WITH ESOPHAGOGASTRODUODENOSCOPY (EGD)  02/22/2018   [facility] Left 2015   benign   SPINE SURGERY  6/31/21   vertaflex   TONSILLECTOMY AND ADENOIDECTOMY  1943    Social History   Tobacco Use   Smoking status: Former    Types: Cigarettes    Quit date: 2015    Years since quitting: 9.1   Smokeless tobacco: Never  Vaping Use   Vaping Use: Never used  Substance Use Topics   Alcohol use: No   Drug use: No    Family History  Problem Relation Age of Onset    Macular degeneration Mother    Hyperlipidemia Father    Stroke Father    Cancer Brother        ? stomach   Prostate cancer Other        in brothers   Colon cancer Neg Hx     Allergies  Allergen Reactions   Contrast Media [Iodinated Contrast Media] Anaphylaxis   Oxycodone Anaphylaxis    "my throat swells with any opioid"   Latex Other (See Comments)    Other reaction(s): blistering, redness    Mirtazapine Nausea Only   Acitretin Rash   Cephalexin Other (See Comments)    Unknown   Elemental Sulfur Other (See Comments)    unknown   Penicillins Other (See Comments)    Unknown   Sulfa Antibiotics Other (See Comments)    Unknown    Medication list has been reviewed and updated.  Current Outpatient Medications on File Prior to Visit  Medication Sig Dispense Refill   acetaminophen (TYLENOL) 325 MG tablet Take 650 mg by mouth at bedtime. As needed     Cholecalciferol (VITAMIN D-3 PO) Take 1,000 Units by mouth daily.     famotidine (PEPCID) 20 MG tablet TAKE 1 TABLET(20 MG) BY MOUTH AT BEDTIME 90 tablet 3   gabapentin (NEURONTIN) 300 MG capsule Take 1 capsule (300 mg total) by mouth 3 (three) times daily. 90 capsule 4   lansoprazole (PREVACID) 30 MG capsule TAKE 1 CAPSULE(30 MG) BY MOUTH TWICE DAILY BEFORE A MEAL 180 capsule 1   levothyroxine (SYNTHROID) 88 MCG tablet TAKE 1 TABLET(88 MCG) BY MOUTH DAILY BEFORE BREAKFAST 90 tablet 1   misoprostol (CYTOTEC) 100 MCG tablet TAKE 2 TABLETS BY MOUTH AFTER BREAKFAST AND AFTER DINNER AS DIRECTED 120 tablet 5   nystatin (MYCOSTATIN/NYSTOP) powder Apply 1 Application topically 3 (three) times daily. 30 g 1   simvastatin (ZOCOR) 20 MG tablet TAKE 1 TABLET(20 MG) BY MOUTH DAILY 90 tablet 3   traMADol (ULTRAM) 50 MG tablet Take 50 mg by mouth 3 (three) times daily.     No current facility-administered medications on file prior to visit.    Review of Systems:  As per HPI- otherwise negative.   Physical Examination: Vitals:    11/10/22 1348  BP: 124/80  Pulse: 64  Resp: 18  Temp: 97.7 F (36.5 C)  SpO2: 97%   Vitals:   11/10/22 1348  Weight: 161 lb 12.8 oz (73.4 kg)  Height: '5\' 3"'$  (1.6 m)   Body mass index is 28.66 kg/m. Ideal Body Weight: Weight in (lb) to have BMI = 25: 140.8  GEN: no acute distress. Mild overweight, looks well  HEENT: Atraumatic, Normocephalic.  Ears and Nose: No external deformity. CV: RRR, No M/G/R. No JVD. No  thrill. No extra heart sounds. PULM: CTA B, no wheezes, crackles, rhonchi. No retractions. No resp. distress. No accessory muscle use. EXTR: No c/c/e PSYCH: Normally interactive. Conversant.  B hips grossly display normal range of motion, she does have some discomfort with hip flexion bilaterally Thickening of the left first MCP joint consistent with osteoarthritis  Assessment and Plan: Bilateral hip pain - Plan: CANCELED: DG Hip Unilat W OR W/O Pelvis 2-3 Views Left, CANCELED: DG Hip Unilat W OR W/O Pelvis 2-3 Views Right  Primary osteoarthritis of left hand  Somnolence  Patient seen today for follow-up.  We discussed a few concerns.  Recommended that she try Voltaren gel for her arthritis pain, can try Pataday drops for her allergic conjunctivitis She notes pain in both hips, will obtain plain films of her bilateral hips today to judge degree of arthritis She also notes she tends to feel drowsy, we discussed tapering her gabapentin send to see if this may be helpful Signed Lamar Blinks, MD

## 2022-11-10 ENCOUNTER — Ambulatory Visit (HOSPITAL_BASED_OUTPATIENT_CLINIC_OR_DEPARTMENT_OTHER)
Admission: RE | Admit: 2022-11-10 | Discharge: 2022-11-10 | Disposition: A | Discharge status: Home / Self Care | Payer: Medicare Other | Source: Ambulatory Visit | Attending: Family Medicine | Admitting: Family Medicine

## 2022-11-10 ENCOUNTER — Ambulatory Visit: Payer: Medicare Other | Admitting: Family Medicine

## 2022-11-10 ENCOUNTER — Encounter (HOSPITAL_BASED_OUTPATIENT_CLINIC_OR_DEPARTMENT_OTHER): Payer: Self-pay

## 2022-11-10 ENCOUNTER — Ambulatory Visit (INDEPENDENT_AMBULATORY_CARE_PROVIDER_SITE_OTHER): Payer: Medicare Other | Admitting: Family Medicine

## 2022-11-10 ENCOUNTER — Other Ambulatory Visit: Payer: Self-pay | Admitting: Family Medicine

## 2022-11-10 VITALS — BP 124/80 | HR 64 | Temp 97.7°F | Resp 18 | Ht 63.0 in | Wt 161.8 lb

## 2022-11-10 DIAGNOSIS — R400 Somnolence: Secondary | ICD-10-CM | POA: Diagnosis not present

## 2022-11-10 DIAGNOSIS — M25551 Pain in right hip: Secondary | ICD-10-CM

## 2022-11-10 DIAGNOSIS — M25552 Pain in left hip: Secondary | ICD-10-CM

## 2022-11-10 DIAGNOSIS — M19042 Primary osteoarthritis, left hand: Secondary | ICD-10-CM

## 2022-11-10 NOTE — Patient Instructions (Addendum)
Please stop by the imaging dept on the ground floor to have hip x-rays We might try scaling back on your gabapentin to see if you are less drowsy Perhaps try skipping the am dose for a week or so and see if you miss it.  If your pain is not increased you might try skipping the mid-day dose as well   Voltaren gel may help for your hand pain, or biofreeze   Pataday drops may help your eyex- OTC

## 2022-11-12 ENCOUNTER — Encounter: Payer: Self-pay | Admitting: Family Medicine

## 2022-11-12 DIAGNOSIS — M25551 Pain in right hip: Secondary | ICD-10-CM

## 2022-11-12 NOTE — Addendum Note (Signed)
Addended by: Lamar Blinks C on: 11/12/2022 12:58 PM   Modules accepted: Orders

## 2022-11-13 ENCOUNTER — Other Ambulatory Visit (HOSPITAL_BASED_OUTPATIENT_CLINIC_OR_DEPARTMENT_OTHER): Payer: Self-pay

## 2022-11-13 ENCOUNTER — Ambulatory Visit (INDEPENDENT_AMBULATORY_CARE_PROVIDER_SITE_OTHER): Payer: Medicare Other | Admitting: *Deleted

## 2022-11-13 VITALS — BP 120/72 | HR 59 | Ht 63.0 in | Wt 161.6 lb

## 2022-11-13 DIAGNOSIS — Z0000 Encounter for general adult medical examination without abnormal findings: Secondary | ICD-10-CM

## 2022-11-13 NOTE — Patient Instructions (Signed)
Mary Buck , Thank you for taking time to come for your Medicare Wellness Visit. I appreciate your ongoing commitment to your health goals. Please review the following plan we discussed and let me know if I can assist you in the future.     This is a list of the screening recommended for you and due dates:  Health Maintenance  Topic Date Due   COVID-19 Vaccine (7 - 2023-24 season) 08/13/2022   Colon Cancer Screening  02/23/2023   Medicare Annual Wellness Visit  11/13/2023   DTaP/Tdap/Td vaccine (3 - Td or Tdap) 05/08/2030   Pneumonia Vaccine  Completed   Flu Shot  Completed   DEXA scan (bone density measurement)  Completed   Zoster (Shingles) Vaccine  Completed   HPV Vaccine  Aged Out     Next appointment: Follow up in one year for your annual wellness visit.   Preventive Care 51 Years and Older, Female Preventive care refers to lifestyle choices and visits with your health care provider that can promote health and wellness. What does preventive care include? A yearly physical exam. This is also called an annual well check. Dental exams once or twice a year. Routine eye exams. Ask your health care provider how often you should have your eyes checked. Personal lifestyle choices, including: Daily care of your teeth and gums. Regular physical activity. Eating a healthy diet. Avoiding tobacco and drug use. Limiting alcohol use. Practicing safe sex. Taking low-dose aspirin every day. Taking vitamin and mineral supplements as recommended by your health care provider. What happens during an annual well check? The services and screenings done by your health care provider during your annual well check will depend on your age, overall health, lifestyle risk factors, and family history of disease. Counseling  Your health care provider may ask you questions about your: Alcohol use. Tobacco use. Drug use. Emotional well-being. Home and relationship well-being. Sexual  activity. Eating habits. History of falls. Memory and ability to understand (cognition). Work and work Statistician. Reproductive health. Screening  You may have the following tests or measurements: Height, weight, and BMI. Blood pressure. Lipid and cholesterol levels. These may be checked every 5 years, or more frequently if you are over 43 years old. Skin check. Lung cancer screening. You may have this screening every year starting at age 51 if you have a 30-pack-year history of smoking and currently smoke or have quit within the past 15 years. Fecal occult blood test (FOBT) of the stool. You may have this test every year starting at age 3. Flexible sigmoidoscopy or colonoscopy. You may have a sigmoidoscopy every 5 years or a colonoscopy every 10 years starting at age 14. Hepatitis C blood test. Hepatitis B blood test. Sexually transmitted disease (STD) testing. Diabetes screening. This is done by checking your blood sugar (glucose) after you have not eaten for a while (fasting). You may have this done every 1-3 years. Bone density scan. This is done to screen for osteoporosis. You may have this done starting at age 64. Mammogram. This may be done every 1-2 years. Talk to your health care provider about how often you should have regular mammograms. Talk with your health care provider about your test results, treatment options, and if necessary, the need for more tests. Vaccines  Your health care provider may recommend certain vaccines, such as: Influenza vaccine. This is recommended every year. Tetanus, diphtheria, and acellular pertussis (Tdap, Td) vaccine. You may need a Td booster every 10 years. Zoster vaccine.  You may need this after age 27. Pneumococcal 13-valent conjugate (PCV13) vaccine. One dose is recommended after age 25. Pneumococcal polysaccharide (PPSV23) vaccine. One dose is recommended after age 23. Talk to your health care provider about which screenings and vaccines  you need and how often you need them. This information is not intended to replace advice given to you by your health care provider. Make sure you discuss any questions you have with your health care provider. Document Released: 09/28/2015 Document Revised: 05/21/2016 Document Reviewed: 07/03/2015 Elsevier Interactive Patient Education  2017 [employer] Prevention in the Home Falls can cause injuries. They can happen to people of all ages. There are many things you can do to make your home safe and to help prevent falls. What can I do on the outside of my home? Regularly fix the edges of walkways and driveways and fix any cracks. Remove anything that might make you trip as you walk through a door, such as a raised step or threshold. Trim any bushes or trees on the path to your home. Use bright outdoor lighting. Clear any walking paths of anything that might make someone trip, such as rocks or tools. Regularly check to see if handrails are loose or broken. Make sure that both sides of any steps have handrails. Any raised decks and porches should have guardrails on the edges. Have any leaves, snow, or ice cleared regularly. Use sand or salt on walking paths during winter. Clean up any spills in your garage right away. This includes oil or grease spills. What can I do in the bathroom? Use night lights. Install grab bars by the toilet and in the tub and shower. Do not use towel bars as grab bars. Use non-skid mats or decals in the tub or shower. If you need to sit down in the shower, use a plastic, non-slip stool. Keep the floor dry. Clean up any water that spills on the floor as soon as it happens. Remove soap buildup in the tub or shower regularly. Attach bath mats securely with double-sided non-slip rug tape. Do not have throw rugs and other things on the floor that can make you trip. What can I do in the bedroom? Use night lights. Make sure that you have a light by your bed that  is easy to reach. Do not use any sheets or blankets that are too big for your bed. They should not hang down onto the floor. Have a firm chair that has side arms. You can use this for support while you get dressed. Do not have throw rugs and other things on the floor that can make you trip. What can I do in the kitchen? Clean up any spills right away. Avoid walking on wet floors. Keep items that you use a lot in easy-to-reach places. If you need to reach something above you, use a strong step stool that has a grab bar. Keep electrical cords out of the way. Do not use floor polish or wax that makes floors slippery. If you must use wax, use non-skid floor wax. Do not have throw rugs and other things on the floor that can make you trip. What can I do with my stairs? Do not leave any items on the stairs. Make sure that there are handrails on both sides of the stairs and use them. Fix handrails that are broken or loose. Make sure that handrails are as long as the stairways. Check any carpeting to make sure that it is  firmly attached to the stairs. Fix any carpet that is loose or worn. Avoid having throw rugs at the top or bottom of the stairs. If you do have throw rugs, attach them to the floor with carpet tape. Make sure that you have a light switch at the top of the stairs and the bottom of the stairs. If you do not have them, ask someone to add them for you. What else can I do to help prevent falls? Wear shoes that: Do not have high heels. Have rubber bottoms. Are comfortable and fit you well. Are closed at the toe. Do not wear sandals. If you use a stepladder: Make sure that it is fully opened. Do not climb a closed stepladder. Make sure that both sides of the stepladder are locked into place. Ask someone to hold it for you, if possible. Clearly mark and make sure that you can see: Any grab bars or handrails. First and last steps. Where the edge of each step is. Use tools that help you  move around (mobility aids) if they are needed. These include: Canes. Walkers. Scooters. Crutches. Turn on the lights when you go into a dark area. Replace any light bulbs as soon as they burn out. Set up your furniture so you have a clear path. Avoid moving your furniture around. If any of your floors are uneven, fix them. If there are any pets around you, be aware of where they are. Review your medicines with your doctor. Some medicines can make you feel dizzy. This can increase your chance of falling. Ask your doctor what other things that you can do to help prevent falls. This information is not intended to replace advice given to you by your health care provider. Make sure you discuss any questions you have with your health care provider. Document Released: 06/28/2009 Document Revised: 02/07/2016 Document Reviewed: 10/06/2014 Elsevier Interactive Patient Education  2017 [employer].

## 2022-11-13 NOTE — Progress Notes (Signed)
Subjective:   Mary Buck is a 87 y.o. female who presents for Medicare Annual (Subsequent) preventive examination.   Review of Systems     Cardiac Risk Factors include: advanced age (>38mn, >>59women);dyslipidemia     Objective:    Today's Vitals   11/13/22 1315 11/13/22 1333  BP: (!) 147/79 120/72  Pulse: 60 (!) 59  Weight: 161 lb 9.6 oz (73.3 kg)   Height: '5\' 3"'$  (1.6 m)   PainSc: 2     Body mass index is 28.63 kg/m.     11/13/2022    1:14 PM 10/29/2022    1:33 PM 10/28/2021    1:47 PM 07/30/2021    2:34 PM 04/29/2021   11:38 AM 10/25/2020   11:55 AM 05/30/2020    3:27 PM  Advanced Directives  Does Patient Have a Medical Advance Directive? Yes Yes Yes Yes Yes Yes Yes  Type of AParamedicof ABrinkleyLiving will HL[place]Living will Living will;Healthcare Power of [place]unlapLiving will [place]leyLiving will [place]m CityLiving will HPine CityLiving will  Does patient want to make changes to medical advance directive? No - Patient declined  No - Patient declined  No - Patient declined  No - Patient declined  Copy of [place] Southin Chart? No - copy requested No - copy requested No - copy requested  No - copy requested No - copy requested No - copy requested    Current Medications (verified) Outpatient Encounter Medications as of 11/13/2022  Medication Sig   acetaminophen (TYLENOL) 325 MG tablet Take 650 mg by mouth at bedtime. As needed   Cholecalciferol (VITAMIN D-3 PO) Take 1,000 Units by mouth daily.   famotidine (PEPCID) 20 MG tablet TAKE 1 TABLET(20 MG) BY MOUTH AT BEDTIME   gabapentin (NEURONTIN) 300 MG capsule Take 1 capsule (300 mg total) by mouth 3 (three) times daily.   lansoprazole (PREVACID) 30 MG capsule TAKE 1 CAPSULE(30 MG) BY MOUTH TWICE DAILY BEFORE A MEAL   levothyroxine (SYNTHROID) 88 MCG tablet TAKE 1  TABLET(88 MCG) BY MOUTH DAILY BEFORE BREAKFAST   misoprostol (CYTOTEC) 100 MCG tablet TAKE 2 TABLETS BY MOUTH AFTER BREAKFAST AND AFTER DINNER AS DIRECTED   nystatin (MYCOSTATIN/NYSTOP) powder Apply 1 Application topically 3 (three) times daily.   simvastatin (ZOCOR) 20 MG tablet TAKE 1 TABLET(20 MG) BY MOUTH DAILY   traMADol (ULTRAM) 50 MG tablet Take 50 mg by mouth 3 (three) times daily.   No facility-administered encounter medications on file as of 11/13/2022.    Allergies (verified) Contrast media [iodinated contrast media], Oxycodone, Latex, Mirtazapine, Acitretin, Cephalexin, Elemental sulfur, Penicillins, and Sulfa antibiotics   History: Past Medical History:  Diagnosis Date   Allergy    Asthma    Cancer ([place]    Cataract    DDD (degenerative disc disease), lumbar 06/01/2017   Depression    GERD (gastroesophageal reflux disease)    Heart murmur    Hyperlipidemia    IDA (iron deficiency anemia) 05/31/2020   Peripheral neuropathy 04/30/2017   Thyroid disease    Past Surgical History:  Procedure Laterality Date   bladder cancer  2013   CARPAL TUNNEL RELEASE  2015   CATARACT EXTRACTION Bilateral 2012   CHOLECYSTECTOMY     COLONOSCOPY WITH ESOPHAGOGASTRODUODENOSCOPY (EGD)  [facility]   [facility]   SPINE SURGERY  6/31/21  vertaflex   TONSILLECTOMY AND ADENOIDECTOMY  1943   Family History  Problem Relation Age of Onset   Macular degeneration Mother    Hyperlipidemia Father    Stroke Father    Cancer Brother        ? stomach   Prostate cancer Other        in brothers   Colon cancer Neg Hx    Social History   Socioeconomic History   Marital status: Significant Other    Spouse name: Not on file   Number of children: 0   Years of education: 16   Highest education level: Not on file  Occupational History   Occupation: retired  Tobacco Use   Smoking status: Former    Types: Cigarettes     Quit date: 2015    Years since quitting: 9.1   Smokeless tobacco: Never  Vaping Use   Vaping Use: Never used  Substance and Sexual Activity   Alcohol use: No   Drug use: No   Sexual activity: Not on file  Other Topics Concern   Not on file  Social History Narrative   Not on file   Social Determinants of Health   Financial Resource Strain: Low Risk  (07/30/2021)   Overall Financial Resource Strain (CARDIA)    Difficulty of Paying Living Expenses: Not hard at all  Food Insecurity: No Food Insecurity (11/13/2022)   Hunger Vital Sign    Worried About Running Out of Food in the Last Year: Never true    [place] in the Last Year: Never true  Transportation Needs: No Transportation Needs (11/13/2022)   PRAPARE - Hydrologist (Medical): No    Lack of Transportation (Non-Medical): No  Physical Activity: Insufficiently Active (07/30/2021)   Exercise Vital Sign    Days of Exercise per Week: 7 days    Minutes of Exercise per Session: 20 min  Stress: Stress Concern Present (07/30/2021)   [place]    Feeling of Stress : To some extent  Social Connections: Moderately Isolated (07/30/2021)   Social Connection and Isolation Panel [NHANES]    Frequency of Communication with Friends and Family: Once a week    Frequency of Social Gatherings with Friends and Family: Once a week    Attends Religious Services: More than 4 times per year    Active Member of [employer] or Organizations: No    Attends Music therapist: Never    Marital Status: Living with partner    Tobacco Counseling Counseling given: Not Answered   Clinical Intake:  Pre-visit preparation completed: Yes  Pain : 0-10 Pain Score: 2  Pain Location: Leg Pain Orientation: Left Pain Onset: More than a month ago Pain Frequency: Intermittent     Diabetes: No  How often do you need to have someone help you when  you read instructions, pamphlets, or other written materials from your doctor or pharmacy?: 1 - Never  Activities of Daily Living    11/13/2022    1:21 PM  In your present state of health, do you have any difficulty performing the following activities:  Hearing? 1  Comment slight hearing loss  Vision? 0  Difficulty concentrating or making decisions? 0  Walking or climbing stairs? 0  Dressing or bathing? 0  Doing errands, shopping? 0  Preparing Food and eating ? N  Using the Toilet? N  In the past six months, have you accidently  leaked urine? N  Do you have problems with loss of bowel control? N  Managing your Medications? N  Managing your Finances? N  Housekeeping or managing your Housekeeping? Y    Patient Care Team: Copland, Gay Filler, MD as PCP - General (Family Medicine) Berniece Salines, DO as PCP - Cardiology (Cardiology)  Indicate any recent Medical Services you may have received from other than [facility] providers in the past year (date may be approximate).     Assessment:   This is a routine wellness examination for Blessyn.  Hearing/Vision screen No results found.  Dietary issues and exercise activities discussed: Current Exercise Habits: Home exercise routine, Type of exercise: walking;Other - see comments (chair yoga), Time (Minutes): 30, Frequency (Times/Week): 5, Weekly Exercise (Minutes/Week): 150, Intensity: Mild, Exercise limited by: orthopedic condition(s)   Goals Addressed   None    Depression Screen    11/13/2022    1:19 PM 11/10/2022    2:10 PM 08/14/2022   11:43 AM 05/22/2022   11:38 AM 01/27/2022    2:29 PM 01/21/2022    2:54 PM 07/30/2021    2:39 PM  PHQ 2/9 Scores  PHQ - 2 Score 0 0 0 6 6 0 1  PHQ- 9 Score  0  6       Fall Risk    11/13/2022    1:18 PM 11/10/2022    2:10 PM 08/14/2022   11:42 AM 05/22/2022   11:37 AM 01/27/2022    2:29 PM  Fall Risk   Falls in the past year? 0 0 0 0 0  Number falls in past yr: 0 0 0 0 0  Injury with Fall? 0 0 0 0    Risk for fall due to : No Fall Risks History of fall(s) No Fall Risks    Follow up Falls evaluation completed Falls evaluation completed Falls evaluation completed Falls evaluation completed     [place]:  Any stairs in or around the home? No  Home free of loose throw rugs in walkways, pet beds, electrical cords, etc? Yes  Adequate lighting in your home to reduce risk of falls? Yes   ASSISTIVE DEVICES UTILIZED TO PREVENT FALLS:  Life alert? No  Use of a cane, walker or w/c? Yes  Grab bars in the bathroom? Yes  Shower chair or bench in shower? No  Elevated toilet seat or a handicapped toilet? No   TIMED UP AND GO:  Was the test performed? Yes .  Length of time to ambulate 10 feet: 9 sec.   Gait slow and steady with assistive device  Cognitive Function:    11/13/2022    2:06 PM 07/26/2020   10:32 AM  MMSE - Mini Mental State Exam  Not completed: Unable to complete   Orientation to time  5  Orientation to Place  5  Registration  3  Attention/ Calculation  5  Recall  3  Language- name 2 objects  2  Language- repeat  1  Language- follow 3 step command  3  Language- read & follow direction  1  Write a sentence  1  Copy design  1  Total score  30        03/28/2020    2:20 PM  6CIT Screen  What Year? 0 points  What month? 0 points  What time? 0 points  Count back from 20 0 points  Months in reverse 0 points  Repeat phrase 0 points  Total  Score 0 points    Immunizations Immunization History  Administered Date(s) Administered   Fluad Quad(high Dose 65+) 06/13/2020, 06/27/2021   Influenza Split 06/15/2009, 05/29/2011, 06/14/2013   Influenza, High Dose Seasonal PF 05/21/2010, 06/30/2014, 06/14/2017   Influenza, Seasonal, Injecte, Preservative Fre 05/21/2010   Influenza,inj,Quad PF,6+ Mos 06/18/2015   Influenza,inj,quad, With Preservative 06/30/2014   Influenza-Unspecified 06/15/2009, 05/29/2011, 10/21/2012, 06/14/2013,  06/18/2015, 06/13/2020, 07/11/2022   PFIZER Comirnaty(Gray Top)Covid-19 Tri-Sucrose Vaccine 03/29/2021   PFIZER(Purple Top)SARS-COV-2 Vaccination 10/20/2019, 11/10/2019, 06/22/2020   Pfizer Covid-19 Vaccine Bivalent Booster 23yr & up 06/27/2021   Pneumococcal Conjugate-13 08/27/2004, 10/05/2014   Pneumococcal Polysaccharide-23 08/27/2004   Pneumococcal-Unspecified 08/27/2004   Td 05/08/2020   Tdap 12/13/2009   Unspecified SARS-COV-2 Vaccination 06/18/2022   Zoster Recombinat (Shingrix) 01/01/2022, 04/09/2022   Zoster, Live 09/21/2013    TDAP status: Up to date  Flu Vaccine status: Up to date  Pneumococcal vaccine status: Up to date  Covid-19 vaccine status: Information provided on how to obtain vaccines.   Qualifies for Shingles Vaccine? Yes   Zostavax completed Yes   Shingrix Completed?: Yes  Screening Tests Health Maintenance  Topic Date Due   Medicare Annual Wellness (AWV)  07/30/2022   COVID-19 Vaccine (7 - 2023-24 season) 08/13/2022   COLONOSCOPY (Pts 45-458yrInsurance coverage will need to be confirmed)  02/23/2023   DTaP/Tdap/Td (3 - Td or Tdap) 05/08/2030   Pneumonia Vaccine 87Years old  Completed   INFLUENZA VACCINE  Completed   DEXA SCAN  Completed   Zoster Vaccines- Shingrix  Completed   HPV VACCINES  Aged Out    Health Maintenance  Health Maintenance Due  Topic Date Due   Medicare Annual Wellness (AWV)  07/30/2022   COVID-19 Vaccine (7 - 2023-24 season) 08/13/2022    Colorectal cancer screening: No longer required.   Mammogram status: Completed 07/08/22. Repeat every year  Bone Density status: pt declined  Lung Cancer Screening: (Low Dose CT Chest recommended if Age 85-80ears, 30 pack-year currently smoking OR have quit w/in 15years.) does not qualify.    Additional Screening:  Hepatitis C Screening: does not qualify  Vision Screening: Recommended annual ophthalmology exams for early detection of glaucoma and other disorders of the  eye. Is the patient up to date with their annual eye exam?  Yes  Who is the provider or what is the name of the office in which the patient attends annual eye exams? Dr. KaJuliann Pulsesamis If pt is not established with a provider, would they like to be referred to a provider to establish care? No .   Dental Screening: Recommended annual dental exams for proper oral hygiene  Community Resource Referral / Chronic Care Management: CRR required this visit?  No   CCM required this visit?  No      Plan:     I have personally reviewed and noted the following in the patient's chart:   Medical and social history Use of alcohol, tobacco or illicit drugs  Current medications and supplements including opioid prescriptions. Patient is currently taking opioid prescriptions. Information provided to patient regarding non-opioid alternatives. Patient advised to discuss non-opioid treatment plan with their provider. Functional ability and status Nutritional status Physical activity Advanced directives List of other physicians Hospitalizations, surgeries, and ER visits in previous 12 months Vitals Screenings to include cognitive, depression, and falls Referrals and appointments  In addition, I have reviewed and discussed with patient certain preventive protocols, quality metrics, and best practice recommendations. A written personalized care plan for preventive  services as well as general preventive health recommendations were provided to patient.    Beatris Ship, [place]   11/13/2022   Nurse Notes: None

## 2022-11-17 DIAGNOSIS — M48061 Spinal stenosis, lumbar region without neurogenic claudication: Secondary | ICD-10-CM | POA: Diagnosis not present

## 2022-11-17 DIAGNOSIS — M25562 Pain in left knee: Secondary | ICD-10-CM | POA: Diagnosis not present

## 2022-11-17 DIAGNOSIS — M1612 Unilateral primary osteoarthritis, left hip: Secondary | ICD-10-CM | POA: Diagnosis not present

## 2022-11-25 DIAGNOSIS — M48062 Spinal stenosis, lumbar region with neurogenic claudication: Secondary | ICD-10-CM | POA: Diagnosis not present

## 2022-11-25 DIAGNOSIS — M47816 Spondylosis without myelopathy or radiculopathy, lumbar region: Secondary | ICD-10-CM | POA: Diagnosis not present

## 2022-11-25 DIAGNOSIS — Z79899 Other long term (current) drug therapy: Secondary | ICD-10-CM | POA: Diagnosis not present

## 2022-12-02 ENCOUNTER — Encounter: Payer: Medicare Other | Attending: Psychology | Admitting: Psychology

## 2022-12-02 DIAGNOSIS — F411 Generalized anxiety disorder: Secondary | ICD-10-CM | POA: Diagnosis not present

## 2022-12-02 DIAGNOSIS — G894 Chronic pain syndrome: Secondary | ICD-10-CM | POA: Insufficient documentation

## 2022-12-02 DIAGNOSIS — F331 Major depressive disorder, recurrent, moderate: Secondary | ICD-10-CM | POA: Insufficient documentation

## 2022-12-04 ENCOUNTER — Ambulatory Visit (INDEPENDENT_AMBULATORY_CARE_PROVIDER_SITE_OTHER): Payer: Medicare Other

## 2022-12-04 ENCOUNTER — Ambulatory Visit (INDEPENDENT_AMBULATORY_CARE_PROVIDER_SITE_OTHER): Payer: Medicare Other | Admitting: Podiatry

## 2022-12-04 DIAGNOSIS — R252 Cramp and spasm: Secondary | ICD-10-CM | POA: Diagnosis not present

## 2022-12-04 DIAGNOSIS — R52 Pain, unspecified: Secondary | ICD-10-CM

## 2022-12-04 DIAGNOSIS — M79674 Pain in right toe(s): Secondary | ICD-10-CM | POA: Diagnosis not present

## 2022-12-04 DIAGNOSIS — M79675 Pain in left toe(s): Secondary | ICD-10-CM | POA: Diagnosis not present

## 2022-12-04 DIAGNOSIS — M722 Plantar fascial fibromatosis: Secondary | ICD-10-CM

## 2022-12-04 DIAGNOSIS — B351 Tinea unguium: Secondary | ICD-10-CM

## 2022-12-04 NOTE — Patient Instructions (Signed)

## 2022-12-04 NOTE — Progress Notes (Signed)
Subjective:   Patient ID: Mary Buck, female   DOB: 87 y.o.   MRN: [id number]   HPI Chief Complaint  Patient presents with   Foot Pain    Routine foot care , pain located in bilateral feet, patient unsure where in the feet the pain occurs, right over left     87 year old female presents the office today with above concerns.  She gets a lot of cramping in the right foot/leg than the left. If she is sleeping and she wakes up and starts to stretch it it cramps, even without stretching. Feels better once she gets up and starts waliing. It takes a while to get the foot flat on the group. No treatments. No injuries that she recalls.   She later asked for a nail trim as her nails are thick and long.  She has difficulty trimming herself.  No swelling redness or drainage.  Review of Systems  All other systems reviewed and are negative.  Past Medical History:  Diagnosis Date   Allergy    Asthma    Cancer ([place])    Cataract    DDD (degenerative disc disease), lumbar 06/01/2017   Depression    GERD (gastroesophageal reflux disease)    Heart murmur    Hyperlipidemia    IDA (iron deficiency anemia) 05/31/2020   Peripheral neuropathy 04/30/2017   Thyroid disease     Past Surgical History:  Procedure Laterality Date   bladder cancer  2013   CARPAL TUNNEL RELEASE  2015   CATARACT EXTRACTION Bilateral 2012   CHOLECYSTECTOMY     COLONOSCOPY WITH ESOPHAGOGASTRODUODENOSCOPY (EGD)  02/22/2018   [facility] Left 2015   benign   SPINE SURGERY  6/31/21   vertaflex   TONSILLECTOMY AND ADENOIDECTOMY  1943     Current Outpatient Medications:    acetaminophen (TYLENOL) 325 MG tablet, Take 650 mg by mouth at bedtime. As needed, Disp: , Rfl:    Cholecalciferol (VITAMIN D-3 PO), Take 1,000 Units by mouth daily., Disp: , Rfl:    famotidine (PEPCID) 20 MG tablet, TAKE 1 TABLET(20 MG) BY MOUTH AT BEDTIME, Disp: 90 tablet, Rfl: 3   gabapentin  (NEURONTIN) 300 MG capsule, Take 1 capsule (300 mg total) by mouth 3 (three) times daily., Disp: 90 capsule, Rfl: 4   lansoprazole (PREVACID) 30 MG capsule, TAKE 1 CAPSULE(30 MG) BY MOUTH TWICE DAILY BEFORE A MEAL, Disp: 180 capsule, Rfl: 1   levothyroxine (SYNTHROID) 88 MCG tablet, TAKE 1 TABLET(88 MCG) BY MOUTH DAILY BEFORE BREAKFAST, Disp: 90 tablet, Rfl: 1   misoprostol (CYTOTEC) 100 MCG tablet, TAKE 2 TABLETS BY MOUTH AFTER BREAKFAST AND AFTER DINNER AS DIRECTED, Disp: 120 tablet, Rfl: 5   nystatin (MYCOSTATIN/NYSTOP) powder, Apply 1 Application topically 3 (three) times daily., Disp: 30 g, Rfl: 1   simvastatin (ZOCOR) 20 MG tablet, TAKE 1 TABLET(20 MG) BY MOUTH DAILY, Disp: 90 tablet, Rfl: 3   traMADol (ULTRAM) 50 MG tablet, Take 50 mg by mouth 3 (three) times daily., Disp: , Rfl:   Allergies  Allergen Reactions   Contrast Media [Iodinated Contrast Media] Anaphylaxis   Oxycodone Anaphylaxis    "my throat swells with any opioid"   Latex Other (See Comments)    Other reaction(s): blistering, redness    Mirtazapine Nausea Only   Acitretin Rash   Cephalexin Other (See Comments)    Unknown   Elemental Sulfur Other (See Comments)    unknown   Penicillins Other (  See Comments)    Unknown   Sulfa Antibiotics Other (See Comments)    Unknown          Objective:  Physical Exam  General: AAO x3, NAD  Dermatological: Nails are hypertrophic, dystrophic, brittle, discolored, elongated 10. No surrounding redness or drainage. Tenderness nails 1-5 bilaterally. No open lesions or pre-ulcerative lesions are identified today.  Vascular: Dorsalis Pedis artery and Posterior Tibial artery pedal pulses are palpable bilateral with immedate capillary fill time.  There is no pain with calf compression, swelling, warmth, erythema.   Neruologic: Grossly intact via light touch bilateral.  Musculoskeletal: There is no area pinpoint tenderness noted today.  Equinus is present.  She does have some  discomfort the arch of the foot on medial band plantar fascia, musculature.  There is no pain with calf compression erythema or warmth.  Calf is supple.  Bunion present.  Gait: Unassisted, Nonantalgic.       Assessment:   Leg cramps, arch pain; symptomatic onychomycosis     Plan:  -Treatment options discussed including all alternatives, risks, and complications -Etiology of symptoms were discussed -X-rays were obtained and reviewed with the patient.  3 views of the feet were obtained.  No evidence of acute fracture noted today.  Bunion present.  Osteopenia is also noted.  Calcaneal spurring present. -I think her pain is more muscular in nature.  She has palpable pulses and she also had an ABI performed in December of last year which was within normal limits. -We discussed stretching, icing on regular basis.  She does have a physical therapist that she is to contact them to get started with this.  We discussed restrictions with her daily.  States she is not good arch support.  Discussed wound.  Furthermore under the arch of her foot as well. -Sharply debrided nails x 10 without any complications or bleeding.     Trula Slade DPM

## 2022-12-05 ENCOUNTER — Telehealth: Payer: Self-pay | Admitting: Family Medicine

## 2022-12-05 NOTE — Telephone Encounter (Signed)
Will table until JC returns.

## 2022-12-05 NOTE — Telephone Encounter (Signed)
Pt is requesting a referral for physical therapy to be sent to the rehab center. Please advise.

## 2022-12-09 ENCOUNTER — Encounter: Payer: Self-pay | Admitting: Family Medicine

## 2022-12-09 DIAGNOSIS — G8929 Other chronic pain: Secondary | ICD-10-CM

## 2022-12-09 NOTE — Telephone Encounter (Signed)
Okay for referral?

## 2022-12-11 ENCOUNTER — Encounter: Payer: Self-pay | Admitting: Psychology

## 2022-12-11 NOTE — Progress Notes (Signed)
Neuropsychology Visit  Patient:  Mary Buck   DOB: 1936-07-17  MR Number: [id number]  Location: [facility] [place], [place] [id number] [place] [address] Dept: [phone]  Date of Service: 12/02/2022  Start: 1 PM End: 2 PM  Today's visit was an in person visit was conducted in my outpatient clinic office.  The patient myself were present.  Duration of Service: 1 Hour  Provider/Observer:     Edgardo Roys PsyD  Chief Complaint:      Chief Complaint  Patient presents with   Pain   Back Pain   Gait Problem   Depression    Reason For Service:     Mary Buck is an 87 year old female who has a history of anxiety and depressive symptomatology going back to adolescence.  Patient has had issues with insomnia since age 5 that were started around the time when she had an encounter with a snake and stayed up all night worrying that she had been bitten with a venomous bite despite absence of physical signs or symptoms.  OCD and anxiety have persisted throughout life.  Patient had a recent neuropsychological evaluation due to concerns of cognitive changes but did quite well on almost all neuropsychological measures and memory issues are likely related to anxiety and normal age-related changes.  The patient has been followed by Dr. Darol Destine for psychotherapeutic interventions and after he left the practice she is following up with myself.  Treatment Interventions:  Cognitive/behavioral psychotherapeutic interventions or any issues related to generalized anxiety and coping and stress.  Participation Level:   Active  Participation Quality:  Appropriate      Behavioral Observation:  Well Groomed, Alert, and Appropriate.   Current Psychosocial Factors: The patient reports that she has been feeling more anhedonic lately and has lost motivation to do things outside of her  home for the most part.  The patient reports that pain is playing a major role in these issues.  The patient reports that she has not been motivated to reach out to friends like she would have traditionally.  Significant pain and physical limitations continue.  Content of Session:   Reviewed current symptoms and worked on therapeutic interventions around issues of anxiety and depressive symptomatology and coping with various stressors in her life.  Effectiveness of Interventions: Report has begun to be established and the patient has been open and engaging throughout the therapeutic efforts and we are working on transitioning from previous psychologist.  Target Goals:   We continue to work issues related to coping skills around her anxiety and adjustment to significant changes in physical functioning.  Patient continues with significant back pain and has an appointment coming up with her orthopedist.  Patient has been diagnosed with obstructive sleep apnea but has not been able to tolerate a CPAP machine when she was initially in the fitting.  And will also be important to try to get the patient to give the CPAP device another effort as it may have a beneficial effect for her anxiety, memory and depressive symptomatology.  Goals Last Reviewed:   12/02/2022  Goals Addressed Today:    Today we continue to work on coping skills and strategies around anxiety and depressive types of symptoms and dealing with significant chronic pain issues and coping with aging.  Impression/Diagnosis:   Mary Buck is an 87 year old female who has a history of anxiety and depressive symptomatology going  back to adolescence.  Patient has had issues with insomnia since age 79 that were started around the time when she had an encounter with a snake and stayed up all night worrying that she had been bitten with a venomous bite despite absence of physical signs or symptoms.  OCD and anxiety have persisted throughout life.   Patient had a recent neuropsychological evaluation due to concerns of cognitive changes but did quite well on almost all neuropsychological measures and memory issues are likely related to anxiety and normal age-related changes.  The patient has been followed by Dr. Darol Destine for psychotherapeutic interventions and after he left the practice she is following up with myself.  The patient has an upcoming appointment with Dr. Posey Pronto who is an anesthesiologist about interventions that may be possible due to her chronic radicular lumbar pain symptoms.  Diagnosis:   Moderate episode of recurrent major depressive disorder (HCC)  Generalized anxiety disorder  Chronic pain syndrome    [name], Psy.D. Clinical Psychologist Neuropsychologist

## 2022-12-12 ENCOUNTER — Telehealth: Payer: Self-pay | Admitting: Physician Assistant

## 2022-12-12 NOTE — Telephone Encounter (Signed)
Called requesting refills on misoprostol 100 mg taking 2 p.o. twice daily Refill sent to [facility] for 120 and 3 refills  She needs a follow-up office visit with Dr. Bryan Lemma, forwarding this note so she can be called for a follow-up visit

## 2022-12-17 DIAGNOSIS — M1612 Unilateral primary osteoarthritis, left hip: Secondary | ICD-10-CM | POA: Diagnosis not present

## 2022-12-23 ENCOUNTER — Encounter: Payer: Self-pay | Admitting: Family Medicine

## 2022-12-23 DIAGNOSIS — Z01818 Encounter for other preprocedural examination: Secondary | ICD-10-CM

## 2022-12-29 ENCOUNTER — Other Ambulatory Visit (HOSPITAL_COMMUNITY): Payer: Self-pay

## 2022-12-29 DIAGNOSIS — H18513 Endothelial corneal dystrophy, bilateral: Secondary | ICD-10-CM | POA: Diagnosis not present

## 2022-12-29 DIAGNOSIS — H04123 Dry eye syndrome of bilateral lacrimal glands: Secondary | ICD-10-CM | POA: Diagnosis not present

## 2022-12-29 DIAGNOSIS — H02201 Unspecified lagophthalmos right upper eyelid: Secondary | ICD-10-CM | POA: Diagnosis not present

## 2022-12-29 DIAGNOSIS — H02204 Unspecified lagophthalmos left upper eyelid: Secondary | ICD-10-CM | POA: Diagnosis not present

## 2022-12-30 ENCOUNTER — Other Ambulatory Visit: Payer: Self-pay | Admitting: Family Medicine

## 2023-01-01 NOTE — Progress Notes (Signed)
Sent message, via epic in basket, requesting orders in epic from surgeon.  

## 2023-01-05 ENCOUNTER — Encounter: Payer: Medicare Other | Attending: Psychology | Admitting: Psychology

## 2023-01-05 DIAGNOSIS — G4733 Obstructive sleep apnea (adult) (pediatric): Secondary | ICD-10-CM | POA: Insufficient documentation

## 2023-01-05 DIAGNOSIS — G894 Chronic pain syndrome: Secondary | ICD-10-CM | POA: Diagnosis not present

## 2023-01-06 ENCOUNTER — Encounter (HOSPITAL_COMMUNITY): Payer: Self-pay

## 2023-01-06 ENCOUNTER — Encounter: Payer: Self-pay | Admitting: Psychology

## 2023-01-06 NOTE — Progress Notes (Signed)
Neuropsychology Visit  Patient:  Mary Buck   DOB: 1935/11/19  MR Number: [id number]  Location: [facility]YSICAL MEDICINE & REHABILITATION [address] [place], [id number] [facility] [place] [address] Dept: [phone]  Date of Service: 01/05/2023  Start: 2 PM End: 3 PM  Today's visit was an in person visit was conducted in my outpatient clinic office.  The patient myself were present.  Duration of Service: 1 Hour  Provider/Observer:     Hershal Coria PsyD  Chief Complaint:      Chief Complaint  Patient presents with   Depression   Pain   Back Pain    Reason For Service:     Mary Buck is an 87 year old female who has a history of anxiety and depressive symptomatology going back to adolescence.  Patient has had issues with insomnia since age 5 that were started around the time when she had an encounter with a snake and stayed up all night worrying that she had been bitten with a venomous bite despite absence of physical signs or symptoms.  OCD and anxiety have persisted throughout life.  Patient had a recent neuropsychological evaluation due to concerns of cognitive changes but did quite well on almost all neuropsychological measures and memory issues are likely related to anxiety and normal age-related changes.  The patient has been followed by Dr. Vella Kohler for psychotherapeutic interventions and after he left the practice she is following up with myself.  Treatment Interventions:  Cognitive/behavioral psychotherapeutic interventions or any issues related to generalized anxiety and coping and stress.  Participation Level:   Active  Participation Quality:  Appropriate      Behavioral Observation:  Well Groomed, Alert, and Appropriate.   Current Psychosocial Factors: Patient has done better as far as her motivation and getting things done but continues with considerable pain and limitations  physically.  The patient has planned hip replacement surgery which has caused some stress and worry about her recovery expectations.  Content of Session:   Reviewed current symptoms and worked on therapeutic interventions around issues of anxiety and depressive symptomatology and coping with various stressors in her life.  Effectiveness of Interventions: Report has begun to be established and the patient has been open and engaging throughout the therapeutic efforts and we are working on transitioning from previous psychologist.  Target Goals:   We continue to work issues related to coping skills around her anxiety and adjustment to significant changes in physical functioning.  Patient continues with significant back pain and has an appointment coming up with her orthopedist.  Patient has been diagnosed with obstructive sleep apnea but has not been able to tolerate a CPAP machine when she was initially in the fitting.  And will also be important to try to get the patient to give the CPAP device another effort as it may have a beneficial effect for her anxiety, memory and depressive symptomatology.  Goals Last Reviewed:   01/05/2023  Goals Addressed Today:    Today we continue to work on coping skills and strategies around anxiety and depressive types of symptoms and dealing with significant chronic pain issues and coping with aging.  Impression/Diagnosis:   Mary Buck is an 87 year old female who has a history of anxiety and depressive symptomatology going back to adolescence.  Patient has had issues with insomnia since age 66 that were started around the time when she had an encounter with a snake and stayed up all  night worrying that she had been bitten with a venomous bite despite absence of physical signs or symptoms.  OCD and anxiety have persisted throughout life.  Patient had a recent neuropsychological evaluation due to concerns of cognitive changes but did quite well on almost all  neuropsychological measures and memory issues are likely related to anxiety and normal age-related changes.  The patient has been followed by Dr. Vella Kohler for psychotherapeutic interventions and after he left the practice she is following up with myself.  The patient has an upcoming appointment with Dr. Allena Katz who is an anesthesiologist about interventions that may be possible due to her chronic radicular lumbar pain symptoms.  Diagnosis:   Chronic pain syndrome  OSA (obstructive sleep apnea)    Arley Phenix, Psy.D. Clinical Psychologist Neuropsychologist

## 2023-01-06 NOTE — Progress Notes (Addendum)
PCP - Warner Mccreedy, MD [facility] 11-10-22 epic . 12-23-22 ? Cardiology consult written in note Cardiologist - Thomasene Ripple, DO [facility] 09-03-20 epic  PPM/ICD -  Device Orders -  Rep Notified -   Chest x-ray -  EKG -  Stress Test -  ECHO - 2021 epic Cardiac Cath -   Sleep Study -  CPAP -   Fasting Blood Sugar -  Checks Blood Sugar _____ times a day  Blood Thinner Instructions: Aspirin Instructions:  ERAS Protcol - PRE-SURGERY Ensure or G2-    COVID vaccine -  Activity-- Anesthesia review:   Patient denies shortness of breath, fever, cough and chest pain at PAT appointment   All instructions explained to the patient, with a verbal understanding of the material. Patient agrees to go over the instructions while at home for a better understanding. Patient also instructed to self quarantine after being tested for COVID-19. The opportunity to ask questions was provided.

## 2023-01-06 NOTE — Patient Instructions (Addendum)
SURGICAL WAITING ROOM VISITATION  Patients having surgery or a procedure may have no more than 2 support people in the waiting area - these visitors may rotate.    Children under the age of 36 must have an adult with them who is not the patient.  Due to an increase in RSV and influenza rates and associated hospitalizations, children ages 79 and under may not visit patients in [facility] hospitals.  If the patient needs to stay at the hospital during part of their recovery, the visitor guidelines for inpatient rooms apply. Pre-op nurse will coordinate an appropriate time for 1 support person to accompany patient in pre-op.  This support person may not rotate.    Please refer to the [facility] website for the visitor guidelines for Inpatients (after your surgery is over and you are in a regular room).       Your procedure is scheduled on: 01-20-23   Report to [facility] Main Entrance    Report to admitting at     1030 AM   Call this number if you have problems the morning of surgery [phone]   Do not eat food :After Midnight.   After Midnight you may have the following liquids until _10:00_____ AM  DAY OF SURGERY   Then nothing by mouth  Water Non-Citrus Juices (without pulp, NO RED-Apple, White grape, White cranberry) Black Coffee (NO MILK/CREAM OR CREAMERS, sugar ok)  Clear Tea (NO MILK/CREAM OR CREAMERS, sugar ok) regular and decaf                             Plain Jell-O (NO RED)                                           Fruit ices (not with fruit pulp, NO RED)                                     Popsicles (NO RED)                                                               Sports drinks like Gatorade (NO RED)                    The day of surgery:  Drink ONE (1) Pre-Surgery Clear Ensure  at     0950   AM the morning of surgery. Drink in one sitting. Do not sip.  This drink was given to you during your hospital  pre-op appointment visit. Nothing else to  drink after completing the  Pre-Surgery Clear Ensure .By 10:00 am           If you have questions, please contact your surgeon's office.   FOLLOW ANY ADDITIONAL PRE OP INSTRUCTIONS YOU RECEIVED FROM YOUR SURGEON'S OFFICE!!!     Oral Hygiene is also important to reduce your risk of infection.  Remember - BRUSH YOUR TEETH THE MORNING OF SURGERY WITH YOUR REGULAR TOOTHPASTE  DENTURES WILL BE REMOVED PRIOR TO SURGERY PLEASE DO NOT APPLY "Poly grip" OR ADHESIVES!!!   Do NOT smoke after Midnight   Take these medicines the morning of surgery with A SIP OF WATER: gabapentin, levothyroxine, simvastatin, tramadol, lansoprazole (prevacid)    Bring CPAP mask and tubing day of surgery.                              You may not have any metal on your body including hair pins, jewelry, and body piercing             Do not wear make-up, lotions, powders, perfumes/cologne, or deodorant  Do not wear nail polish including gel and S&S, artificial/acrylic nails, or any other type of covering on natural nails including finger and toenails. If you have artificial nails, gel coating, etc. that needs to be removed by a nail salon please have this removed prior to surgery or surgery may need to be canceled/ delayed if the surgeon/ anesthesia feels like they are unable to be safely monitored.   Do not shave  5 days prior to surgery.               Do not bring valuables to the hospital. [facility] IS NOT             RESPONSIBLE   FOR VALUABLES.   Contacts, glasses, dentures or bridgework may not be worn into surgery.   Bring small overnight bag day of surgery.   DO NOT BRING YOUR HOME MEDICATIONS TO THE HOSPITAL. PHARMACY WILL DISPENSE MEDICATIONS LISTED ON YOUR MEDICATION LIST TO YOU DURING YOUR ADMISSION IN THE HOSPITAL!    Patients discharged on the day of surgery will not be allowed to drive home.  Someone NEEDS to stay with you for the first 24 hours after  anesthesia.   Special Instructions: Bring a copy of your healthcare power of attorney and living will documents the day of surgery if you haven't scanned them before.              Please read over the following fact sheets you were given: IF YOU HAVE QUESTIONS ABOUT YOUR PRE-OP INSTRUCTIONS PLEASE CALL [phone]   I If you test positive for Covid or have been in contact with anyone that has tested positive in the last 10 days please notify you surgeon.     SEE ATTACHED SHOWER INSTRUCTIONS  Incentive Spirometer  An incentive spirometer is a tool that can help keep your lungs clear and active. This tool measures how well you are filling your lungs with each breath. Taking long deep breaths may help reverse or decrease the chance of developing breathing (pulmonary) problems (especially infection) following: A long period of time when you are unable to move or be active. BEFORE THE PROCEDURE  If the spirometer includes an indicator to show your best effort, your nurse or respiratory therapist will set it to a desired goal. If possible, sit up straight or lean slightly forward. Try not to slouch. Hold the incentive spirometer in an upright position. INSTRUCTIONS FOR USE  Sit on the edge of your bed if possible, or sit up as far as you can in bed or on a chair. Hold the incentive spirometer in an upright position. Breathe out normally. Place the mouthpiece in your mouth and seal your lips tightly around it.  Breathe in slowly and as deeply as possible, raising the piston or the ball toward the top of the column. Hold your breath for 3-5 seconds or for as long as possible. Allow the piston or ball to fall to the bottom of the column. Remove the mouthpiece from your mouth and breathe out normally. Rest for a few seconds and repeat Steps 1 through 7 at least 10 times every 1-2 hours when you are awake. Take your time and take a few normal breaths between deep breaths. The spirometer may include  an indicator to show your best effort. Use the indicator as a goal to work toward during each repetition. After each set of 10 deep breaths, practice coughing to be sure your lungs are clear. If you have an incision (the cut made at the time of surgery), support your incision when coughing by placing a pillow or rolled up towels firmly against it. Once you are able to get out of bed, walk around indoors and cough well. You may stop using the incentive spirometer when instructed by your caregiver.  RISKS AND COMPLICATIONS Take your time so you do not get dizzy or light-headed. If you are in pain, you may need to take or ask for pain medication before doing incentive spirometry. It is harder to take a deep breath if you are having pain. AFTER USE Rest and breathe slowly and easily. It can be helpful to keep track of a log of your progress. Your caregiver can provide you with a simple table to help with this. If you are using the spirometer at home, follow these instructions: SEEK MEDICAL CARE IF:  You are having difficultly using the spirometer. You have trouble using the spirometer as often as instructed. Your pain medication is not giving enough relief while using the spirometer. You develop fever of 100.5 F (38.1 C) or higher. SEEK IMMEDIATE MEDICAL CARE IF:  You cough up bloody sputum that had not been present before. You develop fever of 102 F (38.9 C) or greater. You develop worsening pain at or near the incision site. MAKE SURE YOU:  Understand these instructions. Will watch your condition. Will get help right away if you are not doing well or get worse. Document Released: 01/12/2007 Document Revised: 11/24/2011 Document Reviewed: 03/15/2007 [facility] Patient Information 2014 [place], [place].   ________________________________________________________________________

## 2023-01-07 DIAGNOSIS — M1612 Unilateral primary osteoarthritis, left hip: Secondary | ICD-10-CM | POA: Diagnosis not present

## 2023-01-08 ENCOUNTER — Other Ambulatory Visit: Payer: Self-pay

## 2023-01-08 ENCOUNTER — Encounter (HOSPITAL_COMMUNITY): Payer: Self-pay

## 2023-01-08 ENCOUNTER — Encounter (HOSPITAL_COMMUNITY)
Admission: RE | Admit: 2023-01-08 | Discharge: 2023-01-08 | Disposition: A | Discharge status: Home / Self Care | Payer: Medicare Other | Source: Ambulatory Visit | Attending: Orthopedic Surgery | Admitting: Orthopedic Surgery

## 2023-01-08 VITALS — BP 133/80 | HR 65 | Temp 97.9°F | Resp 16 | Ht 64.0 in | Wt 155.0 lb

## 2023-01-08 DIAGNOSIS — I498 Other specified cardiac arrhythmias: Secondary | ICD-10-CM | POA: Diagnosis not present

## 2023-01-08 DIAGNOSIS — I4510 Unspecified right bundle-branch block: Secondary | ICD-10-CM | POA: Insufficient documentation

## 2023-01-08 DIAGNOSIS — Z01818 Encounter for other preprocedural examination: Secondary | ICD-10-CM | POA: Diagnosis not present

## 2023-01-08 HISTORY — DX: Hypothyroidism, unspecified: E03.9

## 2023-01-08 HISTORY — DX: Unspecified malignant neoplasm of skin, unspecified: C44.90

## 2023-01-08 HISTORY — DX: Sleep apnea, unspecified: G47.30

## 2023-01-08 LAB — BASIC METABOLIC PANEL
Anion gap: 8 (ref 5–15)
BUN: 15 mg/dL (ref 8–23)
CO2: 27 mmol/L (ref 22–32)
Calcium: 8.8 mg/dL — ABNORMAL LOW (ref 8.9–10.3)
Chloride: 106 mmol/L (ref 98–111)
Creatinine, Ser: 0.85 mg/dL (ref 0.44–1.00)
GFR, Estimated: >60 mL/min (ref >60)
Glucose, Bld: 91 mg/dL (ref 70–99)
Potassium: 3.7 mmol/L (ref 3.5–5.1)
Sodium: 141 mmol/L (ref 135–145)

## 2023-01-08 LAB — CBC
HCT: 40.1 % (ref 36.0–46.0)
Hemoglobin: 12.6 g/dL (ref 12.0–15.0)
MCH: 31.2 pg (ref 26.0–34.0)
MCHC: 31.4 g/dL (ref 30.0–36.0)
MCV: 99.3 fL (ref 80.0–100.0)
Platelets: 204 10*3/uL (ref 150–400)
RBC: 4.04 MIL/uL (ref 3.87–5.11)
RDW: 13.2 % (ref 11.5–15.5)
WBC: 6.3 10*3/uL (ref 4.0–10.5)
nRBC: 0 % (ref 0.0–0.2)

## 2023-01-08 LAB — SURGICAL PCR SCREEN
MRSA, PCR: NEGATIVE
Staphylococcus aureus: NEGATIVE

## 2023-01-13 ENCOUNTER — Ambulatory Visit: Payer: Medicare Other | Attending: Internal Medicine | Admitting: Internal Medicine

## 2023-01-13 ENCOUNTER — Encounter: Payer: Self-pay | Admitting: Internal Medicine

## 2023-01-13 VITALS — BP 162/70 | HR 65 | Ht 64.0 in | Wt 159.6 lb

## 2023-01-13 DIAGNOSIS — Z01810 Encounter for preprocedural cardiovascular examination: Secondary | ICD-10-CM | POA: Insufficient documentation

## 2023-01-13 NOTE — Patient Instructions (Addendum)
Medication Instructions:   Your physician recommends that you continue on your current medications as directed. Please refer to the Current Medication list given to you today.  *If you need a refill on your cardiac medications before your next appointment, please call your pharmacy*  Lab Work:  NONE ordered at this time of appointment   If you have labs (blood work) drawn today and your tests are completely normal, you will receive your results only by: MyChart Message (if you have MyChart) OR A paper copy in the mail If you have any lab test that is abnormal or we need to change your treatment, we will call you to review the results.  Testing/Procedures: NONE ordered at this time of appointment   Follow-Up: At [facility], you and your health needs are our priority.  As part of our continuing mission to provide you with exceptional heart care, we have created designated Provider Care Teams.  These Care Teams include your primary Cardiologist (physician) and Advanced Practice Providers (APPs -  Physician Assistants and Nurse Practitioners) who all work together to provide you with the care you need, when you need it.  Your next appointment:   As needed   Provider:   Maisie Fus, MD     Other Instructions

## 2023-01-13 NOTE — Progress Notes (Signed)
Cardiology Office Note:    Date:  01/13/2023   ID:  Mary Buck, DOB 07-10-1936, MRN [id number]  PCP:  Pearline Cables, MD   [facility] Providers Cardiologist:  Thomasene Ripple, DO     Referring MD: Pearline Cables, MD   No chief complaint on file. Pre-OP  History of Present Illness:    Mary Buck is a 87 y.o. female with a hx of asthma, GERD,  OSA-not using CPAP, hypothryoid, referral for pre-op for L hip sx  This is the first time I'm meeting Mary Buck. She is planned for hip sx. In terms of her activity level is limited 2/2 hip pain. Can do one flight of stairs, no CP or SOB.   Her echo 08/13/2020 showed normal LV function, no WMA, normal RV fxn. No significant valve dx.  No cardiac dx history.  Had a stress test. She had treadmill/SPECT. All normal. On in [place]. Two in [place], MD.  EKG NSR, IRBBB No orthopnea/PND. No significant LE edema Smoked in the past, 57 years Bp was normal on 4/25, today she was frustrated today and BP was high today. Not on anti-hypertensive  Past Medical History:  Diagnosis Date   Allergy    Asthma    as teen   Cataract    DDD (degenerative disc disease), lumbar 06/01/2017   Depression    GERD (gastroesophageal reflux disease)    Heart murmur    no issues   Hyperlipidemia    Hypothyroidism    IDA (iron deficiency anemia) 05/31/2020   Peripheral neuropathy 04/30/2017   Skin cancer    left leg had radiation, bladder cancer   Sleep apnea    no CPAP   Thyroid disease     Past Surgical History:  Procedure Laterality Date   bladder cancer  2013   CARPAL TUNNEL RELEASE  2015   CATARACT EXTRACTION Bilateral 2012   CHOLECYSTECTOMY     COLONOSCOPY WITH ESOPHAGOGASTRODUODENOSCOPY (EGD)  02/22/2018   [facility]   SALIVARY GLAND SURGERY Left 2015   benign   SPINE SURGERY  6/31/21   vertaflex   TONSILLECTOMY AND ADENOIDECTOMY  1943    Current Medications: Current Outpatient  Medications on File Prior to Visit  Medication Sig Dispense Refill   acetaminophen (TYLENOL) 325 MG tablet Take 650 mg by mouth 3 (three) times daily as needed for moderate pain.     Carboxymethylcellulose Sod PF (LUBRICANT EYE DROPS PF) 0.5 % SOLN Place 1 drop into both eyes 4 (four) times daily as needed (dry eyes).     Carboxymethylcellulose Sodium (REFRESH LIQUIGEL) 1 % GEL Place 1 drop into both eyes at bedtime.     famotidine (PEPCID) 20 MG tablet TAKE 1 TABLET(20 MG) BY MOUTH AT BEDTIME 90 tablet 3   gabapentin (NEURONTIN) 300 MG capsule Take 1 capsule (300 mg total) by mouth 3 (three) times daily. (Patient taking differently: Take 300 mg by mouth 2 (two) times daily before lunch and supper.) 90 capsule 4   lansoprazole (PREVACID) 30 MG capsule TAKE 1 CAPSULE(30 MG) BY MOUTH TWICE DAILY BEFORE A MEAL 180 capsule 1   levothyroxine (SYNTHROID) 88 MCG tablet Take 1 tablet (88 mcg total) by mouth daily before breakfast. 90 tablet 1   misoprostol (CYTOTEC) 100 MCG tablet TAKE 2 TABLETS BY MOUTH AFTER BREAKFAST AND AFTER DINNER AS DIRECTED 120 tablet 5   nystatin (MYCOSTATIN/NYSTOP) powder Apply 1 Application topically 3 (three) times daily. (Patient taking differently: Apply  1 Application topically daily.) 30 g 1   Probiotic Product (RISAQUAD PO) Take 1 capsule by mouth daily.     simvastatin (ZOCOR) 20 MG tablet TAKE 1 TABLET(20 MG) BY MOUTH DAILY 90 tablet 3   sodium chloride (MURO 128) 5 % ophthalmic solution Place 1 drop into both eyes at bedtime.     traMADol (ULTRAM) 50 MG tablet Take 50 mg by mouth 3 (three) times daily.     Vitamin D-Vitamin K (VITAMIN K2-VITAMIN D3 PO) Take 1 tablet by mouth daily.     No current facility-administered medications on file prior to visit.     Allergies:   Contrast media [iodinated contrast media], Oxycodone, Latex, Mirtazapine, Acitretin, Cephalexin, Elemental sulfur, Penicillins, and Sulfa antibiotics   Social History   Socioeconomic History    Marital status: Significant Other    Spouse name: Not on file   Number of children: 0   Years of education: 16   Highest education level: Not on file  Occupational History   Occupation: retired  Tobacco Use   Smoking status: Former    Types: Cigarettes    Quit date: 2015    Years since quitting: 9.3   Smokeless tobacco: Never  Vaping Use   Vaping Use: Never used  Substance and Sexual Activity   Alcohol use: No   Drug use: No   Sexual activity: Not Currently  Other Topics Concern   Not on file  Social History Narrative   Not on file   Social Determinants of Health   Financial Resource Strain: Low Risk  (07/30/2021)   Overall Financial Resource Strain (CARDIA)    Difficulty of Paying Living Expenses: Not hard at all  Food Insecurity: No Food Insecurity (11/13/2022)   Hunger Vital Sign    Worried About Running Out of Food in the Last Year: Never true    Ran Out of Food in the Last Year: Never true  Transportation Needs: No Transportation Needs (11/13/2022)   PRAPARE - Administrator, Civil Service (Medical): No    Lack of Transportation (Non-Medical): No  Physical Activity: Insufficiently Active (07/30/2021)   Exercise Vital Sign    Days of Exercise per Week: 7 days    Minutes of Exercise per Session: 20 min  Stress: Stress Concern Present (07/30/2021)   [employer] of Occupational Health - Occupational Stress Questionnaire    Feeling of Stress : To some extent  Social Connections: Moderately Isolated (07/30/2021)   Social Connection and Isolation Panel [NHANES]    Frequency of Communication with Friends and Family: Once a week    Frequency of Social Gatherings with Friends and Family: Once a week    Attends Religious Services: More than 4 times per year    Active Member of [employer] or Organizations: No    Attends Engineer, structural: Never    Marital Status: Living with partner     Family History: The patient's family history includes Cancer  in her brother; Hyperlipidemia in her father; Macular degeneration in her mother; Prostate cancer in an other family member; Stroke in her father. There is no history of Colon cancer.  ROS:   Please see the history of present illness.     All other systems reviewed and are negative.  EKGs/Labs/Other Studies Reviewed:    The following studies were reviewed today:   EKG:  EKG is  ordered today.  The ekg ordered today demonstrates   TTE 08/13/2020 1. Left ventricular ejection fraction, by estimation,  is 60 to 65%. The  left ventricle has normal function. The left ventricle has no regional  wall motion abnormalities. There is mild concentric left ventricular  hypertrophy. Indeterminate diastolic  filling due to E-A fusion.   2. Right ventricular systolic function is normal. The right ventricular  size is normal. There is normal pulmonary artery systolic pressure.   3. The mitral valve is normal in structure. No evidence of mitral valve  regurgitation. No evidence of mitral stenosis.   4. The aortic valve is normal in structure. Aortic valve regurgitation is  not visualized. No aortic stenosis is present.   5. The inferior vena cava is normal in size with greater than 50%  respiratory variability, suggesting right atrial pressure of 3 mmHg.     Recent Labs: 02/27/2022: ALT 10 08/14/2022: TSH 2.61 01/08/2023: BUN 15; Creatinine, Ser 0.85; Hemoglobin 12.6; Platelets 204; Potassium 3.7; Sodium 141  Recent Lipid Panel    Component Value Date/Time   CHOL 154 06/17/2021 1409   TRIG 113.0 06/17/2021 1409   HDL 53.90 06/17/2021 1409   CHOLHDL 3 06/17/2021 1409   VLDL 22.6 06/17/2021 1409   LDLCALC 77 06/17/2021 1409   LDLDIRECT 95.0 05/02/2019 1542     Risk Assessment/Calculations:     Physical Exam:    VS:  Vitals:   01/13/23 1530  BP: (!) 162/70  Pulse: 65  SpO2: 96%    Wt Readings from Last 3 Encounters:  01/13/23 159 lb 9.6 oz (72.4 kg)  01/08/23 155 lb (70.3 kg)   11/13/22 161 lb 9.6 oz (73.3 kg)     GEN:  Well nourished, well developed in no acute distress HEENT: Normal NECK: No JVD; No carotid bruits CARDIAC: RRR, no murmurs, rubs, gallops RESPIRATORY:  Clear to auscultation without rales, wheezing or rhonchi  ABDOMEN: Soft, non-tender, non-distended MUSCULOSKELETAL:  No edema; No deformity  SKIN: Warm and dry NEUROLOGIC:  Alert and oriented x 3 PSYCHIATRIC:  Normal affect   ASSESSMENT:    Pre:Op: Mary Buck presents for pre-op for hip sx. She can walk > 4 METS without shortness of breath or chest pressure. No active CP. Her risk for CVD event includes age which cannot be modified. Her echo showed normal LV and RV fxn, no valve dx. EKG shows normal rhythm. No acute st-t changes. She is acceptable cardiac risk for hip sx  PLAN:    In order of problems listed above:  Acceptable cardiac risk for hip surgery     Medication Adjustments/Labs and Tests Ordered: Current medicines are reviewed at length with the patient today.  Concerns regarding medicines are outlined above.  Orders Placed This Encounter  Procedures   EKG 12-Lead   No orders of the defined types were placed in this encounter.   Patient Instructions  Medication Instructions:   Your physician recommends that you continue on your current medications as directed. Please refer to the Current Medication list given to you today.  *If you need a refill on your cardiac medications before your next appointment, please call your pharmacy*  Lab Work:  NONE ordered at this time of appointment   If you have labs (blood work) drawn today and your tests are completely normal, you will receive your results only by: MyChart Message (if you have MyChart) OR A paper copy in the mail If you have any lab test that is abnormal or we need to change your treatment, we will call you to review the results.  Testing/Procedures: NONE ordered at this  time of appointment   Follow-Up: At  [facility], you and your health needs are our priority.  As part of our continuing mission to provide you with exceptional heart care, we have created designated Provider Care Teams.  These Care Teams include your primary Cardiologist (physician) and Advanced Practice Providers (APPs -  Physician Assistants and Nurse Practitioners) who all work together to provide you with the care you need, when you need it.  Your next appointment:   As needed   Provider:   Thomasene Ripple, DO     Other Instructions     Signed, Maisie Fus, MD  01/13/2023 4:39 PM    [facility]

## 2023-01-14 NOTE — Progress Notes (Signed)
Anesthesia Chart Review   Case: [id number] Date/Time: 01/20/23 1250   Procedure: TOTAL HIP ARTHROPLASTY (Left: Hip)   Anesthesia type: Choice   Pre-op diagnosis: left hip DJD   Location: [facility] ROOM 07 / [facility]   Surgeons: Teryl Lucy, MD       DISCUSSION: 87 year old former smoker with history of GERD, sleep apnea, hypothyroidism, left hip DJD scheduled for above procedure 01/20/2023 with Dr. Teryl Lucy.  Patient last seen by cardiology 01/13/2023.  Per office visit note, "Mrs. Fajardo presents for pre-op for hip sx. She can walk > 4 METS without shortness of breath or chest pressure. No active CP. Her risk for CVD event includes age which cannot be modified. Her echo showed normal LV and RV fxn, no valve dx. EKG shows normal rhythm. No acute st-t changes. She is acceptable cardiac risk for hip sx"  Anticipate pt can proceed with planned procedure barring acute status change.   VS: There were no vitals taken for this visit.  PROVIDERS: Copland, Gwenlyn Found, MDis PCP   Carolan Clines, MD is Cardiologist  LABS: Labs reviewed: Acceptable for surgery. (all labs ordered are listed, but only abnormal results are displayed)  Labs Reviewed - No data to display   IMAGES:   EKG:   CV: Echo 08/13/2020 1. Left ventricular ejection fraction, by estimation, is 60 to 65%. The  left ventricle has normal function. The left ventricle has no regional  wall motion abnormalities. There is mild concentric left ventricular  hypertrophy. Indeterminate diastolic  filling due to E-A fusion.   2. Right ventricular systolic function is normal. The right ventricular  size is normal. There is normal pulmonary artery systolic pressure.   3. The mitral valve is normal in structure. No evidence of mitral valve  regurgitation. No evidence of mitral stenosis.   4. The aortic valve is normal in structure. Aortic valve regurgitation is  not visualized. No aortic stenosis is present.   5. The inferior vena cava  is normal in size with greater than 50%  respiratory variability, suggesting right atrial pressure of 3 mmHg.   Past Medical History:  Diagnosis Date   Allergy    Asthma    as teen   Cataract    DDD (degenerative disc disease), lumbar 06/01/2017   Depression    GERD (gastroesophageal reflux disease)    Heart murmur    no issues   Hyperlipidemia    Hypothyroidism    IDA (iron deficiency anemia) 05/31/2020   Peripheral neuropathy 04/30/2017   Skin cancer    left leg had radiation, bladder cancer   Sleep apnea    no CPAP   Thyroid disease     Past Surgical History:  Procedure Laterality Date   bladder cancer  2013   CARPAL TUNNEL RELEASE  2015   CATARACT EXTRACTION Bilateral 2012   CHOLECYSTECTOMY     COLONOSCOPY WITH ESOPHAGOGASTRODUODENOSCOPY (EGD)  02/22/2018   [facility]   SALIVARY GLAND SURGERY Left 2015   benign   SPINE SURGERY  6/31/21   vertaflex   TONSILLECTOMY AND ADENOIDECTOMY  1943    MEDICATIONS: No current facility-administered medications for this encounter.    acetaminophen (TYLENOL) 325 MG tablet   Carboxymethylcellulose Sod PF (LUBRICANT EYE DROPS PF) 0.5 % SOLN   Carboxymethylcellulose Sodium (REFRESH LIQUIGEL) 1 % GEL   famotidine (PEPCID) 20 MG tablet   gabapentin (NEURONTIN) 300 MG capsule   lansoprazole (PREVACID) 30 MG capsule   levothyroxine (SYNTHROID) 88 MCG  tablet   misoprostol (CYTOTEC) 100 MCG tablet   nystatin (MYCOSTATIN/NYSTOP) powder   Probiotic Product (RISAQUAD PO)   simvastatin (ZOCOR) 20 MG tablet   sodium chloride (MURO 128) 5 % ophthalmic solution   traMADol (ULTRAM) 50 MG tablet   Vitamin D-Vitamin K (VITAMIN K2-VITAMIN D3 PO)     Oran Dillenburg Ward, PA-C [facility] Pre-Surgical Testing [phone]

## 2023-01-16 NOTE — Care Plan (Signed)
Ortho Bundle Case Management Note  Patient Details  Name: Mary Buck MRN: [id number] Date of Birth: 28-Sep-1935  seen in the office for H&P. will discharge to home with family to assist. rolling walker ordered for home use. HHPT referral to [facility] and OPPT set up with [facility]. discharge instructions discussed, questions answered. some memory issues noted.  Patient and MD in agreement with plan. Choice offered.    DME Arranged:  Dan Humphreys rolling DME Agency:  [facility]  HH Arranged:  PT HH Agency:  [facility]  Additional Comments: Please contact me with any questions of if this plan should need to change.  Shauna Hugh,  RN,BSN,MHA,CCM  [facility]  [phone] 01/16/2023, 1:27 PM

## 2023-01-19 NOTE — Telephone Encounter (Signed)
Dr. Shelba Flake office called to let us know that the patient is scheduled for surgery tomorrow and they need the surgical clearance form back to them asap. They have received the cardiologist clearance form but need the one from her PCP. They stated that they will fax it to [place] again. Please advise.

## 2023-01-19 NOTE — H&P (Signed)
HIP ARTHROPLASTY ADMISSION H&P  Patient ID: Mary Buck MRN: [id number] DOB/AGE: [birth year] 85 y.o.  Chief Complaint: left hip pain.  Planned Procedure Date: 01/20/23 Medical Clearance by Dr. Dallas Schimke   Cardiac Clearance by Carolan Clines, MD  HPI: Mary Buck is a 87 y.o. female who presents for evaluation of left hip DJD. The patient has a history of pain and functional disability in the left hip due to arthritis and has failed non-surgical conservative treatments for greater than 12 weeks to include NSAID's and/or analgesics, corticosteriod injections, and activity modification.  Onset of symptoms was abrupt, starting 1 years ago with rapidlly worsening course since that time. The patient noted no past surgery on the left hip.  Patient currently rates pain at 2 out of 10 with activity. Patient has worsening of pain with activity and weight bearing and pain that interferes with activities of daily living.  Patient has evidence of joint space narrowing by imaging studies.  There is no active infection.  Past Medical History:  Diagnosis Date   Allergy    Asthma    as teen   Cataract    DDD (degenerative disc disease), lumbar 06/01/2017   Depression    GERD (gastroesophageal reflux disease)    Heart murmur    no issues   Hyperlipidemia    Hypothyroidism    IDA (iron deficiency anemia) 05/31/2020   Peripheral neuropathy 04/30/2017   Skin cancer    left leg had radiation, bladder cancer   Sleep apnea    no CPAP   Thyroid disease    Past Surgical History:  Procedure Laterality Date   bladder cancer  2013   CARPAL TUNNEL RELEASE  2015   CATARACT EXTRACTION Bilateral 2012   CHOLECYSTECTOMY     COLONOSCOPY WITH ESOPHAGOGASTRODUODENOSCOPY (EGD)  02/22/2018   [facility]   SALIVARY GLAND SURGERY Left 2015   benign   SPINE SURGERY  6/31/21   vertaflex   TONSILLECTOMY AND ADENOIDECTOMY  1943   Allergies  Allergen Reactions   Contrast Media  [Iodinated Contrast Media] Anaphylaxis   Oxycodone Anaphylaxis    "my throat swells with any opioid"   Latex Other (See Comments)    Other reaction(s): blistering, redness    Mirtazapine Nausea Only   Acitretin Rash   Cephalexin Other (See Comments)    Unknown   Elemental Sulfur Other (See Comments)    unknown   Penicillins Other (See Comments)    Unknown   Sulfa Antibiotics Other (See Comments)    Unknown   Prior to Admission medications   Medication Sig Start Date End Date Taking? Authorizing Provider  acetaminophen (TYLENOL) 325 MG tablet Take 650 mg by mouth 3 (three) times daily as needed for moderate pain.   Yes [provider]  Carboxymethylcellulose Sod PF (LUBRICANT EYE DROPS PF) 0.5 % SOLN Place 1 drop into both eyes 4 (four) times daily as needed (dry eyes).   Yes [provider]  Carboxymethylcellulose Sodium (REFRESH LIQUIGEL) 1 % GEL Place 1 drop into both eyes at bedtime.   Yes [provider]  famotidine (PEPCID) 20 MG tablet TAKE 1 TABLET(20 MG) BY MOUTH AT BEDTIME 05/06/22  Yes Cirigliano, Vito V, DO  gabapentin (NEURONTIN) 300 MG capsule Take 1 capsule (300 mg total) by mouth 3 (three) times daily. Patient taking differently: Take 300 mg by mouth 2 (two) times daily before lunch and supper. 04/30/17  Yes Copland, Gwenlyn Found, MD  lansoprazole (PREVACID) 30 MG capsule  TAKE 1 CAPSULE(30 MG) BY MOUTH TWICE DAILY BEFORE A MEAL 10/14/22  Yes Copland, Gwenlyn Found, MD  levothyroxine (SYNTHROID) 88 MCG tablet Take 1 tablet (88 mcg total) by mouth daily before breakfast. 12/30/22  Yes Copland, Gwenlyn Found, MD  misoprostol (CYTOTEC) 100 MCG tablet TAKE 2 TABLETS BY MOUTH AFTER BREAKFAST AND AFTER DINNER AS DIRECTED 05/21/22  Yes Cirigliano, Vito V, DO  nystatin (MYCOSTATIN/NYSTOP) powder Apply 1 Application topically 3 (three) times daily. Patient taking differently: Apply 1 Application topically daily. 04/09/22  Yes Copland, Gwenlyn Found, MD  Probiotic Product  (RISAQUAD PO) Take 1 capsule by mouth daily.   Yes [provider]  simvastatin (ZOCOR) 20 MG tablet TAKE 1 TABLET(20 MG) BY MOUTH DAILY 12/31/22  Yes Copland, Gwenlyn Found, MD  sodium chloride (MURO 128) 5 % ophthalmic solution Place 1 drop into both eyes at bedtime.   Yes [provider]  traMADol (ULTRAM) 50 MG tablet Take 50 mg by mouth 3 (three) times daily.   Yes [provider]  Vitamin D-Vitamin K (VITAMIN K2-VITAMIN D3 PO) Take 1 tablet by mouth daily.   Yes [provider]   Social History   Socioeconomic History   Marital status: Significant Other    Spouse name: Not on file   Number of children: 0   Years of education: 41   Highest education level: Not on file  Occupational History   Occupation: retired  Tobacco Use   Smoking status: Former    Types: Cigarettes    Quit date: 2015    Years since quitting: 9.3   Smokeless tobacco: Never  Vaping Use   Vaping Use: Never used  Substance and Sexual Activity   Alcohol use: No   Drug use: No   Sexual activity: Not Currently  Other Topics Concern   Not on file  Social History Narrative   Not on file   Social Determinants of Health   Financial Resource Strain: Low Risk  (07/30/2021)   Overall Financial Resource Strain (CARDIA)    Difficulty of Paying Living Expenses: Not hard at all  Food Insecurity: No Food Insecurity (11/13/2022)   Hunger Vital Sign    Worried About Running Out of Food in the Last Year: Never true    Ran Out of Food in the Last Year: Never true  Transportation Needs: No Transportation Needs (11/13/2022)   PRAPARE - Administrator, Civil Service (Medical): No    Lack of Transportation (Non-Medical): No  Physical Activity: Insufficiently Active (07/30/2021)   Exercise Vital Sign    Days of Exercise per Week: 7 days    Minutes of Exercise per Session: 20 min  Stress: Stress Concern Present (07/30/2021)   [employer] of Occupational Health -  Occupational Stress Questionnaire    Feeling of Stress : To some extent  Social Connections: Moderately Isolated (07/30/2021)   Social Connection and Isolation Panel [NHANES]    Frequency of Communication with Friends and Family: Once a week    Frequency of Social Gatherings with Friends and Family: Once a week    Attends Religious Services: More than 4 times per year    Active Member of [employer] or Organizations: No    Attends Banker Meetings: Never    Marital Status: Living with partner   Family History  Problem Relation Age of Onset   Macular degeneration Mother    Hyperlipidemia Father    Stroke Father    Cancer Brother        ?  stomach   Prostate cancer Other        in brothers   Colon cancer Neg Hx     ROS: Currently denies lightheadedness, dizziness, Fever, chills, CP, SOB.   No personal history of DVT, PE, MI, or CVA. No loose teeth. + dentures All other systems have been reviewed and were otherwise currently negative with the exception of those mentioned in the HPI and as above.  Objective: Vitals: Height 5 feet 4 inches, weight 159 pounds.  Temperature 97.8, BP 176/83, pulse 63, oxygen saturation 96% on room air.    Physical Exam: General: Alert, NAD. Trendelenberg Gait  HEENT: EOMI, Good Neck Extension  Pulm: No increased work of breathing.  Clear B/L A/P w/o crackle or wheeze.  CV: RRR, No m/g/r appreciated  GI: soft, NT, ND Neuro: Neuro without gross focal deficit.  Sensation intact distally Skin: No lesions in the area of chief complaint MSK/Surgical Site: she has about 0 to 85 degrees of forward flexion at the left hip.  She can internally rotate only to neutral.  Pain is elicited with passive internal and external rotation of the hip.  Dorsiflexion and plantarflexion are intact at the ankle.  Distal sensation is intact.  She has 2+ PT pulse.    Imaging Review Plain radiographs demonstrate severe degenerative joint disease of the left hip.   The  bone quality appears to be adequate for age and reported activity level.  Preoperative templating of the joint replacement has been completed, documented, and submitted to the Operating Room personnel in order to optimize intra-operative equipment management.  Assessment: DJD left hip    Plan: Plan for Procedure(s): TOTAL HIP ARTHROPLASTY  The patient history, physical exam, clinical judgement of the provider and imaging are consistent with end stage degenerative joint disease and total joint arthroplasty is deemed medically necessary. The treatment options including medical management, injection therapy, and arthroplasty were discussed at length. The risks and benefits of Procedure(s): TOTAL HIP ARTHROPLASTY were presented and reviewed.  The risks of nonoperative treatment, versus surgical intervention including but not limited to continued pain, aseptic loosening, stiffness, dislocation/subluxation, infection, bleeding, nerve injury, blood clots, cardiopulmonary complications, morbidity, mortality, among others were discussed. The patient verbalizes understanding and wishes to proceed with the plan.  Patient is being admitted for surgery, pain control, PT, prophylactic antibiotics, VTE prophylaxis, progressive ambulation, ADL's and discharge planning.   Dental prophylaxis discussed and recommended for 1 years postoperatively.  The patient does meet the criteria for TXA which will be used perioperatively.   Eliquis will be used postoperatively for DVT prophylaxis in addition to SCDs, and early ambulation. The patient is planning to be discharged home with OPPT in care of family   Armida Sans, Cordelia Poche 01/19/2023 12:59 PM  \

## 2023-01-19 NOTE — Anesthesia Preprocedure Evaluation (Signed)
Anesthesia Evaluation  Patient identified by MRN, date of birth, ID band Patient awake    Reviewed: Allergy & Precautions, H&P , NPO status , Patient's Chart, lab work & pertinent test results  Airway Mallampati: II  TM Distance: >3 FB Neck ROM: Full    Dental no notable dental hx. (+) Lower Dentures, Upper Dentures   Pulmonary sleep apnea , former smoker   Pulmonary exam normal breath sounds clear to auscultation       Cardiovascular Exercise Tolerance: Good negative cardio ROS Normal cardiovascular exam Rhythm:Regular Rate:Normal  2021 TTE 1. Left ventricular ejection fraction, by estimation, is 60 to 65%. The  left ventricle has normal function. The left ventricle has no regional  wall motion abnormalities. There is mild concentric left ventricular  hypertrophy. Indeterminate diastolic  filling due to E-A fusion.   2. Right ventricular systolic function is normal. The right ventricular  size is normal. There is normal pulmonary artery systolic pressure.   3. The mitral valve is normal in structure. No evidence of mitral valve  regurgitation. No evidence of mitral stenosis.   4. The aortic valve is normal in structure. Aortic valve regurgitation is  not visualized. No aortic stenosis is present.   5. The inferior vena cava is normal in size with greater than 50%  respiratory variability, suggesting right atrial pressure of 3 mmHg.      Neuro/Psych  PSYCHIATRIC DISORDERS Anxiety Depression     Neuromuscular disease negative neurological ROS  negative psych ROS   GI/Hepatic negative GI ROS, Neg liver ROS,GERD  ,,  Endo/Other  negative endocrine ROSHypothyroidism    Renal/GU Renal diseasenegative Renal ROS  negative genitourinary   Musculoskeletal negative musculoskeletal ROS (+) Arthritis ,  Fibromyalgia -DDD   Abdominal   Peds negative pediatric ROS (+)  Hematology negative hematology ROS (+) Lab Results       Component                Value               Date                      WBC                      6.3                 01/08/2023                HGB                      12.6                01/08/2023                HCT                      40.1                01/08/2023                       PLT                      204                 01/08/2023              Anesthesia Other  Findings ALL: Latex see list  Reproductive/Obstetrics negative OB ROS                             Anesthesia Physical Anesthesia Plan  ASA: 3  Anesthesia Plan: General   Post-op Pain Management: Ofirmev IV (intra-op)* and Precedex   Induction: Intravenous  PONV Risk Score and Plan: 4 or greater and Treatment may vary due to age or medical condition and Ondansetron  Airway Management Planned: Oral ETT  Additional Equipment: None  Intra-op Plan:   Post-operative Plan: Extubation in OR  Informed Consent: I have reviewed the patients History and Physical, chart, labs and discussed the procedure including the risks, benefits and alternatives for the proposed anesthesia with the patient or authorized representative who has indicated his/her understanding and acceptance.     Dental advisory given  Plan Discussed with: CRNA  Anesthesia Plan Comments:        Anesthesia Quick Evaluation

## 2023-01-19 NOTE — Telephone Encounter (Signed)
Form has been faxed.

## 2023-01-20 ENCOUNTER — Observation Stay (HOSPITAL_COMMUNITY)
Admission: RE | Admit: 2023-01-20 | Discharge: 2023-01-21 | Disposition: A | Discharge status: Home Health | Payer: Medicare Other | Source: Ambulatory Visit | Attending: Orthopedic Surgery | Admitting: Orthopedic Surgery

## 2023-01-20 ENCOUNTER — Ambulatory Visit (HOSPITAL_COMMUNITY): Payer: Medicare Other

## 2023-01-20 ENCOUNTER — Ambulatory Visit (HOSPITAL_COMMUNITY): Payer: Medicare Other | Admitting: Physician Assistant

## 2023-01-20 ENCOUNTER — Other Ambulatory Visit: Payer: Self-pay

## 2023-01-20 ENCOUNTER — Encounter (HOSPITAL_COMMUNITY)
Admission: RE | Disposition: A | Discharge status: Home Health | Payer: Self-pay | Source: Ambulatory Visit | Attending: Orthopedic Surgery

## 2023-01-20 ENCOUNTER — Encounter (HOSPITAL_COMMUNITY): Payer: Self-pay | Admitting: Orthopedic Surgery

## 2023-01-20 ENCOUNTER — Ambulatory Visit (HOSPITAL_BASED_OUTPATIENT_CLINIC_OR_DEPARTMENT_OTHER): Payer: Medicare Other | Admitting: Physician Assistant

## 2023-01-20 DIAGNOSIS — Z91040 Latex allergy status: Secondary | ICD-10-CM | POA: Diagnosis not present

## 2023-01-20 DIAGNOSIS — Z79899 Other long term (current) drug therapy: Secondary | ICD-10-CM | POA: Insufficient documentation

## 2023-01-20 DIAGNOSIS — J45909 Unspecified asthma, uncomplicated: Secondary | ICD-10-CM | POA: Insufficient documentation

## 2023-01-20 DIAGNOSIS — M1612 Unilateral primary osteoarthritis, left hip: Principal | ICD-10-CM | POA: Insufficient documentation

## 2023-01-20 DIAGNOSIS — Z96642 Presence of left artificial hip joint: Secondary | ICD-10-CM | POA: Diagnosis not present

## 2023-01-20 DIAGNOSIS — Z471 Aftercare following joint replacement surgery: Secondary | ICD-10-CM | POA: Diagnosis not present

## 2023-01-20 DIAGNOSIS — E039 Hypothyroidism, unspecified: Secondary | ICD-10-CM | POA: Insufficient documentation

## 2023-01-20 DIAGNOSIS — Z85828 Personal history of other malignant neoplasm of skin: Secondary | ICD-10-CM | POA: Diagnosis not present

## 2023-01-20 DIAGNOSIS — Z87891 Personal history of nicotine dependence: Secondary | ICD-10-CM | POA: Diagnosis not present

## 2023-01-20 DIAGNOSIS — F418 Other specified anxiety disorders: Secondary | ICD-10-CM | POA: Diagnosis not present

## 2023-01-20 DIAGNOSIS — Z8551 Personal history of malignant neoplasm of bladder: Secondary | ICD-10-CM | POA: Diagnosis not present

## 2023-01-20 DIAGNOSIS — G4730 Sleep apnea, unspecified: Secondary | ICD-10-CM | POA: Diagnosis not present

## 2023-01-20 HISTORY — PX: TOTAL HIP ARTHROPLASTY: SHX124

## 2023-01-20 SURGERY — ARTHROPLASTY, HIP, TOTAL,POSTERIOR APPROACH
Anesthesia: General | Site: Hip | Laterality: Left

## 2023-01-20 MED ORDER — PHENOL 1.4 % MT LIQD: 1.0000 | OROMUCOSAL | Status: DC | PRN

## 2023-01-20 MED ORDER — CEFAZOLIN SODIUM-DEXTROSE 1-4 GM/50ML-% IV SOLN
1.0000 g | Freq: Four times a day (QID) | INTRAVENOUS | Status: AC
  Administered 2023-01-20 – 2023-01-21 (×2): 1 g via INTRAVENOUS
  Filled 2023-01-20 (×2): qty 50

## 2023-01-20 MED ORDER — POVIDONE-IODINE 7.5 % EX SOLN: Freq: Once | CUTANEOUS | Status: DC

## 2023-01-20 MED ORDER — DEXMEDETOMIDINE HCL IN NACL 80 MCG/20ML IV SOLN
INTRAVENOUS | Status: DC | PRN
  Administered 2023-01-20 (×5): 4 ug via INTRAVENOUS

## 2023-01-20 MED ORDER — LIDOCAINE HCL (PF) 2 % IJ SOLN
INTRAMUSCULAR | Status: AC
  Filled 2023-01-20: qty 20

## 2023-01-20 MED ORDER — PANTOPRAZOLE SODIUM 40 MG PO TBEC
40.0000 mg | DELAYED_RELEASE_TABLET | Freq: Every day | ORAL | Status: DC
  Administered 2023-01-20 – 2023-01-21 (×2): 40 mg via ORAL
  Filled 2023-01-20 (×2): qty 1

## 2023-01-20 MED ORDER — ONDANSETRON HCL 4 MG/2ML IJ SOLN: 4.0000 mg | Freq: Four times a day (QID) | INTRAMUSCULAR | Status: DC | PRN

## 2023-01-20 MED ORDER — ACETAMINOPHEN 325 MG PO TABS: 325.0000 mg | ORAL_TABLET | Freq: Four times a day (QID) | ORAL | Status: DC | PRN

## 2023-01-20 MED ORDER — MENTHOL 3 MG MT LOZG: 1.0000 | LOZENGE | OROMUCOSAL | Status: DC | PRN

## 2023-01-20 MED ORDER — HYDROMORPHONE HCL 1 MG/ML IJ SOLN
0.2500 mg | INTRAMUSCULAR | Status: DC | PRN
  Administered 2023-01-20 (×4): 0.5 mg via INTRAVENOUS

## 2023-01-20 MED ORDER — ALUM & MAG HYDROXIDE-SIMETH 200-200-20 MG/5ML PO SUSP: 30.0000 mL | ORAL | Status: DC | PRN

## 2023-01-20 MED ORDER — HYDROMORPHONE HCL 1 MG/ML IJ SOLN
INTRAMUSCULAR | Status: AC
  Filled 2023-01-20: qty 2

## 2023-01-20 MED ORDER — DOCUSATE SODIUM 100 MG PO CAPS
100.0000 mg | ORAL_CAPSULE | Freq: Two times a day (BID) | ORAL | Status: DC
  Filled 2023-01-20 (×2): qty 1

## 2023-01-20 MED ORDER — APIXABAN 2.5 MG PO TABS: 2.5000 mg | ORAL_TABLET | Freq: Two times a day (BID) | ORAL | 0 refills | Status: DC

## 2023-01-20 MED ORDER — HYDROCODONE-ACETAMINOPHEN 10-325 MG PO TABS: 0.5000 | ORAL_TABLET | Freq: Four times a day (QID) | ORAL | 0 refills | Status: AC | PRN

## 2023-01-20 MED ORDER — METOCLOPRAMIDE HCL 5 MG/ML IJ SOLN: 5.0000 mg | Freq: Three times a day (TID) | INTRAMUSCULAR | Status: DC | PRN

## 2023-01-20 MED ORDER — HYDROCODONE-ACETAMINOPHEN 5-325 MG PO TABS
1.0000 | ORAL_TABLET | ORAL | Status: DC | PRN
  Administered 2023-01-20 – 2023-01-21 (×2): 1 via ORAL
  Filled 2023-01-20 (×2): qty 1

## 2023-01-20 MED ORDER — LACTATED RINGERS IV BOLUS
500.0000 mL | Freq: Once | INTRAVENOUS | Status: AC
  Administered 2023-01-20: 500 mL via INTRAVENOUS

## 2023-01-20 MED ORDER — POTASSIUM CHLORIDE IN NACL 20-0.9 MEQ/L-% IV SOLN
INTRAVENOUS | Status: DC
  Filled 2023-01-20 (×2): qty 1000

## 2023-01-20 MED ORDER — BUPIVACAINE HCL (PF) 0.25 % IJ SOLN
INTRAMUSCULAR | Status: AC
  Filled 2023-01-20: qty 30

## 2023-01-20 MED ORDER — SIMVASTATIN 20 MG PO TABS
20.0000 mg | ORAL_TABLET | Freq: Every day | ORAL | Status: DC
  Administered 2023-01-21: 20 mg via ORAL
  Filled 2023-01-20: qty 1

## 2023-01-20 MED ORDER — SENNA-DOCUSATE SODIUM 8.6-50 MG PO TABS: 2.0000 | ORAL_TABLET | Freq: Every day | ORAL | 1 refills | Status: DC

## 2023-01-20 MED ORDER — ORAL CARE MOUTH RINSE: 15.0000 mL | Freq: Once | OROMUCOSAL | Status: AC

## 2023-01-20 MED ORDER — LACTATED RINGERS IV SOLN: INTRAVENOUS | Status: DC

## 2023-01-20 MED ORDER — MAGNESIUM CITRATE PO SOLN: 1.0000 | Freq: Once | ORAL | Status: DC | PRN

## 2023-01-20 MED ORDER — SODIUM CHLORIDE (HYPERTONIC) 5 % OP SOLN
1.0000 [drp] | Freq: Every day | OPHTHALMIC | Status: DC
  Administered 2023-01-20: 1 [drp] via OPHTHALMIC
  Filled 2023-01-20 (×2): qty 15

## 2023-01-20 MED ORDER — APIXABAN 2.5 MG PO TABS
2.5000 mg | ORAL_TABLET | Freq: Two times a day (BID) | ORAL | Status: DC
  Administered 2023-01-21: 2.5 mg via ORAL
  Filled 2023-01-20: qty 1

## 2023-01-20 MED ORDER — METOCLOPRAMIDE HCL 5 MG PO TABS: 5.0000 mg | ORAL_TABLET | Freq: Three times a day (TID) | ORAL | Status: DC | PRN

## 2023-01-20 MED ORDER — ONDANSETRON 4 MG PO TBDP: 4.0000 mg | ORAL_TABLET | Freq: Three times a day (TID) | ORAL | 0 refills | Status: DC | PRN

## 2023-01-20 MED ORDER — CEFAZOLIN SODIUM-DEXTROSE 2-4 GM/100ML-% IV SOLN
2.0000 g | INTRAVENOUS | Status: AC
  Administered 2023-01-20: 2 g via INTRAVENOUS
  Filled 2023-01-20: qty 100

## 2023-01-20 MED ORDER — TRANEXAMIC ACID-NACL 1000-0.7 MG/100ML-% IV SOLN
1000.0000 mg | Freq: Once | INTRAVENOUS | Status: AC
  Administered 2023-01-20: 1000 mg via INTRAVENOUS
  Filled 2023-01-20: qty 100

## 2023-01-20 MED ORDER — PROPOFOL 500 MG/50ML IV EMUL
INTRAVENOUS | Status: AC
  Filled 2023-01-20: qty 200

## 2023-01-20 MED ORDER — ONDANSETRON HCL 4 MG/2ML IJ SOLN
INTRAMUSCULAR | Status: DC | PRN
  Administered 2023-01-20: 4 mg via INTRAVENOUS

## 2023-01-20 MED ORDER — HYDROCODONE-ACETAMINOPHEN 7.5-325 MG PO TABS: 1.0000 | ORAL_TABLET | ORAL | Status: DC | PRN

## 2023-01-20 MED ORDER — DIPHENHYDRAMINE HCL 12.5 MG/5ML PO ELIX: 12.5000 mg | ORAL_SOLUTION | ORAL | Status: DC | PRN

## 2023-01-20 MED ORDER — CARBOXYMETHYLCELLULOSE SODIUM 1 % OP GEL: 1.0000 [drp] | Freq: Every day | OPHTHALMIC | Status: DC

## 2023-01-20 MED ORDER — SUCCINYLCHOLINE CHLORIDE 200 MG/10ML IV SOSY
PREFILLED_SYRINGE | INTRAVENOUS | Status: AC
  Filled 2023-01-20: qty 10

## 2023-01-20 MED ORDER — TRANEXAMIC ACID-NACL 1000-0.7 MG/100ML-% IV SOLN
1000.0000 mg | INTRAVENOUS | Status: AC
  Administered 2023-01-20: 1000 mg via INTRAVENOUS
  Filled 2023-01-20: qty 100

## 2023-01-20 MED ORDER — DEXAMETHASONE SODIUM PHOSPHATE 10 MG/ML IJ SOLN
INTRAMUSCULAR | Status: AC
  Filled 2023-01-20: qty 3

## 2023-01-20 MED ORDER — MORPHINE SULFATE (PF) 2 MG/ML IV SOLN
0.5000 mg | INTRAVENOUS | Status: DC | PRN
  Administered 2023-01-21: 1 mg via INTRAVENOUS
  Filled 2023-01-20: qty 1

## 2023-01-20 MED ORDER — KETOROLAC TROMETHAMINE 30 MG/ML IJ SOLN
INTRAMUSCULAR | Status: DC | PRN
  Administered 2023-01-20: 30 mg

## 2023-01-20 MED ORDER — PHENYLEPHRINE HCL (PRESSORS) 10 MG/ML IV SOLN
INTRAVENOUS | Status: AC
  Filled 2023-01-20: qty 1

## 2023-01-20 MED ORDER — PHENYLEPHRINE HCL-NACL 20-0.9 MG/250ML-% IV SOLN
INTRAVENOUS | Status: DC | PRN
  Administered 2023-01-20: 15 ug/min via INTRAVENOUS

## 2023-01-20 MED ORDER — POVIDONE-IODINE 10 % EX SWAB: 2.0000 | Freq: Once | CUTANEOUS | Status: DC

## 2023-01-20 MED ORDER — ACETAMINOPHEN 10 MG/ML IV SOLN
1000.0000 mg | Freq: Once | INTRAVENOUS | Status: AC
  Administered 2023-01-20: 1000 mg via INTRAVENOUS

## 2023-01-20 MED ORDER — FAMOTIDINE 20 MG PO TABS
20.0000 mg | ORAL_TABLET | Freq: Every day | ORAL | Status: DC
  Administered 2023-01-20: 20 mg via ORAL
  Filled 2023-01-20: qty 1

## 2023-01-20 MED ORDER — FENTANYL CITRATE (PF) 250 MCG/5ML IJ SOLN
INTRAMUSCULAR | Status: DC | PRN
  Administered 2023-01-20 (×5): 50 ug via INTRAVENOUS

## 2023-01-20 MED ORDER — DEXAMETHASONE SODIUM PHOSPHATE 10 MG/ML IJ SOLN
10.0000 mg | Freq: Once | INTRAMUSCULAR | Status: AC
  Administered 2023-01-21: 10 mg via INTRAVENOUS
  Filled 2023-01-20: qty 1

## 2023-01-20 MED ORDER — BUPIVACAINE HCL (PF) 0.25 % IJ SOLN
INTRAMUSCULAR | Status: DC | PRN
  Administered 2023-01-20: 30 mL

## 2023-01-20 MED ORDER — POLYVINYL ALCOHOL 1.4 % OP SOLN: 1.0000 [drp] | Freq: Four times a day (QID) | OPHTHALMIC | Status: DC | PRN

## 2023-01-20 MED ORDER — ONDANSETRON HCL 4 MG PO TABS: 4.0000 mg | ORAL_TABLET | Freq: Four times a day (QID) | ORAL | Status: DC | PRN

## 2023-01-20 MED ORDER — BISACODYL 10 MG RE SUPP: 10.0000 mg | Freq: Every day | RECTAL | Status: DC | PRN

## 2023-01-20 MED ORDER — MISOPROSTOL 200 MCG PO TABS
200.0000 ug | ORAL_TABLET | Freq: Two times a day (BID) | ORAL | Status: DC
  Administered 2023-01-21: 200 ug via ORAL
  Filled 2023-01-20 (×2): qty 1

## 2023-01-20 MED ORDER — CARBOXYMETHYLCELLULOSE SOD PF 0.5 % OP SOLN: 1.0000 [drp] | Freq: Four times a day (QID) | OPHTHALMIC | Status: DC | PRN

## 2023-01-20 MED ORDER — ROCURONIUM BROMIDE 10 MG/ML (PF) SYRINGE
PREFILLED_SYRINGE | INTRAVENOUS | Status: AC
  Filled 2023-01-20: qty 20

## 2023-01-20 MED ORDER — LACTATED RINGERS IV BOLUS: 250.0000 mL | Freq: Once | INTRAVENOUS | Status: DC

## 2023-01-20 MED ORDER — ACETAMINOPHEN 10 MG/ML IV SOLN
INTRAVENOUS | Status: AC
  Filled 2023-01-20: qty 100

## 2023-01-20 MED ORDER — EPHEDRINE 5 MG/ML INJ
INTRAVENOUS | Status: AC
  Filled 2023-01-20: qty 5

## 2023-01-20 MED ORDER — ACETAMINOPHEN 500 MG PO TABS
500.0000 mg | ORAL_TABLET | Freq: Four times a day (QID) | ORAL | Status: DC
  Administered 2023-01-20 – 2023-01-21 (×3): 500 mg via ORAL
  Filled 2023-01-20 (×4): qty 1

## 2023-01-20 MED ORDER — ONDANSETRON HCL 4 MG/2ML IJ SOLN
INTRAMUSCULAR | Status: AC
  Filled 2023-01-20: qty 8

## 2023-01-20 MED ORDER — DEXAMETHASONE SODIUM PHOSPHATE 10 MG/ML IJ SOLN
INTRAMUSCULAR | Status: DC | PRN
  Administered 2023-01-20: 5 mg via INTRAVENOUS

## 2023-01-20 MED ORDER — ACETAMINOPHEN 500 MG PO TABS
1000.0000 mg | ORAL_TABLET | Freq: Once | ORAL | Status: DC
  Filled 2023-01-20: qty 2

## 2023-01-20 MED ORDER — PHENYLEPHRINE 80 MCG/ML (10ML) SYRINGE FOR IV PUSH (FOR BLOOD PRESSURE SUPPORT)
PREFILLED_SYRINGE | INTRAVENOUS | Status: AC
  Filled 2023-01-20: qty 20

## 2023-01-20 MED ORDER — FENTANYL CITRATE (PF) 250 MCG/5ML IJ SOLN
INTRAMUSCULAR | Status: AC
  Filled 2023-01-20: qty 5

## 2023-01-20 MED ORDER — ROCURONIUM BROMIDE 10 MG/ML (PF) SYRINGE
PREFILLED_SYRINGE | INTRAVENOUS | Status: DC | PRN
  Administered 2023-01-20: 50 mg via INTRAVENOUS
  Administered 2023-01-20: 20 mg via INTRAVENOUS

## 2023-01-20 MED ORDER — KETOROLAC TROMETHAMINE 30 MG/ML IJ SOLN
INTRAMUSCULAR | Status: AC
  Filled 2023-01-20: qty 1

## 2023-01-20 MED ORDER — LEVOTHYROXINE SODIUM 88 MCG PO TABS
88.0000 ug | ORAL_TABLET | Freq: Every day | ORAL | Status: DC
  Administered 2023-01-21: 88 ug via ORAL
  Filled 2023-01-20: qty 1

## 2023-01-20 MED ORDER — GABAPENTIN 300 MG PO CAPS
300.0000 mg | ORAL_CAPSULE | Freq: Two times a day (BID) | ORAL | Status: DC
  Administered 2023-01-21: 300 mg via ORAL
  Filled 2023-01-20: qty 1

## 2023-01-20 MED ORDER — PROPOFOL 10 MG/ML IV BOLUS
INTRAVENOUS | Status: DC | PRN
  Administered 2023-01-20: 140 mg via INTRAVENOUS

## 2023-01-20 MED ORDER — ARTIFICIAL TEARS OPHTHALMIC OINT
TOPICAL_OINTMENT | Freq: Every day | OPHTHALMIC | Status: DC
  Filled 2023-01-20: qty 3.5

## 2023-01-20 MED ORDER — ONDANSETRON HCL 4 MG/2ML IJ SOLN: 4.0000 mg | Freq: Once | INTRAMUSCULAR | Status: DC | PRN

## 2023-01-20 MED ORDER — LIDOCAINE 2% (20 MG/ML) 5 ML SYRINGE
INTRAMUSCULAR | Status: DC | PRN
  Administered 2023-01-20: 100 mg via INTRAVENOUS

## 2023-01-20 MED ORDER — POLYETHYLENE GLYCOL 3350 17 G PO PACK: 17.0000 g | PACK | Freq: Every day | ORAL | Status: DC | PRN

## 2023-01-20 MED ORDER — DEXMEDETOMIDINE HCL IN NACL 80 MCG/20ML IV SOLN
INTRAVENOUS | Status: AC
  Filled 2023-01-20: qty 20

## 2023-01-20 MED ORDER — CHLORHEXIDINE GLUCONATE 0.12 % MT SOLN
15.0000 mL | Freq: Once | OROMUCOSAL | Status: AC
  Administered 2023-01-20: 15 mL via OROMUCOSAL

## 2023-01-20 SURGICAL SUPPLY — 60 items
BAG COUNTER SPONGE SURGICOUNT (BAG) IMPLANT
BAG SPNG CNTER NS LX DISP (BAG)
BIT DRILL 2.0X128 (BIT) ×1 IMPLANT
BLADE SAW SGTL 73X25 THK (BLADE) ×1 IMPLANT
CLSR STERI-STRIP ANTIMIC 1/2X4 (GAUZE/BANDAGES/DRESSINGS) ×2 IMPLANT
COVER SURGICAL LIGHT HANDLE (MISCELLANEOUS) ×1 IMPLANT
CUP SECTOR GRIPTON 50MM (Cup) IMPLANT
DRAPE INCISE IOBAN 66X45 STRL (DRAPES) ×1 IMPLANT
DRAPE ORTHO SPLIT 77X108 STRL (DRAPES) ×2
DRAPE POUCH INSTRU U-SHP 10X18 (DRAPES) ×1 IMPLANT
DRAPE SHEET LG 3/4 BI-LAMINATE (DRAPES) ×1 IMPLANT
DRAPE SURG 17X11 SM STRL (DRAPES) ×1 IMPLANT
DRAPE SURG ORHT 6 SPLT 77X108 (DRAPES) ×2 IMPLANT
DRAPE U-SHAPE 47X51 STRL (DRAPES) ×1 IMPLANT
DRSG MEPILEX POST OP 4X8 (GAUZE/BANDAGES/DRESSINGS) ×1 IMPLANT
DURAPREP 26ML APPLICATOR (WOUND CARE) ×2 IMPLANT
ELECT BLADE TIP CTD 4 INCH (ELECTRODE) ×1 IMPLANT
ELECT REM PT RETURN 15FT ADLT (MISCELLANEOUS) ×1 IMPLANT
ELIMINATOR HOLE APEX DEPUY (Hips) IMPLANT
FACESHIELD WRAPAROUND (MASK) ×2 IMPLANT
FACESHIELD WRAPAROUND OR TEAM (MASK) ×2 IMPLANT
GLOVE BIO SURGEON STRL SZ 6.5 (GLOVE) ×1 IMPLANT
GLOVE BIO SURGEON STRL SZ7.5 (GLOVE) ×1 IMPLANT
GLOVE BIOGEL PI IND STRL 7.0 (GLOVE) ×1 IMPLANT
GLOVE BIOGEL PI IND STRL 8 (GLOVE) ×1 IMPLANT
GOWN STRL SURGICAL XL XLNG (GOWN DISPOSABLE) ×2 IMPLANT
HEAD FEM STD 32X+1 STRL (Hips) IMPLANT
HOOD PEEL AWAY T7 (MISCELLANEOUS) ×3 IMPLANT
K-WIRE TROCAR PT 2.0 150MM (WIRE) ×1
KIT BASIN OR (CUSTOM PROCEDURE TRAY) ×1 IMPLANT
KIT TURNOVER KIT A (KITS) IMPLANT
KWIRE TROCAR PT 2.0 150 (WIRE) ×1 IMPLANT
KWIRE TROCAR PT 2.0 150MM (WIRE) ×1 IMPLANT
LINER ACET PNNCL PLUS4 NEUTRAL (Hips) IMPLANT
MANIFOLD NEPTUNE II (INSTRUMENTS) ×1 IMPLANT
NDL MA TROC 1/2 (NEEDLE) IMPLANT
NDL SAFETY ECLIP 18X1.5 (MISCELLANEOUS) ×2 IMPLANT
NEEDLE ANCHOR KEITH 2 7/8 STR (NEEDLE) ×1 IMPLANT
NEEDLE MA TROC 1/2 (NEEDLE) IMPLANT
NS IRRIG 1000ML POUR BTL (IV SOLUTION) ×1 IMPLANT
PACK TOTAL JOINT (CUSTOM PROCEDURE TRAY) ×1 IMPLANT
PINNACLE PLUS 4 NEUTRAL (Hips) ×1 IMPLANT
PROTECTOR NERVE ULNAR (MISCELLANEOUS) ×1 IMPLANT
SCREW 6.5MMX25MM (Screw) IMPLANT
SUCTION FRAZIER HANDLE 12FR (TUBING) ×1
SUCTION TUBE FRAZIER 12FR DISP (TUBING) ×1 IMPLANT
SUT ETHIBOND NAB CT1 #1 30IN (SUTURE) ×3 IMPLANT
SUT VIC AB 0 CT1 36 (SUTURE) ×1 IMPLANT
SUT VIC AB 1 CT1 36 (SUTURE) ×2 IMPLANT
SUT VIC AB 2-0 CT1 27 (SUTURE) ×3
SUT VIC AB 2-0 CT1 TAPERPNT 27 (SUTURE) ×3 IMPLANT
SUT VIC AB 3-0 SH 27 (SUTURE) ×2
SUT VIC AB 3-0 SH 27X BRD (SUTURE) ×2 IMPLANT
SUT VIC AB 3-0 SH 8-18 (SUTURE) ×1 IMPLANT
SYR CONTROL 10ML LL (SYRINGE) ×2 IMPLANT
TAP DUOFIX SZ5 STD OFF (Hips) IMPLANT
TOWEL OR 17X26 10 PK STRL BLUE (TOWEL DISPOSABLE) ×1 IMPLANT
TRAY FOLEY MTR SLVR 16FR STAT (SET/KITS/TRAYS/PACK) ×1 IMPLANT
TUBE SUCTION HIGH CAP CLEAR NV (SUCTIONS) ×1 IMPLANT
WATER STERILE IRR 1000ML POUR (IV SOLUTION) ×2 IMPLANT

## 2023-01-20 NOTE — Anesthesia Procedure Notes (Signed)
Date/Time: 01/20/2023 4:19 PM  Performed by: Minerva Ends, CRNAOxygen Delivery Method: Simple face mask Placement Confirmation: positive ETCO2 and breath sounds checked- equal and bilateral Dental Injury: Teeth and Oropharynx as per pre-operative assessment

## 2023-01-20 NOTE — Plan of Care (Signed)
  Problem: Education: Goal: Knowledge of the prescribed therapeutic regimen will improve Outcome: Progressing   Problem: Activity: Goal: Ability to avoid complications of mobility impairment will improve Outcome: Progressing   Problem: Pain Management: Goal: Pain level will decrease with appropriate interventions Outcome: Progressing   Problem: Education: Goal: Knowledge of General Education information will improve Description: Including pain rating scale, medication(s)/side effects and non-pharmacologic comfort measures Outcome: Progressing   Problem: Activity: Goal: Risk for activity intolerance will decrease Outcome: Progressing   Problem: Nutrition: Goal: Adequate nutrition will be maintained Outcome: Progressing   Problem: Elimination: Goal: Will not experience complications related to bowel motility Outcome: Progressing   Problem: Pain Managment: Goal: General experience of comfort will improve Outcome: Progressing   Problem: Safety: Goal: Ability to remain free from injury will improve Outcome: Progressing   

## 2023-01-20 NOTE — Anesthesia Procedure Notes (Signed)
Procedure Name: Intubation Date/Time: 01/20/2023 2:29 PM  Performed by: Minerva Ends, CRNAPre-anesthesia Checklist: Patient identified, Emergency Drugs available, Suction available and Patient being monitored Patient Re-evaluated:Patient Re-evaluated prior to induction Oxygen Delivery Method: Circle System Utilized Preoxygenation: Pre-oxygenation with 100% oxygen Induction Type: IV induction and Cricoid Pressure applied Ventilation: Mask ventilation without difficulty Laryngoscope Size: Miller and 2 Grade View: Grade I Tube type: Oral Number of attempts: 1 Airway Equipment and Method: Stylet Placement Confirmation: ETT inserted through vocal cords under direct vision, positive ETCO2 and breath sounds checked- equal and bilateral Secured at: 21 cm Tube secured with: Tape Dental Injury: Teeth and Oropharynx as per pre-operative assessment  Comments: IV induction Houser-- intubation AM CRNA atraumatic-- mouth as preop-- post for dentures on lower-- unchanged--- RN took dentures--Ashley.

## 2023-01-20 NOTE — Discharge Instructions (Addendum)
INSTRUCTIONS AFTER JOINT REPLACEMENT   Remove items at home which could result in a fall. This includes throw rugs or furniture in walking pathways ICE to the affected joint every three hours while awake for 30 minutes at a time, for at least the first 3-5 days, and then as needed for pain and swelling.  Continue to use ice for pain and swelling. You may notice swelling that will progress down to the foot and ankle.  This is normal after surgery.  Elevate your leg when you are not up walking on it.   Continue to use the breathing machine you got in the hospital (incentive spirometer) which will help keep your temperature down.  It is common for your temperature to cycle up and down following surgery, especially at night when you are not up moving around and exerting yourself.  The breathing machine keeps your lungs expanded and your temperature down.   DIET:  As you were doing prior to hospitalization, we recommend a well-balanced diet.  DRESSING / WOUND CARE / SHOWERING  You may shower 3 days after surgery, but keep the wounds dry during showering.  You may use an occlusive plastic wrap (Press'n Seal for example), NO SOAKING/SUBMERGING IN THE BATHTUB.  If the bandage gets wet, change with a clean dry gauze.  If the incision gets wet, pat the wound dry with a clean towel.  ACTIVITY  Increase activity slowly as tolerated, but follow the weight bearing instructions below.   No driving for 6 weeks or until further direction given by your physician.  You cannot drive while taking narcotics.  No lifting or carrying greater than 10 lbs. until further directed by your surgeon. Avoid periods of inactivity such as sitting longer than an hour when not asleep. This helps prevent blood clots.  You may return to work once you are authorized by your doctor.     WEIGHT BEARING   Weight bearing as tolerated with assist device (walker, cane, etc) as directed, use it as long as suggested by your surgeon or  therapist, typically at least 4-6 weeks.   EXERCISES  Results after joint replacement surgery are often greatly improved when you follow the exercise, range of motion and muscle strengthening exercises prescribed by your doctor. Safety measures are also important to protect the joint from further injury. Any time any of these exercises cause you to have increased pain or swelling, decrease what you are doing until you are comfortable again and then slowly increase them. If you have problems or questions, call your caregiver or physical therapist for advice.   Rehabilitation is important following a joint replacement. After just a few days of immobilization, the muscles of the leg can become weakened and shrink (atrophy).  These exercises are designed to build up the tone and strength of the thigh and leg muscles and to improve motion. Often times heat used for twenty to thirty minutes before working out will loosen up your tissues and help with improving the range of motion but do not use heat for the first two weeks following surgery (sometimes heat can increase post-operative swelling).   These exercises can be done on a training (exercise) mat, on the floor, on a table or on a bed. Use whatever works the best and is most comfortable for you.    Use music or television while you are exercising so that the exercises are a pleasant break in your day. This will make your life better with the exercises acting   as a break in your routine that you can look forward to.   Perform all exercises about fifteen times, three times per day or as directed.  You should exercise both the operative leg and the other leg as well.  Exercises include:   Quad Sets - Tighten up the muscle on the front of the thigh (Quad) and hold for 5-10 seconds.   Straight Leg Raises - With your knee straight (if you were given a brace, keep it on), lift the leg to 60 degrees, hold for 3 seconds, and slowly lower the leg.  Perform this  exercise against resistance later as your leg gets stronger.  Leg Slides: Lying on your back, slowly slide your foot toward your buttocks, bending your knee up off the floor (only go as far as is comfortable). Then slowly slide your foot back down until your leg is flat on the floor again.  Angel Wings: Lying on your back spread your legs to the side as far apart as you can without causing discomfort.  Hamstring Strength:  Lying on your back, push your heel against the floor with your leg straight by tightening up the muscles of your buttocks.  Repeat, but this time bend your knee to a comfortable angle, and push your heel against the floor.  You may put a pillow under the heel to make it more comfortable if necessary.   A rehabilitation program following joint replacement surgery can speed recovery and prevent re-injury in the future due to weakened muscles. Contact your doctor or a physical therapist for more information on knee rehabilitation.    CONSTIPATION  Constipation is defined medically as fewer than three stools per week and severe constipation as less than one stool per week.  Even if you have a regular bowel pattern at home, your normal regimen is likely to be disrupted due to multiple reasons following surgery.  Combination of anesthesia, postoperative narcotics, change in appetite and fluid intake all can affect your bowels.   YOU MUST use at least one of the following options; they are listed in order of increasing strength to get the job done.  They are all available over the counter, and you may need to use some, POSSIBLY even all of these options:    Drink plenty of fluids (prune juice may be helpful) and high fiber foods Colace 100 mg by mouth twice a day  Senokot for constipation as directed and as needed Dulcolax (bisacodyl), take with full glass of water  Miralax (polyethylene glycol) once or twice a day as needed.  If you have tried all these things and are unable to have a  bowel movement in the first 3-4 days after surgery call either your surgeon or your primary doctor.    If you experience loose stools or diarrhea, hold the medications until you stool forms back up.  If your symptoms do not get better within 1 week or if they get worse, check with your doctor.  If you experience "the worst abdominal pain ever" or develop nausea or vomiting, please contact the office immediately for further recommendations for treatment.   ITCHING:  If you experience itching with your medications, try taking only a single pain pill, or even half a pain pill at a time.  You can also use Benadryl over the counter for itching or also to help with sleep.   TED HOSE STOCKINGS:  Use stockings on both legs until for at least 2 weeks or as directed by   physician office. They may be removed at night for sleeping.  MEDICATIONS:  See your medication summary on the "After Visit Summary" that nursing will review with you.  You may have some home medications which will be placed on hold until you complete the course of blood thinner medication.  It is important for you to complete the blood thinner medication as prescribed.  PRECAUTIONS:  If you experience chest pain or shortness of breath - call 911 immediately for transfer to the hospital emergency department.   If you develop a fever greater that 101 F, purulent drainage from wound, increased redness or drainage from wound, foul odor from the wound/dressing, or calf pain - CONTACT YOUR SURGEON.                                                   FOLLOW-UP APPOINTMENTS:  If you do not already have a post-op appointment, please call the office for an appointment to be seen by your surgeon.  Guidelines for how soon to be seen are listed in your "After Visit Summary", but are typically between 1-4 weeks after surgery.  OTHER INSTRUCTIONS:    POST-OPERATIVE OPIOID TAPER INSTRUCTIONS: It is important to wean off of your opioid medication as soon as  possible. If you do not need pain medication after your surgery it is ok to stop day one. Opioids include: Codeine, Hydrocodone(Norco, Vicodin), Oxycodone(Percocet, oxycontin) and hydromorphone amongst others.  Long term and even short term use of opiods can cause: Increased pain response Dependence Constipation Depression Respiratory depression And more.  Withdrawal symptoms can include Flu like symptoms Nausea, vomiting And more Techniques to manage these symptoms Hydrate well Eat regular healthy meals Stay active Use relaxation techniques(deep breathing, meditating, yoga) Do Not substitute Alcohol to help with tapering If you have been on opioids for less than two weeks and do not have pain than it is ok to stop all together.  Plan to wean off of opioids This plan should start within one week post op of your joint replacement. Maintain the same interval or time between taking each dose and first decrease the dose.  Cut the total daily intake of opioids by one tablet each day Next start to increase the time between doses. The last dose that should be eliminated is the evening dose.   MAKE SURE YOU:  Understand these instructions.  Get help right away if you are not doing well or get worse.    Thank you for letting us be a part of your medical care team.  It is a privilege we respect greatly.  We hope these instructions will help you stay on track for a fast and full recovery!  _________________________________________________________________  Information on my medicine - ELIQUIS (apixaban)  This medication education was reviewed with me or my healthcare representative as part of my discharge preparation.    Why was Eliquis prescribed for you? Eliquis was prescribed for you to reduce the risk of blood clots forming after orthopedic surgery.    What do You need to know about Eliquis? Take your Eliquis TWICE DAILY - one tablet in the morning and one tablet in the  evening with or without food.  It would be best to take the dose about the same time each day.  If you have difficulty swallowing the tablet whole please discuss with  your pharmacist how to take the medication safely.  Take Eliquis exactly as prescribed by your doctor and DO NOT stop taking Eliquis without talking to the doctor who prescribed the medication.  Stopping without other medication to take the place of Eliquis may increase your risk of developing a clot.  After discharge, you should have regular check-up appointments with your healthcare provider that is prescribing your Eliquis.  What do you do if you miss a dose? If a dose of ELIQUIS is not taken at the scheduled time, take it as soon as possible on the same day and twice-daily administration should be resumed.  The dose should not be doubled to make up for a missed dose.  Do not take more than one tablet of ELIQUIS at the same time.  Important Safety Information A possible side effect of Eliquis is bleeding. You should call your healthcare provider right away if you experience any of the following: Bleeding from an injury or your nose that does not stop. Unusual colored urine (red or dark brown) or unusual colored stools (red or black). Unusual bruising for unknown reasons. A serious fall or if you hit your head (even if there is no bleeding).  Some medicines may interact with Eliquis and might increase your risk of bleeding or clotting while on Eliquis. To help avoid this, consult your healthcare provider or pharmacist prior to using any new prescription or non-prescription medications, including herbals, vitamins, non-steroidal anti-inflammatory drugs (NSAIDs) and supplements.  This website has more information on Eliquis (apixaban): http://www.eliquis.com/eliquis/home

## 2023-01-20 NOTE — Op Note (Signed)
01/20/2023  3:56 PM  PATIENT:  Mary Buck   MRN: [id number]  PRE-OPERATIVE DIAGNOSIS: Left hip primary localized osteoarthritis  POST-OPERATIVE DIAGNOSIS:  same  PROCEDURE:  Procedure(s): TOTAL HIP ARTHROPLASTY  PREOPERATIVE INDICATIONS:    Mary Buck is an 87 y.o. female who has a diagnosis of left hip primary localized osteoarthritis and elected for surgical management after failing conservative treatment.  The risks benefits and alternatives were discussed with the patient including but not limited to the risks of nonoperative treatment, versus surgical intervention including infection, bleeding, nerve injury, periprosthetic fracture, the need for revision surgery, dislocation, leg length discrepancy, blood clots, cardiopulmonary complications, morbidity, mortality, among others, and they were willing to proceed.     OPERATIVE REPORT     SURGEON:  Teryl Lucy, MD    ASSISTANT:  Janine Ores, PA-C, (Present throughout the entire procedure,  necessary for completion of procedure in a timely manner, assisting with retraction, instrumentation, and closure)     ANESTHESIA: General  ESTIMATED BLOOD LOSS: 150 mL    COMPLICATIONS:  None.     UNIQUE ASPECTS OF THE CASE: The femoral neck had decent length.  I cut it just a little bit below the thumbs breaths.  I had excellent exposure on the acetabulum, and I matched what look like anatomically between the anterior posterior and superior walls.  I had a little bit more anterior wall showing and slight beads showing posterior superiorly.  She reamed easily to a size 5, but the 5 was tight proximally, I had about one half tooth showing.  I went with the +1 and had excellent stability, I suspect that she is a little bit longer on the side now, but the other side has advanced wear, and will need to be replaced.  COMPONENTS:  [employer] fit femur size 5 standard offset with a 32 mm + 1 metallic head ball and a Gription  Acetabular shell size 50, with a single cancellous screw for backup fixation, with an apex hole eliminator and a +4 neutral polyethylene liner.    PROCEDURE IN DETAIL:   The patient was met in the holding area and  identified.  The appropriate hip was identified and marked at the operative site.  The patient was then transported to the OR  and  placed under anesthesia.  At that point, the patient was  placed in the lateral decubitus position with the operative side up and  secured to the operating room table and all bony prominences padded.     The operative lower extremity was prepped from the iliac crest to the distal leg.  Sterile draping was performed.  Time out was performed prior to incision.      A routine posterolateral approach was utilized via sharp dissection  carried down to the subcutaneous tissue.  Gross bleeders were Bovie coagulated.  The iliotibial band was identified and incised along the length of the skin incision.  Self-retaining retractors were  inserted.  With the hip internally rotated, the short external rotators  were identified. The piriformis and capsule was tagged with FiberWire, and the hip capsule released in a T-type fashion.  The femoral neck was exposed, and I resected the femoral neck using the appropriate jig. This was performed at approximately a thumb's breadth above the lesser trochanter.    I then exposed the deep acetabulum, cleared out any tissue including the ligamentum teres.  A wing retractor was placed.  After adequate visualization, I excised the labrum,  and then sequentially reamed.  I placed the trial acetabulum, which seated nicely, and then impacted the real cup into place.  Appropriate version and inclination was confirmed clinically matching their bony anatomy, and also with the use of the jig.  I placed a cancellous screw to augment fixation.  A trial polyethylene liner was placed and the wing retractor removed.    I then prepared the proximal  femur using the cookie-cutter, the lateralizing reamer, and then sequentially reamed and broached.  A trial broach, neck, and head was utilized, and I reduced the hip and it was found to have excellent stability with functional range of motion. The trial components were then removed, and the real polyethylene liner was placed.  I then impacted the real femoral prosthesis into place into the appropriate version, slightly anteverted to the normal anatomy, and I impacted the real head ball into place. The hip was then reduced and taken through functional range of motion and found to have excellent stability. Leg lengths were restored.  I then used a 2 mm drill bits to pass the FiberWire suture from the capsule and piriformis through the greater trochanter, and secured this. Excellent posterior capsular repair was achieved. I also closed the T in the capsule.  I then irrigated the hip copiously again with pulse lavage, and repaired the fascia with Vicryl, followed by Vicryl for the subcutaneous tissue, Monocryl for the skin, Steri-Strips and sterile gauze. The wounds were injected. The patient was then awakened and returned to PACU in stable and satisfactory condition. There were no complications.  Teryl Lucy, MD Orthopedic Surgeon [phone]   01/20/2023 3:56 PM

## 2023-01-20 NOTE — Transfer of Care (Signed)
Immediate Anesthesia Transfer of Care Note  Patient: Mary Buck  Procedure(s) Performed: TOTAL HIP ARTHROPLASTY (Left: Hip)  Patient Location: PACU  Anesthesia Type:General  Level of Consciousness: awake  Airway & Oxygen Therapy: Patient Spontanous Breathing and Patient connected to face mask oxygen  Post-op Assessment: Report given to RN and Post -op Vital signs reviewed and stable  Post vital signs: Reviewed and stable  Last Vitals:  Vitals Value Taken Time  BP 159/82 01/20/23 1630  Temp    Pulse 62 01/20/23 1631  Resp 14 01/20/23 1631  SpO2 100 % 01/20/23 1631  Vitals shown include unvalidated device data.  Last Pain:  Vitals:   01/20/23 1114  TempSrc: Oral  PainSc: 0-No pain         Complications: No notable events documented.

## 2023-01-20 NOTE — Interval H&P Note (Signed)
History and Physical Interval Note:  01/20/2023 11:45 AM  Mary Buck  has presented today for surgery, with the diagnosis of left hip DJD.  The various methods of treatment have been discussed with the patient and family. After consideration of risks, benefits and other options for treatment, the patient has consented to  Procedure(s): TOTAL HIP ARTHROPLASTY (Left) as a surgical intervention.  The patient's history has been reviewed, patient examined, no change in status, stable for surgery.  I have reviewed the patient's chart and labs.  Questions were answered to the patient's satisfaction.     Eulas Post

## 2023-01-21 ENCOUNTER — Encounter (HOSPITAL_COMMUNITY): Payer: Self-pay | Admitting: Orthopedic Surgery

## 2023-01-21 DIAGNOSIS — J45909 Unspecified asthma, uncomplicated: Secondary | ICD-10-CM | POA: Diagnosis not present

## 2023-01-21 DIAGNOSIS — Z85828 Personal history of other malignant neoplasm of skin: Secondary | ICD-10-CM | POA: Diagnosis not present

## 2023-01-21 DIAGNOSIS — M1612 Unilateral primary osteoarthritis, left hip: Secondary | ICD-10-CM | POA: Diagnosis not present

## 2023-01-21 DIAGNOSIS — Z8551 Personal history of malignant neoplasm of bladder: Secondary | ICD-10-CM | POA: Diagnosis not present

## 2023-01-21 DIAGNOSIS — Z91040 Latex allergy status: Secondary | ICD-10-CM | POA: Diagnosis not present

## 2023-01-21 DIAGNOSIS — E039 Hypothyroidism, unspecified: Secondary | ICD-10-CM | POA: Diagnosis not present

## 2023-01-21 LAB — CBC
HCT: 34.8 % — ABNORMAL LOW (ref 36.0–46.0)
Hemoglobin: 11.3 g/dL — ABNORMAL LOW (ref 12.0–15.0)
MCH: 32.3 pg (ref 26.0–34.0)
MCHC: 32.5 g/dL (ref 30.0–36.0)
MCV: 99.4 fL (ref 80.0–100.0)
Platelets: 192 10*3/uL (ref 150–400)
RBC: 3.5 MIL/uL — ABNORMAL LOW (ref 3.87–5.11)
RDW: 13.3 % (ref 11.5–15.5)
WBC: 7.6 10*3/uL (ref 4.0–10.5)
nRBC: 0 % (ref 0.0–0.2)

## 2023-01-21 LAB — BASIC METABOLIC PANEL
Anion gap: 5 (ref 5–15)
BUN: 13 mg/dL (ref 8–23)
CO2: 27 mmol/L (ref 22–32)
Calcium: 8.1 mg/dL — ABNORMAL LOW (ref 8.9–10.3)
Chloride: 105 mmol/L (ref 98–111)
Creatinine, Ser: 0.91 mg/dL (ref 0.44–1.00)
GFR, Estimated: >60 mL/min (ref >60)
Glucose, Bld: 176 mg/dL — ABNORMAL HIGH (ref 70–99)
Potassium: 4.3 mmol/L (ref 3.5–5.1)
Sodium: 137 mmol/L (ref 135–145)

## 2023-01-21 NOTE — Discharge Summary (Signed)
Physician Discharge Summary  Patient ID: Mary Buck MRN: [id number] DOB/AGE: August 17, 1936 87 y.o.  Admit date: 01/20/2023 Discharge date: 01/21/2023  Admission Diagnoses:  S/P hip replacement, left  Discharge Diagnoses:  Principal Problem:   S/P hip replacement, left   Past Medical History:  Diagnosis Date   Allergy    Asthma    as teen   Cataract    DDD (degenerative disc disease), lumbar 06/01/2017   Depression    GERD (gastroesophageal reflux disease)    Heart murmur    no issues   Hyperlipidemia    Hypothyroidism    IDA (iron deficiency anemia) 05/31/2020   Peripheral neuropathy 04/30/2017   Skin cancer    left leg had radiation, bladder cancer   Sleep apnea    no CPAP   Thyroid disease     Surgeries: Procedure(s): TOTAL HIP ARTHROPLASTY on 01/20/2023   Consultants (if any):   Discharged Condition: Improved  Hospital Course: Mary Buck is an 87 y.o. female who was admitted 01/20/2023 with a diagnosis of S/P hip replacement, left and went to the operating room on 01/20/2023 and underwent the above named procedures.    She was given perioperative antibiotics:  Anti-infectives (From admission, onward)    Start     Dose/Rate Route Frequency Ordered Stop   01/20/23 2030  ceFAZolin (ANCEF) IVPB 1 g/50 mL premix        1 g 100 mL/hr over 30 Minutes Intravenous Every 6 hours 01/20/23 1836 01/21/23 0325   01/20/23 1130  ceFAZolin (ANCEF) IVPB 2g/100 mL premix        2 g 200 mL/hr over 30 Minutes Intravenous 30 min pre-op 01/20/23 1034 01/20/23 1430     .  She was given sequential compression devices, early ambulation, and Eliquis for DVT prophylaxis.  She benefited maximally from the hospital stay and there were no complications.    Recent vital signs:  Vitals:   01/21/23 1041 01/21/23 1359  BP: 127/64 (!) 146/66  Pulse: 73 78  Resp: 16 18  Temp: 98.4 F (36.9 C) 99 F (37.2 C)  SpO2: 93% 98%    Recent laboratory studies:  Lab Results   Component Value Date   HGB 11.3 (L) 01/21/2023   HGB 12.6 01/08/2023   HGB 12.4 10/29/2022   Lab Results  Component Value Date   WBC 7.6 01/21/2023   PLT 192 01/21/2023   No results found for: "INR" Lab Results  Component Value Date   NA 137 01/21/2023   K 4.3 01/21/2023   CL 105 01/21/2023   CO2 27 01/21/2023   BUN 13 01/21/2023   CREATININE 0.91 01/21/2023   GLUCOSE 176 (H) 01/21/2023    Discharge Medications:   Allergies as of 01/21/2023       Reactions   Contrast Media [iodinated Contrast Media] Anaphylaxis   Oxycodone Anaphylaxis   "my throat swells with any opioid" (tolerated hydrocodone 01/21/23)   Latex Other (See Comments)   Other reaction(s): blistering, redness    Mirtazapine Nausea Only   Acitretin Rash   Cephalexin Other (See Comments)   Unknown (tolerated Ancef 01/21/23)   Elemental Sulfur Other (See Comments)   unknown   Penicillins Other (See Comments)   Unknown   Sulfa Antibiotics Other (See Comments)   Unknown        Medication List     STOP taking these medications    acetaminophen 325 MG tablet Commonly known as: TYLENOL   traMADol 50 MG tablet Commonly  known as: ULTRAM       TAKE these medications    apixaban 2.5 MG Tabs tablet Commonly known as: Eliquis Take 1 tablet (2.5 mg total) by mouth 2 (two) times daily.   famotidine 20 MG tablet Commonly known as: PEPCID TAKE 1 TABLET(20 MG) BY MOUTH AT BEDTIME   gabapentin 300 MG capsule Commonly known as: NEURONTIN Take 1 capsule (300 mg total) by mouth 3 (three) times daily. What changed: when to take this   HYDROcodone-acetaminophen 10-325 MG tablet Commonly known as: Norco Take 0.5-1 tablets by mouth every 6 (six) hours as needed for up to 7 days.   lansoprazole 30 MG capsule Commonly known as: PREVACID TAKE 1 CAPSULE(30 MG) BY MOUTH TWICE DAILY BEFORE A MEAL   levothyroxine 88 MCG tablet Commonly known as: SYNTHROID Take 1 tablet (88 mcg total) by mouth daily before  breakfast.   misoprostol 100 MCG tablet Commonly known as: CYTOTEC TAKE 2 TABLETS BY MOUTH AFTER BREAKFAST AND AFTER DINNER AS DIRECTED   nystatin powder Commonly known as: MYCOSTATIN/NYSTOP Apply 1 Application topically 3 (three) times daily. What changed: when to take this   ondansetron 4 MG disintegrating tablet Commonly known as: ZOFRAN-ODT Take 1 tablet (4 mg total) by mouth every 8 (eight) hours as needed for nausea or vomiting.   Refresh Liquigel 1 % Gel Generic drug: Carboxymethylcellulose Sodium Place 1 drop into both eyes at bedtime.   Lubricant Eye Drops PF 0.5 % Soln Generic drug: Carboxymethylcellulose Sod PF Place 1 drop into both eyes 4 (four) times daily as needed (dry eyes).   RISAQUAD PO Take 1 capsule by mouth daily.   sennosides-docusate sodium 8.6-50 MG tablet Commonly known as: SENOKOT-S Take 2 tablets by mouth daily. While taking pain medication (for constipation caused by pain medication)   simvastatin 20 MG tablet Commonly known as: ZOCOR TAKE 1 TABLET(20 MG) BY MOUTH DAILY   sodium chloride 5 % ophthalmic solution Commonly known as: MURO 128 Place 1 drop into both eyes at bedtime.   VITAMIN K2-VITAMIN D3 PO Take 1 tablet by mouth daily.        Diagnostic Studies: DG Hip Port Unilat With Pelvis 1V Left  Result Date: 01/20/2023 CLINICAL DATA:  Postop left hip surgery. EXAM: DG HIP (WITH OR WITHOUT PELVIS) 1V PORT LEFT COMPARISON:  None Available. FINDINGS: Left hip arthroplasty in expected alignment. No periprosthetic lucency or fracture. Recent postsurgical change includes air and edema in the soft tissues. IMPRESSION: Left hip arthroplasty without immediate postoperative complication. Electronically Signed   By: Narda Rutherford M.D.   On: 01/20/2023 17:19    Disposition: Discharge disposition: 06-Home-Health Care Svc          Follow-up Information     Teryl Lucy, MD. Go on 02/02/2023.   Specialty: Orthopedic Surgery Why: your  appointment is scheduled for 11:15. Contact information: [address] [place] [address] [phone]         [facility] Follow up.   Specialty: Home Health Services Why: HHPT will provide 6 home visits prior to starting outpatient physical therapy Contact information: [address] [place] [address] [phone]         [facility] [place]. Go on 02/03/2023.   Why: they will call you with appointment time Contact information: [phone]                 Signed: Armida Sans 01/21/2023, 2:51 PM

## 2023-01-21 NOTE — Anesthesia Postprocedure Evaluation (Signed)
Anesthesia Post Note  Patient: Mary Buck  Procedure(s) Performed: TOTAL HIP ARTHROPLASTY (Left: Hip)     Patient location during evaluation: PACU Anesthesia Type: General Level of consciousness: awake and alert, oriented and patient cooperative Pain management: pain level controlled Vital Signs Assessment: post-procedure vital signs reviewed and stable Respiratory status: spontaneous breathing, nonlabored ventilation and respiratory function stable Cardiovascular status: blood pressure returned to baseline and stable Postop Assessment: no apparent nausea or vomiting Anesthetic complications: no   No notable events documented.  Last Vitals:  Vitals:   01/21/23 0558 01/21/23 1041  BP: (!) 143/68 127/64  Pulse: 74 73  Resp: 18 16  Temp: 36.4 C 36.9 C  SpO2: 99% 93%    Last Pain:  Vitals:   01/21/23 1041  TempSrc: Oral  PainSc:                  Lannie Fields

## 2023-01-21 NOTE — TOC Transition Note (Signed)
Transition of Care [facility]) - CM/SW Discharge Note  Patient Details  Name: Mary Buck MRN: [id number] Date of Birth: Jul 16, 1936  Transition of Care [facility]) CM/SW Contact:  Ewing Schlein, LCSW Phone Number: 01/21/2023, 10:58 AM  Clinical Narrative: Patient is expected to discharge home after working with PT. CSW met with patient to review discharge plan and needs. Patient will go home with HHPT, which was prearranged with [facility]. Patient will need a rolling walker, which [employer] delivered to patient's room. TOC signing off.    Final next level of care: Home w Home Health Services Barriers to Discharge: No Barriers Identified  Patient Goals and CMS Choice CMS Medicare.gov Compare Post Acute Care list provided to:: Patient Choice offered to / list presented to : Patient  Discharge Plan and Services Additional resources added to the After Visit Summary for        DME Arranged: Walker rolling DME Agency: [employer] Representative spoke with at DME Agency: Prearranged in orthopedist's office HH Arranged: PT HH Agency: Optometrist spoke with at [facility] Agency: Prearranged in orthopedist's office  Social Determinants of Health (SDOH) Interventions SDOH Screenings   Food Insecurity: No Food Insecurity (01/20/2023)  Housing: Low Risk  (01/20/2023)  Transportation Needs: No Transportation Needs (01/20/2023)  Utilities: Not At Risk (01/20/2023)  Alcohol Screen: Low Risk  (11/13/2022)  Depression (PHQ2-9): Low Risk  (11/13/2022)  Financial Resource Strain: Low Risk  (07/30/2021)  Physical Activity: Insufficiently Active (07/30/2021)  Social Connections: Moderately Isolated (07/30/2021)  Stress: Stress Concern Present (07/30/2021)  Tobacco Use: Medium Risk (01/20/2023)   Readmission Risk Interventions     No data to display

## 2023-01-21 NOTE — Plan of Care (Signed)
  Problem: Activity: Goal: Ability to tolerate increased activity will improve Outcome: Progressing   Problem: Pain Management: Goal: Pain level will decrease with appropriate interventions Outcome: Progressing   Problem: Coping: Goal: Level of anxiety will decrease Outcome: Progressing   Problem: Safety: Goal: Ability to remain free from injury will improve Outcome: Progressing   

## 2023-01-21 NOTE — Progress Notes (Signed)
Physical Therapy Treatment Patient Details Name: Mary Buck MRN: [id number] DOB: 01/24/1936 Today's Date: 01/21/2023   History of Present Illness 87 yo female s/p L posterior THA 01/20/23. Hx of neuropathy, memory deficits, back pain    PT Comments    Progressing well. Reviewed/practiced gait and stair training with patient and partner. Encouraged pt to ambulate often at home, as tolerated. Instructed them to wait on showering until HHPT can assess home setup and provide instructions/practice with them. All PT education completed.     Recommendations for follow up therapy are one component of a multi-disciplinary discharge planning process, led by the attending physician.  Recommendations may be updated based on patient status, additional functional criteria and insurance authorization.  Follow Up Recommendations       Assistance Recommended at Discharge Frequent or constant Supervision/Assistance  Patient can return home with the following A little help with walking and/or transfers;A little help with bathing/dressing/bathroom;Assistance with cooking/housework;Assist for transportation;Help with stairs or ramp for entrance   Equipment Recommendations  Rolling walker (2 wheels)    Recommendations for Other Services       Precautions / Restrictions Precautions Precautions: Fall Precaution Comments: no hip precautions Restrictions Weight Bearing Restrictions: No Other Position/Activity Restrictions: WBAT     Mobility  Bed Mobility Overal bed mobility: Needs Assistance Bed Mobility: Supine to Sit     Supine to sit: Supervision, HOB elevated     General bed mobility comments: Supv for safety. Cues provided. Increased time. Pt used gait belt as leg lifter.    Transfers Overall transfer level: Needs assistance Equipment used: Rolling walker (2 wheels) Transfers: Sit to/from Stand Sit to Stand: Min guard           General transfer comment: Min guard for safety.  Cues for safety, hand placement.    Ambulation/Gait Ambulation/Gait assistance: Min guard Gait Distance (Feet): 60 Feet Assistive device: Rolling walker (2 wheels) Gait Pattern/deviations: Step-to pattern       General Gait Details: Cues for safety, posture, proper step length, RW proximity. Min guard A for safety. Pt denied dizziness.   Stairs Stairs: Yes Stairs assistance: Min assist Stair Management: Step to pattern, Forwards, With walker Number of Stairs: 2 General stair comments: up and over portable stairs x 2. cues for safety, technique, sequencing. Partner present to observe and practice.   Wheelchair Mobility    Modified Rankin (Stroke Patients Only)       Balance Overall balance assessment: Needs assistance Sitting-balance support: Feet supported Sitting balance-Leahy Scale: Good     Standing balance support: Reliant on assistive device for balance, During functional activity Standing balance-Leahy Scale: Fair                              Cognition Arousal/Alertness: Awake/alert Behavior During Therapy: WFL for tasks assessed/performed Overall Cognitive Status: Within Functional Limits for tasks assessed                                 General Comments: memory deficits per chart        Exercises Total Joint Exercises Ankle Circles/Pumps: AROM, Both, 10 reps Quad Sets: AROM, Both, 10 reps Heel Slides: AROM, Left, 10 reps Hip ABduction/ADduction: AROM, Left, 10 reps    General Comments        Pertinent Vitals/Pain Pain Assessment Pain Assessment: 0-10 Pain Score: 4  Pain Location: L  hip/LE Pain Descriptors / Indicators: Aching, Discomfort Pain Intervention(s): Monitored during session, Limited activity within patient's tolerance    Home Living Family/patient expects to be discharged to:: Private residence Living Arrangements: Spouse/significant other Available Help at Discharge: Family Type of Home: House Home  Access: Stairs to enter   [employer] of Steps: (garage-no rails) 2; (other entrance-L rail) 3   Home Layout: One level Home Equipment: None      Prior Function            PT Goals (current goals can now be found in the care plan section) Acute Rehab PT Goals Patient Stated Goal: home asap PT Goal Formulation: With patient/family Time For Goal Achievement: 02/04/23 Potential to Achieve Goals: Good Progress towards PT goals: Progressing toward goals    Frequency    Min 1X/week      PT Plan Current plan remains appropriate    Co-evaluation              AM-PAC PT "6 Clicks" Mobility   Outcome Measure  Help needed turning from your back to your side while in a flat bed without using bedrails?: A Little Help needed moving from lying on your back to sitting on the side of a flat bed without using bedrails?: A Little Help needed moving to and from a bed to a chair (including a wheelchair)?: A Little Help needed standing up from a chair using your arms (e.g., wheelchair or bedside chair)?: A Little Help needed to walk in hospital room?: A Little Help needed climbing 3-5 steps with a railing? : A Little 6 Click Score: 18    End of Session Equipment Utilized During Treatment: Gait belt Activity Tolerance: Patient tolerated treatment well Patient left: in bed;with call bell/phone within reach;with family/visitor present (sitting EOB about to get dressed with partner in room and awaiting NT for assist with compression stockings)   PT Visit Diagnosis: Pain;Other abnormalities of gait and mobility (R26.89) Pain - Right/Left: Left Pain - part of body: Leg;Hip     Time: 1610-9604 PT Time Calculation (min) (ACUTE ONLY): 14 min  Charges:  $Gait Training: 8-22 mins                        Faye Ramsay, PT Acute Rehabilitation  Office: [phone]

## 2023-01-21 NOTE — Evaluation (Signed)
Physical Therapy Evaluation Patient Details Name: Mary Buck MRN: [id number] DOB: March 02, 1936 Today's Date: 01/21/2023  History of Present Illness  87 yo female s/p L posterior THA 01/20/23. Hx of neuropathy, memory deficits, back pain  Clinical Impression  On eval, pt required Min A for mobility. She walked ~100 feet with a RW. Pain rated 4/10 with activity. Will plan to have a 2nd session to continue gait and stair training. If pt meets her PT goals, she is hopeful to d/c home on today.        Recommendations for follow up therapy are one component of a multi-disciplinary discharge planning process, led by the attending physician.  Recommendations may be updated based on patient status, additional functional criteria and insurance authorization.  Follow Up Recommendations       Assistance Recommended at Discharge Frequent or constant Supervision/Assistance  Patient can return home with the following  A little help with walking and/or transfers;A little help with bathing/dressing/bathroom;Assistance with cooking/housework;Assist for transportation;Help with stairs or ramp for entrance    Equipment Recommendations Rolling walker (2 wheels)  Recommendations for Other Services       Functional Status Assessment Patient has had a recent decline in their functional status and demonstrates the ability to make significant improvements in function in a reasonable and predictable amount of time.     Precautions / Restrictions Precautions Precautions: Fall Precaution Comments: no hip precautions Restrictions Weight Bearing Restrictions: No Other Position/Activity Restrictions: WBAT      Mobility  Bed Mobility Overal bed mobility: Needs Assistance Bed Mobility: Supine to Sit     Supine to sit: Min guard, HOB elevated     General bed mobility comments: Min guard for safety. Cues provided. Increased time. Pt used gait belt as leg lifter.    Transfers Overall transfer level:  Needs assistance Equipment used: Rolling walker (2 wheels) Transfers: Sit to/from Stand Sit to Stand: Min assist           General transfer comment: Assist to rise, steady, control descent. Cues for safety, technique, hand placement. Increased time.    Ambulation/Gait Ambulation/Gait assistance: Min assist Gait Distance (Feet): 100 Feet Assistive device: Rolling walker (2 wheels) Gait Pattern/deviations: Step-to pattern       General Gait Details: Cues for safety, posture, proper step length, RW proximity. Assist to stabilize throghout distance. Followed with recliner for safety. Pt denied dizziness. Fatigued towards end of distance.  Stairs            Wheelchair Mobility    Modified Rankin (Stroke Patients Only)       Balance Overall balance assessment: Needs assistance Sitting-balance support: Feet supported Sitting balance-Leahy Scale: Good     Standing balance support: During functional activity, Reliant on assistive device for balance Standing balance-Leahy Scale: Fair                               Pertinent Vitals/Pain Pain Assessment Pain Assessment: 0-10 Pain Score: 4  Pain Location: L hip/LE Pain Descriptors / Indicators: Aching, Discomfort Pain Intervention(s): Limited activity within patient's tolerance, Monitored during session, Repositioned, Ice applied    Home Living Family/patient expects to be discharged to:: Private residence Living Arrangements: Spouse/significant other Available Help at Discharge: Family Type of Home: House Home Access: Stairs to enter   [employer] of Steps: (garage-no rails) 2; (other entrance-L rail) 3   Home Layout: One level Home Equipment: None  Prior Function Prior Level of Function : Independent/Modified Independent                     Hand Dominance        Extremity/Trunk Assessment   Upper Extremity Assessment Upper Extremity Assessment: Generalized weakness     Lower Extremity Assessment Lower Extremity Assessment: Generalized weakness    Cervical / Trunk Assessment Cervical / Trunk Assessment: Kyphotic  Communication   Communication: HOH  Cognition Arousal/Alertness: Awake/alert Behavior During Therapy: WFL for tasks assessed/performed Overall Cognitive Status: Within Functional Limits for tasks assessed                                 General Comments: memory deficits per chart        General Comments      Exercises Total Joint Exercises Ankle Circles/Pumps: AROM, Both, 10 reps Quad Sets: AROM, Both, 10 reps Heel Slides: AROM, Left, 10 reps Hip ABduction/ADduction: AROM, Left, 10 reps   Assessment/Plan    PT Assessment Patient needs continued PT services  PT Problem List Decreased strength;Decreased range of motion;Decreased activity tolerance;Decreased balance;Decreased mobility;Pain;Decreased knowledge of use of DME       PT Treatment Interventions DME instruction;Gait training;Stair training;Functional mobility training;Therapeutic activities;Balance training;Therapeutic exercise;Patient/family education    PT Goals (Current goals can be found in the Care Plan section)  Acute Rehab PT Goals Patient Stated Goal: home asap PT Goal Formulation: With patient/family Time For Goal Achievement: 02/04/23 Potential to Achieve Goals: Good    Frequency Min 1X/week     Co-evaluation               AM-PAC PT "6 Clicks" Mobility  Outcome Measure Help needed turning from your back to your side while in a flat bed without using bedrails?: A Little Help needed moving from lying on your back to sitting on the side of a flat bed without using bedrails?: A Little Help needed moving to and from a bed to a chair (including a wheelchair)?: A Little Help needed standing up from a chair using your arms (e.g., wheelchair or bedside chair)?: A Little Help needed to walk in hospital room?: A Little Help needed  climbing 3-5 steps with a railing? : A Little 6 Click Score: 18    End of Session Equipment Utilized During Treatment: Gait belt Activity Tolerance: Patient tolerated treatment well Patient left: in chair;with call bell/phone within reach;with family/visitor present   PT Visit Diagnosis: Pain;Other abnormalities of gait and mobility (R26.89) Pain - Right/Left: Left Pain - part of body: Leg;Hip    Time: 1010-1033 PT Time Calculation (min) (ACUTE ONLY): 23 min   Charges:   PT Evaluation $PT Eval Low Complexity: 1 Low PT Treatments $Gait Training: 8-22 mins           Faye Ramsay, PT Acute Rehabilitation  Office: [phone]

## 2023-01-21 NOTE — Progress Notes (Signed)
Subjective: 1 Day Post-Op s/p Procedure(s): TOTAL HIP ARTHROPLASTY   Patient is alert, oriented, however status she doesn't feel like herself, feels slow this mroning. Better is well controlled this morning. She is upset as she often takes her morning meds/breakfast with cold items like cold water and cold yogurt and has been unable to do so this morning. She has voided on bedpan this morning but has not yet gotten up to toilet. Some throat pain after intubation. Denies chest pain, SOB, Calf pain. No nausea/vomiting.    Objective:  PE: VITALS:   Vitals:   01/20/23 1836 01/20/23 2142 01/21/23 0106 01/21/23 0558  BP: (!) 169/75 (!) 150/81 (!) 144/78 (!) 143/68  Pulse: 66 80 83 74  Resp: 18 18 18 18   Temp: 98 F (36.7 C) (!) 97.5 F (36.4 C) 98.3 F (36.8 C) 97.6 F (36.4 C)  TempSrc: Oral Oral Oral Oral  SpO2: 100% 100% 99% 99%  Weight:      Height:        ABD soft Sensation intact distally Intact pulses distally Dorsiflexion/Plantar flexion intact Incision: dressing C/D/I  LABS  Results for orders placed or performed during the hospital encounter of 01/20/23 (from the past 24 hour(s))  CBC     Status: Abnormal   Collection Time: 01/21/23  3:41 AM  Result Value Ref Range   WBC 7.6 4.0 - 10.5 K/uL   RBC 3.50 (L) 3.87 - 5.11 MIL/uL   Hemoglobin 11.3 (L) 12.0 - 15.0 g/dL   HCT 29.5 (L) 62.1 - 30.8 %   MCV 99.4 80.0 - 100.0 fL   MCH 32.3 26.0 - 34.0 pg   MCHC 32.5 30.0 - 36.0 g/dL   RDW 65.7 84.6 - 96.2 %   Platelets 192 150 - 400 K/uL   nRBC 0.0 0.0 - 0.2 %  Basic metabolic panel     Status: Abnormal   Collection Time: 01/21/23  3:41 AM  Result Value Ref Range   Sodium 137 135 - 145 mmol/L   Potassium 4.3 3.5 - 5.1 mmol/L   Chloride 105 98 - 111 mmol/L   CO2 27 22 - 32 mmol/L   Glucose, Bld 176 (H) 70 - 99 mg/dL   BUN 13 8 - 23 mg/dL   Creatinine, Ser 9.52 0.44 - 1.00 mg/dL   Calcium 8.1 (L) 8.9 - 10.3 mg/dL   GFR, Estimated >84 >13 mL/min   Anion gap 5  5 - 15    DG Hip Port Unilat With Pelvis 1V Left  Result Date: 01/20/2023 CLINICAL DATA:  Postop left hip surgery. EXAM: DG HIP (WITH OR WITHOUT PELVIS) 1V PORT LEFT COMPARISON:  None Available. FINDINGS: Left hip arthroplasty in expected alignment. No periprosthetic lucency or fracture. Recent postsurgical change includes air and edema in the soft tissues. IMPRESSION: Left hip arthroplasty without immediate postoperative complication. Electronically Signed   By: Narda Rutherford M.D.   On: 01/20/2023 17:19    Assessment/Plan: Principal Problem:   S/P hip replacement, left  1 Day Post-Op s/p Procedure(s): TOTAL HIP ARTHROPLASTY - I called patient's partner Mickie on room phone and Sanja has spoken with her. I encouraged her to ask Mickie to come stay in the hospital with her today in order for her to be present during physical therapy visits today/to have a friendly face for support as she recovers today. Waive has been surprised at her recovery so far, feels like this is harder than she expects this to be. She is  adamant about going home today.  Advance Diet Incentive Spirometry Weightbearing: WBAT LLE, up with therapy Insicional and dressing care: Reinforce dressings as needed VTE prophylaxis: Eliquis 2.5 mg BID to start today Pain control: continue current regimen Follow - up plan: 2 weeks with Dr. Dion Saucier Dispo: pending progress with PT today, ideally discharging home later this afternoon   Contact information:   Janine Ores, PA-C Weekdays 8-5  After hours and holidays please check [email or web address] for group call information for [facility]  Armida Sans 01/21/2023, 9:16 AM

## 2023-01-23 DIAGNOSIS — M5136 Other intervertebral disc degeneration, lumbar region: Secondary | ICD-10-CM | POA: Diagnosis not present

## 2023-01-23 DIAGNOSIS — I10 Essential (primary) hypertension: Secondary | ICD-10-CM | POA: Diagnosis not present

## 2023-01-23 DIAGNOSIS — Z85828 Personal history of other malignant neoplasm of skin: Secondary | ICD-10-CM | POA: Diagnosis not present

## 2023-01-23 DIAGNOSIS — M722 Plantar fascial fibromatosis: Secondary | ICD-10-CM | POA: Diagnosis not present

## 2023-01-23 DIAGNOSIS — F32A Depression, unspecified: Secondary | ICD-10-CM | POA: Diagnosis not present

## 2023-01-23 DIAGNOSIS — Z96642 Presence of left artificial hip joint: Secondary | ICD-10-CM | POA: Diagnosis not present

## 2023-01-23 DIAGNOSIS — Z87891 Personal history of nicotine dependence: Secondary | ICD-10-CM | POA: Diagnosis not present

## 2023-01-23 DIAGNOSIS — K219 Gastro-esophageal reflux disease without esophagitis: Secondary | ICD-10-CM | POA: Diagnosis not present

## 2023-01-23 DIAGNOSIS — Z471 Aftercare following joint replacement surgery: Secondary | ICD-10-CM | POA: Diagnosis not present

## 2023-01-23 DIAGNOSIS — E039 Hypothyroidism, unspecified: Secondary | ICD-10-CM | POA: Diagnosis not present

## 2023-01-23 DIAGNOSIS — G4730 Sleep apnea, unspecified: Secondary | ICD-10-CM | POA: Diagnosis not present

## 2023-01-23 DIAGNOSIS — M47816 Spondylosis without myelopathy or radiculopathy, lumbar region: Secondary | ICD-10-CM | POA: Diagnosis not present

## 2023-01-23 DIAGNOSIS — J45909 Unspecified asthma, uncomplicated: Secondary | ICD-10-CM | POA: Diagnosis not present

## 2023-01-23 DIAGNOSIS — Z8551 Personal history of malignant neoplasm of bladder: Secondary | ICD-10-CM | POA: Diagnosis not present

## 2023-01-23 DIAGNOSIS — E785 Hyperlipidemia, unspecified: Secondary | ICD-10-CM | POA: Diagnosis not present

## 2023-01-23 DIAGNOSIS — M4802 Spinal stenosis, cervical region: Secondary | ICD-10-CM | POA: Diagnosis not present

## 2023-01-23 DIAGNOSIS — M19042 Primary osteoarthritis, left hand: Secondary | ICD-10-CM | POA: Diagnosis not present

## 2023-01-23 DIAGNOSIS — R911 Solitary pulmonary nodule: Secondary | ICD-10-CM | POA: Diagnosis not present

## 2023-01-23 DIAGNOSIS — M48062 Spinal stenosis, lumbar region with neurogenic claudication: Secondary | ICD-10-CM | POA: Diagnosis not present

## 2023-01-23 DIAGNOSIS — D509 Iron deficiency anemia, unspecified: Secondary | ICD-10-CM | POA: Diagnosis not present

## 2023-01-23 DIAGNOSIS — M8580 Other specified disorders of bone density and structure, unspecified site: Secondary | ICD-10-CM | POA: Diagnosis not present

## 2023-01-23 DIAGNOSIS — K5900 Constipation, unspecified: Secondary | ICD-10-CM | POA: Diagnosis not present

## 2023-01-23 DIAGNOSIS — G629 Polyneuropathy, unspecified: Secondary | ICD-10-CM | POA: Diagnosis not present

## 2023-01-26 DIAGNOSIS — M47816 Spondylosis without myelopathy or radiculopathy, lumbar region: Secondary | ICD-10-CM | POA: Diagnosis not present

## 2023-01-26 DIAGNOSIS — I10 Essential (primary) hypertension: Secondary | ICD-10-CM | POA: Diagnosis not present

## 2023-01-26 DIAGNOSIS — Z471 Aftercare following joint replacement surgery: Secondary | ICD-10-CM | POA: Diagnosis not present

## 2023-01-26 DIAGNOSIS — M48062 Spinal stenosis, lumbar region with neurogenic claudication: Secondary | ICD-10-CM | POA: Diagnosis not present

## 2023-01-26 DIAGNOSIS — M5136 Other intervertebral disc degeneration, lumbar region: Secondary | ICD-10-CM | POA: Diagnosis not present

## 2023-01-26 DIAGNOSIS — Z96642 Presence of left artificial hip joint: Secondary | ICD-10-CM | POA: Diagnosis not present

## 2023-01-28 DIAGNOSIS — M48062 Spinal stenosis, lumbar region with neurogenic claudication: Secondary | ICD-10-CM | POA: Diagnosis not present

## 2023-01-28 DIAGNOSIS — I10 Essential (primary) hypertension: Secondary | ICD-10-CM | POA: Diagnosis not present

## 2023-01-28 DIAGNOSIS — Z96642 Presence of left artificial hip joint: Secondary | ICD-10-CM | POA: Diagnosis not present

## 2023-01-28 DIAGNOSIS — Z471 Aftercare following joint replacement surgery: Secondary | ICD-10-CM | POA: Diagnosis not present

## 2023-01-28 DIAGNOSIS — M5136 Other intervertebral disc degeneration, lumbar region: Secondary | ICD-10-CM | POA: Diagnosis not present

## 2023-01-28 DIAGNOSIS — M47816 Spondylosis without myelopathy or radiculopathy, lumbar region: Secondary | ICD-10-CM | POA: Diagnosis not present

## 2023-01-29 DIAGNOSIS — L03116 Cellulitis of left lower limb: Secondary | ICD-10-CM | POA: Diagnosis not present

## 2023-01-30 DIAGNOSIS — I10 Essential (primary) hypertension: Secondary | ICD-10-CM | POA: Diagnosis not present

## 2023-01-30 DIAGNOSIS — M5136 Other intervertebral disc degeneration, lumbar region: Secondary | ICD-10-CM | POA: Diagnosis not present

## 2023-01-30 DIAGNOSIS — Z96642 Presence of left artificial hip joint: Secondary | ICD-10-CM | POA: Diagnosis not present

## 2023-01-30 DIAGNOSIS — Z471 Aftercare following joint replacement surgery: Secondary | ICD-10-CM | POA: Diagnosis not present

## 2023-01-30 DIAGNOSIS — M47816 Spondylosis without myelopathy or radiculopathy, lumbar region: Secondary | ICD-10-CM | POA: Diagnosis not present

## 2023-01-30 DIAGNOSIS — M1612 Unilateral primary osteoarthritis, left hip: Secondary | ICD-10-CM | POA: Diagnosis not present

## 2023-01-30 DIAGNOSIS — M48062 Spinal stenosis, lumbar region with neurogenic claudication: Secondary | ICD-10-CM | POA: Diagnosis not present

## 2023-02-02 DIAGNOSIS — Z96642 Presence of left artificial hip joint: Secondary | ICD-10-CM | POA: Diagnosis not present

## 2023-02-02 DIAGNOSIS — I10 Essential (primary) hypertension: Secondary | ICD-10-CM | POA: Diagnosis not present

## 2023-02-02 DIAGNOSIS — M5136 Other intervertebral disc degeneration, lumbar region: Secondary | ICD-10-CM | POA: Diagnosis not present

## 2023-02-02 DIAGNOSIS — M48062 Spinal stenosis, lumbar region with neurogenic claudication: Secondary | ICD-10-CM | POA: Diagnosis not present

## 2023-02-02 DIAGNOSIS — M47816 Spondylosis without myelopathy or radiculopathy, lumbar region: Secondary | ICD-10-CM | POA: Diagnosis not present

## 2023-02-02 DIAGNOSIS — M1612 Unilateral primary osteoarthritis, left hip: Secondary | ICD-10-CM | POA: Diagnosis not present

## 2023-02-02 DIAGNOSIS — Z471 Aftercare following joint replacement surgery: Secondary | ICD-10-CM | POA: Diagnosis not present

## 2023-02-03 DIAGNOSIS — Z471 Aftercare following joint replacement surgery: Secondary | ICD-10-CM | POA: Diagnosis not present

## 2023-02-03 DIAGNOSIS — M79605 Pain in left leg: Secondary | ICD-10-CM | POA: Diagnosis not present

## 2023-02-03 DIAGNOSIS — Z96642 Presence of left artificial hip joint: Secondary | ICD-10-CM | POA: Diagnosis not present

## 2023-02-03 DIAGNOSIS — I890 Lymphedema, not elsewhere classified: Secondary | ICD-10-CM | POA: Diagnosis not present

## 2023-02-05 DIAGNOSIS — I890 Lymphedema, not elsewhere classified: Secondary | ICD-10-CM | POA: Diagnosis not present

## 2023-02-05 DIAGNOSIS — Z96642 Presence of left artificial hip joint: Secondary | ICD-10-CM | POA: Diagnosis not present

## 2023-02-05 DIAGNOSIS — M79605 Pain in left leg: Secondary | ICD-10-CM | POA: Diagnosis not present

## 2023-02-05 DIAGNOSIS — Z471 Aftercare following joint replacement surgery: Secondary | ICD-10-CM | POA: Diagnosis not present

## 2023-02-10 DIAGNOSIS — Z471 Aftercare following joint replacement surgery: Secondary | ICD-10-CM | POA: Diagnosis not present

## 2023-02-10 DIAGNOSIS — M79605 Pain in left leg: Secondary | ICD-10-CM | POA: Diagnosis not present

## 2023-02-10 DIAGNOSIS — Z96642 Presence of left artificial hip joint: Secondary | ICD-10-CM | POA: Diagnosis not present

## 2023-02-10 DIAGNOSIS — I890 Lymphedema, not elsewhere classified: Secondary | ICD-10-CM | POA: Diagnosis not present

## 2023-02-13 DIAGNOSIS — I890 Lymphedema, not elsewhere classified: Secondary | ICD-10-CM | POA: Diagnosis not present

## 2023-02-13 DIAGNOSIS — Z96642 Presence of left artificial hip joint: Secondary | ICD-10-CM | POA: Diagnosis not present

## 2023-02-13 DIAGNOSIS — Z471 Aftercare following joint replacement surgery: Secondary | ICD-10-CM | POA: Diagnosis not present

## 2023-02-13 DIAGNOSIS — M79605 Pain in left leg: Secondary | ICD-10-CM | POA: Diagnosis not present

## 2023-02-16 DIAGNOSIS — M1612 Unilateral primary osteoarthritis, left hip: Secondary | ICD-10-CM | POA: Diagnosis not present

## 2023-02-17 DIAGNOSIS — R29898 Other symptoms and signs involving the musculoskeletal system: Secondary | ICD-10-CM | POA: Diagnosis not present

## 2023-02-17 DIAGNOSIS — Z96642 Presence of left artificial hip joint: Secondary | ICD-10-CM | POA: Diagnosis not present

## 2023-02-17 DIAGNOSIS — Z471 Aftercare following joint replacement surgery: Secondary | ICD-10-CM | POA: Diagnosis not present

## 2023-02-17 DIAGNOSIS — I890 Lymphedema, not elsewhere classified: Secondary | ICD-10-CM | POA: Diagnosis not present

## 2023-02-17 DIAGNOSIS — M79605 Pain in left leg: Secondary | ICD-10-CM | POA: Diagnosis not present

## 2023-02-20 ENCOUNTER — Other Ambulatory Visit: Payer: Self-pay

## 2023-02-20 ENCOUNTER — Emergency Department (HOSPITAL_BASED_OUTPATIENT_CLINIC_OR_DEPARTMENT_OTHER): Payer: Medicare Other

## 2023-02-20 ENCOUNTER — Encounter (HOSPITAL_BASED_OUTPATIENT_CLINIC_OR_DEPARTMENT_OTHER): Payer: Self-pay | Admitting: Emergency Medicine

## 2023-02-20 ENCOUNTER — Telehealth: Payer: Self-pay

## 2023-02-20 ENCOUNTER — Encounter: Payer: Self-pay | Admitting: Family Medicine

## 2023-02-20 ENCOUNTER — Emergency Department (HOSPITAL_BASED_OUTPATIENT_CLINIC_OR_DEPARTMENT_OTHER)
Admission: EM | Admit: 2023-02-20 | Discharge: 2023-02-20 | Disposition: A | Discharge status: Home / Self Care | Payer: Medicare Other | Attending: Emergency Medicine | Admitting: Emergency Medicine

## 2023-02-20 DIAGNOSIS — M79661 Pain in right lower leg: Secondary | ICD-10-CM | POA: Insufficient documentation

## 2023-02-20 DIAGNOSIS — M79605 Pain in left leg: Secondary | ICD-10-CM | POA: Insufficient documentation

## 2023-02-20 DIAGNOSIS — M79604 Pain in right leg: Secondary | ICD-10-CM | POA: Diagnosis not present

## 2023-02-20 DIAGNOSIS — Z7901 Long term (current) use of anticoagulants: Secondary | ICD-10-CM | POA: Insufficient documentation

## 2023-02-20 DIAGNOSIS — E039 Hypothyroidism, unspecified: Secondary | ICD-10-CM | POA: Diagnosis not present

## 2023-02-20 DIAGNOSIS — M7989 Other specified soft tissue disorders: Secondary | ICD-10-CM | POA: Diagnosis not present

## 2023-02-20 DIAGNOSIS — R600 Localized edema: Secondary | ICD-10-CM | POA: Diagnosis not present

## 2023-02-20 DIAGNOSIS — R2240 Localized swelling, mass and lump, unspecified lower limb: Secondary | ICD-10-CM | POA: Diagnosis not present

## 2023-02-20 DIAGNOSIS — Z91040 Latex allergy status: Secondary | ICD-10-CM | POA: Insufficient documentation

## 2023-02-20 LAB — CBC WITH DIFFERENTIAL/PLATELET
Abs Immature Granulocytes: 0.02 10*3/uL (ref 0.00–0.07)
Basophils Absolute: 0.1 10*3/uL (ref 0.0–0.1)
Basophils Relative: 1 %
Eosinophils Absolute: 0.3 10*3/uL (ref 0.0–0.5)
Eosinophils Relative: 5 %
HCT: 31.6 % — ABNORMAL LOW (ref 36.0–46.0)
Hemoglobin: 10.2 g/dL — ABNORMAL LOW (ref 12.0–15.0)
Immature Granulocytes: 0 %
Lymphocytes Relative: 25 %
Lymphs Abs: 1.6 10*3/uL (ref 0.7–4.0)
MCH: 31.5 pg (ref 26.0–34.0)
MCHC: 32.3 g/dL (ref 30.0–36.0)
MCV: 97.5 fL (ref 80.0–100.0)
Monocytes Absolute: 0.6 10*3/uL (ref 0.1–1.0)
Monocytes Relative: 9 %
Neutro Abs: 3.7 10*3/uL (ref 1.7–7.7)
Neutrophils Relative %: 60 %
Platelets: 247 10*3/uL (ref 150–400)
RBC: 3.24 MIL/uL — ABNORMAL LOW (ref 3.87–5.11)
RDW: 13.9 % (ref 11.5–15.5)
WBC: 6.2 10*3/uL (ref 4.0–10.5)
nRBC: 0 % (ref 0.0–0.2)

## 2023-02-20 LAB — COMPREHENSIVE METABOLIC PANEL
ALT: 10 U/L (ref 0–44)
AST: 20 U/L (ref 15–41)
Albumin: 3.3 g/dL — ABNORMAL LOW (ref 3.5–5.0)
Alkaline Phosphatase: 113 U/L (ref 38–126)
Anion gap: 9 (ref 5–15)
BUN: 13 mg/dL (ref 8–23)
CO2: 27 mmol/L (ref 22–32)
Calcium: 8.5 mg/dL — ABNORMAL LOW (ref 8.9–10.3)
Chloride: 103 mmol/L (ref 98–111)
Creatinine, Ser: 0.86 mg/dL (ref 0.44–1.00)
GFR, Estimated: >60 mL/min (ref >60)
Glucose, Bld: 102 mg/dL — ABNORMAL HIGH (ref 70–99)
Potassium: 3.6 mmol/L (ref 3.5–5.1)
Sodium: 139 mmol/L (ref 135–145)
Total Bilirubin: 0.5 mg/dL (ref 0.3–1.2)
Total Protein: 7 g/dL (ref 6.5–8.1)

## 2023-02-20 LAB — BRAIN NATRIURETIC PEPTIDE: B Natriuretic Peptide: 137.5 pg/mL — ABNORMAL HIGH (ref 0.0–100.0)

## 2023-02-20 MED ORDER — POTASSIUM CHLORIDE CRYS ER 10 MEQ PO TBCR: 10.0000 meq | EXTENDED_RELEASE_TABLET | Freq: Every day | ORAL | 0 refills | Status: DC

## 2023-02-20 MED ORDER — FUROSEMIDE 20 MG PO TABS: 20.0000 mg | ORAL_TABLET | Freq: Every day | ORAL | 0 refills | Status: DC

## 2023-02-20 MED ORDER — POTASSIUM CHLORIDE 10 MEQ PO PACK: 1.0000 | PACK | Freq: Every day | ORAL | 0 refills | Status: DC

## 2023-02-20 NOTE — ED Provider Notes (Signed)
EMERGENCY DEPARTMENT AT [facility] Provider Note   CSN: [id number] Arrival date & time: 02/20/23  1111     History  Chief Complaint  Patient presents with   Leg Pain    Mary Buck is a 87 y.o. female.  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  Patient states that she has had some swelling in her left leg postop from her lymphedema and has been seeing physical therapy.  She states that swelling has been stable to mildly increased.  She states however over the last week she started to develop swelling in her right leg.  She states that she also has pain shooting up her bilateral lower extremities that feels like a nerve type of pain.  She denies any chest pain or shortness of breath.  She states that she has been compliant on her Eliquis and is due to complete this in a few days.  He states that she did see her surgeon on Monday and stated that she appeared to be healing well postop.  The history is provided by the patient and a relative.  Leg Pain      Home Medications Prior to Admission medications   Medication Sig Start Date End Date Taking? Authorizing Provider  furosemide (LASIX) 20 MG tablet Take 1 tablet (20 mg total) by mouth daily. 02/20/23  Yes Theresia Lo, Turkey K, DO  Potassium Chloride 10 MEQ PACK Take 1 packet by mouth daily. 02/20/23  Yes Theresia Lo, Turkey K, DO  apixaban (ELIQUIS) 2.5 MG TABS tablet Take 1 tablet (2.5 mg total) by mouth 2 (two) times daily. 01/20/23   Armida Sans, PA-C  Carboxymethylcellulose Sod PF (LUBRICANT EYE DROPS PF) 0.5 % SOLN Place 1 drop into both eyes 4 (four) times daily as needed (dry eyes).    [provider]  Carboxymethylcellulose Sodium (REFRESH LIQUIGEL) 1 % GEL Place 1 drop  into both eyes at bedtime.    [provider]  famotidine (PEPCID) 20 MG tablet TAKE 1 TABLET(20 MG) BY MOUTH AT BEDTIME 05/06/22   Cirigliano, Vito V, DO  gabapentin (NEURONTIN) 300 MG capsule Take 1 capsule (300 mg total) by mouth 3 (three) times daily. Patient taking differently: Take 300 mg by mouth 2 (two) times daily before lunch and supper. 04/30/17   Copland, Gwenlyn Found, MD  lansoprazole (PREVACID) 30 MG capsule TAKE 1 CAPSULE(30 MG) BY MOUTH TWICE DAILY BEFORE A MEAL 10/14/22   Copland, Gwenlyn Found, MD  levothyroxine (SYNTHROID) 88 MCG tablet Take 1 tablet (88 mcg total) by mouth daily before breakfast. 12/30/22   Copland, Gwenlyn Found, MD  misoprostol (CYTOTEC) 100 MCG tablet TAKE 2 TABLETS BY MOUTH AFTER BREAKFAST AND AFTER DINNER AS DIRECTED 05/21/22   Cirigliano, Vito V, DO  nystatin (MYCOSTATIN/NYSTOP) powder Apply 1 Application topically 3 (three) times daily. Patient taking differently: Apply 1 Application topically daily. 04/09/22   Copland, Gwenlyn Found, MD  ondansetron (ZOFRAN-ODT) 4 MG disintegrating tablet Take 1 tablet (4 mg total) by mouth every 8 (eight) hours as needed for nausea or vomiting. 01/20/23   Armida Sans, PA-C  Probiotic Product (RISAQUAD PO) Take 1 capsule by mouth daily.    [provider]  sennosides-docusate sodium (SENOKOT-S) 8.6-50 MG tablet Take 2 tablets by mouth daily. While taking pain medication (  for constipation caused by pain medication) 01/20/23   Armida Sans, PA-C  simvastatin (ZOCOR) 20 MG tablet TAKE 1 TABLET(20 MG) BY MOUTH DAILY 12/31/22   Copland, Gwenlyn Found, MD  sodium chloride (MURO 128) 5 % ophthalmic solution Place 1 drop into both eyes at bedtime.    [provider]  Vitamin D-Vitamin K (VITAMIN K2-VITAMIN D3 PO) Take 1 tablet by mouth daily.    [provider]      Allergies    Contrast media [iodinated contrast media], Oxycodone, Latex, Mirtazapine, Acitretin, Cephalexin, Elemental sulfur, Penicillins, and Sulfa  antibiotics    Review of Systems   Review of Systems  Physical Exam Updated Vital Signs BP 131/73   Pulse 65   Temp 98.3 F (36.8 C) (Oral)   Resp 18   Ht 5\' 4"  (1.626 m)   Wt 72.4 kg   SpO2 99%   BMI 27.40 kg/m  Physical Exam Vitals and nursing note reviewed.  Constitutional:      General: She is not in acute distress.    Appearance: Normal appearance.  HENT:     Head: Normocephalic and atraumatic.     Nose: Nose normal.     Mouth/Throat:     Mouth: Mucous membranes are moist.     Pharynx: Oropharynx is clear.  Eyes:     Extraocular Movements: Extraocular movements intact.     Conjunctiva/sclera: Conjunctivae normal.  Cardiovascular:     Rate and Rhythm: Normal rate and regular rhythm.     Pulses: Normal pulses.     Heart sounds: Normal heart sounds.  Pulmonary:     Effort: Pulmonary effort is normal.     Breath sounds: Normal breath sounds.  Abdominal:     General: Abdomen is flat.     Palpations: Abdomen is soft.     Tenderness: There is no abdominal tenderness.  Musculoskeletal:        General: Normal range of motion.     Cervical back: Normal range of motion.     Comments: Edema to L thigh, radiation skin changes to L shin without swelling to shin or foot 2+ edema to RLE to knee, no overlying skin changes  Skin:    General: Skin is warm and dry.  Neurological:     General: No focal deficit present.     Mental Status: She is alert and oriented to person, place, and time.  Psychiatric:        Mood and Affect: Mood normal.        Behavior: Behavior normal.     ED Results / Procedures / Treatments   Labs (all labs ordered are listed, but only abnormal results are displayed) Labs Reviewed  COMPREHENSIVE METABOLIC PANEL - Abnormal; Notable for the following components:      Result Value   Glucose, Bld 102 (*)    Calcium 8.5 (*)    Albumin 3.3 (*)    All other components within normal limits  CBC WITH DIFFERENTIAL/PLATELET - Abnormal; Notable for the  following components:   RBC 3.24 (*)    Hemoglobin 10.2 (*)    HCT 31.6 (*)    All other components within normal limits  BRAIN NATRIURETIC PEPTIDE - Abnormal; Notable for the following components:   B Natriuretic Peptide 137.5 (*)    All other components within normal limits    EKG None  Radiology DG Chest 2 View  Result Date: 02/20/2023 CLINICAL DATA:  Surgery 1 month ago now with leg swelling.  EXAM: CHEST - 2 VIEW COMPARISON:  08/04/2019 FINDINGS: Lungs are adequately inflated without focal airspace consolidation or effusion. Patient is slightly rotated to the right. Cardiomediastinal silhouette and remainder of the exam is unchanged. IMPRESSION: No acute cardiopulmonary disease. Electronically Signed   By: Elberta Fortis M.D.   On: 02/20/2023 14:37   US Venous Img Lower Bilateral  Result Date: 02/20/2023 CLINICAL DATA:  Bilateral lower extremity pain and edema recent hip surgery. Evaluate for DVT. EXAM: BILATERAL LOWER EXTREMITY VENOUS DOPPLER ULTRASOUND TECHNIQUE: Gray-scale sonography with graded compression, as well as color Doppler and duplex ultrasound were performed to evaluate the lower extremity deep venous systems from the level of the common femoral vein and including the common femoral, femoral, profunda femoral, popliteal and calf veins including the posterior tibial, peroneal and gastrocnemius veins when visible. The superficial great saphenous vein was also interrogated. Spectral Doppler was utilized to evaluate flow at rest and with distal augmentation maneuvers in the common femoral, femoral and popliteal veins. COMPARISON:  Left hip radiographic series-01/20/2023 FINDINGS: RIGHT LOWER EXTREMITY Common Femoral Vein: No evidence of thrombus. Normal compressibility, respiratory phasicity and response to augmentation. Saphenofemoral Junction: No evidence of thrombus. Normal compressibility and flow on color Doppler imaging. Profunda Femoral Vein: No evidence of thrombus. Normal  compressibility and flow on color Doppler imaging. Femoral Vein: No evidence of thrombus. Normal compressibility, respiratory phasicity and response to augmentation. Popliteal Vein: No evidence of thrombus. Normal compressibility, respiratory phasicity and response to augmentation. Calf Veins: No evidence of thrombus. Normal compressibility and flow on color Doppler imaging. Superficial Great Saphenous Vein: No evidence of thrombus. Normal compressibility. Other Findings: There is a moderate amount of subcutaneous edema at the level of the right calf. LEFT LOWER EXTREMITY Common Femoral Vein: No evidence of thrombus. Normal compressibility, respiratory phasicity and response to augmentation. Saphenofemoral Junction: No evidence of thrombus. Normal compressibility and flow on color Doppler imaging. Profunda Femoral Vein: No evidence of thrombus. Normal compressibility and flow on color Doppler imaging. Femoral Vein: No evidence of thrombus. Normal compressibility, respiratory phasicity and response to augmentation. Popliteal Vein: No evidence of thrombus. Normal compressibility, respiratory phasicity and response to augmentation. Calf Veins: Appear patent where visualized. Superficial Great Saphenous Vein: No evidence of thrombus. Normal compressibility. Other Findings:  None. IMPRESSION: No evidence of DVT within either lower extremity. Electronically Signed   By: Simonne Come M.D.   On: 02/20/2023 13:43    Procedures Procedures    Medications Ordered in ED Medications - No data to display  ED Course/ Medical Decision Making/ A&P Clinical Course as of 02/20/23 1513  Fri Feb 20, 2023  1302 Mild anemia, likely related to post-op. Mildly elevated BNP [VK]  1349 No evidence of DVT on ultrasound. [VK]  1440 No acute abnormality on CXR. Symptoms possibly due to lymphedema. She will be give compression stockings and short course of lasix. She is recommended close primary care follow up. [VK]    Clinical  Course User Index [VK] Rexford Maus, DO                             Medical Decision Making This patient presents to the ED with chief complaint(s) of LE swelling with pertinent past medical history of recent L hip replacement on Eliquis, LLE cancer s/p radiation with lymphedema, hypothyroidism which further complicates the presenting complaint. The complaint involves an extensive differential diagnosis and also carries with it a high risk of complications  and morbidity.    The differential diagnosis includes considering DVT, new onset CHF, hepatic dysfunction, renal dysfunction, lymphedema, no evidence of cellulitis, no trauma making fracture dislocation unlikely  Additional history obtained: Additional history obtained from family Records reviewed previous admission documents  ED Course and Reassessment: On patient's arrival to the emergency department she is hemodynamically stable in no acute distress.  She does have significant edema to the right lower extremity as well as left thigh.  She will have bilateral lower extremity DVT studies performed.  The patient will additionally have labs to evaluate for CHF, hepatic or renal dysfunction causing her lower extremity swelling and she will be closely reassessed.  Independent labs interpretation:  The following labs were independently interpreted: mildly elevated BNP, otherwise within normal range  Independent visualization of imaging: - I independently visualized the following imaging with scope of interpretation limited to determining acute life threatening conditions related to emergency care: bilateral LE DVT US, which revealed negative for DVT  Consultation: - Consulted or discussed management/test interpretation w/ external professional: N/A  Consideration for admission or further workup: Patient has no emergent conditions requiring admission or further work-up at this time and is stable for discharge home with primary care  follow-up  Social Determinants of health: N/A    Amount and/or Complexity of Data Reviewed Labs: ordered. Radiology: ordered.  Risk Prescription drug management.          Final Clinical Impression(s) / ED Diagnoses Final diagnoses:  Leg swelling    Rx / DC Orders ED Discharge Orders          Ordered    furosemide (LASIX) 20 MG tablet  Daily        02/20/23 1511    Potassium Chloride 10 MEQ PACK  Daily        02/20/23 1511    Compression stockings        02/20/23 1511              Rexford Maus, DO 02/20/23 1513

## 2023-02-20 NOTE — Telephone Encounter (Signed)
Pt called today asking to speak to me- she says she wanted to see you because she is in a lot of pain and having lymphedema. No one here in the office has any openings. We have a few openings at other locations. (I'm not really sure that she would want to go to).   I did let her know that I would try to reach you to try to get further advise because she seemed like she only wanted to see you.   I asked if she could send a mychart message and she said she was not going to be able to do that.

## 2023-02-20 NOTE — Discharge Instructions (Signed)
You were seen in the emergency department for your leg swelling.  Your workup showed no blood clots in your legs no obvious cause of your swelling.  I have given you some Lasix to take for the next few days to help with the swelling as well as a potassium supplement to take with the Lasix.  You can crush the Lasix tablet.  You can also wear compression stockings to help with the swelling and keep your legs elevated whenever you are at rest.  You should follow-up with your primary doctor in the next few days to have your symptoms rechecked.  You should return to the emergency department if you have significantly worsening swelling, you have redness or warmth to your leg, you have fevers, you feel short of breath or you have any other new or concerning symptoms.

## 2023-02-20 NOTE — Telephone Encounter (Signed)
I called her back- she is having swelling in the leg that typically does not swell. It is not weeping but is really sore.  She is not having any fever.  She has a lot of pain.  No SOB or weight change.  As she is having a lot of pain and this is different than her usual swelling.  I advised her to be seen at the [facility] ER and she states agreement

## 2023-02-20 NOTE — ED Triage Notes (Signed)
Had left surgery a month now rt leg swollen and hurts to walk and foot is swollen also x 1 week

## 2023-02-22 NOTE — Progress Notes (Unsigned)
Healthcare at [facility] [address] [place], [place] [address] [phone]  Date:  02/25/2023   Name:  Mary Buck   DOB:  05-24-1936   MRN:  [id number]  PCP:  Pearline Cables, MD    Chief Complaint: No chief complaint on file.   History of Present Illness:  Mary Buck is a 87 y.o. 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  She does see pain management  She underwent a total hip about 1 month ago and did well However, over the weekend patient and her partner contacted me with concern of increased leg swelling She was seen in the emergency department at [facility] on June 7  Mary Buck has history of left leg swelling from skin cancer treatment and also had her left hip replaced.  She contacted me over the weekend with concern of right leg swelling and pain.  She was taking Eliquis for DVT prophylaxis Her ER evaluation was reassuring-BNP was mildly elevated, otherwise lab evaluation looked okay and ultrasound negative for DVT/she was discharged to home with Lasix and potassium short-term Patient Active Problem List   Diagnosis Date Noted   S/P hip replacement, left 01/20/2023   Thyroid disease    Heart murmur    GERD (gastroesophageal reflux disease)    Depression    Cataract    Cancer (HCC)    Asthma    Allergy    IDA (iron deficiency anemia) 05/31/2020   Osteopenia 05/30/2019   Pedal edema 01/31/2018   Squamous cell carcinoma of skin of left lower limb, including hip 12/16/2017    Plantar fasciitis 07/07/2017   DDD (degenerative disc disease), lumbar 06/01/2017   Fibromyalgia 06/01/2017   Spinal stenosis at L4-L5 level 04/30/2017   Peripheral neuropathy 04/30/2017   Encounter for therapeutic drug monitoring 03/05/2017   Bladder cancer (HCC) 08/20/2015   Arthropathy of lumbar facet joint 07/27/2015   Spinal stenosis of cervical region 07/27/2015   Spondylolisthesis 07/27/2015   Hypothyroid 07/05/2015   Abdominal pain, acute, left upper quadrant 06/28/2015   Basal cell carcinoma, face 06/28/2015   Bilateral groin pain 06/28/2015   Chronic pain 06/28/2015   Fatigue 06/28/2015   Hyperlipidemia 06/28/2015   Bilateral hip pain 06/28/2015   Paresthesia of lower lip 06/28/2015   Pulmonary nodule 06/28/2015   Pustulosis palmaris et plantaris 06/28/2015   Ulcer of nose 06/28/2015   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   Mass of parotid gland 03/24/2014    Past Medical History:  Diagnosis Date   Allergy    Asthma    as teen   Cataract    DDD (degenerative disc disease), lumbar 06/01/2017   Depression    GERD (gastroesophageal reflux disease)    Heart murmur    no issues   Hyperlipidemia    Hypothyroidism    IDA (iron deficiency anemia) 05/31/2020   Peripheral neuropathy 04/30/2017   Skin cancer    left leg had radiation, bladder cancer   Sleep apnea    no CPAP   Thyroid disease     Past Surgical History:  Procedure Laterality Date  bladder cancer  2013   CARPAL TUNNEL RELEASE  2015   CATARACT EXTRACTION Bilateral 2012   CHOLECYSTECTOMY     COLONOSCOPY WITH ESOPHAGOGASTRODUODENOSCOPY (EGD)  02/22/2018   [facility]   SALIVARY GLAND SURGERY Left 2015   benign   SPINE SURGERY  6/31/21   vertaflex   TONSILLECTOMY AND ADENOIDECTOMY  1943   TOTAL HIP ARTHROPLASTY Left 01/20/2023   Procedure: TOTAL HIP ARTHROPLASTY;  Surgeon: Teryl Lucy, MD;  Location: [facility];  Service: Orthopedics;  Laterality: Left;    Social History    Tobacco Use   Smoking status: Former    Types: Cigarettes    Quit date: 2015    Years since quitting: 9.4   Smokeless tobacco: Never  Vaping Use   Vaping Use: Never used  Substance Use Topics   Alcohol use: No   Drug use: No    Family History  Problem Relation Age of Onset   Macular degeneration Mother    Hyperlipidemia Father    Stroke Father    Cancer Brother        ? stomach   Prostate cancer Other        in brothers   Colon cancer Neg Hx     Allergies  Allergen Reactions   Contrast Media [Iodinated Contrast Media] Anaphylaxis   Oxycodone Anaphylaxis    "my throat swells with any opioid" (tolerated hydrocodone 01/21/23)   Latex Other (See Comments)    Other reaction(s): blistering, redness    Mirtazapine Nausea Only   Acitretin Rash   Cephalexin Other (See Comments)    Unknown (tolerated Ancef 01/21/23)   Elemental Sulfur Other (See Comments)    unknown   Penicillins Other (See Comments)    Unknown   Sulfa Antibiotics Other (See Comments)    Unknown    Medication list has been reviewed and updated.  Current Outpatient Medications on File Prior to Visit  Medication Sig Dispense Refill   apixaban (ELIQUIS) 2.5 MG TABS tablet Take 1 tablet (2.5 mg total) by mouth 2 (two) times daily. 60 tablet 0   Carboxymethylcellulose Sod PF (LUBRICANT EYE DROPS PF) 0.5 % SOLN Place 1 drop into both eyes 4 (four) times daily as needed (dry eyes).     Carboxymethylcellulose Sodium (REFRESH LIQUIGEL) 1 % GEL Place 1 drop into both eyes at bedtime.     famotidine (PEPCID) 20 MG tablet TAKE 1 TABLET(20 MG) BY MOUTH AT BEDTIME 90 tablet 3   furosemide (LASIX) 20 MG tablet Take 1 tablet (20 mg total) by mouth daily. 5 tablet 0   gabapentin (NEURONTIN) 300 MG capsule Take 1 capsule (300 mg total) by mouth 3 (three) times daily. (Patient taking differently: Take 300 mg by mouth 2 (two) times daily before lunch and supper.) 90 capsule 4   lansoprazole (PREVACID) 30 MG capsule TAKE 1  CAPSULE(30 MG) BY MOUTH TWICE DAILY BEFORE A MEAL 180 capsule 1   levothyroxine (SYNTHROID) 88 MCG tablet Take 1 tablet (88 mcg total) by mouth daily before breakfast. 90 tablet 1   misoprostol (CYTOTEC) 100 MCG tablet TAKE 2 TABLETS BY MOUTH AFTER BREAKFAST AND AFTER DINNER AS DIRECTED 120 tablet 5   nystatin (MYCOSTATIN/NYSTOP) powder Apply 1 Application topically 3 (three) times daily. (Patient taking differently: Apply 1 Application topically daily.) 30 g 1   ondansetron (ZOFRAN-ODT) 4 MG disintegrating tablet Take 1 tablet (4 mg total) by mouth every 8 (eight) hours as needed for nausea or vomiting. 10 tablet 0  potassium chloride (KLOR-CON M) 10 MEQ tablet Take 1 tablet (10 mEq total) by mouth daily. 10 tablet 0   Probiotic Product (RISAQUAD PO) Take 1 capsule by mouth daily.     sennosides-docusate sodium (SENOKOT-S) 8.6-50 MG tablet Take 2 tablets by mouth daily. While taking pain medication (for constipation caused by pain medication) 30 tablet 1   simvastatin (ZOCOR) 20 MG tablet TAKE 1 TABLET(20 MG) BY MOUTH DAILY 90 tablet 3   sodium chloride (MURO 128) 5 % ophthalmic solution Place 1 drop into both eyes at bedtime.     Vitamin D-Vitamin K (VITAMIN K2-VITAMIN D3 PO) Take 1 tablet by mouth daily.     No current facility-administered medications on file prior to visit.    Review of Systems:  As per HPI- otherwise negative.   Physical Examination: There were no vitals filed for this visit. There were no vitals filed for this visit. There is no height or weight on file to calculate BMI. Ideal Body Weight:    GEN: no acute distress. HEENT: Atraumatic, Normocephalic.  Ears and Nose: No external deformity. CV: RRR, No M/G/R. No JVD. No thrill. No extra heart sounds. PULM: CTA B, no wheezes, crackles, rhonchi. No retractions. No resp. distress. No accessory muscle use. ABD: S, NT, ND, +BS. No rebound. No HSM. EXTR: No c/c/e PSYCH: Normally interactive. Conversant.     Assessment and Plan: ***  Signed Abbe Amsterdam, MD

## 2023-02-24 DIAGNOSIS — Z471 Aftercare following joint replacement surgery: Secondary | ICD-10-CM | POA: Diagnosis not present

## 2023-02-24 DIAGNOSIS — M79605 Pain in left leg: Secondary | ICD-10-CM | POA: Diagnosis not present

## 2023-02-24 DIAGNOSIS — Z96642 Presence of left artificial hip joint: Secondary | ICD-10-CM | POA: Diagnosis not present

## 2023-02-24 DIAGNOSIS — M5136 Other intervertebral disc degeneration, lumbar region: Secondary | ICD-10-CM | POA: Diagnosis not present

## 2023-02-24 DIAGNOSIS — I890 Lymphedema, not elsewhere classified: Secondary | ICD-10-CM | POA: Diagnosis not present

## 2023-02-24 DIAGNOSIS — M48062 Spinal stenosis, lumbar region with neurogenic claudication: Secondary | ICD-10-CM | POA: Diagnosis not present

## 2023-02-24 DIAGNOSIS — G894 Chronic pain syndrome: Secondary | ICD-10-CM | POA: Diagnosis not present

## 2023-02-24 DIAGNOSIS — R29898 Other symptoms and signs involving the musculoskeletal system: Secondary | ICD-10-CM | POA: Diagnosis not present

## 2023-02-25 ENCOUNTER — Ambulatory Visit (INDEPENDENT_AMBULATORY_CARE_PROVIDER_SITE_OTHER): Payer: Medicare Other | Admitting: Family Medicine

## 2023-02-25 VITALS — BP 124/60 | HR 70 | Temp 98.0°F | Resp 18 | Ht 64.0 in | Wt 163.2 lb

## 2023-02-25 DIAGNOSIS — R413 Other amnesia: Secondary | ICD-10-CM | POA: Diagnosis not present

## 2023-02-25 DIAGNOSIS — E039 Hypothyroidism, unspecified: Secondary | ICD-10-CM | POA: Diagnosis not present

## 2023-02-25 DIAGNOSIS — F5102 Adjustment insomnia: Secondary | ICD-10-CM | POA: Diagnosis not present

## 2023-02-25 DIAGNOSIS — R600 Localized edema: Secondary | ICD-10-CM | POA: Diagnosis not present

## 2023-02-25 LAB — COMPREHENSIVE METABOLIC PANEL
ALT: 9 U/L (ref 0–35)
AST: 15 U/L (ref 0–37)
Albumin: 3.5 g/dL (ref 3.5–5.2)
Alkaline Phosphatase: 106 U/L (ref 39–117)
BUN: 14 mg/dL (ref 6–23)
CO2: 31 mEq/L (ref 19–32)
Calcium: 8.7 mg/dL (ref 8.4–10.5)
Chloride: 105 mEq/L (ref 96–112)
Creatinine, Ser: 0.9 mg/dL (ref 0.40–1.20)
GFR: 58.73 mL/min — ABNORMAL LOW (ref >60.00)
Glucose, Bld: 93 mg/dL (ref 70–99)
Potassium: 4.5 mEq/L (ref 3.5–5.1)
Sodium: 144 mEq/L (ref 135–145)
Total Bilirubin: 0.4 mg/dL (ref 0.2–1.2)
Total Protein: 6.3 g/dL (ref 6.0–8.3)

## 2023-02-25 LAB — CBC
HCT: 31.9 % — ABNORMAL LOW (ref 36.0–46.0)
Hemoglobin: 10.1 g/dL — ABNORMAL LOW (ref 12.0–15.0)
MCHC: 31.7 g/dL (ref 30.0–36.0)
MCV: 98.1 fl (ref 78.0–100.0)
Platelets: 229 10*3/uL (ref 150.0–400.0)
RBC: 3.25 Mil/uL — ABNORMAL LOW (ref 3.87–5.11)
RDW: 15.1 % (ref 11.5–15.5)
WBC: 6.2 10*3/uL (ref 4.0–10.5)

## 2023-02-25 LAB — VITAMIN B12: Vitamin B-12: 329 pg/mL (ref 211–911)

## 2023-02-25 LAB — TSH: TSH: 4.35 u[IU]/mL (ref 0.35–5.50)

## 2023-02-25 LAB — BRAIN NATRIURETIC PEPTIDE: Pro B Natriuretic peptide (BNP): 160 pg/mL — ABNORMAL HIGH (ref 0.0–100.0)

## 2023-02-25 MED ORDER — FUROSEMIDE 20 MG PO TABS
20.0000 mg | ORAL_TABLET | Freq: Every day | ORAL | 1 refills | Status: DC
Start: 2023-02-25 — End: 2023-03-31

## 2023-02-25 MED ORDER — QUETIAPINE FUMARATE 25 MG PO TABS
25.0000 mg | ORAL_TABLET | Freq: Every day | ORAL | 2 refills | Status: DC
Start: 2023-02-25 — End: 2023-03-12

## 2023-02-25 MED ORDER — POTASSIUM CHLORIDE CRYS ER 10 MEQ PO TBCR: 10.0000 meq | EXTENDED_RELEASE_TABLET | Freq: Every day | ORAL | 2 refills | Status: DC

## 2023-02-25 NOTE — Patient Instructions (Signed)
It was good to see you again today, I am sorry you are having a hard time with the swelling.  We will check on your lab work today, assuming no concerns with your electrolytes or kidney function we will likely want to go up a bit on your furosemide dosage.  I will be in touch with you ASAP.  We will also check your BNP again to make sure no concern for heart failure  For insomnia and difficulty with memory and confusion, lets have you try Seroquel 25 mg at bedtime.  We can go up to 50 after a week or so as needed.  If you do not tolerate it well or it is not working simply stop taking it and let me know

## 2023-02-26 ENCOUNTER — Encounter: Payer: Self-pay | Admitting: Family Medicine

## 2023-02-27 DIAGNOSIS — Z96642 Presence of left artificial hip joint: Secondary | ICD-10-CM | POA: Diagnosis not present

## 2023-02-27 DIAGNOSIS — R29898 Other symptoms and signs involving the musculoskeletal system: Secondary | ICD-10-CM | POA: Diagnosis not present

## 2023-02-27 DIAGNOSIS — Z471 Aftercare following joint replacement surgery: Secondary | ICD-10-CM | POA: Diagnosis not present

## 2023-02-27 DIAGNOSIS — M79605 Pain in left leg: Secondary | ICD-10-CM | POA: Diagnosis not present

## 2023-02-27 DIAGNOSIS — I890 Lymphedema, not elsewhere classified: Secondary | ICD-10-CM | POA: Diagnosis not present

## 2023-03-02 ENCOUNTER — Ambulatory Visit: Payer: Medicare Other | Admitting: Family Medicine

## 2023-03-03 DIAGNOSIS — R29898 Other symptoms and signs involving the musculoskeletal system: Secondary | ICD-10-CM | POA: Diagnosis not present

## 2023-03-03 DIAGNOSIS — I890 Lymphedema, not elsewhere classified: Secondary | ICD-10-CM | POA: Diagnosis not present

## 2023-03-03 DIAGNOSIS — Z96642 Presence of left artificial hip joint: Secondary | ICD-10-CM | POA: Diagnosis not present

## 2023-03-03 DIAGNOSIS — Z471 Aftercare following joint replacement surgery: Secondary | ICD-10-CM | POA: Diagnosis not present

## 2023-03-03 DIAGNOSIS — M79605 Pain in left leg: Secondary | ICD-10-CM | POA: Diagnosis not present

## 2023-03-09 DIAGNOSIS — Z471 Aftercare following joint replacement surgery: Secondary | ICD-10-CM | POA: Diagnosis not present

## 2023-03-09 DIAGNOSIS — Z96642 Presence of left artificial hip joint: Secondary | ICD-10-CM | POA: Diagnosis not present

## 2023-03-09 DIAGNOSIS — R29898 Other symptoms and signs involving the musculoskeletal system: Secondary | ICD-10-CM | POA: Diagnosis not present

## 2023-03-09 DIAGNOSIS — M79605 Pain in left leg: Secondary | ICD-10-CM | POA: Diagnosis not present

## 2023-03-09 DIAGNOSIS — I890 Lymphedema, not elsewhere classified: Secondary | ICD-10-CM | POA: Diagnosis not present

## 2023-03-11 ENCOUNTER — Encounter: Payer: Self-pay | Admitting: Family Medicine

## 2023-03-12 ENCOUNTER — Ambulatory Visit (INDEPENDENT_AMBULATORY_CARE_PROVIDER_SITE_OTHER): Payer: Medicare Other | Admitting: Family Medicine

## 2023-03-12 ENCOUNTER — Encounter: Payer: Self-pay | Admitting: Family Medicine

## 2023-03-12 VITALS — BP 126/66 | HR 70 | Temp 98.0°F | Resp 12 | Ht 64.0 in | Wt 157.8 lb

## 2023-03-12 DIAGNOSIS — R600 Localized edema: Secondary | ICD-10-CM

## 2023-03-12 NOTE — Progress Notes (Signed)
[facility] at [facility] [address], [address] [place], [place] [address] [phone]  Date:  03/12/2023   Name:  Mary Buck   DOB:  [birth year]   MRN:  [id number]  PCP:  Pearline Cables, MD    Chief Complaint: Roland Rack boot application- legs   History of Present Illness:  Mary Buck is a 87 y.o. very pleasant female patient who presents with the following:  Seen today for placement of Unnaboot- see last visit 6/12 Following up today for concern of worsening lower extremity edema.  This may be due to decreased ambulation after hip replacement surgery.  I offered to try an Unna boot but she prefers not to at this time. I will check her lab work, if her kidneys and potassium can tolerate it we will try going up a bit on her furosemide Memory change and confusion-may be related to increased stressors from surgery, patient not sleeping well.  Will check a B12 and thyroid, will have her try 25 mg of Seroquel at bedtime to help with sleep.  I have asked him to let me know how this works  BNP 6/7- 137 US done 6/7- negative for DVT   Lab results from 6/12:  Kidney function, electrolytes look fine.  If you would like, can try taking 2 doses of furosemide for a few days to see if we can get some fluid out of your legs.  Would recommend taking 1 dose first thing, and a second dose around noon.  Recommend taking 1 or 2 of the potassium pills on days that you use the furosemide.  If you take 2 doses that day, could take 2 potassium pills if you can tolerate Once again, your BNP/heart failure test is in that less helpful middle range.  Still on the lower side, so I think heart failure is less likely especially as your recent chest x-ray did not show any fluid in the lungs.  However, to definitively rule out heart failure we would need to get an echocardiogram-I am glad to set this up if you would like  Patient notes her right lower extremity  swelling has gotten worse over the last couple of days, she has some swelling on the left as well but it is minimal.  The right leg is uncomfortable, it may occasionally weep a little bit of fluid. No fever or chills, she otherwise feels generally well Weight is stable She would like to try an Unna boot  She is taking just 1 dose of furosemide 20 mg daily  Wt Readings from Last 3 Encounters:  03/12/23 157 lb 12.8 oz (71.6 kg)  02/25/23 163 lb 3.2 oz (74 kg)  02/20/23 159 lb 9.8 oz (72.4 kg)     Patient Active Problem List   Diagnosis Date Noted   S/P hip replacement, left 01/20/2023   Thyroid disease    Heart murmur    GERD (gastroesophageal reflux disease)    Depression    Cataract    Cancer (HCC)    Asthma    Allergy    IDA (iron deficiency anemia) 05/31/2020   Osteopenia 05/30/2019   Pedal edema 01/31/2018   Squamous cell carcinoma of skin of left lower limb, including hip 12/16/2017   Plantar fasciitis 07/07/2017   DDD (degenerative disc disease), lumbar 06/01/2017   Fibromyalgia 06/01/2017   Spinal stenosis at L4-L5 level 04/30/2017   Peripheral neuropathy 04/30/2017   Encounter for therapeutic drug  monitoring 03/05/2017   Bladder cancer (HCC) 08/20/2015   Arthropathy of lumbar facet joint 07/27/2015   Spinal stenosis of cervical region 07/27/2015   Spondylolisthesis 07/27/2015   Hypothyroid 07/05/2015   Abdominal pain, acute, left upper quadrant 06/28/2015   Basal cell carcinoma, face 06/28/2015   Bilateral groin pain 06/28/2015   Chronic pain 06/28/2015   Fatigue 06/28/2015   Hyperlipidemia 06/28/2015   Bilateral hip pain 06/28/2015   Paresthesia of lower lip 06/28/2015   Pulmonary nodule 06/28/2015   Pustulosis palmaris et plantaris 06/28/2015   Ulcer of nose 06/28/2015   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   Mass of parotid gland 03/24/2014    Past Medical History:  Diagnosis Date   Allergy    Asthma    as teen   Cataract    DDD (degenerative disc disease),  lumbar 06/01/2017   Depression    GERD (gastroesophageal reflux disease)    Heart murmur    no issues   Hyperlipidemia    Hypothyroidism    IDA (iron deficiency anemia) 05/31/2020   Peripheral neuropathy 04/30/2017   Skin cancer    left leg had radiation, bladder cancer   Sleep apnea    no CPAP   Thyroid disease     Past Surgical History:  Procedure Laterality Date   bladder cancer  2013   CARPAL TUNNEL RELEASE  2015   CATARACT EXTRACTION Bilateral 2012   CHOLECYSTECTOMY     COLONOSCOPY WITH ESOPHAGOGASTRODUODENOSCOPY (EGD)  02/22/2018   [facility]   SALIVARY GLAND SURGERY Left 2015   benign   SPINE SURGERY  6/31/21   vertaflex   TONSILLECTOMY AND ADENOIDECTOMY  1943   TOTAL HIP ARTHROPLASTY Left 01/20/2023   Procedure: TOTAL HIP ARTHROPLASTY;  Surgeon: Teryl Lucy, MD;  Location: [facility];  Service: Orthopedics;  Laterality: Left;    Social History   Tobacco Use   Smoking status: Former    Types: Cigarettes    Quit date: 2015    Years since quitting: 9.4   Smokeless tobacco: Never  Vaping Use   Vaping Use: Never used  Substance Use Topics   Alcohol use: No   Drug use: No    Family History  Problem Relation Age of Onset   Macular degeneration Mother    Hyperlipidemia Father    Stroke Father    Cancer Brother        ? stomach   Prostate cancer Other        in brothers   Colon cancer Neg Hx     Allergies  Allergen Reactions   Contrast Media [Iodinated Contrast Media] Anaphylaxis   Oxycodone Anaphylaxis    "my throat swells with any opioid" (tolerated hydrocodone 01/21/23)   Latex Other (See Comments)    Other reaction(s): blistering, redness    Mirtazapine Nausea Only   Acitretin Rash   Cephalexin Other (See Comments)    Unknown (tolerated Ancef 01/21/23)   Elemental Sulfur Other (See Comments)    unknown   Penicillins Other (See Comments)    Unknown   Sulfa Antibiotics Other (See Comments)    Unknown    Medication  list has been reviewed and updated.  Current Outpatient Medications on File Prior to Visit  Medication Sig Dispense Refill   Carboxymethylcellulose Sod PF (LUBRICANT EYE DROPS PF) 0.5 % SOLN Place 1 drop into both eyes 4 (four) times daily as needed (dry eyes).     Carboxymethylcellulose Sodium (REFRESH LIQUIGEL) 1 % GEL Place 1 drop into  both eyes at bedtime.     famotidine (PEPCID) 20 MG tablet TAKE 1 TABLET(20 MG) BY MOUTH AT BEDTIME 90 tablet 3   furosemide (LASIX) 20 MG tablet Take 1 tablet (20 mg total) by mouth daily. 30 tablet 1   gabapentin (NEURONTIN) 300 MG capsule Take 1 capsule (300 mg total) by mouth 3 (three) times daily. (Patient taking differently: Take 300 mg by mouth 2 (two) times daily before lunch and supper.) 90 capsule 4   lansoprazole (PREVACID) 30 MG capsule TAKE 1 CAPSULE(30 MG) BY MOUTH TWICE DAILY BEFORE A MEAL 180 capsule 1   levothyroxine (SYNTHROID) 88 MCG tablet Take 1 tablet (88 mcg total) by mouth daily before breakfast. 90 tablet 1   misoprostol (CYTOTEC) 100 MCG tablet TAKE 2 TABLETS BY MOUTH AFTER BREAKFAST AND AFTER DINNER AS DIRECTED 120 tablet 5   nystatin (MYCOSTATIN/NYSTOP) powder Apply 1 Application topically 3 (three) times daily. (Patient taking differently: Apply 1 Application topically daily.) 30 g 1   potassium chloride (KLOR-CON M) 10 MEQ tablet Take 1 tablet (10 mEq total) by mouth daily. Use as needed on days you use furosemide 30 tablet 2   Probiotic Product (RISAQUAD PO) Take 1 capsule by mouth daily.     simvastatin (ZOCOR) 20 MG tablet TAKE 1 TABLET(20 MG) BY MOUTH DAILY 90 tablet 3   sodium chloride (MURO 128) 5 % ophthalmic solution Place 1 drop into both eyes at bedtime.     Vitamin D-Vitamin K (VITAMIN K2-VITAMIN D3 PO) Take 1 tablet by mouth daily.     No current facility-administered medications on file prior to visit.    Review of Systems:  As per HPI- otherwise negative.   Physical Examination: Vitals:   03/12/23 1144  BP:  126/66  Pulse: 70  Resp: 12  Temp: 98 F (36.7 C)  SpO2: 94%   Vitals:   03/12/23 1144  Weight: 157 lb 12.8 oz (71.6 kg)  Height: 5\' 4"  (1.626 m)   Body mass index is 27.09 kg/m. Ideal Body Weight: Weight in (lb) to have BMI = 25: 145.3 GEN: No acute distress; alert,appropriate.  Looks well and her normal self except for 1-2+ edema of the right lower extremity.  Left lower extremity displays trace edema PULM: Breathing comfortably in no respiratory distress PSYCH: Normally interactive.    Verbal consent obtained.  Applied Unna boot bandage, covered by gauze and then an Ace elastic bandage to right lower extremity.  Patient tolerated well with no complications  Assessment and Plan: Bilateral lower extremity edema  Patient seen today with concern of bilateral lower extremity edema.  This appears most likely due to venous insufficiency.  This may be exacerbated by decreased ambulation due to recent hip replacement surgery as well as extremely hot weather.  We will try an Unna boot to see if it may be helpful.  I applied an Radio broadcast assistant as above, asked them to leave in place for 3 days assuming she is tolerating well.  Certainly cause any pain or discomfort remove promptly And try taking a second dose of furosemide daily for a few days  They will keep me posted about her progress  Signed Abbe Amsterdam, MD

## 2023-03-12 NOTE — Patient Instructions (Addendum)
It was good to see you today, I hope the Unnaboot will be helpful.  Leave it in place for 3 days or longer if desired, but you can remove it anytime if it becomes uncomfortable.  We might try placing a new wrap for you next week if it is helpful  You can also take 2 doses of furosemide for a few days.  I would suggest taking 1 first thing in 1 around lunchtime.  Take 10 mill equivalents of potassium to go with each pill of furosemide  Please keep me posted!

## 2023-03-16 DIAGNOSIS — I890 Lymphedema, not elsewhere classified: Secondary | ICD-10-CM | POA: Diagnosis not present

## 2023-03-16 DIAGNOSIS — M1612 Unilateral primary osteoarthritis, left hip: Secondary | ICD-10-CM | POA: Diagnosis not present

## 2023-03-17 DIAGNOSIS — Z96642 Presence of left artificial hip joint: Secondary | ICD-10-CM | POA: Diagnosis not present

## 2023-03-17 DIAGNOSIS — I890 Lymphedema, not elsewhere classified: Secondary | ICD-10-CM | POA: Diagnosis not present

## 2023-03-17 DIAGNOSIS — Z471 Aftercare following joint replacement surgery: Secondary | ICD-10-CM | POA: Diagnosis not present

## 2023-03-17 DIAGNOSIS — M79605 Pain in left leg: Secondary | ICD-10-CM | POA: Diagnosis not present

## 2023-03-17 DIAGNOSIS — R29898 Other symptoms and signs involving the musculoskeletal system: Secondary | ICD-10-CM | POA: Diagnosis not present

## 2023-03-23 ENCOUNTER — Ambulatory Visit: Payer: Medicare Other | Admitting: Psychology

## 2023-03-23 DIAGNOSIS — R29898 Other symptoms and signs involving the musculoskeletal system: Secondary | ICD-10-CM | POA: Diagnosis not present

## 2023-03-23 DIAGNOSIS — Z96642 Presence of left artificial hip joint: Secondary | ICD-10-CM | POA: Diagnosis not present

## 2023-03-23 DIAGNOSIS — M79605 Pain in left leg: Secondary | ICD-10-CM | POA: Diagnosis not present

## 2023-03-23 DIAGNOSIS — Z471 Aftercare following joint replacement surgery: Secondary | ICD-10-CM | POA: Diagnosis not present

## 2023-03-23 DIAGNOSIS — I890 Lymphedema, not elsewhere classified: Secondary | ICD-10-CM | POA: Diagnosis not present

## 2023-03-27 DIAGNOSIS — S40812A Abrasion of left upper arm, initial encounter: Secondary | ICD-10-CM | POA: Diagnosis not present

## 2023-03-27 DIAGNOSIS — W1830XA Fall on same level, unspecified, initial encounter: Secondary | ICD-10-CM | POA: Diagnosis not present

## 2023-03-27 DIAGNOSIS — R030 Elevated blood-pressure reading, without diagnosis of hypertension: Secondary | ICD-10-CM | POA: Diagnosis not present

## 2023-03-28 ENCOUNTER — Encounter: Payer: Self-pay | Admitting: Family Medicine

## 2023-03-31 ENCOUNTER — Ambulatory Visit (INDEPENDENT_AMBULATORY_CARE_PROVIDER_SITE_OTHER): Payer: Medicare Other | Admitting: Family Medicine

## 2023-03-31 ENCOUNTER — Encounter: Payer: Self-pay | Admitting: Family Medicine

## 2023-03-31 VITALS — BP 155/73 | HR 68 | Ht 64.0 in | Wt 159.0 lb

## 2023-03-31 DIAGNOSIS — T148XXA Other injury of unspecified body region, initial encounter: Secondary | ICD-10-CM | POA: Diagnosis not present

## 2023-03-31 MED ORDER — MUPIROCIN 2 % EX OINT
1.0000 | TOPICAL_OINTMENT | Freq: Two times a day (BID) | CUTANEOUS | 1 refills | Status: DC
Start: 2023-03-31 — End: 2023-09-04

## 2023-03-31 NOTE — Progress Notes (Signed)
   Acute Office Visit  Subjective:     Patient ID: Mary Buck, female    DOB: 07/21/1936, 87 y.o.   MRN: [id number]  Chief Complaint  Patient presents with   Wound Check     Patient is in today for wound.   Discussed the use of AI scribe software for clinical note transcription with the patient, who gave verbal consent to proceed.  History of Present Illness   The patient, with a recent history of hip replacement surgery, presented following a fall on concrete in a restaurant parking lot. The fall resulted in a significant abrasion to the left arm, which was initially managed at an urgent care center. The wound was cleaned and dressed with Xeroform.  Despite this, the patient reported significant wound drainage, necessitating multiple outer dressing changes but Xeroform was left in place. The drainage has reportedly decreased over time. The patient described the wound as being sore and painful, particularly during dressing changes.  They denied any significant injury to the recently replaced hip. The patient is not currently on any blood thinners, having only taken them for a month following the hip surgery in May.           ROS All review of systems negative except what is listed in the HPI      Objective:    BP (!) 155/73   Pulse 68   Ht 5\' 4"  (1.626 m)   Wt 159 lb (72.1 kg)   SpO2 98%   BMI 27.29 kg/m    Physical Exam Vitals reviewed.  Constitutional:      Appearance: Normal appearance.  Skin:    General: Skin is warm and dry.     Comments: See picture of left arm abrasion  Neurological:     Mental Status: She is alert and oriented to person, place, and time.  Psychiatric:        Mood and Affect: Mood normal.        Behavior: Behavior normal.        Thought Content: Thought content normal.        Judgment: Judgment normal.        No results found for any visits on 03/31/23.      Assessment & Plan:   Problem List Items Addressed This  Visit   None Visit Diagnoses     Abrasion    -  Primary Wound cleaned and dressed today.  Recommend cleaning with warm soapy water or saline, pat dry, apply mupirocin (twice daily), cover with non-adherent/gauze dressing. Patient aware of signs/symptoms requiring further/urgent evaluation.      Relevant Medications   mupirocin ointment (BACTROBAN) 2 %       Meds ordered this encounter  Medications   mupirocin ointment (BACTROBAN) 2 %    Sig: Apply 1 Application topically 2 (two) times daily.    Dispense:  22 g    Refill:  1    Order Specific Question:   Supervising Provider    Answer:   Danise Edge A [[id number]]    Return if symptoms worsen or fail to improve.  Clayborne Dana, NP

## 2023-04-06 ENCOUNTER — Encounter: Payer: Medicare Other | Attending: Psychology | Admitting: Psychology

## 2023-04-06 DIAGNOSIS — F331 Major depressive disorder, recurrent, moderate: Secondary | ICD-10-CM | POA: Diagnosis not present

## 2023-04-06 DIAGNOSIS — G4733 Obstructive sleep apnea (adult) (pediatric): Secondary | ICD-10-CM

## 2023-04-06 DIAGNOSIS — G894 Chronic pain syndrome: Secondary | ICD-10-CM

## 2023-04-07 ENCOUNTER — Encounter: Payer: Self-pay | Admitting: Psychology

## 2023-04-07 NOTE — Progress Notes (Signed)
Neuropsychology Visit  Patient:  Mary Buck   DOB: [birth year]  MR Number: [id number]  Location: [facility]  PHYSICAL MEDICINE & REHABILITATION [address] [place], [address] [place] [address] Dept: [phone]  Date of Service: 04/07/2023  Start: 4 PM End: 5 PM  Today's visit was an in person visit was conducted in my outpatient clinic office.  The patient myself were present.  Duration of Service: 1 Hour  Provider/Observer:     Hershal Coria PsyD  Chief Complaint:      Chief Complaint  Patient presents with   Depression   Pain   Back Pain    Reason For Service:     Mary Buck is an 87 year old female who has a history of anxiety and depressive symptomatology going back to adolescence.  Patient has had issues with insomnia since age 35 that were started around the time when she had an encounter with a snake and stayed up all night worrying that she had been bitten with a venomous bite despite absence of physical signs or symptoms.  OCD and anxiety have persisted throughout life.  Patient had a recent neuropsychological evaluation due to concerns of cognitive changes but did quite well on almost all neuropsychological measures and memory issues are likely related to anxiety and normal age-related changes.  The patient has been followed by Dr. Vella Kohler for psychotherapeutic interventions and after he left the practice she is following up with myself.  Treatment Interventions:  Cognitive/behavioral psychotherapeutic interventions or any issues related to generalized anxiety and coping and stress.  Participation Level:   Active  Participation Quality:  Appropriate      Behavioral Observation:  Well Groomed, Alert, and Appropriate.   Current Psychosocial Factors: Patient has done better as far as her motivation and getting things done but continues with considerable pain and limitations physically.  The patient  has planned hip replacement surgery which has caused some stress and worry about her recovery expectations.  Content of Session:   Reviewed current symptoms and worked on therapeutic interventions around issues of anxiety and depressive symptomatology and coping with various stressors in her life.  Effectiveness of Interventions: Report has begun to be established and the patient has been open and engaging throughout the therapeutic efforts and we are working on transitioning from previous psychologist.  Target Goals:   We continue to work issues related to coping skills around her anxiety and adjustment to significant changes in physical functioning.  Patient continues with significant back pain and has an appointment coming up with her orthopedist.  Patient has been diagnosed with obstructive sleep apnea but has not been able to tolerate a CPAP machine when she was initially in the fitting.  And will also be important to try to get the patient to give the CPAP device another effort as it may have a beneficial effect for her anxiety, memory and depressive symptomatology.  Goals Last Reviewed:   04/06/2023  Goals Addressed Today:    Today we continue to work on coping skills and strategies around anxiety and depressive types of symptoms and dealing with significant chronic pain issues and coping with aging.  Impression/Diagnosis:   Mary Buck. Mary Buck is an 87 year old female who has a history of anxiety and depressive symptomatology going back to adolescence.  Patient has had issues with insomnia since age 43 that were started around the time when she had an encounter with a snake and stayed up all night  worrying that she had been bitten with a venomous bite despite absence of physical signs or symptoms.  OCD and anxiety have persisted throughout life.  Patient had a recent neuropsychological evaluation due to concerns of cognitive changes but did quite well on almost all neuropsychological measures and  memory issues are likely related to anxiety and normal age-related changes.  The patient has been followed by Dr. Vella Kohler for psychotherapeutic interventions and after he left the practice she is following up with myself.  The patient has an upcoming appointment with Dr. Allena Katz who is an anesthesiologist about interventions that may be possible due to her chronic radicular lumbar pain symptoms.  Diagnosis:   Moderate episode of recurrent major depressive disorder (HCC)  Chronic pain syndrome  OSA (obstructive sleep apnea)    Arley Phenix, Psy.D. Clinical Psychologist Neuropsychologist

## 2023-04-13 DIAGNOSIS — Z96642 Presence of left artificial hip joint: Secondary | ICD-10-CM | POA: Diagnosis not present

## 2023-04-13 DIAGNOSIS — Z471 Aftercare following joint replacement surgery: Secondary | ICD-10-CM | POA: Diagnosis not present

## 2023-04-13 DIAGNOSIS — I890 Lymphedema, not elsewhere classified: Secondary | ICD-10-CM | POA: Diagnosis not present

## 2023-04-13 DIAGNOSIS — R29898 Other symptoms and signs involving the musculoskeletal system: Secondary | ICD-10-CM | POA: Diagnosis not present

## 2023-04-13 DIAGNOSIS — M79605 Pain in left leg: Secondary | ICD-10-CM | POA: Diagnosis not present

## 2023-04-13 NOTE — Progress Notes (Unsigned)
[facility] [address] [place], [place] [address] [phone]  Date:  04/15/2023   Name:  Mary Buck   DOB:  Feb 10, 1936   MRN:  [id number]  PCP:  Pearline Cables, MD    Chief Complaint: No chief complaint on file.   History of Present Illness:  Mary Buck is a 87 y.o. very pleasant female patient who presents with the following:  Patient seen today for follow-up Hip replacement in May of this year Most recent visit with myself was in June; that time we placed an Unna boot for concern of lower extremity edema In the interim, she suffered a fall.  She was seen at urgent care in [facility] on 7/12: 87 year old female alert. Patient states she was at [facility] walking in and fell on her left arm in the driveway. Did not hit her head, did not pass her out. She was assisted up and brought to the clinic. Patient has wound on upper left arm. She denies being on any blood thinners, denies being diabetic. Has not had any treatment. She is accompanied with a friend. States she is UTD with tetanus.   She was treated for a skin tear on the left upper arm  Seen a few days later for follow-up by my partner Mary Hopes, NP-all seem to be going okay at that time, she educated patient about wound care Patient Active Problem List   Diagnosis Date Noted   S/P hip replacement, left 01/20/2023   Thyroid disease    Heart murmur    GERD (gastroesophageal reflux disease)    Depression    Cataract    Cancer (HCC)    Asthma    Allergy    IDA (iron deficiency anemia) 05/31/2020   Osteopenia 05/30/2019   Pedal edema 01/31/2018   Squamous cell carcinoma of skin of left lower limb, including hip 12/16/2017   Plantar fasciitis 07/07/2017   DDD (degenerative disc disease), lumbar 06/01/2017   Fibromyalgia 06/01/2017   Spinal stenosis at L4-L5 level 04/30/2017   Peripheral neuropathy 04/30/2017   Encounter for therapeutic drug  monitoring 03/05/2017   Bladder cancer (HCC) 08/20/2015   Arthropathy of lumbar facet joint 07/27/2015   Spinal stenosis of cervical region 07/27/2015   Spondylolisthesis 07/27/2015   Hypothyroid 07/05/2015   Abdominal pain, acute, left upper quadrant 06/28/2015   Basal cell carcinoma, face 06/28/2015   Bilateral groin pain 06/28/2015   Chronic pain 06/28/2015   Fatigue 06/28/2015   Hyperlipidemia 06/28/2015   Bilateral hip pain 06/28/2015   Paresthesia of lower lip 06/28/2015   Pulmonary nodule 06/28/2015   Pustulosis palmaris et plantaris 06/28/2015   Ulcer of nose 06/28/2015   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   Mass of parotid gland 03/24/2014    Past Medical History:  Diagnosis Date   Allergy    Asthma    as teen   Cataract    DDD (degenerative disc disease), lumbar 06/01/2017   Depression    GERD (gastroesophageal reflux disease)    Heart murmur    no issues   Hyperlipidemia    Hypothyroidism    IDA (iron deficiency anemia) 05/31/2020   Peripheral neuropathy 04/30/2017   Skin cancer    left leg had radiation, bladder cancer   Sleep apnea    no CPAP   Thyroid disease     Past Surgical History:  Procedure Laterality Date   bladder cancer  2013  CARPAL TUNNEL RELEASE  2015   CATARACT EXTRACTION Bilateral 2012   CHOLECYSTECTOMY     COLONOSCOPY WITH ESOPHAGOGASTRODUODENOSCOPY (EGD)  02/22/2018   [facility]   SALIVARY GLAND SURGERY Left 2015   benign   SPINE SURGERY  6/31/21   vertaflex   TONSILLECTOMY AND ADENOIDECTOMY  1943   TOTAL HIP ARTHROPLASTY Left 01/20/2023   Procedure: TOTAL HIP ARTHROPLASTY;  Surgeon: Teryl Lucy, MD;  Location: [facility];  Service: Orthopedics;  Laterality: Left;    Social History   Tobacco Use   Smoking status: Former    Current packs/day: 0.00    Types: Cigarettes    Quit date: 2015    Years since quitting: 9.5   Smokeless tobacco: Never  Vaping Use   Vaping status: Never Used  Substance Use  Topics   Alcohol use: No   Drug use: No    Family History  Problem Relation Age of Onset   Macular degeneration Mother    Hyperlipidemia Father    Stroke Father    Cancer Brother        ? stomach   Prostate cancer Other        in brothers   Colon cancer Neg Hx     Allergies  Allergen Reactions   Contrast Media [Iodinated Contrast Media] Anaphylaxis   Oxycodone Anaphylaxis    "my throat swells with any opioid" (tolerated hydrocodone 01/21/23)   Latex Other (See Comments)    Other reaction(s): blistering, redness    Mirtazapine Nausea Only   Acitretin Rash   Cephalexin Other (See Comments)    Unknown (tolerated Ancef 01/21/23)   Elemental Sulfur Other (See Comments)    unknown   Penicillins Other (See Comments)    Unknown   Sulfa Antibiotics Other (See Comments)    Unknown    Medication list has been reviewed and updated.  Current Outpatient Medications on File Prior to Visit  Medication Sig Dispense Refill   Carboxymethylcellulose Sod PF (LUBRICANT EYE DROPS PF) 0.5 % SOLN Place 1 drop into both eyes 4 (four) times daily as needed (dry eyes).     Carboxymethylcellulose Sodium (REFRESH LIQUIGEL) 1 % GEL Place 1 drop into both eyes at bedtime.     famotidine (PEPCID) 20 MG tablet TAKE 1 TABLET(20 MG) BY MOUTH AT BEDTIME 90 tablet 3   gabapentin (NEURONTIN) 300 MG capsule Take 1 capsule (300 mg total) by mouth 3 (three) times daily. (Patient taking differently: Take 300 mg by mouth 2 (two) times daily before lunch and supper.) 90 capsule 4   lansoprazole (PREVACID) 30 MG capsule TAKE 1 CAPSULE(30 MG) BY MOUTH TWICE DAILY BEFORE A MEAL 180 capsule 1   levothyroxine (SYNTHROID) 88 MCG tablet Take 1 tablet (88 mcg total) by mouth daily before breakfast. 90 tablet 1   misoprostol (CYTOTEC) 100 MCG tablet TAKE 2 TABLETS BY MOUTH AFTER BREAKFAST AND AFTER DINNER AS DIRECTED 120 tablet 5   mupirocin ointment (BACTROBAN) 2 % Apply 1 Application topically 2 (two) times daily. 22 g 1    nystatin (MYCOSTATIN/NYSTOP) powder Apply 1 Application topically 3 (three) times daily. (Patient taking differently: Apply 1 Application topically daily.) 30 g 1   Probiotic Product (RISAQUAD PO) Take 1 capsule by mouth daily.     simvastatin (ZOCOR) 20 MG tablet TAKE 1 TABLET(20 MG) BY MOUTH DAILY 90 tablet 3   sodium chloride (MURO 128) 5 % ophthalmic solution Place 1 drop into both eyes at bedtime.     Vitamin  D-Vitamin K (VITAMIN K2-VITAMIN D3 PO) Take 1 tablet by mouth daily.     No current facility-administered medications on file prior to visit.    Review of Systems:  As per HPI- otherwise negative.   Physical Examination: There were no vitals filed for this visit. There were no vitals filed for this visit. There is no height or weight on file to calculate BMI. Ideal Body Weight:    GEN: no acute distress. HEENT: Atraumatic, Normocephalic.  Ears and Nose: No external deformity. CV: RRR, No M/G/R. No JVD. No thrill. No extra heart sounds. PULM: CTA B, no wheezes, crackles, rhonchi. No retractions. No resp. distress. No accessory muscle use. ABD: S, NT, ND, +BS. No rebound. No HSM. EXTR: No c/c/e PSYCH: Normally interactive. Conversant.    Assessment and Plan: ***  Signed Abbe Amsterdam, MD

## 2023-04-15 ENCOUNTER — Ambulatory Visit (HOSPITAL_BASED_OUTPATIENT_CLINIC_OR_DEPARTMENT_OTHER)
Admission: RE | Admit: 2023-04-15 | Discharge: 2023-04-15 | Disposition: A | Discharge status: Home / Self Care | Payer: Medicare Other | Source: Ambulatory Visit | Attending: Family Medicine | Admitting: Family Medicine

## 2023-04-15 ENCOUNTER — Ambulatory Visit (INDEPENDENT_AMBULATORY_CARE_PROVIDER_SITE_OTHER): Payer: Medicare Other | Admitting: Family Medicine

## 2023-04-15 ENCOUNTER — Encounter: Payer: Self-pay | Admitting: Family Medicine

## 2023-04-15 VITALS — BP 118/70 | HR 63 | Temp 98.3°F | Resp 18 | Ht 64.0 in | Wt 153.0 lb

## 2023-04-15 DIAGNOSIS — M79602 Pain in left arm: Secondary | ICD-10-CM | POA: Insufficient documentation

## 2023-04-15 DIAGNOSIS — M25522 Pain in left elbow: Secondary | ICD-10-CM | POA: Insufficient documentation

## 2023-04-15 DIAGNOSIS — S51012D Laceration without foreign body of left elbow, subsequent encounter: Secondary | ICD-10-CM | POA: Diagnosis not present

## 2023-04-15 DIAGNOSIS — W19XXXD Unspecified fall, subsequent encounter: Secondary | ICD-10-CM | POA: Insufficient documentation

## 2023-04-15 DIAGNOSIS — M799 Soft tissue disorder, unspecified: Secondary | ICD-10-CM | POA: Diagnosis not present

## 2023-04-15 DIAGNOSIS — R413 Other amnesia: Secondary | ICD-10-CM | POA: Diagnosis not present

## 2023-04-15 DIAGNOSIS — R634 Abnormal weight loss: Secondary | ICD-10-CM | POA: Diagnosis not present

## 2023-04-15 DIAGNOSIS — R2681 Unsteadiness on feet: Secondary | ICD-10-CM | POA: Diagnosis not present

## 2023-04-15 NOTE — Patient Instructions (Addendum)
The skin tear on your elbow looks good- as it continues to scab over you can start to leave it open to the air We will get x-rays of your left upper arm bone and your elbow  Let me know if this does not continue to heal up as expected   Try to get a bit more activity as you are able- a floor "pedal bike" may be helpful for you- once your arm is healed the pool is a good idea    I will reach out to your neurologist and ask her to bring you in for a follow-up visit

## 2023-04-20 DIAGNOSIS — M1612 Unilateral primary osteoarthritis, left hip: Secondary | ICD-10-CM | POA: Diagnosis not present

## 2023-04-22 DIAGNOSIS — I872 Venous insufficiency (chronic) (peripheral): Secondary | ICD-10-CM | POA: Diagnosis not present

## 2023-04-22 DIAGNOSIS — I8312 Varicose veins of left lower extremity with inflammation: Secondary | ICD-10-CM | POA: Diagnosis not present

## 2023-04-22 DIAGNOSIS — L905 Scar conditions and fibrosis of skin: Secondary | ICD-10-CM | POA: Diagnosis not present

## 2023-04-22 DIAGNOSIS — I8311 Varicose veins of right lower extremity with inflammation: Secondary | ICD-10-CM | POA: Diagnosis not present

## 2023-04-22 DIAGNOSIS — D692 Other nonthrombocytopenic purpura: Secondary | ICD-10-CM | POA: Diagnosis not present

## 2023-04-22 DIAGNOSIS — L821 Other seborrheic keratosis: Secondary | ICD-10-CM | POA: Diagnosis not present

## 2023-04-22 DIAGNOSIS — Z85828 Personal history of other malignant neoplasm of skin: Secondary | ICD-10-CM | POA: Diagnosis not present

## 2023-04-22 DIAGNOSIS — L820 Inflamed seborrheic keratosis: Secondary | ICD-10-CM | POA: Diagnosis not present

## 2023-04-23 ENCOUNTER — Encounter: Payer: Medicare Other | Attending: Psychology | Admitting: Psychology

## 2023-04-23 ENCOUNTER — Encounter: Payer: Self-pay | Admitting: Psychology

## 2023-04-23 DIAGNOSIS — F331 Major depressive disorder, recurrent, moderate: Secondary | ICD-10-CM | POA: Diagnosis not present

## 2023-04-23 DIAGNOSIS — F411 Generalized anxiety disorder: Secondary | ICD-10-CM | POA: Diagnosis not present

## 2023-04-23 DIAGNOSIS — G894 Chronic pain syndrome: Secondary | ICD-10-CM | POA: Diagnosis not present

## 2023-04-23 DIAGNOSIS — G4733 Obstructive sleep apnea (adult) (pediatric): Secondary | ICD-10-CM | POA: Diagnosis not present

## 2023-04-23 NOTE — Progress Notes (Signed)
Neuropsychology Visit  Patient:  Mary Buck   DOB: 1935/12/20  MR Number: [id number]  Location: [facility] [place] PHYSICAL MEDICINE & REHABILITATION [address] [place], [address] [place] [address] Dept: [phone]  Date of Service: 04/23/2023  Start: 2 PM End: 3 PM  Today's visit was an in person visit was conducted in my outpatient clinic office.  The patient myself were present.  Duration of Service: 1 Hour  Provider/Observer:     Hershal Coria PsyD  Chief Complaint:      Chief Complaint  Patient presents with   Anxiety   Depression   Back Pain   Pain   Sleeping Problem   Stress    Reason For Service:     Mary Buck is an 87 year old female who has a history of anxiety and depressive symptomatology going back to adolescence.  Patient has had issues with insomnia since age 54 that were started around the time when she had an encounter with a snake and stayed up all night worrying that she had been bitten with a venomous bite despite absence of physical signs or symptoms.  OCD and anxiety have persisted throughout life.  Patient had a recent neuropsychological evaluation due to concerns of cognitive changes but did quite well on almost all neuropsychological measures and memory issues are likely related to anxiety and normal age-related changes.  The patient has been followed by Dr. Vella Kohler for psychotherapeutic interventions and after he left the practice she is following up with myself.  Treatment Interventions:  Cognitive/behavioral psychotherapeutic interventions or any issues related to generalized anxiety and coping and stress.  Participation Level:   Active  Participation Quality:  Appropriate      Behavioral Observation:  Well Groomed, Alert, and Appropriate.   Current Psychosocial Factors: Patient has done better as far as her motivation and getting things done but continues with considerable pain  and limitations physically.  The patient has planned hip replacement surgery which has caused some stress and worry about her recovery expectations.  Content of Session:   Reviewed current symptoms and worked on therapeutic interventions around issues of anxiety and depressive symptomatology and coping with various stressors in her life.  Effectiveness of Interventions: Report has begun to be established and the patient has been open and engaging throughout the therapeutic efforts and we are working on transitioning from previous psychologist.  Target Goals:   We continue to work issues related to coping skills around her anxiety and adjustment to significant changes in physical functioning.  Patient continues with significant back pain and has an appointment coming up with her orthopedist.  Patient has been diagnosed with obstructive sleep apnea but has not been able to tolerate a CPAP machine when she was initially in the fitting.  And will also be important to try to get the patient to give the CPAP device another effort as it may have a beneficial effect for her anxiety, memory and depressive symptomatology.  Goals Last Reviewed:   04/23/2023  Goals Addressed Today:    Today we continue to work on coping skills and strategies around anxiety and depressive types of symptoms and dealing with significant chronic pain issues and coping with aging.  Impression/Diagnosis:   Mary Buck. [name] is an 87 year old female who has a history of anxiety and depressive symptomatology going back to adolescence.  Patient has had issues with insomnia since age 79 that were started around the time when she had  an encounter with a snake and stayed up all night worrying that she had been bitten with a venomous bite despite absence of physical signs or symptoms.  OCD and anxiety have persisted throughout life.  Patient had a recent neuropsychological evaluation due to concerns of cognitive changes but did quite well on  almost all neuropsychological measures and memory issues are likely related to anxiety and normal age-related changes.  The patient has been followed by Dr. Vella Kohler for psychotherapeutic interventions and after he left the practice she is following up with myself.  The patient has an upcoming appointment with Dr. Allena Katz who is an anesthesiologist about interventions that may be possible due to her chronic radicular lumbar pain symptoms.  Diagnosis:   Moderate episode of recurrent major depressive disorder (HCC)  OSA (obstructive sleep apnea)  Chronic pain syndrome  Generalized anxiety disorder    Mary Buck, Psy.D. Clinical Psychologist Neuropsychologist

## 2023-04-27 ENCOUNTER — Other Ambulatory Visit: Payer: Self-pay | Admitting: Physician Assistant

## 2023-04-28 ENCOUNTER — Telehealth: Payer: Self-pay | Admitting: Gastroenterology

## 2023-04-28 MED ORDER — MISOPROSTOL 100 MCG PO TABS: ORAL_TABLET | ORAL | 5 refills | Status: DC

## 2023-04-28 NOTE — Telephone Encounter (Signed)
Medication sent to the patient's pharmacy. Advised patient to keep her scheduled appointment with Dr. Barron Alvine. Patient verbalized understanding.

## 2023-04-28 NOTE — Telephone Encounter (Signed)
Inbound call from patient requesting a refill for misoprostol medication. Patient is scheduled for 11/8. Please advise, thank you.

## 2023-05-04 ENCOUNTER — Telehealth: Payer: Self-pay | Admitting: Family Medicine

## 2023-05-04 MED ORDER — FUROSEMIDE 20 MG PO TABS: 20.0000 mg | ORAL_TABLET | Freq: Every day | ORAL | 2 refills | Status: DC

## 2023-05-04 NOTE — Addendum Note (Signed)
Addended by: Abbe Amsterdam C on: 05/04/2023 08:41 PM   Modules accepted: Orders

## 2023-05-04 NOTE — Telephone Encounter (Signed)
Before refilling this, can you verify her dose as I remember she had been having issues with edema.

## 2023-05-04 NOTE — Telephone Encounter (Signed)
Prescription Request  05/04/2023  Is this a "Controlled Substance" medicine? No  LOV: 04/15/2023  What is the name of the medication or equipment? furosemide (LASIX) 20 MG tablet  Have you contacted your pharmacy to request a refill? No   Which pharmacy would you like this sent to?  [id number] - [place], [place] - [address] [place] [address] Phone: [phone] Fax: [phone]    Patient notified that their request is being sent to the clinical staff for review and that they should receive a response within 2 business days.   Please advise at [facility] [phone]

## 2023-05-05 DIAGNOSIS — Z471 Aftercare following joint replacement surgery: Secondary | ICD-10-CM | POA: Diagnosis not present

## 2023-05-05 DIAGNOSIS — R29898 Other symptoms and signs involving the musculoskeletal system: Secondary | ICD-10-CM | POA: Diagnosis not present

## 2023-05-05 DIAGNOSIS — Z96642 Presence of left artificial hip joint: Secondary | ICD-10-CM | POA: Diagnosis not present

## 2023-05-05 DIAGNOSIS — I890 Lymphedema, not elsewhere classified: Secondary | ICD-10-CM | POA: Diagnosis not present

## 2023-05-05 DIAGNOSIS — M79605 Pain in left leg: Secondary | ICD-10-CM | POA: Diagnosis not present

## 2023-05-12 DIAGNOSIS — C44622 Squamous cell carcinoma of skin of right upper limb, including shoulder: Secondary | ICD-10-CM | POA: Diagnosis not present

## 2023-05-12 DIAGNOSIS — Z85828 Personal history of other malignant neoplasm of skin: Secondary | ICD-10-CM | POA: Diagnosis not present

## 2023-05-12 DIAGNOSIS — L308 Other specified dermatitis: Secondary | ICD-10-CM | POA: Diagnosis not present

## 2023-05-12 DIAGNOSIS — D485 Neoplasm of uncertain behavior of skin: Secondary | ICD-10-CM | POA: Diagnosis not present

## 2023-05-14 ENCOUNTER — Encounter: Payer: Self-pay | Admitting: Neurology

## 2023-05-14 ENCOUNTER — Telehealth: Payer: Self-pay | Admitting: Neurology

## 2023-05-14 ENCOUNTER — Ambulatory Visit (INDEPENDENT_AMBULATORY_CARE_PROVIDER_SITE_OTHER): Payer: Medicare Other | Admitting: Neurology

## 2023-05-14 VITALS — BP 118/68 | Ht 63.0 in | Wt 156.0 lb

## 2023-05-14 DIAGNOSIS — R2689 Other abnormalities of gait and mobility: Secondary | ICD-10-CM

## 2023-05-14 DIAGNOSIS — R419 Unspecified symptoms and signs involving cognitive functions and awareness: Secondary | ICD-10-CM | POA: Diagnosis not present

## 2023-05-14 DIAGNOSIS — R269 Unspecified abnormalities of gait and mobility: Secondary | ICD-10-CM | POA: Diagnosis not present

## 2023-05-14 DIAGNOSIS — Z9181 History of falling: Secondary | ICD-10-CM | POA: Diagnosis not present

## 2023-05-14 NOTE — Progress Notes (Signed)
Subjective:    Patient ID: Mary Buck is a 87 y.o. female.  HPI    Interim history:   Mary Buck is an 87 year old right-handed woman with an underlying complex medical history of arthritis, status post hip replacement, lumbar spinal stenosis, history of bladder cancer, cervical spinal stenosis and degenerative spine disease, hypothyroidism, obesity, gait d/o, fibromyalgia, history of neuropathy, history of squamous cell cancer with status post radiation treatment to the left leg as well as treatment with acitretin, sleep apnea, and memory loss, who presents for FU consultation of her gait disorder and memory complaints. She is unaccompanied today and is referred by her PCP for re-evaluation. I last saw her on 07/31/2021, at which time she reported difficulty tolerating AutoPap, she reported daytime somnolence.  She was on gabapentin at the time.  She saw Shawnie Dapper, NP on 01/28/2022, at which time she was no longer on AutoPap therapy.  She had returned her machine.    Today, 05/14/2023: She reports that her balance is not good, she reports that her memory is not good.  She has been in outpatient physical therapy.  She was supposed to have home health physical therapy but had only 1 visit.  She uses a cane, has 2 types of walkers at home including a rollator with seat.  She feels that she does not have much physical strength. She does not hydrate well.  She drinks about 115 ounce serving of water per day and about 5 cups of coffee per day. She does not sleep very well.  She is still on gabapentin, 1 pill 3 times a day, would like to cut back, does not actually take it at bedtime, takes Tylenol 325 mg strength at bedtime which helps her leg pain.  She had recent blood work which I reviewed.  Vitamin B12 on 02/25/2023 was in the normal range but on the lower end of the spectrum at 329, TSH was within normal range at 4.35.  CMP showed benign findings, BUN was 14, creatinine 0.9, sodium 144,  potassium 4.5, alk phos 106, AST 15, ALT 9.  Of note, she had neuropsychological evaluation for cognitive testing in January 2022 which did not show any obvious concern for neurodegenerative disorder.  She has been seeing psychology for counseling.    She had a brain MRI without contrast in November 2021 which showed mild stable generalized cortical atrophy.  She had moderate chronic microvascular ischemic changes.    She had a recent visit with her primary care on 04/15/2023.  She reported a fall at the time, which happened in early July.  She reported weight loss.  She had been to physical therapy.  Of note, she continues to take gabapentin, currently 300 mg twice daily.    Previously (copied from previous notes for reference):      I saw her on 04/10/2021, at which time she reported feeling reluctant to start AutoPap therapy.  She used it 1 time at home and had a runny days that lasted for several days.  She had returned the machine.  She was encouraged to restart a trial of AutoPap therapy for her moderate obstructive sleep apnea.  A home sleep test in December 2021 had shown an AHI of 26.4/h, O2 nadir 77%.  She had reported short-term memory issues in November 2021.  She was advised that memory issues and sleep apnea can correlate.   Her set up date was 06/06/2021.  She has an air sense 11 AutoSet from [employer].  I reviewed her autoPap compliance data from 07/01/2021 through 07/30/2021, which is a total of 30 days, during which time she used her machine every night with percent used days greater than 4 hours at 70%, indicating adequate compliance with an average usage of 4 hours and 59 minutes, residual AHI at goal at 2.8/h, average pressure for the 95th percentile at 8.9 cm with a range of 4 cm to 10 cm, 95th percentile of pressure elevated at 37.5 L/min.    I saw her on 11/04/2020, at which time we talked about her sleep test results.  She had not started AutoPap therapy at the time.  She was in  behavioral therapy and counseling through neuropsychology.  She was also followed by pain management.         I saw her on 07/26/20, At which time she reported a several month history of short-term memory issues.  Her MMSE was 30 out of 30 at the time.  She was advised to proceed with additional evaluation in the form of brain MRI, sleep testing and neuropsychological evaluation.  Interim neuropsychological evaluation did not reveal any significant cognitive disorder.   She had a brain MRI without contrast on 08/02/2020 and I reviewed the results:   IMPRESSION: This MRI of the brain without contrast shows the following: 1.   Mild stable generalized cortical atrophy 2.   T2/flair hyperintense foci predominantly in the deep white matter most consistent with moderate chronic microvascular ischemic change.  None of the foci appear to be acute.     Her home sleep test on 08/28/2020 which indicated moderate obstructive sleep apnea with an AHI of 26.4/h, O2 nadir of 77%.  She was advised to start AutoPap therapy at home.     03/17/19: (She) reports worsening balance problems for the past few months.  Thankfully she has not fallen.  She fell in December 2018.  She reports forgetfulness and daytime sleepiness.  She takes tramadol 50 mg 3 times daily and also takes gabapentin, 300 mg twice daily during the day and 600 mg at night.  She denies a family history of Parkinson's disease.  She has wondered if she has Parkinson's-like changes.  She snores according to her partner.  She has never had a sleep study. She had a brain MRI without contrast on 04/05/2018 through [facility] and I reviewed the results:  IMPRESSION: Atrophy and chronic microvascular ischemic change in the white matter. No acute abnormality. She had a cervical spine and lumbar spine MRI without contrast in October 2016 when she was in [place].  I was able to review the report she brought: Study date was 07/09/2015: She had disc protrusion  at L2-3, prominent disc bulge and spondylitic changes at L4-5 with moderate bilateral neuroforaminal narrowing and mild bilateral lateral recess stenosis, at L5-S1 she had diffuse disc bulge and bony spinal changes causing moderate to marked bilateral neural foraminal narrowing without canal stenosis.  She had a prior lumbar spine MRI in April 2012 which was reference in this report.  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  She had no evidence of focal cord compression or abnormal cord signal at the time.  She follows with a pain management physician through [facility]. She is retired, she lives with  her partner, she quit smoking in July 2015, she does not drink alcohol, she likes to drink caffeine in the form of coffee, about 5 to 6 cups/day.  She does not drink a whole lot of water, estimates that she drinks about 16 ounces per day.  She has radiating lower back pain to the left leg.  She has an area of numbness in the left chin area where she received radiation therapy.    Her Past Medical History Is Significant For: Past Medical History:  Diagnosis Date   Allergy    Asthma    as teen   Cataract    DDD (degenerative disc disease), lumbar 06/01/2017   Depression    GERD (gastroesophageal reflux disease)    Heart murmur    no issues   Hyperlipidemia    Hypothyroidism    IDA (iron deficiency anemia) 05/31/2020   Peripheral neuropathy 04/30/2017   Skin cancer    left leg had radiation, bladder cancer   Sleep apnea    no CPAP   Thyroid disease     Her Past Surgical History Is Significant For: Past Surgical History:  Procedure Laterality Date   bladder cancer  2013   CARPAL TUNNEL RELEASE  2015    CATARACT EXTRACTION Bilateral 2012   CHOLECYSTECTOMY     COLONOSCOPY WITH ESOPHAGOGASTRODUODENOSCOPY (EGD)  02/22/2018   [facility]   SALIVARY GLAND SURGERY Left 2015   benign   SPINE SURGERY  6/31/21   vertaflex   TONSILLECTOMY AND ADENOIDECTOMY  1943   TOTAL HIP ARTHROPLASTY Left 01/20/2023   Procedure: TOTAL HIP ARTHROPLASTY;  Surgeon: Teryl Lucy, MD;  Location: [facility];  Service: Orthopedics;  Laterality: Left;    Her Family History Is Significant For: Family History  Problem Relation Age of Onset   Macular degeneration Mother    Hyperlipidemia Father    Stroke Father    Cancer Brother        ? stomach   Prostate cancer Other        in brothers   Colon cancer Neg Hx     Her Social History Is Significant For: Social History   Socioeconomic History   Marital status: Significant Other    Spouse name: Not on file   Number of children: 0   Years of education: 16   Highest education level: Not on file  Occupational History   Occupation: retired  Tobacco Use   Smoking status: Former    Current packs/day: 0.00    Types: Cigarettes    Quit date: 2015    Years since quitting: 9.6   Smokeless tobacco: Never  Vaping Use   Vaping status: Never Used  Substance and Sexual Activity   Alcohol use: No   Drug use: No   Sexual activity: Not Currently  Other Topics Concern   Not on file  Social History Narrative   Right handed   Married, 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   Caffeine-4-5 cups daily   Social Determinants of Health   Financial Resource Strain: Low Risk  (07/30/2021)   Overall Financial Resource Strain (CARDIA)    Difficulty of Paying Living Expenses: Not hard at all  Food Insecurity: No Food Insecurity (01/20/2023)   Hunger Vital Sign    Worried About Running Out of Food in the Last Year: Never true    Ran Out of Food in the Last Year: Never true  Transportation Needs: No Transportation Needs (01/20/2023)   PRAPARE -  Administrator, Civil Service (Medical): No    Lack of Transportation (Non-Medical): No  Physical Activity: Insufficiently Active (07/30/2021)   Exercise Vital Sign    Days of Exercise per Week: 7 days    Minutes of Exercise per Session: 20 min  Stress: Stress Concern Present (07/30/2021)   [employer] of Occupational Health - Occupational Stress Questionnaire    Feeling of Stress : To some extent  Social Connections: Unknown (01/27/2022)   Received from [facility]   Social Network    Social Network: Not on file    Her Allergies Are:  Allergies  Allergen Reactions   Contrast Media [Iodinated Contrast Media] Anaphylaxis   Oxycodone Anaphylaxis    "my throat swells with any opioid" (tolerated hydrocodone 01/21/23)   Latex Other (See Comments)    Other reaction(s): blistering, redness    Mirtazapine Nausea Only   Acitretin Rash   Cephalexin Other (See Comments)    Unknown (tolerated Ancef 01/21/23)   Elemental Sulfur Other (See Comments)    unknown   Penicillins Other (See Comments)    Unknown   Sulfa Antibiotics Other (See Comments)    Unknown  :   Her Current Medications Are:  Outpatient Encounter Medications as of 05/14/2023  Medication Sig   Carboxymethylcellulose Sod PF (LUBRICANT EYE DROPS PF) 0.5 % SOLN Place 1 drop into both eyes 4 (four) times daily as needed (dry eyes).   Carboxymethylcellulose Sodium (REFRESH LIQUIGEL) 1 % GEL Place 1 drop into both eyes at bedtime.   famotidine (PEPCID) 20 MG tablet TAKE 1 TABLET(20 MG) BY MOUTH AT BEDTIME   furosemide (LASIX) 20 MG tablet Take 1 tablet (20 mg total) by mouth daily.   gabapentin (NEURONTIN) 300 MG capsule Take 1 capsule (300 mg total) by mouth 3 (three) times daily. (Patient taking differently: Take 300 mg by mouth 2 (two) times daily before lunch and supper.)   lansoprazole (PREVACID) 30 MG capsule TAKE 1 CAPSULE(30 MG) BY MOUTH TWICE DAILY BEFORE A MEAL   levothyroxine (SYNTHROID) 88 MCG tablet  Take 1 tablet (88 mcg total) by mouth daily before breakfast.   misoprostol (CYTOTEC) 100 MCG tablet TAKE 2 TABLETS BY MOUTH AFTER BREAKFAST AND AFTER DINNER AS DIRECTED. Keep your scheduled appointment.   mupirocin ointment (BACTROBAN) 2 % Apply 1 Application topically 2 (two) times daily.   nystatin (MYCOSTATIN/NYSTOP) powder Apply 1 Application topically 3 (three) times daily. (Patient taking differently: Apply 1 Application topically daily.)   Probiotic Product (RISAQUAD PO) Take 1 capsule by mouth daily.   simvastatin (ZOCOR) 20 MG tablet TAKE 1 TABLET(20 MG) BY MOUTH DAILY   sodium chloride (MURO 128) 5 % ophthalmic solution Place 1 drop into both eyes at bedtime.   Vitamin D-Vitamin K (VITAMIN K2-VITAMIN D3 PO) Take 1 tablet by mouth daily.   No facility-administered encounter medications on file as of 05/14/2023.  :  Review of Systems:  Out of a complete 14 point review of systems, all are reviewed and negative with the exception of these symptoms as listed below:  Review of Systems  Neurological:        Rm 5, alone, patient reports issues with memory, balance, and gait concerns    Objective:  Neurological Exam  Physical Exam Physical Examination:   Vitals:   05/14/23 1315  BP: 118/68    General Examination: The patient is a very pleasant 87 y.o. female in no acute distress. She appears frail and deconditioned.  Well-groomed.    HEENT:  Normocephalic, atraumatic, pupils are equal, round and reactive to light, tracking is preserved. Hearing is grossly intact. Face is symmetric with normal facial animation. Speech is clear without dysarthria, hypophonia or voice tremor.  No carotid bruits.  Airway examination reveals moderate mouth dryness, dentures in place, stable findings with regard to moderate airway crowding.  She has a mild anterior flexion of her neck, stable.   Chest: Clear to auscultation without wheezing, rhonchi or crackles noted.   Heart: S1+S2+0, regular and  normal without murmurs, rubs or gallops noted.   Abdomen: Soft, non-tender and non-distended.   Extremities: There is no obvious edema. wearing long socks.    Skin: Warm and dry without trophic changes noted.   Musculoskeletal: exam reveals arthritic changes in both hands, stable.  Increased upper body curvature.   Neurologically: Mental status: The patient is awake, alert and pays good attention, able to provide her history.  Thought process is linear. Mood is normal and affect is normal.    (On 07/26/2020: MMSE: 30/30, AFT: 13/min.)   Cranial nerves II - XII are as described above under HEENT exam.   Motor exam: Normal bulk, strength and tone is noted. There is no obvious tremor, fine motor skills are grossly intact.   Cerebellar testing: No dysmetria or intention tremor. There is no truncal or gait ataxia. Sensory exam: intact to light touch. She stands without difficulty and walks with a cane mild insecurity with turns.  Stands slightly wide-based.  Increased upper body curvature noted.   Assessment and Plan:    In summary, Mary Buck is an 87 year old right-handed woman with an underlying complex medical history of arthritis, status post left hip replacement, lumbar spinal stenosis, history of bladder cancer, cervical spinal stenosis and degenerative spine disease, hypothyroidism, obesity, gait d/o, fibromyalgia, history of neuropathy, history of squamous cell cancer with status post radiation treatment to the left leg as well as treatment with acitretin, sleep apnea, and memory loss, who presents for FU consultation of her gait disorder and memory complaints. She is unaccompanied today and is referred by her PCP for re-evaluation.  Blood work in June did not show any abnormality with her thyroid but B12 was on the lower end of the spectrum.  We have previously talked about the challenges of balance issues in this clinic in our previous visits.  I think her memory complaints and  balance issues as well as gait disorder are all multifactorial in etiology. I recommended the following to her: I would like for her to discuss with her prescribing provider reduction of her gabapentin.  She admits that when she took it at bedtime it made her groggy and dizzy the next day.  She is at heightened risk for side effects from the gabapentin.   2. Unfortunately, untreated obstructive sleep apnea probably adds to her sleep disturbance and also memory complaints.  She currently does not wish to be treated for her obstructive sleep apnea.  3. She is at risk for dehydration, does not drink much in the way of water and she is encouraged to drink more water, limit her caffeine intake to about 2 servings per day.  4. She is encouraged to continue with physical therapy but use her walker for gait safety rather than her cane.  5.  I would like to order a brain MRI without contrast to look for any structural causes of her balance issues.  We can compare with the previous brain MRI.  We will call her with her test  result and plan of follow-up in this clinic accordingly.  For now, I recommend that she follow-up with her current providers as scheduled.   I answered all her questions today and she was in agreement with the plan. I spent 45 minutes in total face-to-face time and in reviewing records during pre-charting, more than 50% of which was spent in counseling and coordination of care, reviewing test results, reviewing medications and treatment regimen and/or in discussing or reviewing the diagnosis of memory complaints, gait disorder, the prognosis and treatment options. Pertinent laboratory and imaging test results that were available during this visit with the patient were reviewed by me and considered in my medical decision making (see chart for details).

## 2023-05-14 NOTE — Telephone Encounter (Signed)
medicare/cigna NPR sent to GI [id number]

## 2023-05-14 NOTE — Patient Instructions (Signed)
It was nice to see you again today.  Here is what I discussed with you today.   I would like for you to discuss with your prescribing provider a  reduction of the gabapentin as it increases your risk for balance problem, dizziness, sleepiness, and fall risk.   Unfortunately, untreated obstructive sleep apnea probably adds to your sleep disturbance and memory complaints.   I believe you are at risk for dehydration.  I would like for you to increase your water intake to about 6 cups of water and limit your coffee intake to about 2 servings per day.   Please use your walker at all times for gait safety.  Continue with physical therapy and follow their exercise recommendations to build your stamina. I will order a brain MRI without contrast to look for any structural causes of your balance issues.  We can compare with the previous brain MRI.  We will call you with the test results.  For now, please follow-up with your primary care and other providers.

## 2023-05-19 DIAGNOSIS — Z23 Encounter for immunization: Secondary | ICD-10-CM | POA: Diagnosis not present

## 2023-05-21 DIAGNOSIS — Z85828 Personal history of other malignant neoplasm of skin: Secondary | ICD-10-CM | POA: Diagnosis not present

## 2023-05-21 DIAGNOSIS — S80812A Abrasion, left lower leg, initial encounter: Secondary | ICD-10-CM | POA: Diagnosis not present

## 2023-05-21 DIAGNOSIS — L905 Scar conditions and fibrosis of skin: Secondary | ICD-10-CM | POA: Diagnosis not present

## 2023-05-26 ENCOUNTER — Other Ambulatory Visit: Payer: Self-pay | Admitting: Gastroenterology

## 2023-05-27 DIAGNOSIS — M48062 Spinal stenosis, lumbar region with neurogenic claudication: Secondary | ICD-10-CM | POA: Diagnosis not present

## 2023-05-27 DIAGNOSIS — Z79899 Other long term (current) drug therapy: Secondary | ICD-10-CM | POA: Diagnosis not present

## 2023-05-27 DIAGNOSIS — G894 Chronic pain syndrome: Secondary | ICD-10-CM | POA: Diagnosis not present

## 2023-05-27 DIAGNOSIS — M5136 Other intervertebral disc degeneration, lumbar region: Secondary | ICD-10-CM | POA: Diagnosis not present

## 2023-05-28 ENCOUNTER — Encounter: Payer: Medicare Other | Attending: Psychology | Admitting: Psychology

## 2023-05-28 DIAGNOSIS — G4733 Obstructive sleep apnea (adult) (pediatric): Secondary | ICD-10-CM | POA: Diagnosis not present

## 2023-05-28 DIAGNOSIS — G894 Chronic pain syndrome: Secondary | ICD-10-CM | POA: Diagnosis not present

## 2023-05-28 DIAGNOSIS — F411 Generalized anxiety disorder: Secondary | ICD-10-CM | POA: Diagnosis not present

## 2023-05-28 DIAGNOSIS — F331 Major depressive disorder, recurrent, moderate: Secondary | ICD-10-CM | POA: Diagnosis not present

## 2023-06-02 ENCOUNTER — Other Ambulatory Visit: Payer: Self-pay | Admitting: Family Medicine

## 2023-06-02 DIAGNOSIS — R29898 Other symptoms and signs involving the musculoskeletal system: Secondary | ICD-10-CM | POA: Diagnosis not present

## 2023-06-02 DIAGNOSIS — R600 Localized edema: Secondary | ICD-10-CM

## 2023-06-02 DIAGNOSIS — I890 Lymphedema, not elsewhere classified: Secondary | ICD-10-CM | POA: Diagnosis not present

## 2023-06-02 DIAGNOSIS — M79605 Pain in left leg: Secondary | ICD-10-CM | POA: Diagnosis not present

## 2023-06-02 DIAGNOSIS — Z471 Aftercare following joint replacement surgery: Secondary | ICD-10-CM | POA: Diagnosis not present

## 2023-06-02 DIAGNOSIS — Z96642 Presence of left artificial hip joint: Secondary | ICD-10-CM | POA: Diagnosis not present

## 2023-06-03 ENCOUNTER — Encounter: Payer: Self-pay | Admitting: Family Medicine

## 2023-06-03 DIAGNOSIS — R600 Localized edema: Secondary | ICD-10-CM

## 2023-06-03 MED ORDER — POTASSIUM CHLORIDE CRYS ER 10 MEQ PO TBCR
10.0000 meq | EXTENDED_RELEASE_TABLET | Freq: Every day | ORAL | 2 refills | Status: DC
Start: 2023-06-03 — End: 2023-09-04

## 2023-06-08 ENCOUNTER — Other Ambulatory Visit: Payer: Self-pay | Admitting: Family Medicine

## 2023-06-08 DIAGNOSIS — Z1231 Encounter for screening mammogram for malignant neoplasm of breast: Secondary | ICD-10-CM

## 2023-06-10 ENCOUNTER — Ambulatory Visit
Admission: RE | Admit: 2023-06-10 | Discharge: 2023-06-10 | Disposition: A | Discharge status: Home / Self Care | Payer: Medicare Other | Source: Ambulatory Visit | Attending: Neurology | Admitting: Neurology

## 2023-06-10 ENCOUNTER — Encounter: Payer: Self-pay | Admitting: Psychology

## 2023-06-10 DIAGNOSIS — R419 Unspecified symptoms and signs involving cognitive functions and awareness: Secondary | ICD-10-CM

## 2023-06-10 DIAGNOSIS — R2689 Other abnormalities of gait and mobility: Secondary | ICD-10-CM

## 2023-06-10 DIAGNOSIS — Z9181 History of falling: Secondary | ICD-10-CM | POA: Diagnosis not present

## 2023-06-10 DIAGNOSIS — R269 Unspecified abnormalities of gait and mobility: Secondary | ICD-10-CM | POA: Diagnosis not present

## 2023-06-10 NOTE — Progress Notes (Signed)
Neuropsychology Visit  Patient:  Mary Buck   DOB: 1935-10-26  MR Number: [id number]  Location: [facility] [place] [facility] [address] [place], [address] [place] [address] Dept: [phone]  Date of Service: 05/28/2023  Start: 2 PM End: 3 PM  Today's visit was an in person visit was conducted in my outpatient clinic office.  The patient myself were present.  Duration of Service: 1 Hour  Provider/Observer:     Hershal Coria PsyD  Chief Complaint:      Chief Complaint  Patient presents with   Anxiety   Depression   Back Pain   Pain   Sleeping Problem    Reason For Service:     Mary Buck is an 87 year old female who has a history of anxiety and depressive symptomatology going back to adolescence.  Patient has had issues with insomnia since age 16 that were started around the time when she had an encounter with a snake and stayed up all night worrying that she had been bitten with a venomous bite despite absence of physical signs or symptoms.  OCD and anxiety have persisted throughout life.  Patient had a recent neuropsychological evaluation due to concerns of cognitive changes but did quite well on almost all neuropsychological measures and memory issues are likely related to anxiety and normal age-related changes.  The patient has been followed by Dr. Vella Kohler for psychotherapeutic interventions and after he left the practice she is following up with myself.  Treatment Interventions:  Cognitive/behavioral psychotherapeutic interventions or any issues related to generalized anxiety and coping and stress.  Participation Level:   Active  Participation Quality:  Appropriate      Behavioral Observation:  Well Groomed, Alert, and Appropriate.   Current Psychosocial Factors: Patient has done better as far as her motivation and getting things done but continues with considerable pain and  limitations physically.  The patient has planned hip replacement surgery which has caused some stress and worry about her recovery expectations.  Content of Session:   Reviewed current symptoms and worked on therapeutic interventions around issues of anxiety and depressive symptomatology and coping with various stressors in her life.  Effectiveness of Interventions: Report has begun to be established and the patient has been open and engaging throughout the therapeutic efforts and we are working on transitioning from previous psychologist.  Target Goals:   We continue to work issues related to coping skills around her anxiety and adjustment to significant changes in physical functioning.  Patient continues with significant back pain and has an appointment coming up with her orthopedist.  Patient has been diagnosed with obstructive sleep apnea but has not been able to tolerate a CPAP machine when she was initially in the fitting.  And will also be important to try to get the patient to give the CPAP device another effort as it may have a beneficial effect for her anxiety, memory and depressive symptomatology.  Goals Last Reviewed:   05/28/2023  Goals Addressed Today:    Today we continue to work on coping skills and strategies around anxiety and depressive types of symptoms and dealing with significant chronic pain issues and coping with aging.  Impression/Diagnosis:   Mary Buck is an 87 year old female who has a history of anxiety and depressive symptomatology going back to adolescence.  Patient has had issues with insomnia since age 31 that were started around the time when she had an encounter with  a snake and stayed up all night worrying that she had been bitten with a venomous bite despite absence of physical signs or symptoms.  OCD and anxiety have persisted throughout life.  Patient had a recent neuropsychological evaluation due to concerns of cognitive changes but did quite well on almost  all neuropsychological measures and memory issues are likely related to anxiety and normal age-related changes.  The patient has been followed by Dr. Vella Kohler for psychotherapeutic interventions and after he left the practice she is following up with myself.  The patient has an upcoming appointment with Dr. Allena Katz who is an anesthesiologist about interventions that may be possible due to her chronic radicular lumbar pain symptoms.  Diagnosis:   Moderate episode of recurrent major depressive disorder (HCC)  OSA (obstructive sleep apnea)  Chronic pain syndrome  Generalized anxiety disorder    Arley Phenix, Psy.D. Clinical Psychologist Neuropsychologist

## 2023-06-16 ENCOUNTER — Encounter: Payer: Self-pay | Admitting: Family

## 2023-06-21 ENCOUNTER — Other Ambulatory Visit: Payer: Self-pay

## 2023-06-21 ENCOUNTER — Emergency Department (HOSPITAL_BASED_OUTPATIENT_CLINIC_OR_DEPARTMENT_OTHER): Payer: Medicare Other

## 2023-06-21 ENCOUNTER — Encounter (HOSPITAL_BASED_OUTPATIENT_CLINIC_OR_DEPARTMENT_OTHER): Payer: Self-pay

## 2023-06-21 ENCOUNTER — Emergency Department (HOSPITAL_BASED_OUTPATIENT_CLINIC_OR_DEPARTMENT_OTHER)
Admission: EM | Admit: 2023-06-21 | Discharge: 2023-06-21 | Disposition: A | Discharge status: Home / Self Care | Payer: Medicare Other | Attending: Emergency Medicine | Admitting: Emergency Medicine

## 2023-06-21 DIAGNOSIS — W010XXA Fall on same level from slipping, tripping and stumbling without subsequent striking against object, initial encounter: Secondary | ICD-10-CM | POA: Insufficient documentation

## 2023-06-21 DIAGNOSIS — I700 Atherosclerosis of aorta: Secondary | ICD-10-CM | POA: Diagnosis not present

## 2023-06-21 DIAGNOSIS — M79662 Pain in left lower leg: Secondary | ICD-10-CM | POA: Diagnosis not present

## 2023-06-21 DIAGNOSIS — Y92000 Kitchen of unspecified non-institutional (private) residence as  the place of occurrence of the external cause: Secondary | ICD-10-CM | POA: Diagnosis not present

## 2023-06-21 DIAGNOSIS — W19XXXA Unspecified fall, initial encounter: Secondary | ICD-10-CM

## 2023-06-21 DIAGNOSIS — M1712 Unilateral primary osteoarthritis, left knee: Secondary | ICD-10-CM | POA: Diagnosis not present

## 2023-06-21 DIAGNOSIS — M47812 Spondylosis without myelopathy or radiculopathy, cervical region: Secondary | ICD-10-CM | POA: Diagnosis not present

## 2023-06-21 DIAGNOSIS — Z8551 Personal history of malignant neoplasm of bladder: Secondary | ICD-10-CM | POA: Insufficient documentation

## 2023-06-21 DIAGNOSIS — M79602 Pain in left arm: Secondary | ICD-10-CM | POA: Diagnosis not present

## 2023-06-21 DIAGNOSIS — M4802 Spinal stenosis, cervical region: Secondary | ICD-10-CM | POA: Diagnosis not present

## 2023-06-21 DIAGNOSIS — R519 Headache, unspecified: Secondary | ICD-10-CM | POA: Insufficient documentation

## 2023-06-21 DIAGNOSIS — S0990XA Unspecified injury of head, initial encounter: Secondary | ICD-10-CM | POA: Diagnosis not present

## 2023-06-21 DIAGNOSIS — R0781 Pleurodynia: Secondary | ICD-10-CM | POA: Diagnosis not present

## 2023-06-21 DIAGNOSIS — M25562 Pain in left knee: Secondary | ICD-10-CM | POA: Diagnosis not present

## 2023-06-21 DIAGNOSIS — M25512 Pain in left shoulder: Secondary | ICD-10-CM | POA: Diagnosis not present

## 2023-06-21 DIAGNOSIS — M25561 Pain in right knee: Secondary | ICD-10-CM | POA: Insufficient documentation

## 2023-06-21 DIAGNOSIS — Z96642 Presence of left artificial hip joint: Secondary | ICD-10-CM | POA: Diagnosis not present

## 2023-06-21 DIAGNOSIS — M1711 Unilateral primary osteoarthritis, right knee: Secondary | ICD-10-CM | POA: Diagnosis not present

## 2023-06-21 DIAGNOSIS — M79622 Pain in left upper arm: Secondary | ICD-10-CM | POA: Insufficient documentation

## 2023-06-21 DIAGNOSIS — S199XXA Unspecified injury of neck, initial encounter: Secondary | ICD-10-CM | POA: Diagnosis not present

## 2023-06-21 DIAGNOSIS — M542 Cervicalgia: Secondary | ICD-10-CM | POA: Diagnosis not present

## 2023-06-21 DIAGNOSIS — M25559 Pain in unspecified hip: Secondary | ICD-10-CM | POA: Diagnosis not present

## 2023-06-21 DIAGNOSIS — M1611 Unilateral primary osteoarthritis, right hip: Secondary | ICD-10-CM | POA: Diagnosis not present

## 2023-06-21 NOTE — ED Provider Notes (Signed)
[facility] Provider Note   CSN: [id number] Arrival date & time: 06/21/23  1352    History  Chief Complaint  Patient presents with   Fall   Head Injury    Mary Buck is a 87 y.o. female past medical history significant for spinal stenosis, prior, prior bladder cancer, DDD here for evaluation of fall.  Patient states she was in the kitchen, went to grab a cup of coffee, turned quickly and slipped backwards.  Hit the posterior aspect of her head.  She has pain to the back of her left shoulder.  She has pain to bilateral knees however does not remember hurting her knees.  She denies LOC, anticoagulation. Ambulatory PTA.  She denies any headache prior to fall.  No chest pain, shortness of breath, abdominal pain, diarrhea, dysuria, numbness or weakness.  No bowel or bladder incontinence, no saddle paresthesia.  No Meds PTA.  HPI     Home Medications Prior to Admission medications   Medication Sig Start Date End Date Taking? Authorizing Provider  Carboxymethylcellulose Sod PF (LUBRICANT EYE DROPS PF) 0.5 % SOLN Place 1 drop into both eyes 4 (four) times daily as needed (dry eyes).    [provider]  Carboxymethylcellulose Sodium (REFRESH LIQUIGEL) 1 % GEL Place 1 drop into both eyes at bedtime.    [provider]  famotidine (PEPCID) 20 MG tablet TAKE 1 TABLET(20 MG) BY MOUTH AT BEDTIME 05/27/23   Cirigliano, Vito V, DO  furosemide (LASIX) 20 MG tablet Take 1 tablet (20 mg total) by mouth daily. 05/04/23   Copland, Gwenlyn Found, MD  gabapentin (NEURONTIN) 300 MG capsule Take 1 capsule (300 mg total) by mouth 3 (three) times daily. Patient taking differently: Take 300 mg by mouth 2 (two) times daily before lunch and supper. 04/30/17   Copland, Gwenlyn Found, MD  lansoprazole (PREVACID) 30 MG capsule TAKE 1 CAPSULE(30 MG) BY MOUTH TWICE DAILY BEFORE A MEAL 10/14/22   Copland, Gwenlyn Found, MD  levothyroxine (SYNTHROID) 88 MCG tablet Take  1 tablet (88 mcg total) by mouth daily before breakfast. 12/30/22   Copland, Gwenlyn Found, MD  misoprostol (CYTOTEC) 100 MCG tablet TAKE 2 TABLETS BY MOUTH AFTER BREAKFAST AND AFTER DINNER AS DIRECTED. Keep your scheduled appointment. 04/28/23   Cirigliano, Vito V, DO  mupirocin ointment (BACTROBAN) 2 % Apply 1 Application topically 2 (two) times daily. 03/31/23   Clayborne Dana, NP  nystatin (MYCOSTATIN/NYSTOP) powder Apply 1 Application topically 3 (three) times daily. Patient taking differently: Apply 1 Application topically daily. 04/09/22   Copland, Gwenlyn Found, MD  potassium chloride (KLOR-CON M) 10 MEQ tablet Take 1 tablet (10 mEq total) by mouth daily. Use on days you also take furosemide 06/03/23   Copland, Gwenlyn Found, MD  Probiotic Product (RISAQUAD PO) Take 1 capsule by mouth daily.    [provider]  simvastatin (ZOCOR) 20 MG tablet TAKE 1 TABLET(20 MG) BY MOUTH DAILY 12/31/22   Copland, Gwenlyn Found, MD  sodium chloride (MURO 128) 5 % ophthalmic solution Place 1 drop into both eyes at bedtime.    [provider]  Vitamin D-Vitamin K (VITAMIN K2-VITAMIN D3 PO) Take 1 tablet by mouth daily.    [provider]      Allergies    Contrast media [iodinated contrast media], Oxycodone, Latex, Mirtazapine, Acitretin, Cephalexin, Elemental sulfur, Penicillins, and Sulfa antibiotics    Review of Systems   Review of Systems  Constitutional: Negative.  HENT: Negative.    Respiratory: Negative.    Cardiovascular: Negative.   Musculoskeletal:  Positive for neck pain.       Neck pain, left shoulder pain, bilateral knee pain  Skin: Negative.   Neurological:  Positive for headaches.  All other systems reviewed and are negative.   Physical Exam Updated Vital Signs BP (!) 173/71 (BP Location: Right Arm)   Pulse 66   Temp 97.8 F (36.6 C) (Oral)   Resp 18   Ht 5\' 3"  (1.6 m)   Wt 70 kg   SpO2 100%   BMI 27.34 kg/m  Physical Exam Vitals and nursing note reviewed.   Constitutional:      General: She is not in acute distress.    Appearance: She is well-developed. She is not ill-appearing, toxic-appearing or diaphoretic.  HENT:     Head: Normocephalic and atraumatic.     Comments: Diffuse tenderness posterior bowel however no obvious hematoma.  No raccoon eye, Battle sign. Eyes:     Pupils: Pupils are equal, round, and reactive to light.  Neck:     Trachea: Trachea and phonation normal.     Comments: No midline cervical tenderness, tenderness bilateral trapezius.  Full range of motion of neck without difficulty Cardiovascular:     Rate and Rhythm: Normal rate.     Pulses: Normal pulses.          Radial pulses are 2+ on the right side and 2+ on the left side.     Heart sounds: Normal heart sounds.  Pulmonary:     Effort: Pulmonary effort is normal. No respiratory distress.     Breath sounds: Normal breath sounds and air entry.     Comments: Clear bilaterally, speaks in full sentences without difficulty Chest:     Comments: Nontender anterior posterior chest wall.  No crepitus or step-off Abdominal:     General: Bowel sounds are normal. There is no distension.     Palpations: Abdomen is soft.     Tenderness: There is no abdominal tenderness.     Comments: Soft, nontender.  Musculoskeletal:        General: Normal range of motion.     Cervical back: Full passive range of motion without pain and normal range of motion. Signs of trauma present. No rigidity, torticollis or crepitus. Muscular tenderness present. Normal range of motion.     Comments: No midline C/T/L tenderness.  Diffusely tender to left mid humerus however lifts bilateral arms overhead without difficulty.  Nontender bilateral forearm, hands, pelvis stable- nontender palpation.  Diffuse tenderness about bilateral anterior knees however able to flex and extend. Can straight keg raise wo difficulty. No obvious joint effusion. Tenderness left anterior tib-fib.  Able to plantarflex and  dorsiflex at feet without difficulty. Compartments soft  Skin:    General: Skin is warm and dry.     Capillary Refill: Capillary refill takes less than 2 seconds.     Comments: No contusions, abrasions, lacerations  Neurological:     General: No focal deficit present.     Mental Status: She is alert.     Cranial Nerves: Cranial nerves 2-12 are intact.     Sensory: Sensation is intact.     Motor: Motor function is intact.     Gait: Gait is intact.     Comments: Cranial nerves II through XII grossly intact Ambulatory without difficulty Equal strength bilateral upper and lower extremities  Psychiatric:        Mood and  Affect: Mood normal.     ED Results / Procedures / Treatments   Labs (all labs ordered are listed, but only abnormal results are displayed) Labs Reviewed - No data to display  EKG None  Radiology CT Cervical Spine Wo Contrast  Result Date: 06/21/2023 CLINICAL DATA:  Fall.  Neck trauma. EXAM: CT CERVICAL SPINE WITHOUT CONTRAST TECHNIQUE: Multidetector CT imaging of the cervical spine was performed without intravenous contrast. Multiplanar CT image reconstructions were also generated. RADIATION DOSE REDUCTION: This exam was performed according to the departmental dose-optimization program which includes automated exposure control, adjustment of the mA and/or kV according to patient size and/or use of iterative reconstruction technique. COMPARISON:  None Available. FINDINGS: Alignment: There is straightening of the normal cervical lordosis from C2 through C7. 2 mm grade 1 anterolisthesis of C7 on T1, likely degenerative in etiology. The facet joints are appropriately aligned. Skull base and vertebrae: The atlantodens interval is intact. Vertebral body heights are maintained. Mild C2-3, moderate posterior C3-4, moderate to severe posterior C4-5, severe diffuse C5-6, moderate diffuse C6-7, severe diffuse C7-T1, and moderate to severe T1-T2 and T2-T3 disc space narrowing.  Multilevel endplate sclerosis and anterior osteophytes, greatest at the C4-5 through C6-7 levels. No acute fracture. Soft tissues and spinal canal: No prevertebral fluid or swelling. No visible canal hematoma. Disc levels: Multilevel degenerative changes including disc space narrowing, uncovertebral hypertrophy, and facet joint hypertrophy contribute to mild-to-moderate left C2-3, severe right and moderate to severe left C3-4, severe right and moderate to severe left C4-5, severe left and moderate to severe right C5-6, moderate right and mild left C6-7, mild right C7-T1, moderate bilateral T1-T2, and mild right T3-4 neuroforaminal stenosis. Mild C4-5 central canal narrowing. Mild left C5-6 lateral recess narrowing. Upper chest: The lung apices are unremarkable. Other: No cervical chain lymphadenopathy. Moderate atherosclerotic calcifications within the visualized aortic arch and ascending great vessels. Moderate atherosclerotic calcifications within the bilateral carotid bulbs. IMPRESSION: 1. Minimal grade 1 anterolisthesis of C7 on T1, likely degenerative in etiology. 2. No acute fracture. 3. Multilevel degenerative changes as above. Aortic Atherosclerosis (ICD10-I70.0). Electronically Signed   By: Neita Garnet M.D.   On: 06/21/2023 17:38   CT Head Wo Contrast  Result Date: 06/21/2023 CLINICAL DATA:  Head trauma, moderate to severe. Head and neck injury. EXAM: CT HEAD WITHOUT CONTRAST TECHNIQUE: Contiguous axial images were obtained from the base of the skull through the vertex without intravenous contrast. RADIATION DOSE REDUCTION: This exam was performed according to the departmental dose-optimization program which includes automated exposure control, adjustment of the mA and/or kV according to patient size and/or use of iterative reconstruction technique. COMPARISON:  MRI brain 06/10/2023, CT brain 11/22/2019 FINDINGS: Brain: There is mild-to-moderate cortical atrophy, within normal limits for patient age.  The ventricles are normal in configuration. The basilar cisterns are patent. No mass, mass effect, or midline shift. No acute intracranial hemorrhage is seen. No abnormal extra-axial fluid collection. Mild-to-moderate periventricular and subcortical white matter patchy hypodensities are similar to prior, nonspecific but most likely secondary to chronic ischemic white matter changes. Otherwise, preservation of the normal cortical gray-white interface without CT evidence of an acute major vascular territorial cortical based infarction. Vascular: No hyperdense vessel or unexpected calcification. Skull: Normal. Negative for fracture or focal lesion. Sinuses/Orbits: Redemonstration of bilateral ocular lens replacements. The visualized paranasal sinuses and mastoid air cells are clear. Minimal anterior right maxillary sinus and posterolateral right maxillary sinus wall depression, healing of the previously seen fractures. Other: Metallic mandibular dental hardware  is noted. IMPRESSION: 1. No acute intracranial process. 2. Mild-to-moderate cortical atrophy and chronic ischemic white matter changes, similar to prior. Electronically Signed   By: Neita Garnet M.D.   On: 06/21/2023 17:29   DG Pelvis 1-2 Views  Result Date: 06/21/2023 CLINICAL DATA:  Pain after fall. EXAM: PELVIS - 1-2 VIEW COMPARISON:  None Available. FINDINGS: No pelvic fracture. Left hip arthroplasty is intact were visualized. There is mild right hip osteoarthritis. No pubic symphyseal or sacroiliac diastasis. IMPRESSION: No pelvic fracture. Electronically Signed   By: Narda Rutherford M.D.   On: 06/21/2023 16:19   DG Knee Complete 4 Views Left  Result Date: 06/21/2023 CLINICAL DATA:  Pain after fall. EXAM: LEFT KNEE - COMPLETE 4+ VIEW; LEFT TIBIA AND FIBULA - 2 VIEW COMPARISON:  None Available. FINDINGS: Knee: No evidence of acute fracture or dislocation. No knee joint effusion. Mild tricompartmental peripheral spurring. Small quadriceps tendon  enthesophyte. Mild soft tissue edema. Tibia/fibula: No acute fracture. The cortical margins are intact. Ankle alignment is maintained. Mild soft tissue edema. IMPRESSION: 1. No acute fracture left knee or tibia/fibula. 2. Mild knee osteoarthritis. Electronically Signed   By: Narda Rutherford M.D.   On: 06/21/2023 16:18   DG Tibia/Fibula Left  Result Date: 06/21/2023 CLINICAL DATA:  Pain after fall. EXAM: LEFT KNEE - COMPLETE 4+ VIEW; LEFT TIBIA AND FIBULA - 2 VIEW COMPARISON:  None Available. FINDINGS: Knee: No evidence of acute fracture or dislocation. No knee joint effusion. Mild tricompartmental peripheral spurring. Small quadriceps tendon enthesophyte. Mild soft tissue edema. Tibia/fibula: No acute fracture. The cortical margins are intact. Ankle alignment is maintained. Mild soft tissue edema. IMPRESSION: 1. No acute fracture left knee or tibia/fibula. 2. Mild knee osteoarthritis. Electronically Signed   By: Narda Rutherford M.D.   On: 06/21/2023 16:18   DG Knee Complete 4 Views Right  Result Date: 06/21/2023 CLINICAL DATA:  Pain after fall. EXAM: RIGHT KNEE - COMPLETE 4+ VIEW COMPARISON:  None Available. FINDINGS: No evidence of fracture, dislocation, or joint effusion. Small quadriceps tendon enthesophyte. Minor degenerative change, less than typically seen for age. Mild soft tissue edema. IMPRESSION: No fracture or subluxation of the right knee. Mild soft tissue edema. Electronically Signed   By: Narda Rutherford M.D.   On: 06/21/2023 16:17   DG Ribs Bilateral W/Chest  Result Date: 06/21/2023 CLINICAL DATA:  Pain after fall. EXAM: BILATERAL RIBS AND CHEST - 4+ VIEW COMPARISON:  02/20/2023 FINDINGS: No fracture or other bone lesions are seen involving the ribs. There is no evidence of pneumothorax or pleural effusion. Both lungs are clear. Heart is normal in size. Aortic atherosclerosis and tortuosity. IMPRESSION: No rib fracture or acute chest finding. Electronically Signed   By: Narda Rutherford  M.D.   On: 06/21/2023 16:17   DG Humerus Left  Result Date: 06/21/2023 CLINICAL DATA:  Slip and fall.  Left shoulder/arm pain. EXAM: LEFT HUMERUS - 2+ VIEW COMPARISON:  None Available. FINDINGS: There is no evidence of fracture or other focal bone lesions. Cortical margins of the humerus are intact. Shoulder and elbow alignment are maintained. Soft tissues are unremarkable. IMPRESSION: Negative radiographs of the left humerus. Electronically Signed   By: Narda Rutherford M.D.   On: 06/21/2023 16:14    Procedures Procedures    Medications Ordered in ED Medications - No data to display  ED Course/ Medical Decision Making/ A&P   23 old here for evaluation mechanical fall which occurred earlier today.  Ambulatory prior to arrival.  Neurovascularly intact.  She has some diffuse tenderness to her bilateral knees, left humerus, left tib-fib, head and neck.  She has no obvious dislocations on exam.  Her heart and lungs are clear.  Abdomen soft, tender. NV intact wo deficits. She is nontender chest wall.  No midline C/T/L tenderness.  She denies any presyncope or syncopal symptoms. Will plan on imaging and reassess.  She does not anything for pain at this time  Imaging personally viewed and interpreted:  No acute abnormality  Patient reassessed.  Does not need anything for pain.  She is ambulatory, neurologically intact intake.  Will have her follow-up outpatient, return for any worsening symptoms.  The patient has been appropriately medically screened and/or stabilized in the ED. I have low suspicion for any other emergent medical condition which would require further screening, evaluation or treatment in the ED or require inpatient management.  Patient is hemodynamically stable and in no acute distress.  Patient able to ambulate in department prior to ED.  Evaluation does not show acute pathology that would require ongoing or additional emergent interventions while in the emergency department or  further inpatient treatment.  I have discussed the diagnosis with the patient and answered all questions.  Pain is been managed while in the emergency department and patient has no further complaints prior to discharge.  Patient is comfortable with plan discussed in room and is stable for discharge at this time.  I have discussed strict return precautions for returning to the emergency department.  Patient was encouraged to follow-up with PCP/specialist refer to at discharge.                                  Medical Decision Making Amount and/or Complexity of Data Reviewed Independent Historian: friend External Data Reviewed: labs, radiology and notes. Radiology: ordered and independent interpretation performed. Decision-making details documented in ED Course.  Risk OTC drugs. Prescription drug management. Decision regarding hospitalization. Diagnosis or treatment significantly limited by social determinants of health.           Final Clinical Impression(s) / ED Diagnoses Final diagnoses:  Fall, initial encounter    Rx / DC Orders ED Discharge Orders     None         Yuliana Vandrunen A, PA-C 06/21/23 1749    Virgina Norfolk, DO 06/22/23 [phone]

## 2023-06-21 NOTE — Discharge Instructions (Signed)
Tylenol and Motrin for pain  Imaging did not show any new findings  Follow up outpatient. Return for new or worsening symptoms

## 2023-06-21 NOTE — ED Triage Notes (Signed)
The patient slipped and fell. She did hit her head but denied LOC. She denied blood thinner use. She is having shoulder and neck pain.

## 2023-06-22 ENCOUNTER — Encounter: Payer: Medicare Other | Attending: Psychology | Admitting: Psychology

## 2023-06-22 ENCOUNTER — Encounter: Payer: Self-pay | Admitting: Psychology

## 2023-06-22 DIAGNOSIS — F411 Generalized anxiety disorder: Secondary | ICD-10-CM | POA: Insufficient documentation

## 2023-06-22 DIAGNOSIS — G894 Chronic pain syndrome: Secondary | ICD-10-CM | POA: Diagnosis not present

## 2023-06-22 DIAGNOSIS — G479 Sleep disorder, unspecified: Secondary | ICD-10-CM | POA: Insufficient documentation

## 2023-06-22 DIAGNOSIS — G4733 Obstructive sleep apnea (adult) (pediatric): Secondary | ICD-10-CM | POA: Insufficient documentation

## 2023-06-22 DIAGNOSIS — F331 Major depressive disorder, recurrent, moderate: Secondary | ICD-10-CM | POA: Insufficient documentation

## 2023-06-22 NOTE — Progress Notes (Signed)
Neuropsychology Visit  Patient:  Mary Buck   DOB: 1936-01-20  MR Number: [id number]  Location: [facility] [place] [facility] [address] [place], [address] [place] [address] Dept: [phone]  Date of Service: 06/22/2023  Start: 1 PM End: 2PM  Today's visit was an in person visit was conducted in my outpatient clinic office.  The patient myself were present.  Duration of Service: 1 Hour  Provider/Observer:     Hershal Coria PsyD  Chief Complaint:      Chief Complaint  Patient presents with   Anxiety   Depression   Pain   Back Pain   Sleeping Problem   Fall    Reason For Service:     Mary Buck is an 87 year old female who has a history of anxiety and depressive symptomatology going back to adolescence.  Patient has had issues with insomnia since age 42 that were started around the time when she had an encounter with a snake and stayed up all night worrying that she had been bitten with a venomous bite despite absence of physical signs or symptoms.  OCD and anxiety have persisted throughout life.  Patient had a recent neuropsychological evaluation due to concerns of cognitive changes but did quite well on almost all neuropsychological measures and memory issues are likely related to anxiety and normal age-related changes.  The patient has been followed by Dr. Vella Kohler for psychotherapeutic interventions and after he left the practice she is following up with myself.  Treatment Interventions:  Cognitive/behavioral psychotherapeutic interventions or any issues related to generalized anxiety and coping and stress.  Participation Level:   Active  Participation Quality:  Appropriate      Behavioral Observation:  Well Groomed, Alert, and Appropriate.   Current Psychosocial Factors: Patient reports that she had a fall yesterday and spent about 4 hours in [facility] ED having a CT scan  and other x-rays with ultimately no broken bones etc.  The patient reports that she has had a previous fall now reports that she was getting a cup of coffee and turning from the counter when she fell spilling coffee on her cell phone all over the floor.  The patient reports that this is part of her mobility issues and she does regularly use her cane.  We did address these factors with recommendations about avoiding falls particularly when carrying something as they tend to increase risk of fall.  Content of Session:   Reviewed current symptoms and worked on therapeutic interventions around issues of anxiety and depressive symptomatology and coping with various stressors in her life.  Effectiveness of Interventions: Report has begun to be established and the patient has been open and engaging throughout the therapeutic efforts and we are working on transitioning from previous psychologist.  Target Goals:   We continue to work issues related to coping skills around her anxiety and adjustment to significant changes in physical functioning.  Patient continues with significant back pain and has an appointment coming up with her orthopedist.  Patient has been diagnosed with obstructive sleep apnea but has not been able to tolerate a CPAP machine when she was initially in the fitting.  And will also be important to try to get the patient to give the CPAP device another effort as it may have a beneficial effect for her anxiety, memory and depressive symptomatology.  Goals Last Reviewed:   06/22/2023  Goals Addressed Today:  Today we continue to work on coping skills and strategies around anxiety and depressive types of symptoms and dealing with significant chronic pain issues and coping with aging.  Impression/Diagnosis:   Mary Buck is an 87 year old female who has a history of anxiety and depressive symptomatology going back to adolescence.  Patient has had issues with insomnia since age 62 that were  started around the time when she had an encounter with a snake and stayed up all night worrying that she had been bitten with a venomous bite despite absence of physical signs or symptoms.  OCD and anxiety have persisted throughout life.  Patient had a recent neuropsychological evaluation due to concerns of cognitive changes but did quite well on almost all neuropsychological measures and memory issues are likely related to anxiety and normal age-related changes.  The patient has been followed by Dr. Vella Kohler for psychotherapeutic interventions and after he left the practice she is following up with myself.  Patient had a fall yesterday and has had more recent fall as well.  Diagnosis:   Moderate episode of recurrent major depressive disorder (HCC)  OSA (obstructive sleep apnea)  Chronic pain syndrome  Generalized anxiety disorder  Sleep disturbance    Arley Phenix, Psy.D. Clinical Psychologist Neuropsychologist

## 2023-06-25 DIAGNOSIS — G894 Chronic pain syndrome: Secondary | ICD-10-CM | POA: Diagnosis not present

## 2023-06-25 DIAGNOSIS — M51362 Other intervertebral disc degeneration, lumbar region with discogenic back pain and lower extremity pain: Secondary | ICD-10-CM | POA: Diagnosis not present

## 2023-06-25 DIAGNOSIS — M48062 Spinal stenosis, lumbar region with neurogenic claudication: Secondary | ICD-10-CM | POA: Diagnosis not present

## 2023-06-28 ENCOUNTER — Other Ambulatory Visit: Payer: Self-pay | Admitting: Family Medicine

## 2023-06-30 DIAGNOSIS — Z23 Encounter for immunization: Secondary | ICD-10-CM | POA: Diagnosis not present

## 2023-07-01 DIAGNOSIS — C44722 Squamous cell carcinoma of skin of right lower limb, including hip: Secondary | ICD-10-CM | POA: Diagnosis not present

## 2023-07-01 DIAGNOSIS — I872 Venous insufficiency (chronic) (peripheral): Secondary | ICD-10-CM | POA: Diagnosis not present

## 2023-07-01 DIAGNOSIS — Z85828 Personal history of other malignant neoplasm of skin: Secondary | ICD-10-CM | POA: Diagnosis not present

## 2023-07-01 DIAGNOSIS — I8312 Varicose veins of left lower extremity with inflammation: Secondary | ICD-10-CM | POA: Diagnosis not present

## 2023-07-01 DIAGNOSIS — L570 Actinic keratosis: Secondary | ICD-10-CM | POA: Diagnosis not present

## 2023-07-01 DIAGNOSIS — C44729 Squamous cell carcinoma of skin of left lower limb, including hip: Secondary | ICD-10-CM | POA: Diagnosis not present

## 2023-07-01 DIAGNOSIS — I8311 Varicose veins of right lower extremity with inflammation: Secondary | ICD-10-CM | POA: Diagnosis not present

## 2023-07-02 ENCOUNTER — Other Ambulatory Visit: Payer: Self-pay | Admitting: Family Medicine

## 2023-07-02 DIAGNOSIS — K219 Gastro-esophageal reflux disease without esophagitis: Secondary | ICD-10-CM

## 2023-07-07 DIAGNOSIS — H353131 Nonexudative age-related macular degeneration, bilateral, early dry stage: Secondary | ICD-10-CM | POA: Diagnosis not present

## 2023-07-07 DIAGNOSIS — H04123 Dry eye syndrome of bilateral lacrimal glands: Secondary | ICD-10-CM | POA: Diagnosis not present

## 2023-07-07 DIAGNOSIS — H35373 Puckering of macula, bilateral: Secondary | ICD-10-CM | POA: Diagnosis not present

## 2023-07-07 DIAGNOSIS — H52203 Unspecified astigmatism, bilateral: Secondary | ICD-10-CM | POA: Diagnosis not present

## 2023-07-07 DIAGNOSIS — H43813 Vitreous degeneration, bilateral: Secondary | ICD-10-CM | POA: Diagnosis not present

## 2023-07-07 DIAGNOSIS — H5203 Hypermetropia, bilateral: Secondary | ICD-10-CM | POA: Diagnosis not present

## 2023-07-07 DIAGNOSIS — H02204 Unspecified lagophthalmos left upper eyelid: Secondary | ICD-10-CM | POA: Diagnosis not present

## 2023-07-07 DIAGNOSIS — H18513 Endothelial corneal dystrophy, bilateral: Secondary | ICD-10-CM | POA: Diagnosis not present

## 2023-07-07 DIAGNOSIS — H02201 Unspecified lagophthalmos right upper eyelid: Secondary | ICD-10-CM | POA: Diagnosis not present

## 2023-07-07 DIAGNOSIS — H524 Presbyopia: Secondary | ICD-10-CM | POA: Diagnosis not present

## 2023-07-07 NOTE — Patient Instructions (Incomplete)
It was good to see you again today  Ok to stop lasix and potassium if you like If you are getting a lot of leg swelling or any difficulty breathing please let me know!    Please contact your dentist regarding the painful area in your mouth

## 2023-07-07 NOTE — Progress Notes (Unsigned)
[facility] [address], [address] [place], [place] [address] [phone]  Date:  07/08/2023   Name:  Mary Buck   DOB:  12/06/1935   MRN:  [id number]  PCP:  Pearline Cables, MD    Chief Complaint: No chief complaint on file.   History of Present Illness:  Mary Buck is a 87 y.o. very pleasant female patient who presents with the following:  Patient seen today with concern of a swollen gland Most recent visit with myself was in July after she had a fall. She has history of osteopenia, degenerative disc disease in her back and spinal stenosis, bladder cancer, fairly extensive lower extremity skin cancer, hypothyroidism, hyperlipidemia, asthma and allergies   Colon cancer screening-with patient permission will discontinue She had a colonoscopy in 2019 which appears benign Flu shot Patient Active Problem List   Diagnosis Date Noted   S/P hip replacement, left 01/20/2023   Thyroid disease    Heart murmur    GERD (gastroesophageal reflux disease)    Depression    Cataract    Cancer (HCC)    Asthma    Allergy    IDA (iron deficiency anemia) 05/31/2020   Osteopenia 05/30/2019   Pedal edema 01/31/2018   Squamous cell carcinoma of skin of left lower limb, including hip 12/16/2017   Plantar fasciitis 07/07/2017   DDD (degenerative disc disease), lumbar 06/01/2017   Fibromyalgia 06/01/2017   Spinal stenosis at L4-L5 level 04/30/2017   Peripheral neuropathy 04/30/2017   Encounter for therapeutic drug monitoring 03/05/2017   Bladder cancer (HCC) 08/20/2015   Arthropathy of lumbar facet joint 07/27/2015   Spinal stenosis of cervical region 07/27/2015   Spondylolisthesis 07/27/2015   Hypothyroid 07/05/2015   Abdominal pain, acute, left upper quadrant 06/28/2015   Basal cell carcinoma, face 06/28/2015   Bilateral groin pain 06/28/2015   Chronic pain 06/28/2015   Fatigue 06/28/2015   Hyperlipidemia  06/28/2015   Bilateral hip pain 06/28/2015   Paresthesia of lower lip 06/28/2015   Pulmonary nodule 06/28/2015   Pustulosis palmaris et plantaris 06/28/2015   Ulcer of nose 06/28/2015   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   Mass of parotid gland 03/24/2014    Past Medical History:  Diagnosis Date   Allergy    Asthma    as teen   Cataract    DDD (degenerative disc disease), lumbar 06/01/2017   Depression    GERD (gastroesophageal reflux disease)    Heart murmur    no issues   Hyperlipidemia    Hypothyroidism    IDA (iron deficiency anemia) 05/31/2020   Peripheral neuropathy 04/30/2017   Skin cancer    left leg had radiation, bladder cancer   Sleep apnea    no CPAP   Thyroid disease     Past Surgical History:  Procedure Laterality Date   bladder cancer  2013   CARPAL TUNNEL RELEASE  2015   CATARACT EXTRACTION Bilateral 2012   CHOLECYSTECTOMY     COLONOSCOPY WITH ESOPHAGOGASTRODUODENOSCOPY (EGD)  02/22/2018   [facility]   SALIVARY GLAND SURGERY Left 2015   benign   SPINE SURGERY  6/31/21   vertaflex   TONSILLECTOMY AND ADENOIDECTOMY  1943   TOTAL HIP ARTHROPLASTY Left 01/20/2023   Procedure: TOTAL HIP ARTHROPLASTY;  Surgeon: Teryl Lucy, MD;  Location: [facility];  Service: Orthopedics;  Laterality: Left;    Social History   Tobacco Use   Smoking status:  Former    Current packs/day: 0.00    Types: Cigarettes    Quit date: 2015    Years since quitting: 9.8   Smokeless tobacco: Never  Vaping Use   Vaping status: Never Used  Substance Use Topics   Alcohol use: No   Drug use: No    Family History  Problem Relation Age of Onset   Macular degeneration Mother    Hyperlipidemia Father    Stroke Father    Cancer Brother        ? stomach   Prostate cancer Other        in brothers   Colon cancer Neg Hx     Allergies  Allergen Reactions   Contrast Media [Iodinated Contrast Media] Anaphylaxis   Oxycodone Anaphylaxis    "my throat swells  with any opioid" (tolerated hydrocodone 01/21/23)   Latex Other (See Comments)    Other reaction(s): blistering, redness    Mirtazapine Nausea Only   Acitretin Rash   Cephalexin Other (See Comments)    Unknown (tolerated Ancef 01/21/23)   Elemental Sulfur Other (See Comments)    unknown   Penicillins Other (See Comments)    Unknown   Sulfa Antibiotics Other (See Comments)    Unknown    Medication list has been reviewed and updated.  Current Outpatient Medications on File Prior to Visit  Medication Sig Dispense Refill   Carboxymethylcellulose Sod PF (LUBRICANT EYE DROPS PF) 0.5 % SOLN Place 1 drop into both eyes 4 (four) times daily as needed (dry eyes).     Carboxymethylcellulose Sodium (REFRESH LIQUIGEL) 1 % GEL Place 1 drop into both eyes at bedtime.     famotidine (PEPCID) 20 MG tablet TAKE 1 TABLET(20 MG) BY MOUTH AT BEDTIME 90 tablet 0   furosemide (LASIX) 20 MG tablet Take 1 tablet (20 mg total) by mouth daily. 90 tablet 2   gabapentin (NEURONTIN) 300 MG capsule Take 1 capsule (300 mg total) by mouth 3 (three) times daily. (Patient taking differently: Take 300 mg by mouth 2 (two) times daily before lunch and supper.) 90 capsule 4   lansoprazole (PREVACID) 30 MG capsule TAKE 1 CAPSULE(30 MG) BY MOUTH TWICE DAILY BEFORE A MEAL 180 capsule 1   levothyroxine (SYNTHROID) 88 MCG tablet Take 1 tablet (88 mcg total) by mouth daily before breakfast. 90 tablet 1   misoprostol (CYTOTEC) 100 MCG tablet TAKE 2 TABLETS BY MOUTH AFTER BREAKFAST AND AFTER DINNER AS DIRECTED. Keep your scheduled appointment. 120 tablet 5   mupirocin ointment (BACTROBAN) 2 % Apply 1 Application topically 2 (two) times daily. 22 g 1   nystatin (MYCOSTATIN/NYSTOP) powder Apply 1 Application topically 3 (three) times daily. (Patient taking differently: Apply 1 Application topically daily.) 30 g 1   potassium chloride (KLOR-CON M) 10 MEQ tablet Take 1 tablet (10 mEq total) by mouth daily. Use on days you also take  furosemide 90 tablet 2   Probiotic Product (RISAQUAD PO) Take 1 capsule by mouth daily.     simvastatin (ZOCOR) 20 MG tablet TAKE 1 TABLET(20 MG) BY MOUTH DAILY 90 tablet 3   sodium chloride (MURO 128) 5 % ophthalmic solution Place 1 drop into both eyes at bedtime.     Vitamin D-Vitamin K (VITAMIN K2-VITAMIN D3 PO) Take 1 tablet by mouth daily.     No current facility-administered medications on file prior to visit.    Review of Systems:  As per HPI- otherwise negative.   Physical Examination: There were no vitals filed for  this visit. There were no vitals filed for this visit. There is no height or weight on file to calculate BMI. Ideal Body Weight:    GEN: no acute distress. HEENT: Atraumatic, Normocephalic.  Ears and Nose: No external deformity. CV: RRR, No M/G/R. No JVD. No thrill. No extra heart sounds. PULM: CTA B, no wheezes, crackles, rhonchi. No retractions. No resp. distress. No accessory muscle use. ABD: S, NT, ND, +BS. No rebound. No HSM. EXTR: No c/c/e PSYCH: Normally interactive. Conversant.    Assessment and Plan: ***  Signed Abbe Amsterdam, MD

## 2023-07-08 ENCOUNTER — Ambulatory Visit (INDEPENDENT_AMBULATORY_CARE_PROVIDER_SITE_OTHER): Payer: Medicare Other | Admitting: Family Medicine

## 2023-07-08 ENCOUNTER — Encounter: Payer: Self-pay | Admitting: Family Medicine

## 2023-07-08 VITALS — BP 136/78 | HR 62 | Resp 18 | Ht 63.0 in | Wt 154.0 lb

## 2023-07-08 DIAGNOSIS — K1379 Other lesions of oral mucosa: Secondary | ICD-10-CM

## 2023-07-08 DIAGNOSIS — Z5181 Encounter for therapeutic drug level monitoring: Secondary | ICD-10-CM | POA: Diagnosis not present

## 2023-07-08 DIAGNOSIS — H9190 Unspecified hearing loss, unspecified ear: Secondary | ICD-10-CM

## 2023-07-22 DIAGNOSIS — M1611 Unilateral primary osteoarthritis, right hip: Secondary | ICD-10-CM | POA: Diagnosis not present

## 2023-07-22 DIAGNOSIS — M5431 Sciatica, right side: Secondary | ICD-10-CM | POA: Diagnosis not present

## 2023-07-24 ENCOUNTER — Ambulatory Visit: Payer: Medicare Other | Admitting: Gastroenterology

## 2023-07-24 ENCOUNTER — Telehealth: Payer: Self-pay | Admitting: Gastroenterology

## 2023-07-24 NOTE — Telephone Encounter (Signed)
Please see note below.  Dr. Barron Alvine pt

## 2023-07-24 NOTE — Telephone Encounter (Signed)
Noted  

## 2023-07-24 NOTE — Telephone Encounter (Signed)
PT called and cancelled appointment for today. She went to the [facility] office not knowing he had moved to [facility]

## 2023-07-27 ENCOUNTER — Encounter: Payer: Medicare Other | Attending: Psychology | Admitting: Psychology

## 2023-07-27 DIAGNOSIS — F331 Major depressive disorder, recurrent, moderate: Secondary | ICD-10-CM | POA: Insufficient documentation

## 2023-07-27 DIAGNOSIS — F411 Generalized anxiety disorder: Secondary | ICD-10-CM | POA: Insufficient documentation

## 2023-07-27 DIAGNOSIS — M79605 Pain in left leg: Secondary | ICD-10-CM | POA: Insufficient documentation

## 2023-07-27 DIAGNOSIS — G479 Sleep disorder, unspecified: Secondary | ICD-10-CM | POA: Insufficient documentation

## 2023-07-27 DIAGNOSIS — G4733 Obstructive sleep apnea (adult) (pediatric): Secondary | ICD-10-CM | POA: Diagnosis not present

## 2023-07-27 DIAGNOSIS — M79604 Pain in right leg: Secondary | ICD-10-CM | POA: Insufficient documentation

## 2023-07-27 DIAGNOSIS — G894 Chronic pain syndrome: Secondary | ICD-10-CM | POA: Diagnosis not present

## 2023-07-28 ENCOUNTER — Encounter: Payer: Self-pay | Admitting: Psychology

## 2023-07-28 ENCOUNTER — Telehealth: Payer: Self-pay | Admitting: Audiologist

## 2023-07-28 NOTE — Telephone Encounter (Signed)
Patient walked in stating she had an appointment for Audiology.  There was a referral where the facility had called the patient but no appt had been made.  Patient states she wanted to speak with provider and that someone messed up her appointment.  Advised provider was in treating patients, Scheduled for next available provided patient with appointment reminder letter.

## 2023-07-28 NOTE — Progress Notes (Addendum)
Neuropsychology Visit  Patient:  Mary Buck   DOB: [birth year]  MR Number: [id number]  Location: [facility] OF [facility] [address] [place], [place] [address] [place] [address] Dept: [phone]  Date of Service: 07/28/2023   Start: 1 PM End: 2 PM  Today's visit was an in person visit was conducted in my outpatient clinic office.  The patient myself were present.  Duration of Service: 1 Hour  Provider/Observer:     Hershal Coria PsyD  Chief Complaint:      Chief Complaint  Patient presents with   Depression   Pain   Stress    Reason For Service:     Mary Buck is an 87 year old female who has a history of anxiety and depressive symptomatology going back to adolescence.  Patient has had issues with insomnia since age 15 that were started around the time when she had an encounter with a snake and stayed up all night worrying that she had been bitten with a venomous bite despite absence of physical signs or symptoms.  OCD and anxiety have persisted throughout life.  Patient had a recent neuropsychological evaluation due to concerns of cognitive changes but did quite well on almost all neuropsychological measures and memory issues are likely related to anxiety and normal age-related changes.  The patient has been followed by Dr. Vella Kohler for psychotherapeutic interventions and after he left the practice she is following up with myself.  Treatment Interventions:  Cognitive/behavioral psychotherapeutic interventions or any issues related to generalized anxiety and coping and stress.  Participation Level:   Active  Participation Quality:  Appropriate      Behavioral Observation:  Well Groomed, Alert, and Appropriate.   Current Psychosocial Factors: Patient continues with some significant stress primarily caused by chronic pain symptoms.  Content of Session:   Reviewed  current symptoms and worked on therapeutic interventions around issues of anxiety and depressive symptomatology and coping with various stressors in her life.  Effectiveness of Interventions: Report has begun to be established and the patient has been open and engaging throughout the therapeutic efforts and we are working on transitioning from previous psychologist.  Target Goals:   We continue to work issues related to coping skills around her anxiety and adjustment to significant changes in physical functioning.  Patient continues with significant back pain and has an appointment coming up with her orthopedist.  Patient has been diagnosed with obstructive sleep apnea but has not been able to tolerate a CPAP machine when she was initially in the fitting.  And will also be important to try to get the patient to give the CPAP device another effort as it may have a beneficial effect for her anxiety, memory and depressive symptomatology.  Goals Last Reviewed:   07/28/2023  Goals Addressed Today:    Today we continue to work on coping skills and strategies around anxiety and depressive types of symptoms and dealing with significant chronic pain issues and coping with aging.  Impression/Diagnosis:   Mary Wier. Buck is an 87 year old female who has a history of anxiety and depressive symptomatology going back to adolescence.  Patient has had issues with insomnia since age 28 that were started around the time when she had an encounter with a snake and stayed up all night worrying that she had been bitten with a venomous bite despite absence of physical signs or symptoms.  OCD  and anxiety have persisted throughout life.  Patient had a recent neuropsychological evaluation due to concerns of cognitive changes but did quite well on almost all neuropsychological measures and memory issues are likely related to anxiety and normal age-related changes.  The patient has been followed by Dr. Vella Kohler for psychotherapeutic  interventions and after he left the practice she is following up with myself.  Patient had a fall yesterday and has had more recent fall as well.  Diagnosis:   Moderate episode of recurrent major depressive disorder (HCC)  OSA (obstructive sleep apnea)  Chronic pain syndrome  Generalized anxiety disorder  Sleep disturbance  Pain in both lower extremities    Arley Phenix, Psy.D. Clinical Psychologist Neuropsychologist

## 2023-07-29 DIAGNOSIS — C679 Malignant neoplasm of bladder, unspecified: Secondary | ICD-10-CM | POA: Diagnosis not present

## 2023-08-03 ENCOUNTER — Ambulatory Visit: Payer: Medicare Other

## 2023-08-06 DIAGNOSIS — Z85828 Personal history of other malignant neoplasm of skin: Secondary | ICD-10-CM | POA: Diagnosis not present

## 2023-08-06 DIAGNOSIS — D485 Neoplasm of uncertain behavior of skin: Secondary | ICD-10-CM | POA: Diagnosis not present

## 2023-08-06 DIAGNOSIS — L570 Actinic keratosis: Secondary | ICD-10-CM | POA: Diagnosis not present

## 2023-08-06 DIAGNOSIS — C44722 Squamous cell carcinoma of skin of right lower limb, including hip: Secondary | ICD-10-CM | POA: Diagnosis not present

## 2023-08-07 ENCOUNTER — Encounter: Payer: Self-pay | Admitting: Family Medicine

## 2023-08-17 ENCOUNTER — Telehealth: Payer: Self-pay | Admitting: Family Medicine

## 2023-08-17 NOTE — Telephone Encounter (Signed)
Lvm to discuss symptoms further so we can triage her if necessary

## 2023-08-19 DIAGNOSIS — M1611 Unilateral primary osteoarthritis, right hip: Secondary | ICD-10-CM | POA: Diagnosis not present

## 2023-08-20 ENCOUNTER — Encounter: Payer: Medicare Other | Attending: Psychology | Admitting: Psychology

## 2023-08-20 DIAGNOSIS — G894 Chronic pain syndrome: Secondary | ICD-10-CM | POA: Diagnosis not present

## 2023-08-20 DIAGNOSIS — G4733 Obstructive sleep apnea (adult) (pediatric): Secondary | ICD-10-CM | POA: Diagnosis not present

## 2023-08-20 DIAGNOSIS — F331 Major depressive disorder, recurrent, moderate: Secondary | ICD-10-CM | POA: Diagnosis not present

## 2023-08-20 DIAGNOSIS — F411 Generalized anxiety disorder: Secondary | ICD-10-CM | POA: Insufficient documentation

## 2023-08-23 NOTE — Progress Notes (Addendum)
[facility] [address] [place], [place] [address] [phone]  Date:  08/26/2023   Name:  Mary Buck   DOB:  08-Jul-1936   MRN:  [id number]  PCP:  Pearline Cables, MD    Chief Complaint: Gait Problem   History of Present Illness:  Mary Buck is a 87 y.o. very pleasant female patient who presents with the following:  Patient seen today with concerns about her balance Most recent visit with myself was in October She has history of osteopenia, degenerative disc disease in her back and spinal stenosis, bladder cancer, fairly extensive lower extremity skin cancer, hypothyroidism, hyperlipidemia, asthma and allergies   At her last visit Maedot noted her legs felt chronically stiff and tired.  She had been discharged from physical therapy due to maximal improvement-I encouraged her to enroll in an exercise program like [employer] sne[employer]  Wt Readings from Last 3 Encounters:  08/26/23 151 lb 3.2 oz (68.6 kg)  07/08/23 154 lb (69.9 kg)  06/21/23 154 lb 5.2 oz (70 kg)   Weight one year ago 162lbs  She is down about 10lbs in a year She notes that she is eating less overall - she generally eats just twice a day.  Sometimes she just does not feel like eating   She notes her partner Lenard Galloway is sick with a cough for several days- this is also wearing Mary Buck out!   Pt notes when she is standing up working at her kitchen counter for example she will feel like her knees buckle She has not actually fallen Walking she does ok-This only seems to happen if she is standing still  This has been going on for a few weeks She does have history of spinal stenosis She does note some BLE weakness and cramping   We are trying to avoid knee surgery  She also feels like the "skin on my skull is very tight"   Patient Active Problem List   Diagnosis Date Noted   S/P hip replacement, left 01/20/2023   Thyroid disease     Heart murmur    GERD (gastroesophageal reflux disease)    Depression    Cataract    Cancer (HCC)    Asthma    Allergy    IDA (iron deficiency anemia) 05/31/2020   Osteopenia 05/30/2019   Pedal edema 01/31/2018   Squamous cell carcinoma of skin of left lower limb, including hip 12/16/2017   Plantar fasciitis 07/07/2017   DDD (degenerative disc disease), lumbar 06/01/2017   Fibromyalgia 06/01/2017   Spinal stenosis at L4-L5 level 04/30/2017   Peripheral neuropathy 04/30/2017   Encounter for therapeutic drug monitoring 03/05/2017   Bladder cancer (HCC) 08/20/2015   Arthropathy of lumbar facet joint 07/27/2015   Spinal stenosis of cervical region 07/27/2015   Spondylolisthesis 07/27/2015   Hypothyroid 07/05/2015   Abdominal pain, acute, left upper quadrant 06/28/2015   Basal cell carcinoma, face 06/28/2015   Bilateral groin pain 06/28/2015   Chronic pain 06/28/2015   Fatigue 06/28/2015   Hyperlipidemia 06/28/2015   Bilateral hip pain 06/28/2015   Paresthesia of lower lip 06/28/2015   Pulmonary nodule 06/28/2015   Pustulosis palmaris et plantaris 06/28/2015   Ulcer of nose 06/28/2015   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   Mass of parotid gland 03/24/2014    Past Medical History:  Diagnosis Date   Allergy    Asthma    as teen   Cataract  DDD (degenerative disc disease), lumbar 06/01/2017   Depression    GERD (gastroesophageal reflux disease)    Heart murmur    no issues   Hyperlipidemia    Hypothyroidism    IDA (iron deficiency anemia) 05/31/2020   Peripheral neuropathy 04/30/2017   Skin cancer    left leg had radiation, bladder cancer   Sleep apnea    no CPAP   Thyroid disease     Past Surgical History:  Procedure Laterality Date   bladder cancer  2013   CARPAL TUNNEL RELEASE  2015   CATARACT EXTRACTION Bilateral 2012   CHOLECYSTECTOMY     COLONOSCOPY WITH ESOPHAGOGASTRODUODENOSCOPY (EGD)  02/22/2018   [facility]   SALIVARY GLAND  SURGERY Left 2015   benign   SPINE SURGERY  6/31/21   vertaflex   TONSILLECTOMY AND ADENOIDECTOMY  1943   TOTAL HIP ARTHROPLASTY Left 01/20/2023   Procedure: TOTAL HIP ARTHROPLASTY;  Surgeon: Teryl Lucy, MD;  Location: [facility];  Service: Orthopedics;  Laterality: Left;    Social History   Tobacco Use   Smoking status: Former    Current packs/day: 0.00    Types: Cigarettes    Quit date: 2015    Years since quitting: 9.9   Smokeless tobacco: Never  Vaping Use   Vaping status: Never Used  Substance Use Topics   Alcohol use: No   Drug use: No    Family History  Problem Relation Age of Onset   Macular degeneration Mother    Hyperlipidemia Father    Stroke Father    Cancer Brother        ? stomach   Prostate cancer Other        in brothers   Colon cancer Neg Hx     Allergies  Allergen Reactions   Contrast Media [Iodinated Contrast Media] Anaphylaxis   Oxycodone Anaphylaxis    "my throat swells with any opioid" (tolerated hydrocodone 01/21/23)   Latex Other (See Comments)    Other reaction(s): blistering, redness    Mirtazapine Nausea Only   Acitretin Rash   Cephalexin Other (See Comments)    Unknown (tolerated Ancef 01/21/23)   Elemental Sulfur Other (See Comments)    unknown   Penicillins Other (See Comments)    Unknown   Sulfa Antibiotics Other (See Comments)    Unknown    Medication list has been reviewed and updated.  Current Outpatient Medications on File Prior to Visit  Medication Sig Dispense Refill   Carboxymethylcellulose Sod PF (LUBRICANT EYE DROPS PF) 0.5 % SOLN Place 1 drop into both eyes 4 (four) times daily as needed (dry eyes).     Carboxymethylcellulose Sodium (REFRESH LIQUIGEL) 1 % GEL Place 1 drop into both eyes at bedtime.     famotidine (PEPCID) 20 MG tablet TAKE 1 TABLET(20 MG) BY MOUTH AT BEDTIME 90 tablet 0   furosemide (LASIX) 20 MG tablet Take 1 tablet (20 mg total) by mouth daily. 90 tablet 2   gabapentin (NEURONTIN) 300 MG capsule Take  1 capsule (300 mg total) by mouth 3 (three) times daily. (Patient taking differently: Take 300 mg by mouth 2 (two) times daily before lunch and supper.) 90 capsule 4   lansoprazole (PREVACID) 30 MG capsule TAKE 1 CAPSULE(30 MG) BY MOUTH TWICE DAILY BEFORE A MEAL 180 capsule 1   levothyroxine (SYNTHROID) 88 MCG tablet Take 1 tablet (88 mcg total) by mouth daily before breakfast. 90 tablet 1   misoprostol (CYTOTEC) 100 MCG tablet TAKE  2 TABLETS BY MOUTH AFTER BREAKFAST AND AFTER DINNER AS DIRECTED. Keep your scheduled appointment. 120 tablet 5   mupirocin ointment (BACTROBAN) 2 % Apply 1 Application topically 2 (two) times daily. 22 g 1   nystatin (MYCOSTATIN/NYSTOP) powder Apply 1 Application topically 3 (three) times daily. (Patient taking differently: Apply 1 Application topically daily.) 30 g 1   potassium chloride (KLOR-CON M) 10 MEQ tablet Take 1 tablet (10 mEq total) by mouth daily. Use on days you also take furosemide 90 tablet 2   Probiotic Product (RISAQUAD PO) Take 1 capsule by mouth daily.     simvastatin (ZOCOR) 20 MG tablet TAKE 1 TABLET(20 MG) BY MOUTH DAILY 90 tablet 3   sodium chloride (MURO 128) 5 % ophthalmic solution Place 1 drop into both eyes at bedtime.     Vitamin D-Vitamin K (VITAMIN K2-VITAMIN D3 PO) Take 1 tablet by mouth daily.     No current facility-administered medications on file prior to visit.    Review of Systems:  As per HPI- otherwise negative.   Physical Examination: Vitals:   08/26/23 1455  BP: 132/82  Pulse: 75  Resp: 18  Temp: 97.8 F (36.6 C)  SpO2: 98%   Vitals:   08/26/23 1455  Weight: 151 lb 3.2 oz (68.6 kg)  Height: 5\' 3"  (1.6 m)   Body mass index is 26.78 kg/m. Ideal Body Weight: Weight in (lb) to have BMI = 25: 140.8  GEN: no acute distress.  Normal weight, looks well  HEENT: Atraumatic, Normocephalic.  Ears and Nose: No external deformity. CV: RRR, No M/G/R. No JVD. No thrill. No extra heart sounds. PULM: CTA B, no wheezes,  crackles, rhonchi. No retractions. No resp. distress. No accessory muscle use. ABD: S, NT, ND, +BS. No rebound. No HSM. EXTR: No c/c/e PSYCH: Normally interactive. Conversant.  Good pulses in both feet  Bilateral quad, hamstring, calf, hip flexor strength is normal Skin on her scalp appears normal Assessment and Plan: Weight loss - Plan: CBC, Comprehensive metabolic panel, TSH, CT CHEST ABDOMEN PELVIS WO CONTRAST  Encounter for follow-up examination after completed treatment for malignant neoplasm - Plan: CT CHEST ABDOMEN PELVIS WO CONTRAST  Patient seen today with a couple of concerns.  Her biggest issue is weight loss.  She does have history of bladder cancer and widespread lower extremity skin cancer.  We discussed options, decided to go ahead with a CT chest, abdomen, pelvis.  Of note she does have a contrast allergy so we will have to do a noncontrasted scan  Advised I am not sure why the skin on her scalp feels tight.  Exam is normal  We wonder if her sensation of legs buckling when standing for prolonged periods may have to do with her spinal stenosis.  She is not have any numbness or tingling, circulation is good.  She would rather not look at this further right now  Signed Abbe Amsterdam, MD  Addendum 12/13, received labs as below.  Message to patient  Results for orders placed or performed in visit on 08/26/23  CBC   Collection Time: 08/26/23  3:18 PM  Result Value Ref Range   WBC 6.9 4.0 - 10.5 K/uL   RBC 3.89 3.87 - 5.11 Mil/uL   Platelets 274.0 150.0 - 400.0 K/uL   Hemoglobin 12.2 12.0 - 15.0 g/dL   HCT 16.1 09.6 - 04.5 %   MCV 96.2 78.0 - 100.0 fl   MCHC 32.6 30.0 - 36.0 g/dL   RDW 40.9 81.1 -  15.5 %  Comprehensive metabolic panel   Collection Time: 08/26/23  3:18 PM  Result Value Ref Range   Sodium 144 135 - 145 mEq/L   Potassium 4.8 3.5 - 5.1 mEq/L   Chloride 104 96 - 112 mEq/L   CO2 34 (H) 19 - 32 mEq/L   Glucose, Bld 104 (H) 70 - 99 mg/dL   BUN 15 6 - 23  mg/dL   Creatinine, Ser 1.61 0.40 - 1.20 mg/dL   Total Bilirubin 0.4 0.2 - 1.2 mg/dL   Alkaline Phosphatase 66 39 - 117 U/L   AST 18 0 - 37 U/L   ALT 12 0 - 35 U/L   Total Protein 6.6 6.0 - 8.3 g/dL   Albumin 3.8 3.5 - 5.2 g/dL   GFR 09.60 (L) >45.40 mL/min   Calcium 8.8 8.4 - 10.5 mg/dL  TSH   Collection Time: 08/26/23  3:18 PM  Result Value Ref Range   TSH 2.37 0.35 - 5.50 uIU/mL

## 2023-08-25 ENCOUNTER — Ambulatory Visit
Admission: RE | Admit: 2023-08-25 | Discharge: 2023-08-25 | Disposition: A | Discharge status: Home / Self Care | Payer: Medicare Other | Source: Ambulatory Visit | Attending: Family Medicine | Admitting: Family Medicine

## 2023-08-25 DIAGNOSIS — Z1231 Encounter for screening mammogram for malignant neoplasm of breast: Secondary | ICD-10-CM | POA: Diagnosis not present

## 2023-08-26 ENCOUNTER — Ambulatory Visit (INDEPENDENT_AMBULATORY_CARE_PROVIDER_SITE_OTHER): Payer: Medicare Other | Admitting: Family Medicine

## 2023-08-26 VITALS — BP 132/82 | HR 75 | Temp 97.8°F | Resp 18 | Ht 63.0 in | Wt 151.2 lb

## 2023-08-26 DIAGNOSIS — R634 Abnormal weight loss: Secondary | ICD-10-CM

## 2023-08-26 DIAGNOSIS — Z08 Encounter for follow-up examination after completed treatment for malignant neoplasm: Secondary | ICD-10-CM

## 2023-08-26 NOTE — Patient Instructions (Signed)
Good to see you today- I will be in touch with your labs and we will set up a CT of your chest/ abdomen and pelvis due to weight loss asap

## 2023-08-27 ENCOUNTER — Ambulatory Visit: Payer: Medicare Other | Admitting: Family Medicine

## 2023-08-27 ENCOUNTER — Ambulatory Visit: Payer: Medicare Other | Attending: Family Medicine | Admitting: Audiology

## 2023-08-27 DIAGNOSIS — H903 Sensorineural hearing loss, bilateral: Secondary | ICD-10-CM | POA: Diagnosis not present

## 2023-08-27 LAB — COMPREHENSIVE METABOLIC PANEL
ALT: 12 U/L (ref 0–35)
AST: 18 U/L (ref 0–37)
Albumin: 3.8 g/dL (ref 3.5–5.2)
Alkaline Phosphatase: 66 U/L (ref 39–117)
BUN: 15 mg/dL (ref 6–23)
CO2: 34 meq/L — ABNORMAL HIGH (ref 19–32)
Calcium: 8.8 mg/dL (ref 8.4–10.5)
Chloride: 104 meq/L (ref 96–112)
Creatinine, Ser: 0.89 mg/dL (ref 0.40–1.20)
GFR: 59.31 mL/min — ABNORMAL LOW (ref >60.00)
Glucose, Bld: 104 mg/dL — ABNORMAL HIGH (ref 70–99)
Potassium: 4.8 meq/L (ref 3.5–5.1)
Sodium: 144 meq/L (ref 135–145)
Total Bilirubin: 0.4 mg/dL (ref 0.2–1.2)
Total Protein: 6.6 g/dL (ref 6.0–8.3)

## 2023-08-27 LAB — CBC
HCT: 37.5 % (ref 36.0–46.0)
Hemoglobin: 12.2 g/dL (ref 12.0–15.0)
MCHC: 32.6 g/dL (ref 30.0–36.0)
MCV: 96.2 fL (ref 78.0–100.0)
Platelets: 274 10*3/uL (ref 150.0–400.0)
RBC: 3.89 Mil/uL (ref 3.87–5.11)
RDW: 15.4 % (ref 11.5–15.5)
WBC: 6.9 10*3/uL (ref 4.0–10.5)

## 2023-08-27 LAB — TSH: TSH: 2.37 u[IU]/mL (ref 0.35–5.50)

## 2023-08-27 NOTE — Procedures (Signed)
  Outpatient Audiology and [facility] [address] [place], [place]  [address] [phone]  AUDIOLOGICAL  EVALUATION  NAME: Mary Buck     DOB:   01-Jul-1936      MRN: [id number]                                                                                     DATE: 08/27/2023     REFERENT: Pearline Cables, MD STATUS: Outpatient DIAGNOSIS: sensorineural hearing loss   History: Mary Buck was seen for an audiological evaluation due to decreased hearing and processing ability. Mary Buck was last seen for an audiological evaluation on 08/22/2021 at which time testing showed normal hearing sensitivity sloping to a moderate to moderately-severe high frequency sensorineural hearing loss. Mary Buck reports her hearing sensitivity has decreased since her last evaluation. She reports her auditory processing speed has slowed down especially watching game shows on TV. Mary Buck reports increased difficulty hearing the television. Mary Buck reports bilateral tinnitus which is non-bothersome. She denies otalgia and aural fullness.   Evaluation:  Otoscopy showed a clear view of the tympanic membranes, bilaterally Tympanometry results were consistent with normal middle ear pressure and normal tympanic membrane mobility (Type A), bilaterally.  Audiometric testing was completed using Conventional Audiometry techniques with insert earphones and TDH headphones. Test results are consistent with normal hearing sensitivity sloping to a moderate to moderately-severe sensorineural hearing loss, bilaterally. Speech Recognition Thresholds were obtained at 25 dB HL in the right ear and at 25  dB HL in the left ear. Word Recognition Testing was completed at 70 dB HL and Rahi scored 100%, bilaterally.    Results:  The test results were reviewed with Mary Buck.  Test results are consistent with normal hearing sensitivity sloping to a moderate to moderately-severe sensorineural hearing loss, bilaterally. In  comparison to the last audiological evaluation, hearing threhoslds are stable. Mary Buck may have hearing difficulty in adverse listening environments. She will benefit from the use of good communication strategies. Mary Buck will benefit from the use of a sound bar for her television. Mary Buck's auditory processing skills were reviewed.   Recommendations: 1.   Use of a Sound bar for the television 2.   Monitor hearing sensitivity. Return in 2-3 years for an audiological evaluation.    30 minutes spent testing and counseling on results.   If you have any questions please feel free to contact me at [phone].  Marton Redwood Audiologist, Au.D., CCC-A 08/27/2023  3:29 PM  Cc: Pearline Cables, MD

## 2023-08-28 ENCOUNTER — Encounter: Payer: Self-pay | Admitting: Family Medicine

## 2023-08-31 DIAGNOSIS — J312 Chronic pharyngitis: Secondary | ICD-10-CM | POA: Diagnosis not present

## 2023-09-03 ENCOUNTER — Ambulatory Visit (HOSPITAL_BASED_OUTPATIENT_CLINIC_OR_DEPARTMENT_OTHER)
Admission: RE | Admit: 2023-09-03 | Discharge: 2023-09-03 | Disposition: A | Discharge status: Home / Self Care | Payer: Medicare Other | Source: Ambulatory Visit | Attending: Family Medicine | Admitting: Family Medicine

## 2023-09-03 DIAGNOSIS — K5730 Diverticulosis of large intestine without perforation or abscess without bleeding: Secondary | ICD-10-CM | POA: Diagnosis not present

## 2023-09-03 DIAGNOSIS — C679 Malignant neoplasm of bladder, unspecified: Secondary | ICD-10-CM | POA: Diagnosis not present

## 2023-09-03 DIAGNOSIS — K769 Liver disease, unspecified: Secondary | ICD-10-CM | POA: Diagnosis not present

## 2023-09-03 DIAGNOSIS — Z08 Encounter for follow-up examination after completed treatment for malignant neoplasm: Secondary | ICD-10-CM | POA: Diagnosis not present

## 2023-09-03 DIAGNOSIS — R634 Abnormal weight loss: Secondary | ICD-10-CM

## 2023-09-04 ENCOUNTER — Ambulatory Visit (INDEPENDENT_AMBULATORY_CARE_PROVIDER_SITE_OTHER): Payer: Medicare Other | Admitting: Gastroenterology

## 2023-09-04 ENCOUNTER — Encounter: Payer: Self-pay | Admitting: Gastroenterology

## 2023-09-04 VITALS — BP 130/70 | Ht 63.0 in | Wt 151.0 lb

## 2023-09-04 DIAGNOSIS — K219 Gastro-esophageal reflux disease without esophagitis: Secondary | ICD-10-CM

## 2023-09-04 DIAGNOSIS — R634 Abnormal weight loss: Secondary | ICD-10-CM

## 2023-09-04 DIAGNOSIS — R143 Flatulence: Secondary | ICD-10-CM

## 2023-09-04 DIAGNOSIS — K589 Irritable bowel syndrome without diarrhea: Secondary | ICD-10-CM | POA: Diagnosis not present

## 2023-09-04 DIAGNOSIS — R140 Abdominal distension (gaseous): Secondary | ICD-10-CM | POA: Diagnosis not present

## 2023-09-04 MED ORDER — FAMOTIDINE 20 MG PO TABS: 20.0000 mg | ORAL_TABLET | Freq: Every day | ORAL | 1 refills | Status: DC

## 2023-09-04 MED ORDER — MISOPROSTOL 100 MCG PO TABS: ORAL_TABLET | ORAL | 5 refills | Status: DC

## 2023-09-04 MED ORDER — LANSOPRAZOLE 30 MG PO CPDR: 30.0000 mg | DELAYED_RELEASE_CAPSULE | Freq: Two times a day (BID) | ORAL | 1 refills | Status: DC

## 2023-09-04 MED ORDER — LANSOPRAZOLE 30 MG PO CPDR: 30.0000 mg | DELAYED_RELEASE_CAPSULE | Freq: Every day | ORAL | 1 refills | Status: DC

## 2023-09-04 NOTE — Progress Notes (Signed)
Chief Complaint: refill medications  GI history:  87 y.o. female with a history of chest pain, spinal stenosis with neurogenic claudication (follows at pain management), bladder CA, hypothyroidism, HLD, peripheral neuropathy, initially seen in the GI clinic in 04/2020 for evaluation of loose, nonbloody stools (but not watery diarrhea) w/ urgency.  Symptoms started following spine surgery approximately 5 weeks earlier, and was improving with dietary modifications alone at the time of initial appointment.   History of IBS-D.  Previously followed with a Solicitor in [place] for a longstanding history of loose, irregular stools, but described as non-bothersome to patient.  Essentially well-controlled with dietary modification alone.  Last colonoscopy 2019 in [place].   Longstanding history of reflux, well controlled with Prevacid BID and misoprostol (started in 2019 for gastritis on EGD), which she has been taking for years prescribed by her previous GI.  Worse with coffee and overeating.  Regurgitation with forward flexion. EGD 2019 in [place].   Endoscopic History -EGD (02/22/2018, Dr. Serita Sheller, MD): Abnormal esophageal motility compatible with presbyesophagus,  normal esophageal mucosa, acute gastritis at GE junction, small sliding 2 cm hiatal hernia, bile induced gastritis with fluid in antrum. (path: non-H pylori gastritis) -Colonoscopy (02/22/2018, Dr. Serita Sheller, MD): Normal   HPI: 11/28/2021 Patient in office to see Dr. Barron Alvine and her GERD and IBS was doing well on current treatments. Occasionally loose stools, benefiber was added. She had left flank pain at time suspected to be costochondritis with recommendations for conservative treatment.  08/26/2023 Patient seem by Dr. Shanda Bumps Copland with concerns for weight loss. They ordered a CT chest, abdomen,and pelvis without contrast due to contrast allergy. Results pending. Labs collected: WBC 6.9, platelets 274, Hgb  12.2, BUN 15, Creat 0.89, normal LFTs, TSH 2.37.  Interval History:    Patient reports she is doing well overall. Longstanding history of reflux, well controlled with Prevacid BID, Pepcid at bedtime,  and misoprostol BID.  History of IBS-D.  Essentially well-controlled with dietary modification alone. Does have some gas at times. Denies abdominal pain or rectal bleeding.      We reviewed her recent concern with weight loss and patient states per her scale she has lost over 20 lbs in the past year. She states she has been eating less, one to two meals per day on average. Overall, she states she feels fine. Does have some fatigue. She would like to get back to exercising and was swimming at the [facility] before Covid.  Wt Readings from Last 3 Encounters:  09/04/23 151 lb (68.5 kg)  08/26/23 151 lb 3.2 oz (68.6 kg)  07/08/23 154 lb (69.9 kg)    Past Medical History:  Diagnosis Date   Allergy    Asthma    as teen   Cataract    DDD (degenerative disc disease), lumbar 06/01/2017   Depression    GERD (gastroesophageal reflux disease)    Heart murmur    no issues   Hyperlipidemia    Hypothyroidism    IDA (iron deficiency anemia) 05/31/2020   Peripheral neuropathy 04/30/2017   Skin cancer    left leg had radiation, bladder cancer   Sleep apnea    no CPAP   Thyroid disease    Past Surgical History:  Procedure Laterality Date   bladder cancer  2013   CARPAL TUNNEL RELEASE  2015   CATARACT EXTRACTION Bilateral 2012   CHOLECYSTECTOMY     COLONOSCOPY WITH ESOPHAGOGASTRODUODENOSCOPY (EGD)  02/22/2018   [facility]  SALIVARY GLAND SURGERY Left 2015   benign   SPINE SURGERY  6/31/21   vertaflex   TONSILLECTOMY AND ADENOIDECTOMY  1943   TOTAL HIP ARTHROPLASTY Left 01/20/2023   Procedure: TOTAL HIP ARTHROPLASTY;  Surgeon: Teryl Lucy, MD;  Location: [facility];  Service: Orthopedics;  Laterality: Left;   Current Outpatient Medications  Medication Sig Dispense Refill    Carboxymethylcellulose Sod PF (LUBRICANT EYE DROPS PF) 0.5 % SOLN Place 1 drop into both eyes 4 (four) times daily as needed (dry eyes).     Carboxymethylcellulose Sodium (REFRESH LIQUIGEL) 1 % GEL Place 1 drop into both eyes at bedtime.     gabapentin (NEURONTIN) 300 MG capsule Take 1 capsule (300 mg total) by mouth 3 (three) times daily. (Patient taking differently: Take 300 mg by mouth 2 (two) times daily before lunch and supper.) 90 capsule 4   levothyroxine (SYNTHROID) 88 MCG tablet Take 1 tablet (88 mcg total) by mouth daily before breakfast. 90 tablet 1   Probiotic Product (RISAQUAD PO) Take 1 capsule by mouth daily.     simvastatin (ZOCOR) 20 MG tablet TAKE 1 TABLET(20 MG) BY MOUTH DAILY 90 tablet 3   Vitamin D-Vitamin K (VITAMIN K2-VITAMIN D3 PO) Take 1 tablet by mouth daily.     famotidine (PEPCID) 20 MG tablet Take 1 tablet (20 mg total) by mouth daily. 90 tablet 1   lansoprazole (PREVACID) 30 MG capsule Take 1 capsule (30 mg total) by mouth 2 (two) times daily. 180 capsule 1   misoprostol (CYTOTEC) 100 MCG tablet TAKE 2 TABLETS BY MOUTH AFTER BREAKFAST AND AFTER DINNER AS DIRECTED. Keep your scheduled appointment. 120 tablet 5   No current facility-administered medications for this visit.   Allergies as of 09/04/2023 - Review Complete 09/04/2023  Allergen Reaction Noted   Contrast media [iodinated contrast media] Anaphylaxis 04/30/2017   Oxycodone Anaphylaxis 03/05/2020   Latex Other (See Comments) 07/05/2015   Mirtazapine Nausea Only 07/05/2015   Acitretin Rash 05/02/2019   Cephalexin Other (See Comments) 07/05/2015   Elemental sulfur Other (See Comments) 12/10/2015   Penicillins Other (See Comments) 04/30/2017   Sulfa antibiotics Other (See Comments) 04/30/2017   Family History  Problem Relation Age of Onset   Macular degeneration Mother    Hyperlipidemia Father    Stroke Father    Cancer Brother        ? stomach   Prostate cancer Other        in brothers   Colon  cancer Neg Hx    Review of Systems:    Constitutional: No weight loss, fever, chills, weakness or fatigue HEENT: Eyes: No change in vision               Ears, Nose, Throat:  No change in hearing or congestion Skin: No rash or itching Cardiovascular: No chest pain, chest pressure or palpitations   Respiratory: No SOB or cough Gastrointestinal: See HPI and otherwise negative Genitourinary: No dysuria or change in urinary frequency Neurological: No headache, dizziness or syncope Musculoskeletal: No new muscle or joint pain Hematologic: No bleeding or bruising Psychiatric: No history of depression or anxiety   Physical Exam:  Vital signs: BP 130/70   Ht 5\' 3"  (1.6 m)   Wt 151 lb (68.5 kg)   BMI 26.75 kg/m  Constitutional: Pleasant Caucasian female appears to be in NAD, Well developed, Well nourished, alert and cooperative Neck:  Supple Throat: Oral cavity and pharynx without inflammation, swelling or lesion.  Respiratory: Respirations even and unlabored.  Lungs clear to auscultation bilaterally.   No wheezes, crackles, or rhonchi.  Cardiovascular: Normal S1, S2.Regular rate and rhythm. No peripheral edema, cyanosis or pallor.  Gastrointestinal:  Soft, nondistended, nontender. No rebound or guarding. Normal bowel sounds. No appreciable masses or hepatomegaly. Rectal:  Not performed.  Msk:  Symmetrical without gross deformities. Without edema, no deformity or joint abnormality. Uses cane. Neurologic:  Alert and  oriented x4;  grossly normal neurologically.  Skin:   Dry and intact without significant lesions or rashes. Psychiatric: Oriented to person, place and time. Demonstrates good judgement and reason without abnormal affect or behaviors.  RELEVANT LABS AND IMAGING: CBC    Component Value Date/Time   WBC 6.9 08/26/2023 1518   RBC 3.89 08/26/2023 1518   HGB 12.2 08/26/2023 1518   HGB 12.4 10/29/2022 1316   HCT 37.5 08/26/2023 1518   PLT 274.0 08/26/2023 1518   PLT 197  10/29/2022 1316   MCV 96.2 08/26/2023 1518   MCH 31.5 02/20/2023 1149   MCHC 32.6 08/26/2023 1518   RDW 15.4 08/26/2023 1518   LYMPHSABS 1.6 02/20/2023 1149   MONOABS 0.6 02/20/2023 1149   EOSABS 0.3 02/20/2023 1149   BASOSABS 0.1 02/20/2023 1149   CMP     Component Value Date/Time   NA 144 08/26/2023 1518   K 4.8 08/26/2023 1518   CL 104 08/26/2023 1518   CO2 34 (H) 08/26/2023 1518   GLUCOSE 104 (H) 08/26/2023 1518   BUN 15 08/26/2023 1518   CREATININE 0.89 08/26/2023 1518   CREATININE 0.92 05/30/2020 1453   CALCIUM 8.8 08/26/2023 1518   PROT 6.6 08/26/2023 1518   ALBUMIN 3.8 08/26/2023 1518   AST 18 08/26/2023 1518   AST 15 05/30/2020 1453   ALT 12 08/26/2023 1518   ALT 10 05/30/2020 1453   ALKPHOS 66 08/26/2023 1518   BILITOT 0.4 08/26/2023 1518   BILITOT 0.4 05/30/2020 1453   GFRNONAA >60 02/20/2023 1149   GFRNONAA 58 (L) 05/30/2020 1453   GFRAA >60 05/30/2020 1453   Assessment: Encounter Diagnoses  Name Primary?   Gastroesophageal reflux disease without esophagitis Yes   Irritable bowel syndrome, unspecified type    Bloating    Flatulence    Loss of weight     87 year old female patient with long standing history of GERD and IBS, controlled with medications and dietary modifications. We did discuss the low FODmap diet and how it Sulma Ruffino help with her gas. We also discussed adding protein shakes into her daily regimen and engaging in physical exercise as tolerated.    Plan: -Recommend adding protein shake once to twice daily. -Low FODmap pamphlet handout -Continue lansoprazole, refill -Continue famotidine , refill -Continue misoprostol, refill -Follow-up in 1 year   Julianah Marciel, FNP-C [facility] 09/04/2023, 1:29 PM  Cc: Copland, Gwenlyn Found, MD

## 2023-09-04 NOTE — Patient Instructions (Signed)
We have sent the following medications to your pharmacy for you to pick up at your convenience: Pepcid, Misoprostol, Lansoprazole   Follow-up in 1 year. Sooner if needed.   _______________________________________________________  If your blood pressure at your visit was 140/90 or greater, please contact your primary care physician to follow up on this.  _______________________________________________________  If you are age 85 or older, your body mass index should be between 23-30. Your Body mass index is 26.75 kg/m. If this is out of the aforementioned range listed, please consider follow up with your Primary Care Provider.  If you are age 34 or younger, your body mass index should be between 19-25. Your Body mass index is 26.75 kg/m. If this is out of the aformentioned range listed, please consider follow up with your Primary Care Provider.   ________________________________________________________  The [facility] providers would like to encourage you to use [facility] to communicate with providers for non-urgent requests or questions.  Due to long hold times on the telephone, sending your provider a message by [facility] may be a faster and more efficient way to get a response.  Please allow 48 business hours for a response.  Please remember that this is for non-urgent requests.  _______________________________________________________  Thank you for choosing me and [facility].  Deanna May, NP

## 2023-09-07 NOTE — Progress Notes (Signed)
Agree with the assessment and plan as outlined by [facility], NP.  Xitlali Kastens, DO, [facility]

## 2023-09-10 ENCOUNTER — Encounter: Payer: Self-pay | Admitting: Psychology

## 2023-09-10 NOTE — Progress Notes (Signed)
Neuropsychology Visit  Patient:  Mary Buck   DOB: 1935/11/19  MR Number: [id number]  Location: [facility] A [facility]PT OF [facility] [address] [place], [place] [address] [place] [address] Dept: [phone]  Date of Service: 08/26/2023  Start: 1 PM End: 2 PM  Today's visit was an in person visit was conducted in my outpatient clinic office.  The patient myself were present.  Duration of Service: 1 Hour  Provider/Observer:     Hershal Coria PsyD  Chief Complaint:      Chief Complaint  Patient presents with   Depression   Anxiety   Stress   Pain   Back Pain    Reason For Service:     Mary Buck is an 87 year old female who has a history of anxiety and depressive symptomatology going back to adolescence.  Patient has had issues with insomnia since age 63 that were started around the time when she had an encounter with a snake and stayed up all night worrying that she had been bitten with a venomous bite despite absence of physical signs or symptoms.  OCD and anxiety have persisted throughout life.  Patient had a recent neuropsychological evaluation due to concerns of cognitive changes but did quite well on almost all neuropsychological measures and memory issues are likely related to anxiety and normal age-related changes.  The patient has been followed by Dr. Vella Kohler for psychotherapeutic interventions and after he left the practice she is following up with myself.  Treatment Interventions:  Cognitive/behavioral psychotherapeutic interventions or any issues related to generalized anxiety and coping and stress.  Participation Level:   Active  Participation Quality:  Appropriate      Behavioral Observation:  Well Groomed, Alert, and Appropriate.   Current Psychosocial Factors: Patient continues with some significant stress primarily caused by chronic pain symptoms.  Content  of Session:   Reviewed current symptoms and worked on therapeutic interventions around issues of anxiety and depressive symptomatology and coping with various stressors in her life.  Effectiveness of Interventions: Report has begun to be established and the patient has been open and engaging throughout the therapeutic efforts and we are working on transitioning from previous psychologist.  Target Goals:   We continue to work issues related to coping skills around her anxiety and adjustment to significant changes in physical functioning.  Patient continues with significant back pain and has an appointment coming up with her orthopedist.  Patient has been diagnosed with obstructive sleep apnea but has not been able to tolerate a CPAP machine when she was initially in the fitting.  And will also be important to try to get the patient to give the CPAP device another effort as it may have a beneficial effect for her anxiety, memory and depressive symptomatology.  Goals Last Reviewed:   08/26/2023  Goals Addressed Today:    Today we continue to work on coping skills and strategies around anxiety and depressive types of symptoms and dealing with significant chronic pain issues and coping with aging.  Impression/Diagnosis:   Mary Buck is an 87 year old female who has a history of anxiety and depressive symptomatology going back to adolescence.  Patient has had issues with insomnia since age 29 that were started around the time when she had an encounter with a snake and stayed up all night worrying that she had been bitten with a venomous bite despite absence of  physical signs or symptoms.  OCD and anxiety have persisted throughout life.  Patient had a recent neuropsychological evaluation due to concerns of cognitive changes but did quite well on almost all neuropsychological measures and memory issues are likely related to anxiety and normal age-related changes.  The patient has been followed by Dr. Vella Kohler  for psychotherapeutic interventions and after he left the practice she is following up with myself.  Patient had a fall yesterday and has had more recent fall as well.  Diagnosis:   Moderate episode of recurrent major depressive disorder (HCC)  OSA (obstructive sleep apnea)  Chronic pain syndrome  Generalized anxiety disorder    Arley Phenix, Psy.D. Clinical Psychologist Neuropsychologist

## 2023-09-14 ENCOUNTER — Encounter: Payer: Self-pay | Admitting: Family Medicine

## 2023-09-16 NOTE — Patient Instructions (Addendum)
 It was great to see you today, happy new year!  I will set you up to see Dr Onalee Hua at [facility] for your hand pain

## 2023-09-16 NOTE — Progress Notes (Signed)
 [facility] [address] [place], [place] [address] [phone]  Date:  09/17/2023   Name:  Mary Buck   DOB:  Oct 21, 1935   MRN:  [id number]  PCP:  Watt Harlene JAYSON, MD    Chief Complaint: Pain (Concerns/ questions: pt asks if you had gotten the CT results yet)   History of Present Illness:  Mary Buck is a 88 y.o. very pleasant female patient who presents with the following:  Patient seen today with some concerns about musculoskeletal issues. I saw her most recently about 3 weeks ago She has history of osteopenia, degenerative disc disease in her back and spinal stenosis, bladder cancer, fairly extensive lower extremity skin cancer, hypothyroidism, hyperlipidemia, asthma and allergies   She reached out to me more recently via MyChart with some concerns, cause was not clear to me so I asked her to come in: Dead veins are blue-black and run like railroad tracks crisscrossed across my foot,  perfect staging for an old Boris Karloff film.Believe it or not I am trying not to sound dramatic here, but the sight did unsettle me.  (Whose foot is that foot?)  Somewhere I read that heat was good for such problems, so I tried it.  Voila!  There was some clearing after a night wearing socks. There is a spot inside my forearm near the elbow that has the same painful reaction as the trigger finger.  If it gets pushed/touched the nerve runs to the flexor tendon on my hand and creates the same pain.  Do you know of any orthopedist who believes in least pain approach?  She was concerned about veins on her right foot-  She was noticing pain in her right foot up the right leg- she looked and felt like the superficial veins looked really dark and prominent which worried her  She tried wearing socks to keep her feet warmer and this seemed to help The pain is now running all the way from her lower back down her leg   She notes  significant right hip arthritis, per report orthopedics is considering doing a hip replacement.  She wonders if this is safe.  I advised her she is getting on in years for a joint replacement.  However if her joint pain is seriously limiting her quality of life it may be worth the risk  She also has a mild Dupuytren's contracture in the right hand and some pain radiating up into the right forearm which is very bothersome to her  Patient Active Problem List   Diagnosis Date Noted   S/P hip replacement, left 01/20/2023   Thyroid  disease    Heart murmur    GERD (gastroesophageal reflux disease)    Depression    Cataract    Cancer (HCC)    Asthma    Allergy    IDA (iron deficiency anemia) 05/31/2020   Osteopenia 05/30/2019   Pedal edema 01/31/2018   Squamous cell carcinoma of skin of left lower limb, including hip 12/16/2017   Plantar fasciitis 07/07/2017   DDD (degenerative disc disease), lumbar 06/01/2017   Fibromyalgia 06/01/2017   Spinal stenosis at L4-L5 level 04/30/2017   Peripheral neuropathy 04/30/2017   Encounter for therapeutic drug monitoring 03/05/2017   Bladder cancer (HCC) 08/20/2015   Arthropathy of lumbar facet joint 07/27/2015   Spinal stenosis of cervical region 07/27/2015   Spondylolisthesis 07/27/2015   Hypothyroid 07/05/2015   Abdominal pain,  acute, left upper quadrant 06/28/2015   Basal cell carcinoma, face 06/28/2015   Bilateral groin pain 06/28/2015   Chronic pain 06/28/2015   Fatigue 06/28/2015   Hyperlipidemia 06/28/2015   Bilateral hip pain 06/28/2015   Paresthesia of lower lip 06/28/2015   Pulmonary nodule 06/28/2015   Pustulosis palmaris et plantaris 06/28/2015   Ulcer of nose 06/28/2015   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   Mass of parotid gland 03/24/2014    Past Medical History:  Diagnosis Date   Allergy    Asthma    as teen   Cataract    DDD (degenerative disc disease), lumbar 06/01/2017   Depression    GERD (gastroesophageal reflux disease)     Heart murmur    no issues   Hyperlipidemia    Hypothyroidism    IDA (iron deficiency anemia) 05/31/2020   Peripheral neuropathy 04/30/2017   Skin cancer    left leg had radiation, bladder cancer   Sleep apnea    no CPAP   Thyroid  disease     Past Surgical History:  Procedure Laterality Date   bladder cancer  2013   CARPAL TUNNEL RELEASE  2015   CATARACT EXTRACTION Bilateral 2012   CHOLECYSTECTOMY     COLONOSCOPY WITH ESOPHAGOGASTRODUODENOSCOPY (EGD)  02/22/2018   [facility]   SALIVARY GLAND SURGERY Left 2015   benign   SPINE SURGERY  6/31/21   vertaflex   TONSILLECTOMY AND ADENOIDECTOMY  1943   TOTAL HIP ARTHROPLASTY Left 01/20/2023   Procedure: TOTAL HIP ARTHROPLASTY;  Surgeon: Josefina Chew, MD;  Location: [facility];  Service: Orthopedics;  Laterality: Left;    Social History   Tobacco Use   Smoking status: Former    Current packs/day: 0.00    Types: Cigarettes    Quit date: 2015    Years since quitting: 10.0   Smokeless tobacco: Never  Vaping Use   Vaping status: Never Used  Substance Use Topics   Alcohol  use: No   Drug use: No    Family History  Problem Relation Age of Onset   Macular degeneration Mother    Hyperlipidemia Father    Stroke Father    Cancer Brother        ? stomach   Prostate cancer Other        in brothers   Colon cancer Neg Hx     Allergies  Allergen Reactions   Contrast Media [Iodinated Contrast Media] Anaphylaxis   Oxycodone Anaphylaxis    my throat swells with any opioid (tolerated hydrocodone  01/21/23)   Latex Other (See Comments)    Other reaction(s): blistering, redness    Mirtazapine Nausea Only   Acitretin Rash   Cephalexin Other (See Comments)    Unknown (tolerated Ancef  01/21/23)   Elemental Sulfur Other (See Comments)    unknown   Penicillins Other (See Comments)    Unknown   Sulfa Antibiotics Other (See Comments)    Unknown    Medication list has been reviewed and  updated.  Current Outpatient Medications on File Prior to Visit  Medication Sig Dispense Refill   Carboxymethylcellulose Sod PF (LUBRICANT EYE DROPS PF) 0.5 % SOLN Place 1 drop into both eyes 4 (four) times daily as needed (dry eyes).     Carboxymethylcellulose Sodium (REFRESH LIQUIGEL) 1 % GEL Place 1 drop into both eyes at bedtime.     famotidine  (PEPCID ) 20 MG tablet Take 1 tablet (20 mg total) by mouth daily. 90 tablet 1   gabapentin  (NEURONTIN ) 300  MG capsule Take 1 capsule (300 mg total) by mouth 3 (three) times daily. (Patient taking differently: Take 300 mg by mouth 2 (two) times daily before lunch and supper.) 90 capsule 4   lansoprazole  (PREVACID ) 30 MG capsule Take 1 capsule (30 mg total) by mouth 2 (two) times daily. 180 capsule 1   levothyroxine  (SYNTHROID ) 88 MCG tablet Take 1 tablet (88 mcg total) by mouth daily before breakfast. 90 tablet 1   misoprostol  (CYTOTEC ) 100 MCG tablet TAKE 2 TABLETS BY MOUTH AFTER BREAKFAST AND AFTER DINNER AS DIRECTED. Keep your scheduled appointment. 120 tablet 5   Probiotic Product (RISAQUAD PO) Take 1 capsule by mouth daily.     simvastatin  (ZOCOR ) 20 MG tablet TAKE 1 TABLET(20 MG) BY MOUTH DAILY 90 tablet 3   Vitamin D-Vitamin K (VITAMIN K2-VITAMIN D3 PO) Take 1 tablet by mouth daily.     No current facility-administered medications on file prior to visit.    Review of Systems:  As per HPI- otherwise negative.   Physical Examination: Vitals:   09/17/23 1457  BP: 122/70  Pulse: 69  Resp: 18  Temp: 98 F (36.7 C)  SpO2: 95%   Vitals:   09/17/23 1457  Weight: 152 lb (68.9 kg)  Height: 5' 3 (1.6 m)   Body mass index is 26.93 kg/m. Ideal Body Weight: Weight in (lb) to have BMI = 25: 140.8  GEN: no acute distress.  Appears her normal self HEENT: Atraumatic, Normocephalic.  Ears and Nose: No external deformity. CV: RRR, No M/G/R. No JVD. No thrill. No extra heart sounds. PULM: CTA B, no wheezes, crackles, rhonchi. No  retractions. No resp. distress. No accessory muscle use. ABD: S, NT, ND, +BS. No rebound. No HSM. EXTR: No c/c/e PSYCH: Normally interactive. Conversant.  She has many superficial spider veins on her right foot, I reassured her these are not harmful.  Strong dorsal pedal pulse Mild Dupuytren's contracture in the palm of the right hand.  Otherwise I am not able to reproduce abnormality of the right upper extremity  Assessment and Plan: Dupuytren contracture - Plan: Ambulatory referral to Hand Surgery  Spider veins  Patient seen today with a couple of concerns. I reassured her spider veins are not harmful.  I do not think is of the source of her pain.  She is considering having a hip replacement  Referral to see Dr. Zell Mussel regarding her hand  Signed Harlene Schroeder, MD

## 2023-09-17 ENCOUNTER — Ambulatory Visit (INDEPENDENT_AMBULATORY_CARE_PROVIDER_SITE_OTHER): Payer: Medicare Other | Admitting: Family Medicine

## 2023-09-17 VITALS — BP 122/70 | HR 69 | Temp 98.0°F | Resp 18 | Ht 63.0 in | Wt 152.0 lb

## 2023-09-17 DIAGNOSIS — I781 Nevus, non-neoplastic: Secondary | ICD-10-CM

## 2023-09-17 DIAGNOSIS — M720 Palmar fascial fibromatosis [Dupuytren]: Secondary | ICD-10-CM

## 2023-09-18 ENCOUNTER — Telehealth: Payer: Self-pay | Admitting: Gastroenterology

## 2023-09-18 ENCOUNTER — Encounter: Payer: Self-pay | Admitting: Family Medicine

## 2023-09-18 DIAGNOSIS — R918 Other nonspecific abnormal finding of lung field: Secondary | ICD-10-CM

## 2023-09-18 DIAGNOSIS — K769 Liver disease, unspecified: Secondary | ICD-10-CM

## 2023-09-18 MED ORDER — LANSOPRAZOLE 30 MG PO CPDR: 30.0000 mg | DELAYED_RELEASE_CAPSULE | Freq: Two times a day (BID) | ORAL | 1 refills | Status: AC

## 2023-09-18 NOTE — Telephone Encounter (Signed)
 PT is calling to about the lansoprazole prescription. It is written wrong and said that she should take them once a day when it should be twice a day. Please advise.

## 2023-09-18 NOTE — Telephone Encounter (Signed)
 Spoke with patient regarding refill on lansoprazole .  She stated that when she picked up Rx from pharmacy they had only filled the medication for once daily. I explained to the patient that we sent in lansoprazole  30mg  one capsule twice daily #180 to [facility] in [facility] on 09-04-23.  Patient is concerned because she will not have enough medications.    Patient advised that lansoprazole  will be sent in again and she should speak with pharmacy to see if they have further instruction as it seems the mistake was on their end.  Patient advised to contact our office with any further questions or concerns.  Patient agreed to plan and verbalized understanding.  No further questions.

## 2023-09-28 DIAGNOSIS — M48062 Spinal stenosis, lumbar region with neurogenic claudication: Secondary | ICD-10-CM | POA: Diagnosis not present

## 2023-09-28 DIAGNOSIS — G894 Chronic pain syndrome: Secondary | ICD-10-CM | POA: Diagnosis not present

## 2023-09-28 DIAGNOSIS — M47816 Spondylosis without myelopathy or radiculopathy, lumbar region: Secondary | ICD-10-CM | POA: Diagnosis not present

## 2023-10-02 DIAGNOSIS — M5431 Sciatica, right side: Secondary | ICD-10-CM | POA: Diagnosis not present

## 2023-10-07 ENCOUNTER — Encounter: Payer: Self-pay | Admitting: Family Medicine

## 2023-10-18 NOTE — Addendum Note (Signed)
Addended by: Pearline Cables on: 10/18/2023 06:49 AM   Modules accepted: Orders

## 2023-10-22 ENCOUNTER — Encounter: Payer: Self-pay | Admitting: Psychology

## 2023-10-22 ENCOUNTER — Encounter: Payer: Medicare Other | Attending: Psychology | Admitting: Psychology

## 2023-10-22 DIAGNOSIS — G894 Chronic pain syndrome: Secondary | ICD-10-CM | POA: Diagnosis not present

## 2023-10-22 DIAGNOSIS — F411 Generalized anxiety disorder: Secondary | ICD-10-CM | POA: Diagnosis not present

## 2023-10-22 DIAGNOSIS — G4733 Obstructive sleep apnea (adult) (pediatric): Secondary | ICD-10-CM

## 2023-10-22 DIAGNOSIS — F331 Major depressive disorder, recurrent, moderate: Secondary | ICD-10-CM | POA: Diagnosis not present

## 2023-10-22 DIAGNOSIS — G479 Sleep disorder, unspecified: Secondary | ICD-10-CM

## 2023-10-22 NOTE — Progress Notes (Signed)
 Neuropsychology Visit  Patient:  Mary Buck   DOB: 1936-08-08  MR Number: [id number]  Location: [facility] [place] [facility] [address] [place], [address] [place] [address] Dept: [phone]  Date of Service: 10/22/2023  Start: 2 PM End: 3 PM  Today's visit was an in person visit was conducted in my outpatient clinic office.  The patient myself were present.  Duration of Service: 1 Hour  Provider/Observer:     Mary JONELLE Asa PsyD  Chief Complaint:      Chief Complaint  Patient presents with   Depression   Anxiety   Pain   Stress   Back Pain   Gait Problem   Fall    Reason For Service:     Mary Buck is an 88 year old female who has a history of anxiety and depressive symptomatology going back to adolescence.  Patient has had issues with insomnia since age 78 that were started around the time when she had an encounter with a snake and stayed up all night worrying that she had been bitten with a venomous bite despite absence of physical signs or symptoms.  OCD and anxiety have persisted throughout life.  Patient had a recent neuropsychological evaluation due to concerns of cognitive changes but did quite well on almost all neuropsychological measures and memory issues are likely related to anxiety and normal age-related changes.  The patient has been followed by Dr. Zusman for psychotherapeutic interventions and after he left the practice she is following up with myself.  Treatment Interventions:  Cognitive/behavioral psychotherapeutic interventions or any issues related to generalized anxiety and coping and stress.  Participation Level:   Active  Participation Quality:  Appropriate      Behavioral Observation:  Well Groomed, Alert, and Appropriate.   Current Psychosocial Factors: Patient continues with some significant stress primarily caused by chronic pain symptoms.  Content of  Session:   Reviewed current symptoms and worked on therapeutic interventions around issues of anxiety and depressive symptomatology and coping with various stressors in her life.  Effectiveness of Interventions: Report has begun to be established and the patient has been open and engaging throughout the therapeutic efforts and we are working on transitioning from previous psychologist.  Target Goals:   We continue to work issues related to coping skills around her anxiety and adjustment to significant changes in physical functioning.  Patient continues with significant back pain and has an appointment coming up with her orthopedist.  Patient has been diagnosed with obstructive sleep apnea but has not been able to tolerate a CPAP machine when she was initially in the fitting.  And will also be important to try to get the patient to give the CPAP device another effort as it may have a beneficial effect for her anxiety, memory and depressive symptomatology.  Goals Last Reviewed:   10/22/2023  Goals Addressed Today:    Today we continue to work on coping skills and strategies around anxiety and depressive types of symptoms and dealing with significant chronic pain issues and coping with aging.  Impression/Diagnosis:   Mary Buck is an 88 year old female who has a history of anxiety and depressive symptomatology going back to adolescence.  Patient has had issues with insomnia since age 57 that were started around the time when she had an encounter with a snake and stayed up all night worrying that she had been bitten with a venomous bite despite absence of physical signs  or symptoms.  OCD and anxiety have persisted throughout life.  Patient had a recent neuropsychological evaluation due to concerns of cognitive changes but did quite well on almost all neuropsychological measures and memory issues are likely related to anxiety and normal age-related changes.  The patient has been followed by Dr. Zusman for  psychotherapeutic interventions and after he left the practice she is following up with myself.  Patient had a fall yesterday and has had more recent fall as well.  Diagnosis:   Moderate episode of recurrent major depressive disorder (HCC)  OSA (obstructive sleep apnea)  Chronic pain syndrome  Generalized anxiety disorder  Sleep disturbance    Mary Buck, Psy.D. Clinical Psychologist Neuropsychologist

## 2023-10-30 ENCOUNTER — Inpatient Hospital Stay: Payer: Medicare Other | Attending: Family

## 2023-10-30 ENCOUNTER — Inpatient Hospital Stay (HOSPITAL_BASED_OUTPATIENT_CLINIC_OR_DEPARTMENT_OTHER): Payer: Medicare Other | Admitting: Family

## 2023-10-30 DIAGNOSIS — D509 Iron deficiency anemia, unspecified: Secondary | ICD-10-CM | POA: Diagnosis not present

## 2023-10-30 DIAGNOSIS — D649 Anemia, unspecified: Secondary | ICD-10-CM

## 2023-10-30 LAB — CBC WITH DIFFERENTIAL (CANCER CENTER ONLY)
Abs Immature Granulocytes: 0.07 10*3/uL (ref 0.00–0.07)
Basophils Absolute: 0.1 10*3/uL (ref 0.0–0.1)
Basophils Relative: 1 %
Eosinophils Absolute: 0.1 10*3/uL (ref 0.0–0.5)
Eosinophils Relative: 2 %
HCT: 38.5 % (ref 36.0–46.0)
Hemoglobin: 12.4 g/dL (ref 12.0–15.0)
Immature Granulocytes: 1 %
Lymphocytes Relative: 22 %
Lymphs Abs: 1.8 10*3/uL (ref 0.7–4.0)
MCH: 31.9 pg (ref 26.0–34.0)
MCHC: 32.2 g/dL (ref 30.0–36.0)
MCV: 99 fL (ref 80.0–100.0)
Monocytes Absolute: 0.6 10*3/uL (ref 0.1–1.0)
Monocytes Relative: 7 %
Neutro Abs: 5.5 10*3/uL (ref 1.7–7.7)
Neutrophils Relative %: 67 %
Platelet Count: 226 10*3/uL (ref 150–400)
RBC: 3.89 MIL/uL (ref 3.87–5.11)
RDW: 13.6 % (ref 11.5–15.5)
WBC Count: 8.2 10*3/uL (ref 4.0–10.5)
nRBC: 0 % (ref 0.0–0.2)

## 2023-10-30 LAB — RETICULOCYTES
Immature Retic Fract: 6.8 % (ref 2.3–15.9)
RBC.: 3.84 MIL/uL — ABNORMAL LOW (ref 3.87–5.11)
Retic Count, Absolute: 39.6 10*3/uL (ref 19.0–186.0)
Retic Ct Pct: 1 % (ref 0.4–3.1)

## 2023-10-30 LAB — FERRITIN: Ferritin: 9 ng/mL — ABNORMAL LOW (ref 11–307)

## 2023-10-30 NOTE — Progress Notes (Signed)
Hematology and Oncology Follow Up Visit  Mary Buck [id number] [birth year] 88 y.o. 10/30/2023   Principle Diagnosis:  Iron deficiency anemia    Current Therapy:        IV iron as indicated   Interim History:  Mary Buck is here today for follow-up. She is doing well but has had several falls since we last saw her. Thankfully, she states that she has not been seriously injured. She is now ambulating with a cane for added support.  She has not noted any blood loss. No bruising or petechiae.  No syncope reported.  No fever, chills, n/v, cough, rash, dizziness, SOB, chest pain, palpitations, abdominal pain or changes in bowel or bladder habits.  No swelling in her extremities.  Tingling in the hands comes and goes. Unchanged from baseline.  Appetite is good. She admits that she is trying to better hydrate throughout the day. Weight is 149 lbs.   ECOG Performance Status: 1 - Symptomatic but completely ambulatory  Medications:  Allergies as of 10/30/2023       Reactions   Contrast Media [iodinated Contrast Media] Anaphylaxis   Oxycodone Anaphylaxis   "my throat swells with any opioid" (tolerated hydrocodone 01/21/23)   Latex Other (See Comments)   Other reaction(s): blistering, redness    Mirtazapine Nausea Only   Acitretin Rash   Cephalexin Other (See Comments)   Unknown (tolerated Ancef 01/21/23)   Elemental Sulfur Other (See Comments)   unknown   Penicillins Other (See Comments)   Unknown   Sulfa Antibiotics Other (See Comments)   Unknown        Medication List        Accurate as of October 30, 2023  2:32 PM. If you have any questions, ask your nurse or doctor.          famotidine 20 MG tablet Commonly known as: PEPCID Take 1 tablet (20 mg total) by mouth daily.   gabapentin 300 MG capsule Commonly known as: NEURONTIN Take 1 capsule (300 mg total) by mouth 3 (three) times daily. What changed: when to take this   lansoprazole 30 MG capsule Commonly  known as: PREVACID Take 1 capsule (30 mg total) by mouth 2 (two) times daily.   levothyroxine 88 MCG tablet Commonly known as: SYNTHROID Take 1 tablet (88 mcg total) by mouth daily before breakfast.   misoprostol 100 MCG tablet Commonly known as: CYTOTEC TAKE 2 TABLETS BY MOUTH AFTER BREAKFAST AND AFTER DINNER AS DIRECTED. Keep your scheduled appointment.   Refresh Liquigel 1 % Gel Generic drug: Carboxymethylcellulose Sodium Place 1 drop into both eyes at bedtime.   Lubricant Eye Drops PF 0.5 % Soln Generic drug: Carboxymethylcellulose Sod PF Place 1 drop into both eyes 4 (four) times daily as needed (dry eyes).   RISAQUAD PO Take 1 capsule by mouth daily.   simvastatin 20 MG tablet Commonly known as: ZOCOR TAKE 1 TABLET(20 MG) BY MOUTH DAILY   VITAMIN K2-VITAMIN D3 PO Take 1 tablet by mouth daily.        Allergies:  Allergies  Allergen Reactions   Contrast Media [Iodinated Contrast Media] Anaphylaxis   Oxycodone Anaphylaxis    "my throat swells with any opioid" (tolerated hydrocodone 01/21/23)   Latex Other (See Comments)    Other reaction(s): blistering, redness    Mirtazapine Nausea Only   Acitretin Rash   Cephalexin Other (See Comments)    Unknown (tolerated Ancef 01/21/23)   Elemental Sulfur Other (See Comments)    unknown  Penicillins Other (See Comments)    Unknown   Sulfa Antibiotics Other (See Comments)    Unknown    Past Medical History, Surgical history, Social history, and Family History were reviewed and updated.  Review of Systems: All other 10 point review of systems is negative.   Physical Exam:  vitals were not taken for this visit.   Wt Readings from Last 3 Encounters:  09/17/23 152 lb (68.9 kg)  09/04/23 151 lb (68.5 kg)  08/26/23 151 lb 3.2 oz (68.6 kg)    Ocular: Sclerae unicteric, pupils equal, round and reactive to light Ear-nose-throat: Oropharynx clear, dentition fair Lymphatic: No cervical or supraclavicular adenopathy Lungs  no rales or rhonchi, good excursion bilaterally Heart regular rate and rhythm, no murmur appreciated Abd soft, nontender, positive bowel sounds MSK no focal spinal tenderness, no joint edema Neuro: non-focal, well-oriented, appropriate affect Breasts: Deferred   Lab Results  Component Value Date   WBC 8.2 10/30/2023   HGB 12.4 10/30/2023   HCT 38.5 10/30/2023   MCV 99.0 10/30/2023   PLT 226 10/30/2023   Lab Results  Component Value Date   FERRITIN 25 10/29/2022   IRON 71 10/29/2022   TIBC 298 10/29/2022   UIBC 227 10/29/2022   IRONPCTSAT 24 10/29/2022   Lab Results  Component Value Date   RETICCTPCT 1.0 10/30/2023   RBC 3.84 (L) 10/30/2023   No results found for: "KPAFRELGTCHN", "LAMBDASER", "KAPLAMBRATIO" No results found for: "IGGSERUM", "IGA", "IGMSERUM" No results found for: "TOTALPROTELP", "ALBUMINELP", "A1GS", "A2GS", "BETS", "BETA2SER", "GAMS", "MSPIKE", "SPEI"   Chemistry      Component Value Date/Time   NA 144 08/26/2023 1518   K 4.8 08/26/2023 1518   CL 104 08/26/2023 1518   CO2 34 (H) 08/26/2023 1518   BUN 15 08/26/2023 1518   CREATININE 0.89 08/26/2023 1518   CREATININE 0.92 05/30/2020 1453      Component Value Date/Time   CALCIUM 8.8 08/26/2023 1518   ALKPHOS 66 08/26/2023 1518   AST 18 08/26/2023 1518   AST 15 05/30/2020 1453   ALT 12 08/26/2023 1518   ALT 10 05/30/2020 1453   BILITOT 0.4 08/26/2023 1518   BILITOT 0.4 05/30/2020 1453       Impression and Plan: Mary Buck is a very pleasant 88 yo female with history of iron deficiency anemia.  Iron studies are pending. We will replace if needed.  Follow-up in 1 year.   Eileen Stanford, NP 2/14/20252:32 PM

## 2023-11-02 LAB — IRON AND IRON BINDING CAPACITY (CC-WL,HP ONLY)
Iron: 92 ug/dL (ref 28–170)
Saturation Ratios: 24 % (ref 10.4–31.8)
TIBC: 377 ug/dL (ref 250–450)
UIBC: 285 ug/dL (ref 148–442)

## 2023-11-04 DIAGNOSIS — G5603 Carpal tunnel syndrome, bilateral upper limbs: Secondary | ICD-10-CM | POA: Diagnosis not present

## 2023-11-09 DIAGNOSIS — I8312 Varicose veins of left lower extremity with inflammation: Secondary | ICD-10-CM | POA: Diagnosis not present

## 2023-11-09 DIAGNOSIS — I872 Venous insufficiency (chronic) (peripheral): Secondary | ICD-10-CM | POA: Diagnosis not present

## 2023-11-09 DIAGNOSIS — L905 Scar conditions and fibrosis of skin: Secondary | ICD-10-CM | POA: Diagnosis not present

## 2023-11-09 DIAGNOSIS — C44722 Squamous cell carcinoma of skin of right lower limb, including hip: Secondary | ICD-10-CM | POA: Diagnosis not present

## 2023-11-09 DIAGNOSIS — D485 Neoplasm of uncertain behavior of skin: Secondary | ICD-10-CM | POA: Diagnosis not present

## 2023-11-09 DIAGNOSIS — Z85828 Personal history of other malignant neoplasm of skin: Secondary | ICD-10-CM | POA: Diagnosis not present

## 2023-11-09 DIAGNOSIS — L570 Actinic keratosis: Secondary | ICD-10-CM | POA: Diagnosis not present

## 2023-11-09 DIAGNOSIS — I8311 Varicose veins of right lower extremity with inflammation: Secondary | ICD-10-CM | POA: Diagnosis not present

## 2023-11-10 ENCOUNTER — Inpatient Hospital Stay: Payer: Medicare Other

## 2023-11-10 VITALS — BP 158/70 | HR 63 | Temp 97.8°F | Resp 18

## 2023-11-10 DIAGNOSIS — D508 Other iron deficiency anemias: Secondary | ICD-10-CM

## 2023-11-10 DIAGNOSIS — D509 Iron deficiency anemia, unspecified: Secondary | ICD-10-CM | POA: Diagnosis not present

## 2023-11-10 MED ORDER — SODIUM CHLORIDE 0.9 % IV SOLN
510.0000 mg | Freq: Once | INTRAVENOUS | Status: AC
  Administered 2023-11-10: 510 mg via INTRAVENOUS
  Filled 2023-11-10: qty 17

## 2023-11-10 MED ORDER — SODIUM CHLORIDE 0.9 % IV SOLN: Freq: Once | INTRAVENOUS | Status: AC

## 2023-11-10 NOTE — Patient Instructions (Signed)

## 2023-11-16 DIAGNOSIS — M5431 Sciatica, right side: Secondary | ICD-10-CM | POA: Diagnosis not present

## 2023-11-17 ENCOUNTER — Inpatient Hospital Stay: Payer: Medicare Other | Attending: Family

## 2023-11-17 VITALS — BP 152/65 | HR 73 | Temp 98.1°F | Resp 18

## 2023-11-17 DIAGNOSIS — D508 Other iron deficiency anemias: Secondary | ICD-10-CM

## 2023-11-17 DIAGNOSIS — D509 Iron deficiency anemia, unspecified: Secondary | ICD-10-CM | POA: Insufficient documentation

## 2023-11-17 MED ORDER — SODIUM CHLORIDE 0.9 % IV SOLN
510.0000 mg | Freq: Once | INTRAVENOUS | Status: AC
  Administered 2023-11-17: 510 mg via INTRAVENOUS
  Filled 2023-11-17: qty 17

## 2023-11-17 MED ORDER — SODIUM CHLORIDE 0.9 % IV SOLN: Freq: Once | INTRAVENOUS | Status: AC

## 2023-11-17 NOTE — Patient Instructions (Signed)

## 2023-11-17 NOTE — Progress Notes (Signed)
 Pt refused to stay for 30 minutes post iron infusion.  Pt without complaints at time of discharge. ?

## 2023-11-19 ENCOUNTER — Encounter: Payer: Medicare Other | Attending: Psychology | Admitting: Psychology

## 2023-11-19 DIAGNOSIS — G479 Sleep disorder, unspecified: Secondary | ICD-10-CM | POA: Diagnosis not present

## 2023-11-19 DIAGNOSIS — G4733 Obstructive sleep apnea (adult) (pediatric): Secondary | ICD-10-CM | POA: Diagnosis not present

## 2023-11-19 DIAGNOSIS — F331 Major depressive disorder, recurrent, moderate: Secondary | ICD-10-CM | POA: Diagnosis not present

## 2023-11-19 DIAGNOSIS — F411 Generalized anxiety disorder: Secondary | ICD-10-CM | POA: Insufficient documentation

## 2023-11-19 DIAGNOSIS — G894 Chronic pain syndrome: Secondary | ICD-10-CM | POA: Diagnosis not present

## 2023-11-26 ENCOUNTER — Other Ambulatory Visit: Payer: Self-pay | Admitting: Family Medicine

## 2023-12-05 ENCOUNTER — Other Ambulatory Visit: Payer: Self-pay | Admitting: Family Medicine

## 2023-12-05 DIAGNOSIS — R600 Localized edema: Secondary | ICD-10-CM

## 2023-12-06 ENCOUNTER — Encounter: Payer: Self-pay | Admitting: Family Medicine

## 2023-12-06 DIAGNOSIS — R0981 Nasal congestion: Secondary | ICD-10-CM

## 2023-12-06 MED ORDER — MUPIROCIN 2 % EX OINT: 1.0000 | TOPICAL_OINTMENT | Freq: Two times a day (BID) | CUTANEOUS | 0 refills | Status: AC

## 2023-12-17 ENCOUNTER — Ambulatory Visit
Admission: RE | Admit: 2023-12-17 | Discharge: 2023-12-17 | Disposition: A | Discharge status: Home / Self Care | Payer: Medicare Other | Source: Ambulatory Visit | Attending: Family Medicine | Admitting: Family Medicine

## 2023-12-17 DIAGNOSIS — R918 Other nonspecific abnormal finding of lung field: Secondary | ICD-10-CM | POA: Diagnosis not present

## 2023-12-18 ENCOUNTER — Encounter: Payer: Self-pay | Admitting: Family Medicine

## 2023-12-18 DIAGNOSIS — Z23 Encounter for immunization: Secondary | ICD-10-CM | POA: Diagnosis not present

## 2023-12-21 ENCOUNTER — Encounter: Payer: Medicare Other | Attending: Psychology | Admitting: Psychology

## 2023-12-21 DIAGNOSIS — F331 Major depressive disorder, recurrent, moderate: Secondary | ICD-10-CM | POA: Insufficient documentation

## 2023-12-21 DIAGNOSIS — G894 Chronic pain syndrome: Secondary | ICD-10-CM | POA: Insufficient documentation

## 2023-12-21 DIAGNOSIS — G4733 Obstructive sleep apnea (adult) (pediatric): Secondary | ICD-10-CM | POA: Diagnosis not present

## 2023-12-21 DIAGNOSIS — G479 Sleep disorder, unspecified: Secondary | ICD-10-CM | POA: Insufficient documentation

## 2023-12-21 DIAGNOSIS — F411 Generalized anxiety disorder: Secondary | ICD-10-CM | POA: Diagnosis not present

## 2023-12-22 DIAGNOSIS — C44729 Squamous cell carcinoma of skin of left lower limb, including hip: Secondary | ICD-10-CM | POA: Diagnosis not present

## 2023-12-22 DIAGNOSIS — L905 Scar conditions and fibrosis of skin: Secondary | ICD-10-CM | POA: Diagnosis not present

## 2023-12-22 DIAGNOSIS — D692 Other nonthrombocytopenic purpura: Secondary | ICD-10-CM | POA: Diagnosis not present

## 2023-12-22 DIAGNOSIS — D485 Neoplasm of uncertain behavior of skin: Secondary | ICD-10-CM | POA: Diagnosis not present

## 2023-12-22 DIAGNOSIS — L821 Other seborrheic keratosis: Secondary | ICD-10-CM | POA: Diagnosis not present

## 2023-12-22 DIAGNOSIS — L570 Actinic keratosis: Secondary | ICD-10-CM | POA: Diagnosis not present

## 2023-12-22 DIAGNOSIS — L820 Inflamed seborrheic keratosis: Secondary | ICD-10-CM | POA: Diagnosis not present

## 2023-12-22 DIAGNOSIS — Z85828 Personal history of other malignant neoplasm of skin: Secondary | ICD-10-CM | POA: Diagnosis not present

## 2023-12-22 NOTE — Progress Notes (Signed)
 Neuropsychology Visit  Patient:  Mary Buck   DOB: September 19, 1935  MR Number: [id number]  Location: [facility] [place] [facility] [address] [place], [address] [place] [address] Dept: [phone]  Date of Service: 12/21/2023  Start: 3 PM End: 4 PM  Today's visit was an in person visit was conducted in my outpatient clinic office.  The patient myself were present.  Duration of Service: 1 Hour  Provider/Observer:     Hershal Coria PsyD  Chief Complaint:      Chief Complaint  Patient presents with   Anxiety   Depression   Gait Problem   Pain   Fall    Reason For Service:     Mary Buck is an 88 year old female who has a history of anxiety and depressive symptomatology going back to adolescence.  Patient has had issues with insomnia since age 10 that were started around the time when she had an encounter with a snake and stayed up all night worrying that she had been bitten with a venomous bite despite absence of physical signs or symptoms.  OCD and anxiety have persisted throughout life.  Patient had a recent neuropsychological evaluation due to concerns of cognitive changes but did quite well on almost all neuropsychological measures and memory issues are likely related to anxiety and normal age-related changes.  The patient has been followed by Dr. Vella Kohler for psychotherapeutic interventions and after he left the practice she is following up with myself.  Treatment Interventions:  Cognitive/behavioral psychotherapeutic interventions or any issues related to generalized anxiety and coping and stress.  Participation Level:   Active  Participation Quality:  Appropriate      Behavioral Observation:  Well Groomed, Alert, and Appropriate.   Current Psychosocial Factors: Patient continues with some significant stress primarily caused by chronic pain symptoms.  Content of Session:   Reviewed  current symptoms and worked on therapeutic interventions around issues of anxiety and depressive symptomatology and coping with various stressors in her life.  Effectiveness of Interventions: Report has begun to be established and the patient has been open and engaging throughout the therapeutic efforts and we are working on transitioning from previous psychologist.  Target Goals:   We continue to work issues related to coping skills around her anxiety and adjustment to significant changes in physical functioning.  Patient continues with significant back pain and has an appointment coming up with her orthopedist.  Patient has been diagnosed with obstructive sleep apnea but has not been able to tolerate a CPAP machine when she was initially in the fitting.  And will also be important to try to get the patient to give the CPAP device another effort as it may have a beneficial effect for her anxiety, memory and depressive symptomatology.  Goals Last Reviewed:   12/21/2023  Goals Addressed Today:    Today we continue to work on coping skills and strategies around anxiety and depressive types of symptoms and dealing with significant chronic pain issues and coping with aging.  Impression/Diagnosis:   Mary Buck is an 88 year old female who has a history of anxiety and depressive symptomatology going back to adolescence.  Patient has had issues with insomnia since age 89 that were started around the time when she had an encounter with a snake and stayed up all night worrying that she had been bitten with a venomous bite despite absence of physical signs or symptoms.  OCD and anxiety have  persisted throughout life.  Patient had a recent neuropsychological evaluation due to concerns of cognitive changes but did quite well on almost all neuropsychological measures and memory issues are likely related to anxiety and normal age-related changes.  The patient has been followed by Dr. Vella Kohler for psychotherapeutic  interventions and after he left the practice she is following up with myself.  Patient had a fall yesterday and has had more recent fall as well.  Diagnosis:   Moderate episode of recurrent major depressive disorder (HCC)  OSA (obstructive sleep apnea)  Chronic pain syndrome  Generalized anxiety disorder  Sleep disturbance    Arley Phenix, Psy.D. Clinical Psychologist Neuropsychologist

## 2024-01-04 DIAGNOSIS — G894 Chronic pain syndrome: Secondary | ICD-10-CM | POA: Diagnosis not present

## 2024-01-04 DIAGNOSIS — Z79899 Other long term (current) drug therapy: Secondary | ICD-10-CM | POA: Diagnosis not present

## 2024-01-04 DIAGNOSIS — M48062 Spinal stenosis, lumbar region with neurogenic claudication: Secondary | ICD-10-CM | POA: Diagnosis not present

## 2024-01-04 DIAGNOSIS — M47816 Spondylosis without myelopathy or radiculopathy, lumbar region: Secondary | ICD-10-CM | POA: Diagnosis not present

## 2024-01-05 ENCOUNTER — Telehealth: Payer: Self-pay | Admitting: Family Medicine

## 2024-01-05 NOTE — Telephone Encounter (Signed)
 Copied from CRM [id number]. Topic: Medicare AWV >> Jan 05, 2024 11:00 AM Juliana Ocean wrote: Reason for CRM: Called LVM 01/05/2024 to schedule AWV. Please schedule Virtual or Telehealth visits ONLY.   Rosalee Collins; Care Guide Ambulatory Clinical Support [place] [facility] Direct Dial: [phone]

## 2024-01-11 DIAGNOSIS — H02201 Unspecified lagophthalmos right upper eyelid: Secondary | ICD-10-CM | POA: Diagnosis not present

## 2024-01-11 DIAGNOSIS — H35373 Puckering of macula, bilateral: Secondary | ICD-10-CM | POA: Diagnosis not present

## 2024-01-11 DIAGNOSIS — H18513 Endothelial corneal dystrophy, bilateral: Secondary | ICD-10-CM | POA: Diagnosis not present

## 2024-01-11 DIAGNOSIS — H04123 Dry eye syndrome of bilateral lacrimal glands: Secondary | ICD-10-CM | POA: Diagnosis not present

## 2024-01-11 DIAGNOSIS — H02204 Unspecified lagophthalmos left upper eyelid: Secondary | ICD-10-CM | POA: Diagnosis not present

## 2024-01-11 DIAGNOSIS — H353131 Nonexudative age-related macular degeneration, bilateral, early dry stage: Secondary | ICD-10-CM | POA: Diagnosis not present

## 2024-01-15 ENCOUNTER — Telehealth: Payer: Self-pay

## 2024-01-15 NOTE — Telephone Encounter (Signed)
 Initial Comment Caller states she was calling about an appointment. They called her and transferred her to the triage nurse. She didn't need a nurse. Additional Comment Caller was not sure why they transferred her to the Triage nurse and stated she didn't need help. Translation No Disp. Time Redgie Cancer Time) Disposition Final User 01/15/2024 4:02:11 PM General Information Provided Yes Borg-Gorbutt, DeShawn Final Disposition 01/15/2024 4:02:11 PM General Information Provided Yes Borg-Gorbutt, DeShawn

## 2024-01-17 NOTE — Progress Notes (Unsigned)
 [facility] [address], [address] [place], [place] [address] [phone]  Date:  01/20/2024   Name:  Mary Buck   DOB:  [birth year]   MRN:  [id number]  PCP:  Kaylee Partridge, MD    Chief Complaint: No chief complaint on file.   History of Present Illness:  Mary Buck is a 88 y.o. very pleasant female patient who presents with the following:  Patient seen today with concern about problem with her leg Most recent visit with myself was in January She has history of osteopenia, degenerative disc disease in her back and spinal stenosis, bladder cancer, fairly extensive lower extremity skin cancer, hypothyroidism, hyperlipidemia, asthma and allergies   Lab Results  Component Value Date   TSH 2.37 08/26/2023  '  Patient Active Problem List   Diagnosis Date Noted   S/P hip replacement, left 01/20/2023   Thyroid  disease    Heart murmur    GERD (gastroesophageal reflux disease)    Depression    Cataract    Cancer (HCC)    Asthma    Allergy    IDA (iron deficiency anemia) 05/31/2020   Osteopenia 05/30/2019   Pedal edema 01/31/2018   Squamous cell carcinoma of skin of left lower limb, including hip 12/16/2017   Plantar fasciitis 07/07/2017   DDD (degenerative disc disease), lumbar 06/01/2017   Fibromyalgia 06/01/2017   Spinal stenosis at L4-L5 level 04/30/2017   Peripheral neuropathy 04/30/2017   Encounter for therapeutic drug monitoring 03/05/2017   Bladder cancer (HCC) 08/20/2015   Arthropathy of lumbar facet joint 07/27/2015   Spinal stenosis of cervical region 07/27/2015   Spondylolisthesis 07/27/2015   Hypothyroid 07/05/2015   Abdominal pain, acute, left upper quadrant 06/28/2015   Basal cell carcinoma, face 06/28/2015   Bilateral groin pain 06/28/2015   Chronic pain 06/28/2015   Fatigue 06/28/2015   Hyperlipidemia 06/28/2015   Bilateral hip pain 06/28/2015   Paresthesia of lower lip 06/28/2015    Pulmonary nodule 06/28/2015   Pustulosis palmaris et plantaris 06/28/2015   Ulcer of nose 06/28/2015   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   Mass of parotid gland 03/24/2014    Past Medical History:  Diagnosis Date   Allergy    Asthma    as teen   Cataract    DDD (degenerative disc disease), lumbar 06/01/2017   Depression    GERD (gastroesophageal reflux disease)    Heart murmur    no issues   Hyperlipidemia    Hypothyroidism    IDA (iron deficiency anemia) 05/31/2020   Peripheral neuropathy 04/30/2017   Skin cancer    left leg had radiation, bladder cancer   Sleep apnea    no CPAP   Thyroid  disease     Past Surgical History:  Procedure Laterality Date   bladder cancer  2013   CARPAL TUNNEL RELEASE  2015   CATARACT EXTRACTION Bilateral 2012   CHOLECYSTECTOMY     COLONOSCOPY WITH ESOPHAGOGASTRODUODENOSCOPY (EGD)  02/22/2018   [facility]   SALIVARY GLAND SURGERY Left 2015   benign   SPINE SURGERY  6/31/21   vertaflex   TONSILLECTOMY AND ADENOIDECTOMY  1943   TOTAL HIP ARTHROPLASTY Left 01/20/2023   Procedure: TOTAL HIP ARTHROPLASTY;  Surgeon: Osa Blase, MD;  Location: [facility];  Service: Orthopedics;  Laterality: Left;    Social History   Tobacco Use   Smoking status: Former    Current packs/day: 0.00  Types: Cigarettes    Quit date: 2015    Years since quitting: 10.3   Smokeless tobacco: Never  Vaping Use   Vaping status: Never Used  Substance Use Topics   Alcohol  use: No   Drug use: No    Family History  Problem Relation Age of Onset   Macular degeneration Mother    Hyperlipidemia Father    Stroke Father    Cancer Brother        ? stomach   Prostate cancer Other        in brothers   Colon cancer Neg Hx     Allergies  Allergen Reactions   Contrast Media [Iodinated Contrast Media] Anaphylaxis   Other Anaphylaxis    Sulfa    X-ray diagnostic materials   Oxycodone Anaphylaxis    "my throat swells with any opioid"  (tolerated hydrocodone  01/21/23)   Latex Other (See Comments)    Other reaction(s): blistering, redness    Mirtazapine Nausea Only   Sulfur Other (See Comments)   Acitretin Rash   Cephalexin Other (See Comments)    Unknown (tolerated Ancef  01/21/23)   Elemental Sulfur Other (See Comments)    unknown   Penicillins Other (See Comments)    Unknown   Sulfa Antibiotics Other (See Comments)    Unknown    Medication list has been reviewed and updated.  Current Outpatient Medications on File Prior to Visit  Medication Sig Dispense Refill   Carboxymethylcellulose Sod PF (LUBRICANT EYE DROPS PF) 0.5 % SOLN Place 1 drop into both eyes 4 (four) times daily as needed (dry eyes).     Carboxymethylcellulose Sodium (REFRESH LIQUIGEL) 1 % GEL Place 1 drop into both eyes at bedtime.     famotidine  (PEPCID ) 20 MG tablet Take 1 tablet (20 mg total) by mouth daily. 90 tablet 1   gabapentin  (NEURONTIN ) 300 MG capsule Take 1 capsule (300 mg total) by mouth 3 (three) times daily. (Patient taking differently: Take 300 mg by mouth 2 (two) times daily before lunch and supper.) 90 capsule 4   lansoprazole  (PREVACID ) 30 MG capsule Take 1 capsule (30 mg total) by mouth 2 (two) times daily. 180 capsule 1   levothyroxine  (SYNTHROID ) 88 MCG tablet Take 1 tablet (88 mcg total) by mouth daily before breakfast. 90 tablet 1   misoprostol  (CYTOTEC ) 100 MCG tablet TAKE 2 TABLETS BY MOUTH AFTER BREAKFAST AND AFTER DINNER AS DIRECTED. Keep your scheduled appointment. 120 tablet 5   mupirocin  ointment (BACTROBAN ) 2 % Apply 1 Application topically 2 (two) times daily. Use as needed for nose sores 22 g 0   Probiotic Product (RISAQUAD PO) Take 1 capsule by mouth daily.     simvastatin  (ZOCOR ) 20 MG tablet TAKE 1 TABLET(20 MG) BY MOUTH DAILY 90 tablet 3   Vitamin D-Vitamin K (VITAMIN K2-VITAMIN D3 PO) Take 1 tablet by mouth daily.     No current facility-administered medications on file prior to visit.    Review of Systems:  As  per HPI- otherwise negative.   Physical Examination: There were no vitals filed for this visit. There were no vitals filed for this visit. There is no height or weight on file to calculate BMI. Ideal Body Weight:    GEN: no acute distress. HEENT: Atraumatic, Normocephalic.  Ears and Nose: No external deformity. CV: RRR, No M/G/R. No JVD. No thrill. No extra heart sounds. PULM: CTA B, no wheezes, crackles, rhonchi. No retractions. No resp. distress. No accessory muscle use. ABD: S, NT, ND, +  BS. No rebound. No HSM. EXTR: No c/c/e PSYCH: Normally interactive. Conversant.    Assessment and Plan: ***  Signed Gates Kasal, MD

## 2024-01-18 ENCOUNTER — Encounter: Payer: Medicare Other | Attending: Psychology | Admitting: Psychology

## 2024-01-18 DIAGNOSIS — G4733 Obstructive sleep apnea (adult) (pediatric): Secondary | ICD-10-CM | POA: Diagnosis not present

## 2024-01-18 DIAGNOSIS — F411 Generalized anxiety disorder: Secondary | ICD-10-CM | POA: Insufficient documentation

## 2024-01-18 DIAGNOSIS — G479 Sleep disorder, unspecified: Secondary | ICD-10-CM | POA: Insufficient documentation

## 2024-01-18 DIAGNOSIS — F331 Major depressive disorder, recurrent, moderate: Secondary | ICD-10-CM | POA: Diagnosis not present

## 2024-01-18 DIAGNOSIS — G894 Chronic pain syndrome: Secondary | ICD-10-CM | POA: Insufficient documentation

## 2024-01-18 NOTE — Progress Notes (Signed)
 Neuropsychology Visit  Patient:  Mary Buck   DOB: 07/06/1936  MR Number: [id number]  Location: [facility] [place] [facility] MEDICINE AND REHABILITATION [address] [place], [address] [place] [address] Dept: [phone]  Date of Service: 01/18/2024  Start: 3 PM End: 4 PM  Today's visit was an in person visit was conducted in my outpatient clinic office.  The patient myself were present.  Duration of Service: 1 Hour  Provider/Observer:     Marrion Sjogren PsyD  Chief Complaint:      Chief Complaint  Patient presents with   Anxiety   Depression   Gait Problem   Pain   Fall    Reason For Service:     Mary Buck is an 88 year old female who has a history of anxiety and depressive symptomatology going back to adolescence.  Patient has had issues with insomnia since age 29 that were started around the time when she had an encounter with a snake and stayed up all night worrying that she had been bitten with a venomous bite despite absence of physical signs or symptoms.  OCD and anxiety have persisted throughout life.  Patient had a recent neuropsychological evaluation due to concerns of cognitive changes but did quite well on almost all neuropsychological measures and memory issues are likely related to anxiety and normal age-related changes.  The patient has been followed by Dr. Zusman for psychotherapeutic interventions and after he left the practice she is following up with myself.  Treatment Interventions:  Cognitive/behavioral psychotherapeutic interventions or any issues related to generalized anxiety and coping and stress.  Participation Level:   Active  Participation Quality:  Appropriate      Behavioral Observation:  Well Groomed, Alert, and Appropriate.   Current Psychosocial Factors: Patient continues to struggle with getting out and having significant social interactions due to her pain and limitations.   Patient is able to spend quality time at home but is having progressive challenges with active outside activities.  Content of Session:   Reviewed current symptoms and worked on therapeutic interventions around issues of anxiety and depressive symptomatology and coping with various stressors in her life.  Effectiveness of Interventions: Report has begun to be established and the patient has been open and engaging throughout the therapeutic efforts and we are working on transitioning from previous psychologist.  Target Goals:   We continue to work issues related to coping skills around her anxiety and adjustment to significant changes in physical functioning.  Patient continues with significant back pain and gait changes that limit her physical activity.  Patient has been diagnosed with obstructive sleep apnea but has not been able to tolerate a CPAP machine when she was initially in the fitting.  The patient's loss of function continues to be quite problematic.  The patient continues to do very well cognitively with good memory, reasoning and executive functioning etc.  Goals Last Reviewed:   01/18/2024  Goals Addressed Today:    Today we continue to work on coping skills and strategies around anxiety and depressive types of symptoms and dealing with significant chronic pain issues and coping with aging.  Impression/Diagnosis:   Mary Buck is an 88 year old female who has a history of anxiety and depressive symptomatology going back to adolescence.  Patient has had issues with insomnia since age 33 that were started around the time when she had an encounter with a snake and stayed up all night worrying that she  had been bitten with a venomous bite despite absence of physical signs or symptoms.  OCD and anxiety have persisted throughout life.  Patient had a recent neuropsychological evaluation due to concerns of cognitive changes but did quite well on almost all neuropsychological measures and memory  issues are likely related to anxiety and normal age-related changes.  The patient has been followed by Dr. Zusman for psychotherapeutic interventions and after he left the practice she is following up with myself.  We have continued to work on therapeutic interventions around managing chronic pain and degenerative back symptoms along with anxiety and depression and stress over loss of function.  Diagnosis:   Moderate episode of recurrent major depressive disorder (HCC)  OSA (obstructive sleep apnea)  Chronic pain syndrome  Generalized anxiety disorder  Sleep disturbance    Chapman Commodore, Psy.D. Clinical Psychologist Neuropsychologist

## 2024-01-20 ENCOUNTER — Ambulatory Visit (HOSPITAL_BASED_OUTPATIENT_CLINIC_OR_DEPARTMENT_OTHER)
Admission: RE | Admit: 2024-01-20 | Discharge: 2024-01-20 | Disposition: A | Discharge status: Home / Self Care | Source: Ambulatory Visit | Attending: Family Medicine | Admitting: Family Medicine

## 2024-01-20 ENCOUNTER — Encounter: Payer: Self-pay | Admitting: Family Medicine

## 2024-01-20 ENCOUNTER — Ambulatory Visit (INDEPENDENT_AMBULATORY_CARE_PROVIDER_SITE_OTHER): Admitting: Family Medicine

## 2024-01-20 VITALS — BP 122/70 | HR 67 | Ht 63.0 in | Wt 150.0 lb

## 2024-01-20 DIAGNOSIS — M7989 Other specified soft tissue disorders: Secondary | ICD-10-CM

## 2024-01-20 DIAGNOSIS — G479 Sleep disorder, unspecified: Secondary | ICD-10-CM

## 2024-01-20 DIAGNOSIS — Z5181 Encounter for therapeutic drug level monitoring: Secondary | ICD-10-CM | POA: Diagnosis not present

## 2024-01-20 DIAGNOSIS — E039 Hypothyroidism, unspecified: Secondary | ICD-10-CM | POA: Diagnosis not present

## 2024-01-20 DIAGNOSIS — M79605 Pain in left leg: Secondary | ICD-10-CM | POA: Diagnosis not present

## 2024-01-20 LAB — COMPREHENSIVE METABOLIC PANEL WITH GFR
ALT: 13 U/L (ref 0–35)
AST: 16 U/L (ref 0–37)
Albumin: 4.1 g/dL (ref 3.5–5.2)
Alkaline Phosphatase: 65 U/L (ref 39–117)
BUN: 13 mg/dL (ref 6–23)
CO2: 33 meq/L — ABNORMAL HIGH (ref 19–32)
Calcium: 8.8 mg/dL (ref 8.4–10.5)
Chloride: 104 meq/L (ref 96–112)
Creatinine, Ser: 0.81 mg/dL (ref 0.40–1.20)
GFR: 66.22 mL/min (ref >60.00)
Glucose, Bld: 80 mg/dL (ref 70–99)
Potassium: 3.5 meq/L (ref 3.5–5.1)
Sodium: 143 meq/L (ref 135–145)
Total Bilirubin: 0.5 mg/dL (ref 0.2–1.2)
Total Protein: 6.4 g/dL (ref 6.0–8.3)

## 2024-01-20 LAB — TSH: TSH: 2.99 u[IU]/mL (ref 0.35–5.50)

## 2024-01-20 NOTE — Patient Instructions (Addendum)
 Good to see you today- please stop by imaging on the ground floor and set up an ultrasound for your left leg to make sure no blood clot in your leg Otherwise please see me in about 6 months

## 2024-01-24 MED ORDER — DOXYCYCLINE HYCLATE 100 MG PO CAPS
100.0000 mg | ORAL_CAPSULE | Freq: Two times a day (BID) | ORAL | 0 refills | Status: DC
Start: 2024-01-24 — End: 2024-04-12

## 2024-02-04 DIAGNOSIS — Z85828 Personal history of other malignant neoplasm of skin: Secondary | ICD-10-CM | POA: Diagnosis not present

## 2024-02-04 DIAGNOSIS — L905 Scar conditions and fibrosis of skin: Secondary | ICD-10-CM | POA: Diagnosis not present

## 2024-02-04 DIAGNOSIS — L821 Other seborrheic keratosis: Secondary | ICD-10-CM | POA: Diagnosis not present

## 2024-02-04 DIAGNOSIS — L820 Inflamed seborrheic keratosis: Secondary | ICD-10-CM | POA: Diagnosis not present

## 2024-02-17 ENCOUNTER — Telehealth: Payer: Self-pay | Admitting: Family Medicine

## 2024-02-17 NOTE — Telephone Encounter (Signed)
 Copied from CRM [id number]. Topic: Medicare AWV >> Feb 17, 2024  2:28 PM Juliana Ocean wrote: Reason for CRM: Called 02/17/2024 to sched AWV - UNABLE TO LEAVE A MESSAGE  Rosalee Collins; Care Guide Ambulatory Clinical Support [place] [facility] Direct Dial: [phone]

## 2024-02-18 ENCOUNTER — Encounter: Payer: Self-pay | Admitting: Family Medicine

## 2024-02-18 DIAGNOSIS — R2681 Unsteadiness on feet: Secondary | ICD-10-CM

## 2024-02-18 DIAGNOSIS — M545 Low back pain, unspecified: Secondary | ICD-10-CM

## 2024-02-22 ENCOUNTER — Encounter: Payer: Self-pay | Admitting: Family

## 2024-02-23 ENCOUNTER — Other Ambulatory Visit: Payer: Self-pay | Admitting: Family Medicine

## 2024-02-24 NOTE — Progress Notes (Signed)
 Neuropsychology Visit  Patient:  Mary Buck   DOB: 04-25-1936  MR Number: [id number]  Location: [facility]  PHYSICAL MEDICINE AND REHABILITATION [address] [place], [address] [place] [address] Dept: [phone]  Date of Service: 11/19/2023  Start: 11 AM End: 12 PM  Today's visit was an in person visit was conducted in my outpatient clinic office.  The patient myself were present.  Duration of Service: 1 Hour  Provider/Observer:     Marrion Sjogren PsyD  Chief Complaint:      Chief Complaint  Patient presents with   Anxiety   Depression   Gait Problem   Fall   Pain    Reason For Service:     Mary Buck is an 88 year old female who has a history of anxiety and depressive symptomatology going back to adolescence.  Patient has had issues with insomnia since age 28 that were started around the time when she had an encounter with a snake and stayed up all night worrying that she had been bitten with a venomous bite despite absence of physical signs or symptoms.  OCD and anxiety have persisted throughout life.  Patient had a recent neuropsychological evaluation due to concerns of cognitive changes but did quite well on almost all neuropsychological measures and memory issues are likely related to anxiety and normal age-related changes.  The patient has been followed by Dr. Zusman for psychotherapeutic interventions and after he left the practice she is following up with myself.  Treatment Interventions:  Cognitive/behavioral psychotherapeutic interventions or any issues related to generalized anxiety and coping and stress.  Participation Level:   Active  Participation Quality:  Appropriate      Behavioral Observation:  Well Groomed, Alert, and Appropriate.   Current Psychosocial Factors: Patient continues with some significant stress primarily caused by chronic pain symptoms.  Content of Session:   Reviewed  current symptoms and worked on therapeutic interventions around issues of anxiety and depressive symptomatology and coping with various stressors in her life.  Effectiveness of Interventions: Report has begun to be established and the patient has been open and engaging throughout the therapeutic efforts and we are working on transitioning from previous psychologist.  Target Goals:   We continue to work issues related to coping skills around her anxiety and adjustment to significant changes in physical functioning.  Patient continues with significant back pain and has an appointment coming up with her orthopedist.  Patient has been diagnosed with obstructive sleep apnea but has not been able to tolerate a CPAP machine when she was initially in the fitting.  And will also be important to try to get the patient to give the CPAP device another effort as it may have a beneficial effect for her anxiety, memory and depressive symptomatology.  Goals Last Reviewed:   11/19/2023  Goals Addressed Today:    Today we continue to work on coping skills and strategies around anxiety and depressive types of symptoms and dealing with significant chronic pain issues and coping with aging.  Impression/Diagnosis:   Mary Buck is an 88 year old female who has a history of anxiety and depressive symptomatology going back to adolescence.  Patient has had issues with insomnia since age 87 that were started around the time when she had an encounter with a snake and stayed up all night worrying that she had been bitten with a venomous bite despite absence of physical signs or symptoms.  OCD and anxiety have  persisted throughout life.  Patient had a recent neuropsychological evaluation due to concerns of cognitive changes but did quite well on almost all neuropsychological measures and memory issues are likely related to anxiety and normal age-related changes.  The patient has been followed by Dr. Zusman for psychotherapeutic  interventions and after he left the practice she is following up with myself.  Patient denies any recent fall but has had fall in the recent past.  Diagnosis:   Moderate episode of recurrent major depressive disorder (HCC)  OSA (obstructive sleep apnea)  Chronic pain syndrome  Generalized anxiety disorder  Sleep disturbance    Chapman Commodore, Psy.D. Clinical Psychologist Neuropsychologist

## 2024-02-25 DIAGNOSIS — G8929 Other chronic pain: Secondary | ICD-10-CM | POA: Diagnosis not present

## 2024-02-25 DIAGNOSIS — R2681 Unsteadiness on feet: Secondary | ICD-10-CM | POA: Diagnosis not present

## 2024-02-25 DIAGNOSIS — M545 Low back pain, unspecified: Secondary | ICD-10-CM | POA: Diagnosis not present

## 2024-03-04 DIAGNOSIS — G8929 Other chronic pain: Secondary | ICD-10-CM | POA: Diagnosis not present

## 2024-03-04 DIAGNOSIS — R2681 Unsteadiness on feet: Secondary | ICD-10-CM | POA: Diagnosis not present

## 2024-03-04 DIAGNOSIS — M545 Low back pain, unspecified: Secondary | ICD-10-CM | POA: Diagnosis not present

## 2024-03-07 DIAGNOSIS — G8929 Other chronic pain: Secondary | ICD-10-CM | POA: Diagnosis not present

## 2024-03-07 DIAGNOSIS — R2681 Unsteadiness on feet: Secondary | ICD-10-CM | POA: Diagnosis not present

## 2024-03-07 DIAGNOSIS — M545 Low back pain, unspecified: Secondary | ICD-10-CM | POA: Diagnosis not present

## 2024-03-08 ENCOUNTER — Other Ambulatory Visit: Payer: Self-pay | Admitting: Gastroenterology

## 2024-03-14 DIAGNOSIS — G8929 Other chronic pain: Secondary | ICD-10-CM | POA: Diagnosis not present

## 2024-03-14 DIAGNOSIS — M545 Low back pain, unspecified: Secondary | ICD-10-CM | POA: Diagnosis not present

## 2024-03-14 DIAGNOSIS — R2681 Unsteadiness on feet: Secondary | ICD-10-CM | POA: Diagnosis not present

## 2024-03-16 ENCOUNTER — Emergency Department (HOSPITAL_BASED_OUTPATIENT_CLINIC_OR_DEPARTMENT_OTHER)

## 2024-03-16 ENCOUNTER — Emergency Department (HOSPITAL_BASED_OUTPATIENT_CLINIC_OR_DEPARTMENT_OTHER)
Admission: EM | Admit: 2024-03-16 | Discharge: 2024-03-16 | Disposition: A | Discharge status: Home / Self Care | Attending: Emergency Medicine | Admitting: Emergency Medicine

## 2024-03-16 ENCOUNTER — Encounter (HOSPITAL_BASED_OUTPATIENT_CLINIC_OR_DEPARTMENT_OTHER): Payer: Self-pay | Admitting: Emergency Medicine

## 2024-03-16 ENCOUNTER — Other Ambulatory Visit: Payer: Self-pay

## 2024-03-16 DIAGNOSIS — M40204 Unspecified kyphosis, thoracic region: Secondary | ICD-10-CM | POA: Diagnosis not present

## 2024-03-16 DIAGNOSIS — I700 Atherosclerosis of aorta: Secondary | ICD-10-CM | POA: Diagnosis not present

## 2024-03-16 DIAGNOSIS — S61411A Laceration without foreign body of right hand, initial encounter: Secondary | ICD-10-CM | POA: Insufficient documentation

## 2024-03-16 DIAGNOSIS — S299XXA Unspecified injury of thorax, initial encounter: Secondary | ICD-10-CM | POA: Diagnosis not present

## 2024-03-16 DIAGNOSIS — S22089A Unspecified fracture of T11-T12 vertebra, initial encounter for closed fracture: Secondary | ICD-10-CM | POA: Diagnosis not present

## 2024-03-16 DIAGNOSIS — S301XXA Contusion of abdominal wall, initial encounter: Secondary | ICD-10-CM | POA: Insufficient documentation

## 2024-03-16 DIAGNOSIS — M4804 Spinal stenosis, thoracic region: Secondary | ICD-10-CM | POA: Diagnosis not present

## 2024-03-16 DIAGNOSIS — S199XXA Unspecified injury of neck, initial encounter: Secondary | ICD-10-CM | POA: Diagnosis not present

## 2024-03-16 DIAGNOSIS — W182XXA Fall in (into) shower or empty bathtub, initial encounter: Secondary | ICD-10-CM | POA: Diagnosis not present

## 2024-03-16 DIAGNOSIS — S22009A Unspecified fracture of unspecified thoracic vertebra, initial encounter for closed fracture: Secondary | ICD-10-CM

## 2024-03-16 DIAGNOSIS — W19XXXA Unspecified fall, initial encounter: Secondary | ICD-10-CM

## 2024-03-16 DIAGNOSIS — M4310 Spondylolisthesis, site unspecified: Secondary | ICD-10-CM | POA: Diagnosis not present

## 2024-03-16 DIAGNOSIS — S51811A Laceration without foreign body of right forearm, initial encounter: Secondary | ICD-10-CM | POA: Diagnosis not present

## 2024-03-16 DIAGNOSIS — Z91040 Latex allergy status: Secondary | ICD-10-CM | POA: Insufficient documentation

## 2024-03-16 DIAGNOSIS — R109 Unspecified abdominal pain: Secondary | ICD-10-CM | POA: Diagnosis present

## 2024-03-16 DIAGNOSIS — M4802 Spinal stenosis, cervical region: Secondary | ICD-10-CM | POA: Diagnosis not present

## 2024-03-16 DIAGNOSIS — M5030 Other cervical disc degeneration, unspecified cervical region: Secondary | ICD-10-CM | POA: Diagnosis not present

## 2024-03-16 DIAGNOSIS — S60811A Abrasion of right wrist, initial encounter: Secondary | ICD-10-CM | POA: Diagnosis not present

## 2024-03-16 DIAGNOSIS — M51369 Other intervertebral disc degeneration, lumbar region without mention of lumbar back pain or lower extremity pain: Secondary | ICD-10-CM | POA: Diagnosis not present

## 2024-03-16 DIAGNOSIS — M5127 Other intervertebral disc displacement, lumbosacral region: Secondary | ICD-10-CM | POA: Diagnosis not present

## 2024-03-16 DIAGNOSIS — M19031 Primary osteoarthritis, right wrist: Secondary | ICD-10-CM | POA: Diagnosis not present

## 2024-03-16 DIAGNOSIS — S22088A Other fracture of T11-T12 vertebra, initial encounter for closed fracture: Secondary | ICD-10-CM | POA: Diagnosis not present

## 2024-03-16 DIAGNOSIS — S52611A Displaced fracture of right ulna styloid process, initial encounter for closed fracture: Secondary | ICD-10-CM | POA: Diagnosis not present

## 2024-03-16 DIAGNOSIS — M47816 Spondylosis without myelopathy or radiculopathy, lumbar region: Secondary | ICD-10-CM | POA: Diagnosis not present

## 2024-03-16 DIAGNOSIS — D3502 Benign neoplasm of left adrenal gland: Secondary | ICD-10-CM | POA: Diagnosis not present

## 2024-03-16 MED ORDER — LIDOCAINE 5 % EX PTCH
1.0000 | MEDICATED_PATCH | Freq: Once | CUTANEOUS | Status: DC
  Administered 2024-03-16: 1 via TRANSDERMAL
  Filled 2024-03-16: qty 1

## 2024-03-16 MED ORDER — LIDOCAINE 5 % EX PTCH: 1.0000 | MEDICATED_PATCH | CUTANEOUS | 0 refills | Status: DC

## 2024-03-16 MED ORDER — MORPHINE SULFATE (PF) 2 MG/ML IV SOLN
2.0000 mg | Freq: Once | INTRAVENOUS | Status: AC
  Administered 2024-03-16: 2 mg via INTRAMUSCULAR
  Filled 2024-03-16: qty 1

## 2024-03-16 NOTE — ED Provider Notes (Signed)
 Clinical Course as of 03/16/24 1659  Wed Mar 16, 2024  1653 Received signout.  See morning team's note for full HPI.  Signed out with disposition pending CT scans.  CT scans have resulted as transverse process fracture of T11.  Patient made aware, is following with pain management outpatient.  Will give her a lidocaine  patches as she is not using those currently.  Skin tears to forearms dressed.  Discharged in stable condition. [TY]    Clinical Course User Index [TY] Neysa Caron PARAS, DO      Neysa Caron PARAS, [place] 03/16/24 1659

## 2024-03-16 NOTE — ED Triage Notes (Signed)
 An hour ago had a fall in shower , slipped right side , mid back pain , obvious bruising and skin abrasion to right ribcage . No head injury . , right hand injury , laceration to posterior right hand .  No blood thinners .  Alert and oriented x 4

## 2024-03-16 NOTE — ED Provider Notes (Addendum)
 [place] [facility] Provider Note   CSN: [id number] Arrival date & time: 03/16/24  1158     Patient presents with: Mary Buck is a 88 y.o. female.   HPI   88 year old female presents emergency department after mechanical fall in the shower.  Patient states she slipped and fell down onto her right side/back.  She had immediate pain.  She also sustained skin tears/abrasions to the right hand and wrist.  Did not hit her head, no loss of consciousness.  Currently complaining of right flank/back pain.  Denies any headache.  She has some mild tenderness regarding the skin tears around the right wrist but no other extremity deformity/injury.  Does not take blood thinning medication.  Prior to Admission medications   Medication Sig Start Date End Date Taking? Authorizing Provider  Carboxymethylcellulose Sod PF (LUBRICANT EYE DROPS PF) 0.5 % SOLN Place 1 drop into both eyes 4 (four) times daily as needed (dry eyes).    [provider]  Carboxymethylcellulose Sodium (REFRESH LIQUIGEL) 1 % GEL Place 1 drop into both eyes at bedtime.    [provider]  doxycycline  (VIBRAMYCIN ) 100 MG capsule Take 1 capsule (100 mg total) by mouth 2 (two) times daily. 01/24/24   Copland, Harlene JAYSON, MD  famotidine  (PEPCID ) 20 MG tablet Take 1 tablet (20 mg total) by mouth daily. 09/04/23   May, Deanna J, NP  gabapentin  (NEURONTIN ) 300 MG capsule Take 1 capsule (300 mg total) by mouth 3 (three) times daily. Patient taking differently: Take 300 mg by mouth 2 (two) times daily before lunch and supper. 04/30/17   Copland, Harlene JAYSON, MD  lansoprazole  (PREVACID ) 30 MG capsule Take 1 capsule (30 mg total) by mouth 2 (two) times daily. 09/18/23   May, Deanna J, NP  levothyroxine  (SYNTHROID ) 88 MCG tablet Take 1 tablet (88 mcg total) by mouth daily before breakfast. 02/23/24   Copland, Jessica C, MD  misoprostol  (CYTOTEC ) 100 MCG tablet TAKE 2 TABLET BY MOUTH AFTER  BREAKFAST AND AFTER DINNER AS DIRECTED. KEEP APPOINTMENT FOR FURTHER FILLS. 03/08/24   May, Deanna J, NP  mupirocin  ointment (BACTROBAN ) 2 % Apply 1 Application topically 2 (two) times daily. Use as needed for nose sores 12/06/23   Copland, Harlene JAYSON, MD  Probiotic Product (RISAQUAD PO) Take 1 capsule by mouth daily.    [provider]  simvastatin  (ZOCOR ) 20 MG tablet Take 1 tablet (20 mg total) by mouth daily. 02/23/24   Copland, Jessica C, MD  Vitamin D-Vitamin K (VITAMIN K2-VITAMIN D3 PO) Take 1 tablet by mouth daily.    [provider]    Allergies: Contrast media [iodinated contrast media], Other, Oxycodone, Latex, Mirtazapine, Sulfur, Acitretin, Cephalexin, Elemental sulfur, Penicillins, and Sulfa antibiotics    Review of Systems  Constitutional:  Negative for fever.  Respiratory:  Positive for shortness of breath.   Cardiovascular:  Negative for chest pain.  Gastrointestinal:  Negative for abdominal pain, diarrhea and vomiting.  Genitourinary:  Positive for flank pain.  Musculoskeletal:  Positive for back pain. Negative for neck pain.  Skin:  Positive for wound.  Neurological:  Negative for headaches.    Updated Vital Signs BP (!) 172/72   Pulse 64   Temp 97.6 F (36.4 C)   Resp 14   Wt 67.6 kg   SpO2 98%   BMI 26.39 kg/m   Physical Exam Vitals and nursing note reviewed.  Constitutional:      General: She  is not in acute distress. HENT:     Head: Normocephalic.     Comments: Midface is stable    Right Ear: External ear normal.     Left Ear: External ear normal.     Nose: Nose normal.     Comments: No septal hematoma Eyes:     Conjunctiva/sclera: Conjunctivae normal.     Pupils: Pupils are equal, round, and reactive to light.  Neck:     Comments: Mild right paraspinal cervical musculature tenderness to palpation, no midline spinal tenderness Cardiovascular:     Rate and Rhythm: Normal rate.  Pulmonary:     Effort: Pulmonary effort is normal.   Abdominal:     General: Abdomen is flat. There is no distension.     Palpations: Abdomen is soft.     Tenderness: There is no abdominal tenderness.     Comments: No seat belt sign  Musculoskeletal:        General: No deformity or signs of injury.     Comments: Pelvis is stable.  Bruising along the right flank with tenderness to palpation of the right posterior and lateral ribs as well as tenderness in the right upper quadrant  Skin:    General: Skin is warm.     Comments: 2 linear skin tears, 1 on the forearm and 1 on the dorsal aspect of the hand, bleeding controlled  Neurological:     General: No focal deficit present.     Mental Status: She is alert and oriented to person, place, and time.     (all labs ordered are listed, but only abnormal results are displayed) Labs Reviewed - No data to display  EKG: None  Radiology: No results found.   .Laceration Repair  Date/Time: 03/16/2024 2:57 PM  Performed by: Bari Roxie HERO, DO Authorized by: Bari Roxie HERO, DO   Consent:    Consent obtained:  Verbal   Consent given by:  Patient   Risks discussed:  Infection, poor cosmetic result and poor wound healing Universal protocol:    Patient identity confirmed:  Verbally with patient Laceration details:    Location:  Hand   Hand location:  R hand, dorsum   Length (cm):  5 Pre-procedure details:    Preparation:  Patient was prepped and draped in usual sterile fashion Exploration:    Hemostasis achieved with:  Direct pressure   Wound exploration: wound explored through full range of motion   Treatment:    Area cleansed with:  Saline   Amount of cleaning:  Standard   Irrigation solution:  Sterile water   Irrigation method:  Syringe   Debridement:  Minimal Skin repair:    Repair method:  Tissue adhesive and Steri-Strips   Number of Steri-Strips:  3 Approximation:    Approximation:  Loose Repair type:    Repair type:  Simple Post-procedure details:    Dressing:   Sterile dressing   Procedure completion:  Tolerated    Medications Ordered in the ED - No data to display                                  Medical Decision Making Amount and/or Complexity of Data Reviewed Radiology: ordered.  Risk Prescription drug management.   88 year old female presents emergency department after mechanical fall in the shower.  She fell down onto her right side is mainly complaining of bruising and pain along the right side.  She also had right wrist injury with 2 skin tears on the hand/forearm.  Bleeding controlled.  No anticoagulation, vitals are normal and stable.  No head injury or loss consciousness.  Right forearm skin tear is superficial, will be dressed wet-to-dry.  The right hand skin tear is a small area that is gaping.  This was cleaned, repaired with Steri-Strips and Dermabond and will be dressed as well.  No fracture on the right wrist x-ray.  Tdap noted to be up-to-date from 2021.  Patient is pending CT imaging for further evaluation.  Of note patient is listed and admits to an anaphylactic reaction to IV contrast dye.  Pretreatment would not be appropriate.  We discussed how CT imaging in a trauma setting can be limited with no IV contrast and they understand.  Requested a low-dose IM medication for pain control.    Patient signed out pending CT scans and re evaluation.      Final diagnoses:  None    ED Discharge Orders     None          Bari Roxie HERO, DO 03/16/24 1501    Bari Roxie HERO, DO 03/16/24 1512

## 2024-03-16 NOTE — Discharge Instructions (Signed)
 You have a T11 transverse process fracture.  Please follow-up with your primary doctor.  You may continue to use your home pain medications for pain control.  We are also prescribing lidocaine  patches.  Return immediately if develop fevers, chills, sudden onset headache, facial droop, chest pain, shortness of breath, abdominal pain with inability to eat or drink due to nausea vomiting or he develop any new or worsening symptoms that are concerning to you.  Please keep your skin tears on your forearm bandaged and covered.  You may wash them daily, but please recover them.  Return if they develop redness, foul odor or begin draining pus.

## 2024-03-20 NOTE — Patient Instructions (Incomplete)
 It was great to see you again today!  If you have not already I would recommend getting a dose of RSV vaccine at your pharmacy  Please stop by imaging on the ground floor to get x-rays of your right ribs.  Your hand appears to be healing up just fine  I will reach out to Dr. Buck at neurology and ask if she can bring you in to further discuss your excessive sleepiness symptoms.

## 2024-03-20 NOTE — Progress Notes (Unsigned)
 [facility] [address], [address] [place], [place] [address] [phone]  Date:  03/23/2024   Name:  Mary Buck   DOB:  12-21-1935   MRN:  [id number]  PCP:  Mary Harlene JAYSON, MD    Chief Complaint: No chief complaint on file.   History of Present Illness:  Mary Buck is a 88 y.o. very pleasant female patient who presents with the following:  Patient seen today for periodic follow-up.  Most recent visit with myself was in May  She has history of osteopenia, degenerative disc disease in her back and spinal stenosis, bladder cancer, fairly extensive lower extremity skin cancer, hypothyroidism, hyperlipidemia, asthma and allergies   At our visit in May Mary Buck was concerned that feeling very tired, she might even feel tired just after eating her breakfast She also admitted to having a hard time sleeping at night thus leading her very fatigued during the day I encouraged her to use naps during the day as needed to catch up on her rest  She has completed Shingrix ?  RSV  We did lab work in May including CMP, TSH CBC in February, normal Patient Active Problem List   Diagnosis Date Noted   S/P hip replacement, left 01/20/2023   Thyroid  disease    Heart murmur    GERD (gastroesophageal reflux disease)    Depression    Cataract    Cancer (HCC)    Asthma    Allergy    IDA (iron deficiency anemia) 05/31/2020   Osteopenia 05/30/2019   Pedal edema 01/31/2018   Squamous cell carcinoma of skin of left lower limb, including hip 12/16/2017   Plantar fasciitis 07/07/2017   DDD (degenerative disc disease), lumbar 06/01/2017   Fibromyalgia 06/01/2017   Spinal stenosis at L4-L5 level 04/30/2017   Peripheral neuropathy 04/30/2017   Encounter for therapeutic drug monitoring 03/05/2017   Bladder cancer (HCC) 08/20/2015   Arthropathy of lumbar facet joint 07/27/2015   Spinal stenosis of cervical region 07/27/2015    Spondylolisthesis 07/27/2015   Hypothyroid 07/05/2015   Abdominal pain, acute, left upper quadrant 06/28/2015   Basal cell carcinoma, face 06/28/2015   Bilateral groin pain 06/28/2015   Chronic pain 06/28/2015   Fatigue 06/28/2015   Hyperlipidemia 06/28/2015   Bilateral hip pain 06/28/2015   Paresthesia of lower lip 06/28/2015   Pulmonary nodule 06/28/2015   Pustulosis palmaris et plantaris 06/28/2015   Ulcer of nose 06/28/2015   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   Mass of parotid gland 03/24/2014    Past Medical History:  Diagnosis Date   Allergy    Asthma    as teen   Cataract    DDD (degenerative disc disease), lumbar 06/01/2017   Depression    GERD (gastroesophageal reflux disease)    Heart murmur    no issues   Hyperlipidemia    Hypothyroidism    IDA (iron deficiency anemia) 05/31/2020   Peripheral neuropathy 04/30/2017   Skin cancer    left leg had radiation, bladder cancer   Sleep apnea    no CPAP   Thyroid  disease     Past Surgical History:  Procedure Laterality Date   bladder cancer  2013   CARPAL TUNNEL RELEASE  2015   CATARACT EXTRACTION Bilateral 2012   CHOLECYSTECTOMY     COLONOSCOPY WITH ESOPHAGOGASTRODUODENOSCOPY (EGD)  02/22/2018   [facility]   SALIVARY GLAND SURGERY Left 2015   benign  SPINE SURGERY  6/31/21   vertaflex   TONSILLECTOMY AND ADENOIDECTOMY  1943   TOTAL HIP ARTHROPLASTY Left 01/20/2023   Procedure: TOTAL HIP ARTHROPLASTY;  Surgeon: Josefina Chew, MD;  Location: [facility];  Service: Orthopedics;  Laterality: Left;    Social History   Tobacco Use   Smoking status: Former    Current packs/day: 0.00    Types: Cigarettes    Quit date: 2015    Years since quitting: 10.5   Smokeless tobacco: Never  Vaping Use   Vaping status: Never Used  Substance Use Topics   Alcohol  use: No   Drug use: No    Family History  Problem Relation Age of Onset   Macular degeneration Mother    Hyperlipidemia Father     Stroke Father    Cancer Brother        ? stomach   Prostate cancer Other        in brothers   Colon cancer Neg Hx     Allergies  Allergen Reactions   Contrast Media [Iodinated Contrast Media] Anaphylaxis   Other Anaphylaxis    Sulfa    X-ray diagnostic materials   Oxycodone Anaphylaxis    my throat swells with any opioid (tolerated hydrocodone  01/21/23)   Latex Other (See Comments)    Other reaction(s): blistering, redness    Mirtazapine Nausea Only   Sulfur Other (See Comments)   Acitretin Rash   Cephalexin Other (See Comments)    Unknown (tolerated Ancef  01/21/23)   Elemental Sulfur Other (See Comments)    unknown   Penicillins Other (See Comments)    Unknown   Sulfa Antibiotics Other (See Comments)    Unknown    Medication list has been reviewed and updated.  Current Outpatient Medications on File Prior to Visit  Medication Sig Dispense Refill   Carboxymethylcellulose Sod PF (LUBRICANT EYE DROPS PF) 0.5 % SOLN Place 1 drop into both eyes 4 (four) times daily as needed (dry eyes).     Carboxymethylcellulose Sodium (REFRESH LIQUIGEL) 1 % GEL Place 1 drop into both eyes at bedtime.     doxycycline  (VIBRAMYCIN ) 100 MG capsule Take 1 capsule (100 mg total) by mouth 2 (two) times daily. 20 capsule 0   famotidine  (PEPCID ) 20 MG tablet Take 1 tablet (20 mg total) by mouth daily. 90 tablet 1   gabapentin  (NEURONTIN ) 300 MG capsule Take 1 capsule (300 mg total) by mouth 3 (three) times daily. (Patient taking differently: Take 300 mg by mouth 2 (two) times daily before lunch and supper.) 90 capsule 4   lansoprazole  (PREVACID ) 30 MG capsule Take 1 capsule (30 mg total) by mouth 2 (two) times daily. 180 capsule 1   levothyroxine  (SYNTHROID ) 88 MCG tablet Take 1 tablet (88 mcg total) by mouth daily before breakfast. 90 tablet 1   lidocaine  (LIDODERM ) 5 % Place 1 patch onto the skin daily. Remove & Discard patch within 12 hours or as directed by MD 14 patch 0   misoprostol  (CYTOTEC ) 100  MCG tablet TAKE 2 TABLET BY MOUTH AFTER BREAKFAST AND AFTER DINNER AS DIRECTED. KEEP APPOINTMENT FOR FURTHER FILLS. 120 tablet 5   mupirocin  ointment (BACTROBAN ) 2 % Apply 1 Application topically 2 (two) times daily. Use as needed for nose sores 22 g 0   Probiotic Product (RISAQUAD PO) Take 1 capsule by mouth daily.     simvastatin  (ZOCOR ) 20 MG tablet Take 1 tablet (20 mg total) by mouth daily. 90 tablet 1   Vitamin D-Vitamin K (  VITAMIN K2-VITAMIN D3 PO) Take 1 tablet by mouth daily.     No current facility-administered medications on file prior to visit.    Review of Systems:  As per HPI- otherwise negative.   Physical Examination: There were no vitals filed for this visit. There were no vitals filed for this visit. There is no height or weight on file to calculate BMI. Ideal Body Weight:    GEN: no acute distress. HEENT: Atraumatic, Normocephalic.  Ears and Nose: No external deformity. CV: RRR, No M/G/R. No JVD. No thrill. No extra heart sounds. PULM: CTA B, no wheezes, crackles, rhonchi. No retractions. No resp. distress. No accessory muscle use. ABD: S, NT, ND, +BS. No rebound. No HSM. EXTR: No c/c/e PSYCH: Normally interactive. Conversant.    Assessment and Plan: ***  Signed Harlene Schroeder, MD

## 2024-03-21 ENCOUNTER — Telehealth: Payer: Self-pay

## 2024-03-21 NOTE — Telephone Encounter (Signed)
 Initial Comment Caller states she had a fall Wednesday and went to ER for pain with back. The medication is not helping with pain in back. Translation No Nurse Assessment Nurse: Bryant, RN, Delon Date/Time Titus Time): 03/19/2024 12:22:39 AM Confirm and document reason for call. If symptomatic, describe symptoms. ---Caller states that patient with back pain after a fall on Wednesday. She takes tylenol , gabapentin  and tramadol  TID and was not prescribed anything else for pain. Patient says the pain comes with movement across the right side of the back. Caller wants to know if she can get pain medication or if she can take more or what she can do. Causing the patient trouble sleeping. Does the patient have any new or worsening symptoms? ---Yes Will a triage be completed? ---Yes Related visit to physician within the last 2 weeks? ---Yes Does the PT have any chronic conditions? (i.e. diabetes, asthma, this includes High risk factors for pregnancy, etc.) ---Yes List chronic conditions. ---hypothyroidism, chronic back pain Is this a behavioral health or substance abuse call? ---No Guidelines Guideline Title Affirmed Question Affirmed Notes Nurse Date/Time (Eastern Time) Back Injury [1] SEVERE pain (e.g., excruciating) AND [2] not improved 2 hours Bryant OBIE Delon 03/19/2024 12:28:48 AM PLEASE NOTE: All timestamps contained within this report are represented as [place] Standard Time. CONFIDENTIALTY NOTICE: This fax transmission is intended only for the addressee. It contains information that is legally privileged, confidential or otherwise protected from use or disclosure. If you are not the intended recipient, you are strictly prohibited from reviewing, disclosing, copying using or disseminating any of this information or taking any action in reliance on or regarding this information. If you have received this fax in error, please notify us  immediately by telephone so that we can  arrange for its return to us . Phone: [phone], Toll-Free: [phone], Fax: [phone] [facility] [birth year] Page: 1 of2 CallId: [id number] Guidelines Guideline Title Affirmed Question Affirmed Notes Nurse Date/Time Titus Time) after pain medicine/ ice packs Disp. Time Titus Time) Disposition Final User 03/19/2024 12:34:48 AM See HCP within 4 Hours (or PCP triage) Yes Bryant, RN, Delon Final Disposition 03/19/2024 12:34:48 AM See HCP within 4 Hours (or PCP triage) Yes Bryant, RN, Delon [place] Disagree/Comply Disagree Caller Understands Yes PreDisposition Call Doctor Care Advice Given Per Guideline SEE HCP (OR PCP TRIAGE) WITHIN 4 HOURS: * ED: Patients who may need surgery or hospital admission need to be sent to an ED. So do most patients with serious symptoms or complex medical problems. * UCC: Some UCCs can manage patients who are stable and have less serious symptoms (e.g., minor illnesses and injuries). The triager must know the [facility] capabilities before sending a patient there. If unsure, call ahead. CALL BACK IF: * You become worse CARE ADVICE given per Back Injury (Adult) guideline. PAIN MEDICINES: Comments User: Delon Bryant, RN Date/Time Titus Time): 03/19/2024 12:34:38 AM Caller advised to go to ED, she likely won't tonight so RN advised VIrtual care to address pain needs and if worsens encouraged to go to ED. Caller verb understanding. Referrals [facility]

## 2024-03-21 NOTE — Telephone Encounter (Signed)
 Pt seen at ED

## 2024-03-23 ENCOUNTER — Ambulatory Visit (HOSPITAL_BASED_OUTPATIENT_CLINIC_OR_DEPARTMENT_OTHER)
Admission: RE | Admit: 2024-03-23 | Discharge: 2024-03-23 | Disposition: A | Discharge status: Home / Self Care | Source: Ambulatory Visit | Attending: Family Medicine | Admitting: Family Medicine

## 2024-03-23 ENCOUNTER — Ambulatory Visit (INDEPENDENT_AMBULATORY_CARE_PROVIDER_SITE_OTHER): Admitting: Family Medicine

## 2024-03-23 VITALS — BP 126/82 | HR 70 | Temp 98.0°F | Ht 63.0 in | Wt 154.0 lb

## 2024-03-23 DIAGNOSIS — R9389 Abnormal findings on diagnostic imaging of other specified body structures: Secondary | ICD-10-CM | POA: Diagnosis not present

## 2024-03-23 DIAGNOSIS — S61411D Laceration without foreign body of right hand, subsequent encounter: Secondary | ICD-10-CM

## 2024-03-23 DIAGNOSIS — G4710 Hypersomnia, unspecified: Secondary | ICD-10-CM | POA: Diagnosis not present

## 2024-03-23 DIAGNOSIS — R0781 Pleurodynia: Secondary | ICD-10-CM

## 2024-03-23 DIAGNOSIS — S2231XA Fracture of one rib, right side, initial encounter for closed fracture: Secondary | ICD-10-CM | POA: Diagnosis not present

## 2024-03-23 DIAGNOSIS — J9811 Atelectasis: Secondary | ICD-10-CM | POA: Diagnosis not present

## 2024-03-24 ENCOUNTER — Encounter: Payer: Self-pay | Admitting: Family Medicine

## 2024-03-24 ENCOUNTER — Other Ambulatory Visit: Payer: Self-pay | Admitting: Gastroenterology

## 2024-03-28 ENCOUNTER — Encounter: Payer: Medicare Other | Attending: Psychology | Admitting: Psychology

## 2024-03-28 DIAGNOSIS — F411 Generalized anxiety disorder: Secondary | ICD-10-CM | POA: Diagnosis not present

## 2024-03-28 DIAGNOSIS — F331 Major depressive disorder, recurrent, moderate: Secondary | ICD-10-CM | POA: Insufficient documentation

## 2024-03-28 DIAGNOSIS — G894 Chronic pain syndrome: Secondary | ICD-10-CM | POA: Diagnosis not present

## 2024-03-28 DIAGNOSIS — G4733 Obstructive sleep apnea (adult) (pediatric): Secondary | ICD-10-CM | POA: Insufficient documentation
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
## 2024-03-29 ENCOUNTER — Encounter: Payer: Self-pay | Admitting: Psychology

## 2024-03-29 DIAGNOSIS — R2681 Unsteadiness on feet: Secondary | ICD-10-CM | POA: Diagnosis not present

## 2024-03-29 DIAGNOSIS — R2689 Other abnormalities of gait and mobility: Secondary | ICD-10-CM | POA: Diagnosis not present

## 2024-03-29 DIAGNOSIS — R29898 Other symptoms and signs involving the musculoskeletal system: Secondary | ICD-10-CM | POA: Diagnosis not present

## 2024-03-29 DIAGNOSIS — G8929 Other chronic pain: Secondary | ICD-10-CM | POA: Diagnosis not present

## 2024-03-29 DIAGNOSIS — M48062 Spinal stenosis, lumbar region with neurogenic claudication: Secondary | ICD-10-CM | POA: Diagnosis not present

## 2024-03-29 DIAGNOSIS — M545 Low back pain, unspecified: Secondary | ICD-10-CM | POA: Diagnosis not present

## 2024-04-01 DIAGNOSIS — M545 Low back pain, unspecified: Secondary | ICD-10-CM | POA: Diagnosis not present

## 2024-04-01 DIAGNOSIS — M48062 Spinal stenosis, lumbar region with neurogenic claudication: Secondary | ICD-10-CM | POA: Diagnosis not present

## 2024-04-01 DIAGNOSIS — R29898 Other symptoms and signs involving the musculoskeletal system: Secondary | ICD-10-CM | POA: Diagnosis not present

## 2024-04-01 DIAGNOSIS — R2689 Other abnormalities of gait and mobility: Secondary | ICD-10-CM | POA: Diagnosis not present

## 2024-04-01 DIAGNOSIS — R2681 Unsteadiness on feet: Secondary | ICD-10-CM | POA: Diagnosis not present

## 2024-04-01 DIAGNOSIS — G8929 Other chronic pain: Secondary | ICD-10-CM | POA: Diagnosis not present

## 2024-04-04 DIAGNOSIS — M47816 Spondylosis without myelopathy or radiculopathy, lumbar region: Secondary | ICD-10-CM | POA: Diagnosis not present

## 2024-04-04 DIAGNOSIS — M48062 Spinal stenosis, lumbar region with neurogenic claudication: Secondary | ICD-10-CM | POA: Diagnosis not present

## 2024-04-05 DIAGNOSIS — R29898 Other symptoms and signs involving the musculoskeletal system: Secondary | ICD-10-CM | POA: Diagnosis not present

## 2024-04-05 DIAGNOSIS — R2689 Other abnormalities of gait and mobility: Secondary | ICD-10-CM | POA: Diagnosis not present

## 2024-04-05 DIAGNOSIS — M48062 Spinal stenosis, lumbar region with neurogenic claudication: Secondary | ICD-10-CM | POA: Diagnosis not present

## 2024-04-05 DIAGNOSIS — R2681 Unsteadiness on feet: Secondary | ICD-10-CM | POA: Diagnosis not present

## 2024-04-05 DIAGNOSIS — M545 Low back pain, unspecified: Secondary | ICD-10-CM | POA: Diagnosis not present

## 2024-04-05 DIAGNOSIS — G8929 Other chronic pain: Secondary | ICD-10-CM | POA: Diagnosis not present

## 2024-04-12 ENCOUNTER — Ambulatory Visit (INDEPENDENT_AMBULATORY_CARE_PROVIDER_SITE_OTHER): Admitting: Family

## 2024-04-12 ENCOUNTER — Ambulatory Visit: Payer: Self-pay

## 2024-04-12 VITALS — BP 131/50 | HR 60 | Temp 99.0°F | Resp 16 | Ht 63.0 in | Wt 154.0 lb

## 2024-04-12 DIAGNOSIS — R1032 Left lower quadrant pain: Secondary | ICD-10-CM

## 2024-04-12 DIAGNOSIS — J340 Abscess, furuncle and carbuncle of nose: Secondary | ICD-10-CM | POA: Diagnosis not present

## 2024-04-12 DIAGNOSIS — M545 Low back pain, unspecified: Secondary | ICD-10-CM | POA: Diagnosis not present

## 2024-04-12 DIAGNOSIS — G6289 Other specified polyneuropathies: Secondary | ICD-10-CM | POA: Diagnosis not present

## 2024-04-12 DIAGNOSIS — H00012 Hordeolum externum right lower eyelid: Secondary | ICD-10-CM | POA: Insufficient documentation

## 2024-04-12 DIAGNOSIS — M48062 Spinal stenosis, lumbar region with neurogenic claudication: Secondary | ICD-10-CM | POA: Diagnosis not present

## 2024-04-12 DIAGNOSIS — R197 Diarrhea, unspecified: Secondary | ICD-10-CM | POA: Insufficient documentation

## 2024-04-12 DIAGNOSIS — R29898 Other symptoms and signs involving the musculoskeletal system: Secondary | ICD-10-CM | POA: Diagnosis not present

## 2024-04-12 DIAGNOSIS — R2689 Other abnormalities of gait and mobility: Secondary | ICD-10-CM | POA: Diagnosis not present

## 2024-04-12 DIAGNOSIS — G8929 Other chronic pain: Secondary | ICD-10-CM | POA: Diagnosis not present

## 2024-04-12 DIAGNOSIS — R2681 Unsteadiness on feet: Secondary | ICD-10-CM | POA: Diagnosis not present

## 2024-04-12 MED ORDER — ERYTHROMYCIN 5 MG/GM OP OINT: 1.0000 | TOPICAL_OINTMENT | Freq: Two times a day (BID) | OPHTHALMIC | 0 refills | Status: DC

## 2024-04-12 NOTE — Progress Notes (Signed)
 Subjective:     Patient ID: Mary Buck, female    DOB: 1936-06-17, 88 y.o.   MRN: [id number]  Chief Complaint  Patient presents with   Groin Pain    Patient complains of inguinal pain on the left   Ankle Pain    Patient complains of ankle pain  right and left on and off.  Not at the same time   Diarrhea    Complains of having diarrhea yesterday    Groin Pain Associated symptoms include diarrhea.  Ankle Pain   Diarrhea     Discussed the use of AI scribe software for clinical note transcription with the patient, who gave verbal consent to proceed.  History of Present Illness  Mary Buck is an 88 year old female with neuropathy and GERD who presents with a sore lymph node in the left groin and ankle pain.  She has a sore lymph node in the left groin that began yesterday. Changing to softer underwear did not alleviate the soreness. She has experienced similar issues in the past, but she did not persist.  She experiences random sharp ankle pain that occurs suddenly and can affect either ankle. She is currently taking gabapentin  and tramadol  for neuropathy and uses Tylenol  as needed.  She experienced diarrhea yesterday, which was sudden in onset and resolved within a day. She also reports brief stomach pains. She has GERD and irregular eating patterns.  She noticed a new bruise on her breast today, about the size of a dime. She attributes that to rolling off of the couch onto the floor 2 days ago by accident.  Her eye is red, stings, and hurts. She has been treating it with hot compresses and drops for about a week, suspecting it might be a stye.   Health Maintenance Due  Topic Date Due   Medicare Annual Wellness (AWV)  11/13/2023    Past Medical History:  Diagnosis Date   Allergy    Asthma    as teen   Cataract    DDD (degenerative disc disease), lumbar 06/01/2017   Depression    GERD (gastroesophageal reflux disease)    Heart murmur    no issues    Hyperlipidemia    Hypothyroidism    IDA (iron deficiency anemia) 05/31/2020   Peripheral neuropathy 04/30/2017   Skin cancer    left leg had radiation, bladder cancer   Sleep apnea    no CPAP   Thyroid  disease     Past Surgical History:  Procedure Laterality Date   bladder cancer  2013   CARPAL TUNNEL RELEASE  2015   CATARACT EXTRACTION Bilateral 2012   CHOLECYSTECTOMY     COLONOSCOPY WITH ESOPHAGOGASTRODUODENOSCOPY (EGD)  02/22/2018   [facility]   SALIVARY GLAND SURGERY Left 2015   benign   SPINE SURGERY  6/31/21   vertaflex   TONSILLECTOMY AND ADENOIDECTOMY  1943   TOTAL HIP ARTHROPLASTY Left 01/20/2023   Procedure: TOTAL HIP ARTHROPLASTY;  Surgeon: Josefina Chew, MD;  Location: [facility];  Service: Orthopedics;  Laterality: Left;    Family History  Problem Relation Age of Onset   Macular degeneration Mother    Hyperlipidemia Father    Stroke Father    Cancer Brother        ? stomach   Prostate cancer Other        in brothers   Colon cancer Neg Hx     Social History   Socioeconomic History  Marital status: Significant Other    Spouse name: Not on file   Number of children: 0   Years of education: 16   Highest education level: Bachelor's degree (e.g., BA, AB, BS)  Occupational History   Occupation: retired  Tobacco Use   Smoking status: Former    Current packs/day: 0.00    Types: Cigarettes    Quit date: 2015    Years since quitting: 10.5   Smokeless tobacco: Never  Vaping Use   Vaping status: Never Used  Substance and Sexual Activity   Alcohol  use: No   Drug use: No   Sexual activity: Not Currently  Other Topics Concern   Not on file  Social History Narrative   Right handed   Married, 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   Caffeine-4-5 cups daily   Social Drivers of Health   Financial Resource Strain: Patient Declined (01/15/2024)   Overall Financial Resource Strain (CARDIA)    Difficulty of Paying Living Expenses:  Patient declined  Food Insecurity: Patient Declined (01/15/2024)   Hunger Vital Sign    Worried About Running Out of Food in the Last Year: Patient declined    Ran Out of Food in the Last Year: Patient declined  Transportation Needs: No Transportation Needs (01/15/2024)   PRAPARE - Administrator, Civil Service (Medical): No    Lack of Transportation (Non-Medical): No  Physical Activity: Sufficiently Active (01/15/2024)   Exercise Vital Sign    Days of Exercise per Week: 5 days    Minutes of Exercise per Session: 30 min  Stress: Patient Declined (01/15/2024)   [employer] of Occupational Health - Occupational Stress Questionnaire    Feeling of Stress : Patient declined  Social Connections: Unknown (01/15/2024)   Social Connection and Isolation Panel    Frequency of Communication with Friends and Family: Three times a week    Frequency of Social Gatherings with Friends and Family: Patient declined    Attends Religious Services: More than 4 times per year    Active Member of Clubs or Organizations: Patient declined    Attends Banker Meetings: Not on file    Marital Status: Living with partner  Intimate Partner Violence: Not At Risk (01/20/2023)   Humiliation, Afraid, Rape, and Kick questionnaire    Fear of Current or Ex-Partner: No    Emotionally Abused: No    Physically Abused: No    Sexually Abused: No    Outpatient Medications Prior to Visit  Medication Sig Dispense Refill   Carboxymethylcellulose Sod PF (LUBRICANT EYE DROPS PF) 0.5 % SOLN Place 1 drop into both eyes 4 (four) times daily as needed (dry eyes).     famotidine  (PEPCID ) 20 MG tablet TAKE 1 TABLET(20 MG) BY MOUTH DAILY 90 tablet 1   gabapentin  (NEURONTIN ) 300 MG capsule Take 1 capsule (300 mg total) by mouth 3 (three) times daily. (Patient taking differently: Take 300 mg by mouth 2 (two) times daily before lunch and supper.) 90 capsule 4   lansoprazole  (PREVACID ) 30 MG capsule Take 1 capsule (30 mg  total) by mouth 2 (two) times daily. 180 capsule 1   levothyroxine  (SYNTHROID ) 88 MCG tablet Take 1 tablet (88 mcg total) by mouth daily before breakfast. 90 tablet 1   lidocaine  (LIDODERM ) 5 % Place 1 patch onto the skin daily. Remove & Discard patch within 12 hours or as directed by MD 14 patch 0   misoprostol  (CYTOTEC ) 100 MCG tablet TAKE 2 TABLET BY MOUTH AFTER  BREAKFAST AND AFTER DINNER AS DIRECTED. KEEP APPOINTMENT FOR FURTHER FILLS. 120 tablet 5   mupirocin  ointment (BACTROBAN ) 2 % Apply 1 Application topically 2 (two) times daily. Use as needed for nose sores 22 g 0   Probiotic Product (RISAQUAD PO) Take 1 capsule by mouth daily.     simvastatin  (ZOCOR ) 20 MG tablet Take 1 tablet (20 mg total) by mouth daily. 90 tablet 1   traMADol  (ULTRAM ) 50 MG tablet Take 50 mg by mouth.     Vitamin D-Vitamin K (VITAMIN K2-VITAMIN D3 PO) Take 1 tablet by mouth daily.     doxycycline  (VIBRAMYCIN ) 100 MG capsule Take 1 capsule (100 mg total) by mouth 2 (two) times daily. 20 capsule 0   No facility-administered medications prior to visit.    Allergies  Allergen Reactions   Contrast Media [Iodinated Contrast Media] Anaphylaxis   Other Anaphylaxis    Sulfa    X-ray diagnostic materials   Oxycodone Anaphylaxis    my throat swells with any opioid (tolerated hydrocodone  01/21/23)   Latex Other (See Comments)    Other reaction(s): blistering, redness    Mirtazapine Nausea Only   Sulfur Other (See Comments)   Acitretin Rash   Cephalexin Other (See Comments)    Unknown (tolerated Ancef  01/21/23)   Elemental Sulfur Other (See Comments)    unknown   Penicillins Other (See Comments)    Unknown   Sulfa Antibiotics Other (See Comments)    Unknown    Review of Systems  Gastrointestinal:  Positive for diarrhea.       Objective:    Physical Exam Constitutional:      General: She is not in acute distress.    Appearance: Normal appearance. She is well-developed.  HENT:     Head: Normocephalic  and atraumatic.     Right Ear: External ear normal.     Left Ear: External ear normal.     Nose:     Comments: No obvious lesion noted in right nare Eyes:     General: No scleral icterus.    Comments: + stye located left lower eyelid middle  Neck:     Thyroid : No thyromegaly.  Cardiovascular:     Rate and Rhythm: Normal rate and regular rhythm.     Heart sounds: Normal heart sounds. No murmur heard. Pulmonary:     Effort: Pulmonary effort is normal. No respiratory distress.     Breath sounds: Normal breath sounds. No wheezing.  Genitourinary:    Comments: Mild inguinal tenderness on the left. No discrete palpable lymph node or hernia Musculoskeletal:     Cervical back: Neck supple.  Skin:    General: Skin is warm and dry.  Neurological:     Mental Status: She is alert and oriented to person, place, and time.  Psychiatric:        Mood and Affect: Mood normal.        Behavior: Behavior normal.        Thought Content: Thought content normal.        Judgment: Judgment normal.      BP (!) 131/50 (BP Location: Right Arm, Patient Position: Sitting, Cuff Size: Small)   Pulse 60   Temp 99 F (37.2 C) (Oral)   Resp 16   Ht 5' 3 (1.6 m)   Wt 154 lb (69.9 kg)   SpO2 97%   BMI 27.28 kg/m  Wt Readings from Last 3 Encounters:  04/12/24 154 lb (69.9 kg)  03/23/24 154 lb (69.9  kg)  03/16/24 149 lb (67.6 kg)       Assessment & Plan:   Problem List Items Addressed This Visit       Unprioritized   Ulcer of nose   States this is chronic- I am unable to see on exam as I think it is up higher than I can visualize.  Offered referral to ENT, she declines.       Peripheral neuropathy   I think that the sharp pain she is getting in her ankles is likely related to peripheral neuropathy and possibly her hx of lumbar stenosis.  Already on gabapentin  300mg  tid and tramadol . Advised OK to take tylenol  prn.       Left groin pain   New. No obvious hernia or lymphadenopathy. Advised pt  to let us  know if no improvement in 1 week or if pain worsens and we can consider imaging.        Hordeolum externum of right lower eyelid - Primary   New. Continue warm compresses.  Add erythromycin  ointment bid. Call if no improvement in 1 week.      Relevant Medications   erythromycin  ophthalmic ointment   Diarrhea   Occurred yesterday. No diarrhea today. Advised pt to let us  know if recurrence of diarrhea.       I have discontinued Alexah C. Fleet's doxycycline . I am also having her start on erythromycin . Additionally, I am having her maintain her gabapentin , Vitamin D-Vitamin K (VITAMIN K2-VITAMIN D3 PO), Probiotic Product (RISAQUAD PO), Carboxymethylcellulose Sod PF, lansoprazole , mupirocin  ointment, levothyroxine , simvastatin , misoprostol , lidocaine , traMADol , and famotidine .  Meds ordered this encounter  Medications   erythromycin  ophthalmic ointment    Sig: Place 1 Application into the right eye in the morning and at bedtime.    Dispense:  3.5 g    Refill:  0    Supervising Provider:   DOMENICA BLACKBIRD A [[id number]]

## 2024-04-12 NOTE — Telephone Encounter (Signed)
 Pt acted frustrated just by telling NT her name and DOB. Pt wanted appt no triage. Appt today. Pt unhappy not with PCP. I just want like 7 minutes of her time. Pt asked again if can just come by to see PCP without appt. Advised pt can see provider with appt only.      Reason for Disposition  Requesting regular office appointment  Answer Assessment - Initial Assessment Questions 1. REASON FOR CALL: What is the main reason for your call? or How can I best help you?     Wanting appt no triage 2. SYMPTOMS : Do you have any symptoms?       3. OTHER QUESTIONS: Do you have any other questions?     Just wants appt  Protocols used: Information Only Call - No Triage-A-AH

## 2024-04-12 NOTE — Patient Instructions (Signed)
 VISIT SUMMARY:  During your visit, we discussed several issues including a sore lymph node in your left groin, ankle pain, a new bruise on your breast, and a red, stinging eye. We have provided treatment plans and recommendations for each of these concerns.  YOUR PLAN:  LEFT GROIN LYMPHADENOPATHY: You have a sore lymph node in your left groin, likely due to minor skin irritation or a viral infection. -Apply heat to the area to relieve pain. -Monitor your symptoms for one week. -If the soreness persists, we will order an ultrasound.  STYE, RIGHT LOWER EYELID: You have a stye on your right lower eyelid causing redness, stinging, and pain. -Continue using warm compresses. -I am prescribing erythromycin  ointment for you to use. -If the symptoms persist, we will reassess in one to two weeks.  BREAST CONTUSION: You have a new bruise on your breast, likely from a fall. -Monitor the bruise for any changes or worsening symptoms.  PERIPHERAL NEUROPATHY WITH CHRONIC BACK PAIN AND SPINAL STENOSIS: You have chronic back pain and spinal stenosis causing sharp ankle pain, managed with gabapentin  and tramadol . -Continue taking gabapentin  and tramadol  as prescribed. -You can take Tylenol  for additional pain relief on bad days.

## 2024-04-12 NOTE — Assessment & Plan Note (Signed)
 I think that the sharp pain she is getting in her ankles is likely related to peripheral neuropathy and possibly her hx of lumbar stenosis.  Already on gabapentin  300mg  tid and tramadol . Advised OK to take tylenol  prn.

## 2024-04-12 NOTE — Assessment & Plan Note (Signed)
 Occurred yesterday. No diarrhea today. Advised pt to let us  know if recurrence of diarrhea.

## 2024-04-12 NOTE — Assessment & Plan Note (Signed)
 New. Continue warm compresses.  Add erythromycin  ointment bid. Call if no improvement in 1 week.

## 2024-04-12 NOTE — Assessment & Plan Note (Signed)
 States this is chronic- I am unable to see on exam as I think it is up higher than I can visualize.  Offered referral to ENT, she declines.

## 2024-04-12 NOTE — Telephone Encounter (Signed)
 Copied from CRM [id number]. Topic: Clinical - Red Word Triage >> Apr 12, 2024  9:54 AM Rosina BIRCH wrote: Red Word that prompted transfer to Nurse Triage: patient called stating she is having diarrhea and stomach pain for awhile. Patient also stated she has a possible eye infection that has been going on for two weeks This encounter was created in error - please disregard.

## 2024-04-12 NOTE — Assessment & Plan Note (Signed)
 New. No obvious hernia or lymphadenopathy. Advised pt to let us  know if no improvement in 1 week or if pain worsens and we can consider imaging.

## 2024-04-19 DIAGNOSIS — R29898 Other symptoms and signs involving the musculoskeletal system: Secondary | ICD-10-CM | POA: Diagnosis not present

## 2024-04-19 DIAGNOSIS — M48062 Spinal stenosis, lumbar region with neurogenic claudication: Secondary | ICD-10-CM | POA: Diagnosis not present

## 2024-04-19 DIAGNOSIS — M545 Low back pain, unspecified: Secondary | ICD-10-CM | POA: Diagnosis not present

## 2024-04-19 DIAGNOSIS — R2689 Other abnormalities of gait and mobility: Secondary | ICD-10-CM | POA: Diagnosis not present

## 2024-04-19 DIAGNOSIS — G8929 Other chronic pain: Secondary | ICD-10-CM | POA: Diagnosis not present

## 2024-04-19 DIAGNOSIS — R2681 Unsteadiness on feet: Secondary | ICD-10-CM | POA: Diagnosis not present

## 2024-04-21 NOTE — Progress Notes (Deleted)
 Called to speak with Mary Buck- she has struggled with some redness and swelling of her legs in part due to extensive lower extremity skin cancer history.   She notes her legs seem to have some bubbles or blisters on them for the last couple of days The right leg is worse but it tends to swell more.  The left leg had the worst of the skin cancer surgery and has significant scarring No fever or SOB  I would like to ge there seen asap- however I cannot see her until Monday. She does have an appt with me on 8/18 but that is too far out . I was able to schedule her w/ my trusted partner Dr Frann tomorrow- appreciate his care

## 2024-04-21 NOTE — Progress Notes (Unsigned)
 [facility] [address] [place], [place] [address] [phone]  Date:  05/02/2024   Name:  Mary Buck   DOB:  Jan 28, 1936   MRN:  [id number]  PCP:  Watt Harlene JAYSON, MD    Chief Complaint: No chief complaint on file.   History of Present Illness:  Mary Buck is a 88 y.o. very pleasant female patient who presents with the following:  Patient seen today for follow-up of leg pain and swelling She has history of osteopenia, degenerative disc disease in her back and spinal stenosis, bladder cancer, fairly extensive lower extremity skin cancer, hypothyroidism, hyperlipidemia, asthma and allergies    - She saw my partner Dr. Frann for this issue 10 days ago He obtained an ultrasound to rule out blood clot and otherwise recommended supportive care  Otherwise, I saw Comer most recently in July for periodic follow-up.  At that time her most pressing concern was feeling tired a lot.  She does have history of sleep apnea but was not using CPAP due to difficulty using the machine.  I reached out to her neurologist at that time asking them to bring her in for an appointment She also incidentally had fallen recently and had a rib fracture Patient Active Problem List   Diagnosis Date Noted   Diarrhea 04/12/2024   Hordeolum externum of right lower eyelid 04/12/2024   S/P hip replacement, left 01/20/2023   Thyroid  disease    Heart murmur    GERD (gastroesophageal reflux disease)    Depression    Cataract    Cancer (HCC)    Asthma    Allergy    IDA (iron deficiency anemia) 05/31/2020   Osteopenia 05/30/2019   Pedal edema 01/31/2018   Squamous cell carcinoma of skin of left lower limb, including hip 12/16/2017   Plantar fasciitis 07/07/2017   DDD (degenerative disc disease), lumbar 06/01/2017   Fibromyalgia 06/01/2017   Spinal stenosis at L4-L5 level 04/30/2017   Peripheral neuropathy 04/30/2017    Encounter for therapeutic drug monitoring 03/05/2017   Bladder cancer (HCC) 08/20/2015   Arthropathy of lumbar facet joint 07/27/2015   Spinal stenosis of cervical region 07/27/2015   Spondylolisthesis 07/27/2015   Hypothyroid 07/05/2015   Abdominal pain, acute, left upper quadrant 06/28/2015   Basal cell carcinoma, face 06/28/2015   Left groin pain 06/28/2015   Chronic pain 06/28/2015   Fatigue 06/28/2015   Hyperlipidemia 06/28/2015   Bilateral hip pain 06/28/2015   Paresthesia of lower lip 06/28/2015   Pulmonary nodule 06/28/2015   Pustulosis palmaris et plantaris 06/28/2015   Ulcer of nose 06/28/2015   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   Mass of parotid gland 03/24/2014    Past Medical History:  Diagnosis Date   Allergy    Asthma    as teen   Cataract    DDD (degenerative disc disease), lumbar 06/01/2017   Depression    GERD (gastroesophageal reflux disease)    Heart murmur    no issues   Hyperlipidemia    Hypothyroidism    IDA (iron deficiency anemia) 05/31/2020   Peripheral neuropathy 04/30/2017   Skin cancer    left leg had radiation, bladder cancer   Sleep apnea    no CPAP   Thyroid  disease     Past Surgical History:  Procedure Laterality Date   bladder cancer  2013   CARPAL TUNNEL RELEASE  2015   CATARACT EXTRACTION Bilateral 2012  CHOLECYSTECTOMY     COLONOSCOPY WITH ESOPHAGOGASTRODUODENOSCOPY (EGD)  02/22/2018   [facility]   SALIVARY GLAND SURGERY Left 2015   benign   SPINE SURGERY  6/31/21   vertaflex   TONSILLECTOMY AND ADENOIDECTOMY  1943   TOTAL HIP ARTHROPLASTY Left 01/20/2023   Procedure: TOTAL HIP ARTHROPLASTY;  Surgeon: Josefina Chew, MD;  Location: [facility];  Service: Orthopedics;  Laterality: Left;    Social History   Tobacco Use   Smoking status: Former    Current packs/day: 0.00    Types: Cigarettes    Quit date: 2015    Years since quitting: 10.6   Smokeless tobacco: Never  Vaping Use   Vaping status: Never  Used  Substance Use Topics   Alcohol  use: No   Drug use: No    Family History  Problem Relation Age of Onset   Macular degeneration Mother    Hyperlipidemia Father    Stroke Father    Cancer Brother        ? stomach   Prostate cancer Other        in brothers   Colon cancer Neg Hx     Allergies  Allergen Reactions   Contrast Media [Iodinated Contrast Media] Anaphylaxis   Other Anaphylaxis    Sulfa    X-ray diagnostic materials   Oxycodone Anaphylaxis    my throat swells with any opioid (tolerated hydrocodone  01/21/23)   Latex Other (See Comments)    Other reaction(s): blistering, redness    Mirtazapine Nausea Only   Sulfur Other (See Comments)   Acitretin Rash   Cephalexin Other (See Comments)    Unknown (tolerated Ancef  01/21/23)   Elemental Sulfur Other (See Comments)    unknown   Penicillins Other (See Comments)    Unknown   Sulfa Antibiotics Other (See Comments)    Unknown    Medication list has been reviewed and updated.  Current Outpatient Medications on File Prior to Visit  Medication Sig Dispense Refill   Carboxymethylcellulose Sod PF (LUBRICANT EYE DROPS PF) 0.5 % SOLN Place 1 drop into both eyes 4 (four) times daily as needed (dry eyes).     erythromycin  ophthalmic ointment Place 1 Application into the right eye in the morning and at bedtime. 3.5 g 0   famotidine  (PEPCID ) 20 MG tablet TAKE 1 TABLET(20 MG) BY MOUTH DAILY 90 tablet 1   gabapentin  (NEURONTIN ) 300 MG capsule Take 1 capsule (300 mg total) by mouth 3 (three) times daily. (Patient taking differently: Take 300 mg by mouth 2 (two) times daily before lunch and supper.) 90 capsule 4   lansoprazole  (PREVACID ) 30 MG capsule Take 1 capsule (30 mg total) by mouth 2 (two) times daily. 180 capsule 1   levothyroxine  (SYNTHROID ) 88 MCG tablet Take 1 tablet (88 mcg total) by mouth daily before breakfast. 90 tablet 1   lidocaine  (LIDODERM ) 5 % Place 1 patch onto the skin daily. Remove & Discard patch within 12  hours or as directed by MD 14 patch 0   misoprostol  (CYTOTEC ) 100 MCG tablet TAKE 2 TABLET BY MOUTH AFTER BREAKFAST AND AFTER DINNER AS DIRECTED. KEEP APPOINTMENT FOR FURTHER FILLS. 120 tablet 5   mupirocin  ointment (BACTROBAN ) 2 % Apply 1 Application topically 2 (two) times daily. Use as needed for nose sores 22 g 0   Probiotic Product (RISAQUAD PO) Take 1 capsule by mouth daily.     simvastatin  (ZOCOR ) 20 MG tablet Take 1 tablet (20 mg total) by mouth daily.  90 tablet 1   traMADol  (ULTRAM ) 50 MG tablet Take 50 mg by mouth.     Vitamin D-Vitamin K (VITAMIN K2-VITAMIN D3 PO) Take 1 tablet by mouth daily.     No current facility-administered medications on file prior to visit.    Review of Systems:  As per HPI- otherwise negative.   Physical Examination: There were no vitals filed for this visit. There were no vitals filed for this visit. There is no height or weight on file to calculate BMI. Ideal Body Weight:    GEN: no acute distress. HEENT: Atraumatic, Normocephalic.  Ears and Nose: No external deformity. CV: RRR, No M/G/R. No JVD. No thrill. No extra heart sounds. PULM: CTA B, no wheezes, crackles, rhonchi. No retractions. No resp. distress. No accessory muscle use. ABD: S, NT, ND, +BS. No rebound. No HSM. EXTR: No c/c/e PSYCH: Normally interactive. Conversant.    Assessment and Plan: ***  Signed Harlene Schroeder, MD

## 2024-04-22 ENCOUNTER — Ambulatory Visit (HOSPITAL_BASED_OUTPATIENT_CLINIC_OR_DEPARTMENT_OTHER)
Admission: RE | Admit: 2024-04-22 | Discharge: 2024-04-22 | Disposition: A | Discharge status: Home / Self Care | Source: Ambulatory Visit | Attending: Family Medicine | Admitting: Family Medicine

## 2024-04-22 ENCOUNTER — Encounter: Payer: Self-pay | Admitting: Family Medicine

## 2024-04-22 ENCOUNTER — Ambulatory Visit (INDEPENDENT_AMBULATORY_CARE_PROVIDER_SITE_OTHER): Admitting: Family Medicine

## 2024-04-22 VITALS — BP 128/78 | HR 67 | Temp 98.0°F | Resp 16 | Ht 63.0 in | Wt 152.4 lb

## 2024-04-22 DIAGNOSIS — R2241 Localized swelling, mass and lump, right lower limb: Secondary | ICD-10-CM

## 2024-04-22 DIAGNOSIS — M79604 Pain in right leg: Secondary | ICD-10-CM

## 2024-04-22 DIAGNOSIS — M7989 Other specified soft tissue disorders: Secondary | ICD-10-CM | POA: Diagnosis not present

## 2024-04-22 NOTE — Patient Instructions (Addendum)
 For the swelling in your lower extremities, be sure to elevate your legs when able, mind the salt intake, stay physically active and consider wearing compression stockings.  I think over the counter compression stockings are reasonable.   Ice/cold pack over area for 10-15 min twice daily.  OK to take Tylenol  1000 mg (2 extra strength tabs) or 975 mg (3 regular strength tabs) every 6 hours as needed.  We will be in touch with your scan results.   Let us  know if you need anything.

## 2024-04-22 NOTE — Progress Notes (Signed)
 Chief Complaint  Patient presents with   Leg Swelling    Leg Swelling    Mary Buck Search here for bilateral leg swelling.  Duration: 1 year it has been present on the left side; R side started bothering her for the past few mo Hx of prolonged bedrest, recent surgery, travel? No She has been falling starting around 3 mo ago. She has a hx of gait instability and is working with PT.  Pain the calf? No Has been having blisters on her leg for a couple weeks.  Hx of radiation for skin cancer on LLE.  SOB? No Personal or family history of clot or bleeding disorder? No Hx of heart failure, renal failure, hepatic failure?   Past Medical History:  Diagnosis Date   Allergy    Asthma    as teen   Cataract    DDD (degenerative disc disease), lumbar 06/01/2017   Depression    GERD (gastroesophageal reflux disease)    Heart murmur    no issues   Hyperlipidemia    Hypothyroidism    IDA (iron deficiency anemia) 05/31/2020   Peripheral neuropathy 04/30/2017   Skin cancer    left leg had radiation, bladder cancer   Sleep apnea    no CPAP   Thyroid  disease    Family History  Problem Relation Age of Onset   Macular degeneration Mother    Hyperlipidemia Father    Stroke Father    Cancer Brother        ? stomach   Prostate cancer Other        in brothers   Colon cancer Neg Hx    Past Surgical History:  Procedure Laterality Date   bladder cancer  2013   CARPAL TUNNEL RELEASE  2015   CATARACT EXTRACTION Bilateral 2012   CHOLECYSTECTOMY     COLONOSCOPY WITH ESOPHAGOGASTRODUODENOSCOPY (EGD)  02/22/2018   [facility]   SALIVARY GLAND SURGERY Left 2015   benign   SPINE SURGERY  6/31/21   vertaflex   TONSILLECTOMY AND ADENOIDECTOMY  1943   TOTAL HIP ARTHROPLASTY Left 01/20/2023   Procedure: TOTAL HIP ARTHROPLASTY;  Surgeon: Josefina Chew, MD;  Location: [facility];  Service: Orthopedics;  Laterality: Left;    Current Outpatient Medications:     Carboxymethylcellulose Sod PF (LUBRICANT EYE DROPS PF) 0.5 % SOLN, Place 1 drop into both eyes 4 (four) times daily as needed (dry eyes)., Disp: , Rfl:    erythromycin  ophthalmic ointment, Place 1 Application into the right eye in the morning and at bedtime., Disp: 3.5 g, Rfl: 0   famotidine  (PEPCID ) 20 MG tablet, TAKE 1 TABLET(20 MG) BY MOUTH DAILY, Disp: 90 tablet, Rfl: 1   gabapentin  (NEURONTIN ) 300 MG capsule, Take 1 capsule (300 mg total) by mouth 3 (three) times daily. (Patient taking differently: Take 300 mg by mouth 2 (two) times daily before lunch and supper.), Disp: 90 capsule, Rfl: 4   lansoprazole  (PREVACID ) 30 MG capsule, Take 1 capsule (30 mg total) by mouth 2 (two) times daily., Disp: 180 capsule, Rfl: 1   levothyroxine  (SYNTHROID ) 88 MCG tablet, Take 1 tablet (88 mcg total) by mouth daily before breakfast., Disp: 90 tablet, Rfl: 1   lidocaine  (LIDODERM ) 5 %, Place 1 patch onto the skin daily. Remove & Discard patch within 12 hours or as directed by MD, Disp: 14 patch, Rfl: 0   misoprostol  (CYTOTEC ) 100 MCG tablet, TAKE 2 TABLET BY MOUTH AFTER BREAKFAST AND AFTER DINNER AS DIRECTED.  KEEP APPOINTMENT FOR FURTHER FILLS., Disp: 120 tablet, Rfl: 5   mupirocin  ointment (BACTROBAN ) 2 %, Apply 1 Application topically 2 (two) times daily. Use as needed for nose sores, Disp: 22 g, Rfl: 0   Probiotic Product (RISAQUAD PO), Take 1 capsule by mouth daily., Disp: , Rfl:    simvastatin  (ZOCOR ) 20 MG tablet, Take 1 tablet (20 mg total) by mouth daily., Disp: 90 tablet, Rfl: 1   traMADol  (ULTRAM ) 50 MG tablet, Take 50 mg by mouth., Disp: , Rfl:    Vitamin D-Vitamin K (VITAMIN K2-VITAMIN D3 PO), Take 1 tablet by mouth daily., Disp: , Rfl:   BP 128/78 (BP Location: Left Arm, Patient Position: Sitting)   Pulse 67   Temp 98 F (36.7 C) (Oral)   Resp 16   Ht 5' 3 (1.6 m)   Wt 152 lb 6.4 oz (69.1 kg)   SpO2 97%   BMI 27.00 kg/m  Gen- awake, alert, appears stated age Heart- RRR, no murmurs, 2+  pitting lower extremity edema above where her socks were on the left, 3+ pitting lower extremity edema in the right above where her sock was Lungs- CTAB, normal effort w/o accessory muscle use MSK-there is diffuse pain on the right lower extremity with a negative Homans bilaterally Skin-see below; no excessive warmth or erythema, no blistering/vesicles noted on the leg; over the lateral feet bilaterally there is some scaling and serous excoriation without current vesicles Psych: Age appropriate judgment and insight    Localized swelling of right lower extremity - Plan: US  Venous Img Lower Unilateral Right  Right leg pain - Plan: US  Venous Img Lower Unilateral Right  Swelling worsening on the right compared to the left with equivocal pain and relatively sedentary female.  This is scheduled for later today.  Will check a lower extremity duplex to rule out a clot.  Elevation, over-the-counter compression stockings to help with ease of getting on and discomfort, mind salt intake, and recumbent cycling recommended.  She is a fall risk.  Would consider a diuretic but very cautiously as sending her to the bathroom more frequently could increase risk of falls.  I also explained this to her.  I do not see any active weeping lesions or vesicles.  This is sometimes seen in swelling and if the underlying swelling is treated, so or these types of lesions.  Dry skin and dried serous excoriation noted on the feet.  No signs of infection or weeping lesions. F/u prn. Pt voiced understanding and agreement to the plan.  I spent 33 minutes with the patient discussing the above plans in addition to reviewing her chart and the same of the visit.  [name], DO 04/22/24  12:21 PM

## 2024-05-02 ENCOUNTER — Telehealth: Payer: Self-pay

## 2024-05-02 ENCOUNTER — Ambulatory Visit (INDEPENDENT_AMBULATORY_CARE_PROVIDER_SITE_OTHER): Admitting: Family Medicine

## 2024-05-02 ENCOUNTER — Encounter: Payer: Self-pay | Admitting: Family Medicine

## 2024-05-02 VITALS — BP 126/78 | HR 62 | Ht 63.0 in | Wt 154.8 lb

## 2024-05-02 DIAGNOSIS — I499 Cardiac arrhythmia, unspecified: Secondary | ICD-10-CM | POA: Diagnosis not present

## 2024-05-02 DIAGNOSIS — H109 Unspecified conjunctivitis: Secondary | ICD-10-CM | POA: Diagnosis not present

## 2024-05-02 DIAGNOSIS — E0849 Diabetes mellitus due to underlying condition with other diabetic neurological complication: Secondary | ICD-10-CM | POA: Insufficient documentation

## 2024-05-02 NOTE — Patient Instructions (Addendum)
 I do think you are ok to get back in the pool!  Use some good moisturizer on your feet but just be sure your feet are not slipper!  I might apply moisturizer before you put your socks on in the morning or use non- skid socks   Please see me in 4-6 months for a recheck visit

## 2024-05-02 NOTE — Telephone Encounter (Signed)
 Tried called pt regarding US  lvm asking her about it And ask for her call us  back let is if she had it done.

## 2024-05-04 ENCOUNTER — Ambulatory Visit: Payer: Self-pay | Admitting: Family Medicine

## 2024-05-05 ENCOUNTER — Encounter: Payer: Medicare Other | Attending: Psychology | Admitting: Psychology

## 2024-05-05 DIAGNOSIS — G894 Chronic pain syndrome: Secondary | ICD-10-CM | POA: Insufficient documentation

## 2024-05-05 DIAGNOSIS — G4733 Obstructive sleep apnea (adult) (pediatric): Secondary | ICD-10-CM | POA: Insufficient documentation

## 2024-05-05 DIAGNOSIS — F411 Generalized anxiety disorder: Secondary | ICD-10-CM | POA: Insufficient documentation

## 2024-05-05 DIAGNOSIS — F331 Major depressive disorder, recurrent, moderate: Secondary | ICD-10-CM | POA: Diagnosis not present

## 2024-05-06 ENCOUNTER — Telehealth: Payer: Self-pay

## 2024-05-06 NOTE — Telephone Encounter (Signed)
 Patient called requesting a return call. RN returned call to obtain more details but unable to reach patient. Left VM requesting patient to return call to office to discuss further.

## 2024-05-10 ENCOUNTER — Other Ambulatory Visit: Payer: Self-pay | Admitting: Family

## 2024-05-10 ENCOUNTER — Encounter: Payer: Self-pay | Admitting: Psychology

## 2024-05-10 DIAGNOSIS — D508 Other iron deficiency anemias: Secondary | ICD-10-CM

## 2024-05-10 NOTE — Progress Notes (Signed)
 Neuropsychology Visit  Patient:  Mary Buck   DOB: 1936/02/16  MR Number: [id number]  Location: [facility] [place] [facility] [address] [place], [address] [place] [address] Dept: [phone]  Date of Service: 05/05/2024  Start: 1 PM End: 2 PM  Today's visit was an in person visit was conducted in my outpatient clinic office.  The patient myself were present.  Duration of Service: 1 Hour  Provider/Observer:     Norleen JONELLE Asa PsyD  Chief Complaint:      Chief Complaint  Patient presents with   Anxiety   Depression   Gait Problem   Fall   Pain    Reason For Service:     Mary Buck is an 88 year old female who has a history of anxiety and depressive symptomatology going back to adolescence.  Patient has had issues with insomnia since age 29 that were started around the time when she had an encounter with a snake and stayed up all night worrying that she had been bitten with a venomous bite despite absence of physical signs or symptoms.  OCD and anxiety have persisted throughout life.  Patient had a recent neuropsychological evaluation due to concerns of cognitive changes but did quite well on almost all neuropsychological measures and memory issues are likely related to anxiety and normal age-related changes.  The patient has been followed by Dr. Zusman for psychotherapeutic interventions and after he left the practice she is following up with myself.  The patient presents for a follow-up session to discuss ongoing psychosocial stressors and difficulties with mood and perspective. They report feeling stuck in the past and struggling to find meaning or feel comfortable in their present situation. A recent stressor involved a house guest who overstayed their welcome and behaved inconsiderately, which has been a source of rumination.  The patient reports a persistent state of fear, particularly  related to walking and the risk of falling, which requires constant vigilance and the use of a cane outdoors. This fear is described as pervasive and disproportionate to the actual risk, impacting their daily life and sense of security.  Treatment Interventions:  Psychoeducation was provided on the nature of happiness, fear, and human behavior. Explored the concept of happiness as a relative state rather than an absolute goal, emphasizing that progress is measured by feeling slightly better than the day before. Discussed the adaptive role of fear in ensuring safety, particularly given age-related physical changes, while also addressing how disproportionate fear can become maladaptive. Introduced concepts of smart selfishness versus dumb selfishness in interpersonal relationships to promote healthier interactions. Utilized psychoeducation on the neurological basis of human behavior, including learning disabilities, cognitive biases, and emotional responses, to normalize the individual's experiences. The role of forgiveness as a self-beneficial act of letting go of retribution was also explored.  Participation Level:   Active  Participation Quality:  Appropriate      Behavioral Observation:  Well Groomed, Alert, and Appropriate.   Current Psychosocial Factors: Major stressors include managing age-related physical decline and the associated chronic fear of falling, which impacts mobility and independence. Reports feeling a lack of mission or purpose, contrasting their situation with others they perceive as having more meaningful lives. Recent interpersonal friction with a house guest has been a source of significant rumination and distress. The individual reflects on past experiences, seeking a sense of comfort they felt in younger years, indicating a challenge with adapting to current life circumstances.  Content of Session:   The session centered on a lengthy psychoeducational discussion about the  nature of happiness, fear, and human psychology. Topics included the relativity of happiness, the adaptive function of fear, the neurological underpinnings of behavior and belief systems, and the dynamics of interpersonal relationships (competition, selfishness, forgiveness). The individual's personal experiences, such as the recent issue with a house guest and reflections on feeling happiest at age 29, were used as anchor points for these broader psychological concepts. The goal was to reframe the individual's perspective on their current struggles.  Effectiveness of Interventions: The patient appeared receptive to the psychoeducational interventions, actively participating in the discussion and relating the concepts to their own life. They expressed understanding of the points made and laughed at several points, suggesting a positive engagement with the material. The session aimed to provide a cognitive framework for understanding their feelings of fear and dissatisfaction.  Target Goals:   We continue to work issues related to coping skills around her anxiety and adjustment to significant changes in physical functioning.  Patient continues with significant back pain and gait changes that limit her physical activity.  Patient has been diagnosed with obstructive sleep apnea but has not been able to tolerate a CPAP machine when she was initially in the fitting.  The patient's loss of function continues to be quite problematic.  The patient continues to do very well cognitively with good memory, reasoning and executive functioning etc.  Goals Last Reviewed:   05/05/2024  Goals Addressed Today:    The session primarily addressed the individual's pervasive sense of fear and struggle to find happiness in their current state. This was approached by providing extensive psychoeducation aimed at reframing their understanding of these emotions. The concept of happiness as a relative experience was introduced to  counter feelings of inadequacy. The adaptive nature of fear was discussed to validate its presence while exploring how it can become disproportionate.  Impression/Diagnosis:   Marchel Foote. Klute is an 88 year old female who has a history of anxiety and depressive symptomatology going back to adolescence.  Patient has had issues with insomnia since age 34 that were started around the time when she had an encounter with a snake and stayed up all night worrying that she had been bitten with a venomous bite despite absence of physical signs or symptoms.  OCD and anxiety have persisted throughout life.  Patient had a recent neuropsychological evaluation due to concerns of cognitive changes but did quite well on almost all neuropsychological measures and memory issues are likely related to anxiety and normal age-related changes.  The patient has been followed by Dr. Zusman for psychotherapeutic interventions and after he left the practice she is following up with myself.  Utilized principles of Cognitive Behavioral Therapy (CBT) to reframe thoughts about dependency and contribution to the household. - Explored the Patient's interpretation of [name]'s behavior through the lens of family history (child of an alcoholic) to foster understanding and reduce the Patient's anxiety. - Encouraged the Patient to identify small tasks they can still perform or ways to be present and supportive while Larry completes chores. - Discussed the nature of the rib fracture (non-displaced) and the healing process. - Provided education on pain as a protective mechanism. - Advised the Patient to continue with physical therapy, informing the therapist of the rib fracture, to reduce fall risk.  RISK ASSESSMENT AND MANAGEMENT: - Suicidal Ideation: No current suicidal ideation reported. Discussion of suicide was in the context of family history (cousin's son,  friend's mother). - Homicidal Ideation: None reported. - Self-harm: None  reported. - Violence & Aggression: None reported.  PLAN FOR NEXT SESSION - Continue to explore coping strategies for managing anxiety related to physical limitations and dependency. - Discuss travel plans and any associated anxieties.  Diagnosis:   Moderate episode of recurrent major depressive disorder (HCC)  OSA (obstructive sleep apnea)  Chronic pain syndrome  Generalized anxiety disorder    Norleen Asa, Psy.D. Clinical Psychologist Neuropsychologist

## 2024-05-11 ENCOUNTER — Inpatient Hospital Stay: Attending: Family

## 2024-05-11 ENCOUNTER — Other Ambulatory Visit

## 2024-05-11 ENCOUNTER — Inpatient Hospital Stay (HOSPITAL_BASED_OUTPATIENT_CLINIC_OR_DEPARTMENT_OTHER): Admitting: Family

## 2024-05-11 ENCOUNTER — Ambulatory Visit: Admitting: Family

## 2024-05-11 VITALS — BP 141/64 | HR 62 | Temp 98.6°F | Resp 17 | Wt 152.1 lb

## 2024-05-11 DIAGNOSIS — D509 Iron deficiency anemia, unspecified: Secondary | ICD-10-CM | POA: Diagnosis not present

## 2024-05-11 DIAGNOSIS — D649 Anemia, unspecified: Secondary | ICD-10-CM

## 2024-05-11 DIAGNOSIS — D508 Other iron deficiency anemias: Secondary | ICD-10-CM | POA: Diagnosis not present

## 2024-05-11 LAB — CBC WITH DIFFERENTIAL (CANCER CENTER ONLY)
Abs Immature Granulocytes: 0.01 K/uL (ref 0.00–0.07)
Basophils Absolute: 0.1 K/uL (ref 0.0–0.1)
Basophils Relative: 1 %
Eosinophils Absolute: 1 K/uL — ABNORMAL HIGH (ref 0.0–0.5)
Eosinophils Relative: 17 %
HCT: 37.4 % (ref 36.0–46.0)
Hemoglobin: 12 g/dL (ref 12.0–15.0)
Immature Granulocytes: 0 %
Lymphocytes Relative: 31 %
Lymphs Abs: 1.7 K/uL (ref 0.7–4.0)
MCH: 31.7 pg (ref 26.0–34.0)
MCHC: 32.1 g/dL (ref 30.0–36.0)
MCV: 98.9 fL (ref 80.0–100.0)
Monocytes Absolute: 0.5 K/uL (ref 0.1–1.0)
Monocytes Relative: 9 %
Neutro Abs: 2.3 K/uL (ref 1.7–7.7)
Neutrophils Relative %: 42 %
Platelet Count: 208 K/uL (ref 150–400)
RBC: 3.78 MIL/uL — ABNORMAL LOW (ref 3.87–5.11)
RDW: 12.7 % (ref 11.5–15.5)
WBC Count: 5.5 K/uL (ref 4.0–10.5)
nRBC: 0 % (ref 0.0–0.2)

## 2024-05-11 LAB — RETICULOCYTES
Immature Retic Fract: 9.5 % (ref 2.3–15.9)
RBC.: 3.74 MIL/uL — ABNORMAL LOW (ref 3.87–5.11)
Retic Count, Absolute: 34.4 K/uL (ref 19.0–186.0)
Retic Ct Pct: 0.9 % (ref 0.4–3.1)

## 2024-05-11 LAB — IRON AND IRON BINDING CAPACITY (CC-WL,HP ONLY)
Iron: 60 ug/dL (ref 28–170)
Saturation Ratios: 27 % (ref 10.4–31.8)
TIBC: 227 ug/dL — ABNORMAL LOW (ref 250–450)
UIBC: 167 ug/dL

## 2024-05-11 LAB — FERRITIN: Ferritin: 139 ng/mL (ref 11–307)

## 2024-05-11 NOTE — Progress Notes (Signed)
 Hematology and Oncology Follow Up Visit  Mary Buck [id number] 08/04/1936 88 y.o. 05/11/2024   Principle Diagnosis:  Iron deficiency anemia    Current Therapy:        IV iron as indicated   Interim History:  Mary Buck is here today for an early follow-up. She is feeling increasingly more fatigue.  She is feeling weak and has had issues with dizziness and poor balance which have caused several falls. Her last fall was over a month ago. No syncope. Mary Buck did some PT for balance and is waiting to hopefully be approved for another round though her insurance.  No blood loss, abnormal bruising or petechiae.  No fever, chills, n/v, cough, SOB, chest pain, palpitations, abdominal pain or changes in bowel or bladder habits.  She states that with past radiation to the left leg she has developed dry skin as well as blisters. She is waiting to see dermatology again for follow-up.  Appetite and hydration are good. Weight is stable at 152 lbs.   ECOG Performance Status: 1 - Symptomatic but completely ambulatory  Medications:  Allergies as of 05/11/2024       Reactions   Contrast Media [iodinated Contrast Media] Anaphylaxis   Other Anaphylaxis   Sulfa    X-ray diagnostic materials   Oxycodone Anaphylaxis   my throat swells with any opioid (tolerated hydrocodone  01/21/23)   Latex Other (See Comments)   Other reaction(s): blistering, redness    Mirtazapine Nausea Only   Sulfur Other (See Comments)   Acitretin Rash   Cephalexin Other (See Comments)   Unknown (tolerated Ancef  01/21/23)   Elemental Sulfur Other (See Comments)   unknown   Penicillins Other (See Comments)   Unknown   Sulfa Antibiotics Other (See Comments)   Unknown        Medication List        Accurate as of May 11, 2024 10:24 AM. If you have any questions, ask your nurse or doctor.          aspirin EC 81 MG tablet Take 81 mg by mouth daily. Swallow whole.   erythromycin  ophthalmic ointment Place 1  Application into the right eye in the morning and at bedtime.   famotidine  20 MG tablet Commonly known as: PEPCID  TAKE 1 TABLET(20 MG) BY MOUTH DAILY   gabapentin  100 MG capsule Commonly known as: NEURONTIN  Take 100 mg by mouth 3 (three) times daily.   lansoprazole  30 MG capsule Commonly known as: PREVACID  Take 1 capsule (30 mg total) by mouth 2 (two) times daily.   levothyroxine  88 MCG tablet Commonly known as: SYNTHROID  Take 1 tablet (88 mcg total) by mouth daily before breakfast.   lidocaine  5 % Commonly known as: Lidoderm  Place 1 patch onto the skin daily. Remove & Discard patch within 12 hours or as directed by MD   Lubricant Eye Drops PF 0.5 % Soln Generic drug: Carboxymethylcellulose Sod PF Place 1 drop into both eyes 4 (four) times daily as needed (dry eyes).   misoprostol  100 MCG tablet Commonly known as: CYTOTEC  TAKE 2 TABLET BY MOUTH AFTER BREAKFAST AND AFTER DINNER AS DIRECTED. KEEP APPOINTMENT FOR FURTHER FILLS.   mupirocin  ointment 2 % Commonly known as: BACTROBAN  Apply 1 Application topically 2 (two) times daily. Use as needed for nose sores   RISAQUAD PO Take 1 capsule by mouth daily.   simvastatin  20 MG tablet Commonly known as: ZOCOR  Take 1 tablet (20 mg total) by mouth daily.   traMADol  50 MG  tablet Commonly known as: ULTRAM  Take 50 mg by mouth.   VITAMIN K2-VITAMIN D3 PO Take 1 tablet by mouth daily.        Allergies:  Allergies  Allergen Reactions   Contrast Media [Iodinated Contrast Media] Anaphylaxis   Other Anaphylaxis    Sulfa    X-ray diagnostic materials   Oxycodone Anaphylaxis    my throat swells with any opioid (tolerated hydrocodone  01/21/23)   Latex Other (See Comments)    Other reaction(s): blistering, redness    Mirtazapine Nausea Only   Sulfur Other (See Comments)   Acitretin Rash   Cephalexin Other (See Comments)    Unknown (tolerated Ancef  01/21/23)   Elemental Sulfur Other (See Comments)    unknown   Penicillins  Other (See Comments)    Unknown   Sulfa Antibiotics Other (See Comments)    Unknown    Past Medical History, Surgical history, Social history, and Family History were reviewed and updated.  Review of Systems: All other 10 point review of systems is negative.   Physical Exam:  vitals were not taken for this visit.   Wt Readings from Last 3 Encounters:  05/02/24 154 lb 12.8 oz (70.2 kg)  04/22/24 152 lb 6.4 oz (69.1 kg)  04/12/24 154 lb (69.9 kg)    Ocular: Sclerae unicteric, pupils equal, round and reactive to light Ear-nose-throat: Oropharynx clear, dentition fair Lymphatic: No cervical or supraclavicular adenopathy Lungs no rales or rhonchi, good excursion bilaterally Heart regular rate and rhythm, no murmur appreciated Abd soft, nontender, positive bowel sounds MSK no focal spinal tenderness, no joint edema Neuro: non-focal, well-oriented, appropriate affect Breasts: Deferred   Lab Results  Component Value Date   WBC 5.5 05/11/2024   HGB 12.0 05/11/2024   HCT 37.4 05/11/2024   MCV 98.9 05/11/2024   PLT 208 05/11/2024   Lab Results  Component Value Date   FERRITIN 9 (L) 10/30/2023   IRON 92 10/30/2023   TIBC 377 10/30/2023   UIBC 285 10/30/2023   IRONPCTSAT 24 10/30/2023   Lab Results  Component Value Date   RETICCTPCT 0.9 05/11/2024   RBC 3.78 (L) 05/11/2024   RBC 3.74 (L) 05/11/2024   No results found for: KPAFRELGTCHN, LAMBDASER, KAPLAMBRATIO No results found for: IGGSERUM, IGA, IGMSERUM No results found for: STEPHANY CARLOTA BENSON MARKEL EARLA JOANNIE DOC VICK, SPEI   Chemistry      Component Value Date/Time   NA 143 01/20/2024 1210   K 3.5 01/20/2024 1210   CL 104 01/20/2024 1210   CO2 33 (H) 01/20/2024 1210   BUN 13 01/20/2024 1210   CREATININE 0.81 01/20/2024 1210   CREATININE 0.92 05/30/2020 1453      Component Value Date/Time   CALCIUM  8.8 01/20/2024 1210   ALKPHOS 65 01/20/2024 1210   AST 16  01/20/2024 1210   AST 15 05/30/2020 1453   ALT 13 01/20/2024 1210   ALT 10 05/30/2020 1453   BILITOT 0.5 01/20/2024 1210   BILITOT 0.4 05/30/2020 1453       Impression and Plan: Mary Buck is a very pleasant 88 yo female with history of iron deficiency anemia.  Iron studies are pending. We will replace if needed.  Follow-up in 1 year.   Lauraine Pepper, NP 8/27/202510:24 AM

## 2024-05-17 ENCOUNTER — Telehealth: Payer: Self-pay

## 2024-05-17 DIAGNOSIS — H04123 Dry eye syndrome of bilateral lacrimal glands: Secondary | ICD-10-CM | POA: Diagnosis not present

## 2024-05-17 DIAGNOSIS — H02204 Unspecified lagophthalmos left upper eyelid: Secondary | ICD-10-CM | POA: Diagnosis not present

## 2024-05-17 DIAGNOSIS — H0288A Meibomian gland dysfunction right eye, upper and lower eyelids: Secondary | ICD-10-CM | POA: Diagnosis not present

## 2024-05-17 DIAGNOSIS — H0288B Meibomian gland dysfunction left eye, upper and lower eyelids: Secondary | ICD-10-CM | POA: Diagnosis not present

## 2024-05-17 DIAGNOSIS — H02201 Unspecified lagophthalmos right upper eyelid: Secondary | ICD-10-CM | POA: Diagnosis not present

## 2024-05-17 DIAGNOSIS — H18513 Endothelial corneal dystrophy, bilateral: Secondary | ICD-10-CM | POA: Diagnosis not present

## 2024-05-17 NOTE — Telephone Encounter (Signed)
 Patient called asking if her labs were back. Informed her labs were reviewed and no iron needed at this time. Pt confirmed

## 2024-05-23 ENCOUNTER — Encounter: Payer: Self-pay | Admitting: Family Medicine

## 2024-05-23 DIAGNOSIS — R2681 Unsteadiness on feet: Secondary | ICD-10-CM

## 2024-05-23 DIAGNOSIS — M545 Low back pain, unspecified: Secondary | ICD-10-CM

## 2024-05-26 ENCOUNTER — Encounter: Payer: Medicare Other | Attending: Psychology | Admitting: Psychology

## 2024-05-26 DIAGNOSIS — G479 Sleep disorder, unspecified: Secondary | ICD-10-CM | POA: Diagnosis not present

## 2024-05-26 DIAGNOSIS — G894 Chronic pain syndrome: Secondary | ICD-10-CM | POA: Insufficient documentation

## 2024-05-26 DIAGNOSIS — F331 Major depressive disorder, recurrent, moderate: Secondary | ICD-10-CM | POA: Diagnosis not present

## 2024-05-26 DIAGNOSIS — F411 Generalized anxiety disorder: Secondary | ICD-10-CM | POA: Insufficient documentation
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
## 2024-05-30 NOTE — Addendum Note (Signed)
 Addended by: WATT RAISIN C on: 05/30/2024 04:30 PM   Modules accepted: Orders

## 2024-06-11 ENCOUNTER — Other Ambulatory Visit: Payer: Self-pay | Admitting: Gastroenterology

## 2024-06-16 ENCOUNTER — Encounter: Attending: Psychology | Admitting: Psychology

## 2024-06-16 DIAGNOSIS — F411 Generalized anxiety disorder: Secondary | ICD-10-CM | POA: Diagnosis not present

## 2024-06-16 DIAGNOSIS — G479 Sleep disorder, unspecified: Secondary | ICD-10-CM | POA: Insufficient documentation

## 2024-06-16 DIAGNOSIS — G894 Chronic pain syndrome: Secondary | ICD-10-CM | POA: Diagnosis not present

## 2024-06-16 DIAGNOSIS — F331 Major depressive disorder, recurrent, moderate: Secondary | ICD-10-CM | POA: Diagnosis not present

## 2024-06-21 ENCOUNTER — Encounter: Payer: Self-pay | Admitting: Psychology
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
## 2024-06-22 DIAGNOSIS — M545 Low back pain, unspecified: Secondary | ICD-10-CM | POA: Diagnosis not present

## 2024-06-22 DIAGNOSIS — R2681 Unsteadiness on feet: Secondary | ICD-10-CM | POA: Diagnosis not present

## 2024-06-22 DIAGNOSIS — M48062 Spinal stenosis, lumbar region with neurogenic claudication: Secondary | ICD-10-CM | POA: Diagnosis not present

## 2024-06-22 DIAGNOSIS — G8929 Other chronic pain: Secondary | ICD-10-CM | POA: Diagnosis not present

## 2024-06-22 DIAGNOSIS — R29898 Other symptoms and signs involving the musculoskeletal system: Secondary | ICD-10-CM | POA: Diagnosis not present

## 2024-06-22 DIAGNOSIS — R2689 Other abnormalities of gait and mobility: Secondary | ICD-10-CM | POA: Diagnosis not present

## 2024-06-30 ENCOUNTER — Ambulatory Visit (INDEPENDENT_AMBULATORY_CARE_PROVIDER_SITE_OTHER): Admitting: *Deleted

## 2024-06-30 ENCOUNTER — Encounter: Payer: Self-pay | Admitting: Family Medicine

## 2024-06-30 ENCOUNTER — Telehealth: Payer: Self-pay | Admitting: *Deleted

## 2024-06-30 VITALS — BP 138/88 | HR 55 | Temp 97.9°F | Resp 16 | Ht 64.0 in | Wt 153.4 lb

## 2024-06-30 DIAGNOSIS — Z23 Encounter for immunization: Secondary | ICD-10-CM

## 2024-06-30 DIAGNOSIS — Z0000 Encounter for general adult medical examination without abnormal findings: Secondary | ICD-10-CM

## 2024-06-30 NOTE — Patient Instructions (Addendum)
 Ms. Myricks , Thank you for taking time out of your busy schedule to complete your Annual Wellness Visit with me. I enjoyed our conversation and look forward to speaking with you again next year. I, as well as your care team,  appreciate your ongoing commitment to your health goals. Please review the following plan we discussed and let me know if I can assist you in the future. Your Game plan/ To Do List     Follow up Visits: Next Medicare AWV with our clinical staff: 07/05/25 1pm, in person.    Next Office Visit with your provider: 09/05/24 1pm Dr Watt  Clinician Recommendations:  Aim for 30 minutes of exercise or brisk walking, 6-8 glasses of water, and 5 servings of fruits and vegetables each day.       This is a list of the screening recommended for you and due dates:  Health Maintenance  Topic Date Due   Complete foot exam   Never done   Yearly kidney health urinalysis for diabetes  Never done   Hemoglobin A1C  11/02/2019   COVID-19 Vaccine (9 - 2025-26 season) 05/16/2024   Eye exam for diabetics  07/06/2024   Yearly kidney function blood test for diabetes  01/19/2025   Medicare Annual Wellness Visit  06/30/2025   DTaP/Tdap/Td vaccine (3 - Td or Tdap) 05/08/2030   Pneumococcal Vaccine for age over 61  Completed   Flu Shot  Completed   DEXA scan (bone density measurement)  Completed   Zoster (Shingles) Vaccine  Completed   Meningitis B Vaccine  Aged Out   Breast Cancer Screening  Discontinued   Colon Cancer Screening  Discontinued    Advanced directives: (In Chart) A copy of your advanced directives are scanned into your chart should your provider ever need it. Advance Care Planning is important because it:  [x]  Makes sure you receive the medical care that is consistent with your values, goals, and preferences  [x]  It provides guidance to your family and loved ones and reduces their decisional burden about whether or not they are making the right decisions based on your  wishes.  Follow the link provided in your after visit summary or read over the paperwork we have mailed to you to help you started getting your Advance Directives in place. If you need assistance in completing these, please reach out to us  so that we can help you!  See attachments for Preventive Care and Fall Prevention Tips.

## 2024-06-30 NOTE — Progress Notes (Signed)
 Subjective:   Mary Buck is a 88 y.o. who presents for a Medicare Wellness preventive visit.  As a reminder, Annual Wellness Visits don't include a physical exam, and some assessments may be limited, especially if this visit is performed virtually. We may recommend an in-person follow-up visit with your provider if needed.  Visit Complete: In person  Persons Participating in Visit: Patient.  AWV Questionnaire: Yes: Patient Medicare AWV questionnaire was completed by the patient on 06/27/24; I have confirmed that all information answered by patient is correct and no changes since this date.  Cardiac Risk Factors include: advanced age (>47men, >73 women);dyslipidemia;Other (see comment), Risk factor comments: Asthma, Hx of bladder cancer     Objective:    Today's Vitals   06/30/24 1306 06/30/24 1343  BP: (!) 147/67 138/88  Pulse: (!) 55   Resp: 16   Temp: 97.9 F (36.6 C)   TempSrc: Oral   SpO2: 95%   Weight: 153 lb 6.4 oz (69.6 kg)   Height: 5' 4 (1.626 m)    Body mass index is 26.33 kg/m.     06/30/2024    2:14 PM 05/11/2024   10:44 AM 03/16/2024   12:07 PM 11/10/2023    1:29 PM 10/30/2023    2:25 PM 06/21/2023    2:11 PM 01/20/2023    6:40 PM  Advanced Directives  Does Patient Have a Medical Advance Directive? Yes Yes Yes Yes Yes No Yes  Type of Estate agent of [place];Living will Healthcare Power of [place];Living will Living will;Healthcare Power of [employer] Power of [place];Living will Healthcare Power of [place];Living will  Healthcare Power of [place];Living will  Does patient want to make changes to medical advance directive? No - Patient declined   No - Patient declined   No - Patient declined  Copy of Healthcare Power of Attorney in Chart? Yes - validated most recent copy scanned in chart (See row information) No - copy requested   No - copy requested    Would patient like information on creating a medical advance  directive?      No - Patient declined     Current Medications (verified) Outpatient Encounter Medications as of 06/30/2024  Medication Sig   acetaminophen  (TYLENOL ) 325 MG tablet Take 650 mg by mouth 3 (three) times daily as needed.   Carboxymethylcellulose Sod PF (LUBRICANT EYE DROPS PF) 0.5 % SOLN Place 1 drop into both eyes 4 (four) times daily as needed (dry eyes).   famotidine  (PEPCID ) 20 MG tablet TAKE 1 TABLET(20 MG) BY MOUTH AT BEDTIME   gabapentin  (NEURONTIN ) 100 MG capsule Take 100 mg by mouth 3 (three) times daily.   lansoprazole  (PREVACID ) 30 MG capsule Take 1 capsule (30 mg total) by mouth 2 (two) times daily.   levothyroxine  (SYNTHROID ) 88 MCG tablet Take 1 tablet (88 mcg total) by mouth daily before breakfast.   misoprostol  (CYTOTEC ) 100 MCG tablet TAKE 2 TABLET BY MOUTH AFTER BREAKFAST AND AFTER DINNER AS DIRECTED. KEEP APPOINTMENT FOR FURTHER FILLS.   mupirocin  ointment (BACTROBAN ) 2 % Apply 1 Application topically 2 (two) times daily. Use as needed for nose sores   Probiotic Product (RISAQUAD PO) Take 1 capsule by mouth daily.   simvastatin  (ZOCOR ) 20 MG tablet Take 1 tablet (20 mg total) by mouth daily.   traMADol  (ULTRAM ) 50 MG tablet Take 50 mg by mouth.   Vitamin D-Vitamin K (VITAMIN K2-VITAMIN D3 PO) Take 1 tablet by mouth daily.   [DISCONTINUED] aspirin EC  81 MG tablet Take 81 mg by mouth daily. Swallow whole.   [DISCONTINUED] erythromycin  ophthalmic ointment Place 1 Application into the right eye in the morning and at bedtime.   [DISCONTINUED] lidocaine  (LIDODERM ) 5 % Place 1 patch onto the skin daily. Remove & Discard patch within 12 hours or as directed by MD   No facility-administered encounter medications on file as of 06/30/2024.    Allergies (verified) Contrast media [iodinated contrast media], Other, Oxycodone, Latex, Mirtazapine, Sulfur, Acitretin, Cephalexin, Elemental sulfur, Penicillins, and Sulfa antibiotics   History: Past Medical History:   Diagnosis Date   Allergy    Asthma    as teen   Cataract    DDD (degenerative disc disease), lumbar 06/01/2017   Depression    GERD (gastroesophageal reflux disease)    Heart murmur    no issues   Hyperlipidemia    Hypothyroidism    IDA (iron deficiency anemia) 05/31/2020   Peripheral neuropathy 04/30/2017   Skin cancer    left leg had radiation, bladder cancer   Sleep apnea    no CPAP   Thyroid  disease    Past Surgical History:  Procedure Laterality Date   bladder cancer  2013   CARPAL TUNNEL RELEASE  2015   CATARACT EXTRACTION Bilateral 2012   CHOLECYSTECTOMY     COLONOSCOPY WITH ESOPHAGOGASTRODUODENOSCOPY (EGD)  02/22/2018   [facility]   SALIVARY GLAND SURGERY Left 2015   benign   SPINE SURGERY  6/31/21   vertaflex   TONSILLECTOMY AND ADENOIDECTOMY  1943   TOTAL HIP ARTHROPLASTY Left 01/20/2023   Procedure: TOTAL HIP ARTHROPLASTY;  Surgeon: Josefina Chew, MD;  Location: [facility];  Service: Orthopedics;  Laterality: Left;   Family History  Problem Relation Age of Onset   Macular degeneration Mother    Hyperlipidemia Father    Stroke Father    Cancer Brother        ? stomach   Prostate cancer Other        in brothers   Colon cancer Neg Hx    Social History   Socioeconomic History   Marital status: Significant Other    Spouse name: Not on file   Number of children: 0   Years of education: 16   Highest education level: Bachelor's degree (e.g., BA, AB, BS)  Occupational History   Occupation: retired  Tobacco Use   Smoking status: Former    Current packs/day: 0.00    Types: Cigarettes    Quit date: 2015    Years since quitting: 10.7   Smokeless tobacco: Never  Vaping Use   Vaping status: Never Used  Substance and Sexual Activity   Alcohol  use: No   Drug use: No   Sexual activity: Not Currently  Other Topics Concern   Not on file  Social History Narrative   Right handed   Married, 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    Caffeine-4-5 cups daily   Social Drivers of Health   Financial Resource Strain: Low Risk  (06/27/2024)   Overall Financial Resource Strain (CARDIA)    Difficulty of Paying Living Expenses: Not hard at all  Food Insecurity: No Food Insecurity (06/30/2024)   Hunger Vital Sign    Worried About Running Out of Food in the Last Year: Never true    Ran Out of Food in the Last Year: Never true  Transportation Needs: No Transportation Needs (06/27/2024)   PRAPARE - Administrator, Civil Service (Medical): No  Lack of Transportation (Non-Medical): No  Physical Activity: Insufficiently Active (06/30/2024)   Exercise Vital Sign    Days of Exercise per Week: 7 days    Minutes of Exercise per Session: 20 min  Stress: Stress Concern Present (06/30/2024)   [employer] of Occupational Health - Occupational Stress Questionnaire    Feeling of Stress: To some extent  Social Connections: Moderately Isolated (06/30/2024)   Social Connection and Isolation Panel    Frequency of Communication with Friends and Family: Twice a week    Frequency of Social Gatherings with Friends and Family: Never    Attends Religious Services: More than 4 times per year    Active Member of [employer] or Organizations: No    Attends Engineer, structural: Never    Marital Status: Living with partner    Tobacco Counseling Counseling given: Not Answered    Clinical Intake:  Pre-visit preparation completed: Yes  Pain : No/denies pain     BMI - recorded: 26.33 Nutritional Status: BMI 25 -29 Overweight Nutritional Risks: None Diabetes: No  Lab Results  Component Value Date   HGBA1C 6.1 05/02/2019     How often do you need to have someone help you when you read instructions, pamphlets, or other written materials from your doctor or pharmacy?: 1 - Never What is the last grade level you completed in school?: college  Interpreter Needed?: No  Information entered by :: Lolita Maree MARINER)   Activities of Daily Living     06/27/2024    5:29 PM  In your present state of health, do you have any difficulty performing the following activities:  Hearing? 1  Vision? 0  Difficulty concentrating or making decisions? 0  Walking or climbing stairs? 0  Dressing or bathing? 0  Doing errands, shopping? 0  Preparing Food and eating ? N  Using the Toilet? N  In the past six months, have you accidently leaked urine? Y  Comment occurs rarely. usually if she doesn't wake up in time.  Do you have problems with loss of bowel control? N  Managing your Medications? N  Managing your Finances? N  Housekeeping or managing your Housekeeping? N    Patient Care Team: Copland, Harlene BROCKS, MD as PCP - General (Family Medicine) Alvan Ronal BRAVO, MD (Inactive) as PCP - Cardiology (Cardiology)  I have updated your Care Teams any recent Medical Services you may have received from other providers in the past year.     Assessment:   This is a routine wellness examination for Klare.  Hearing/Vision screen Hearing Screening - Comments:: Pt notes some difficulty over the last year. Will discuss with PCP in December. Vision Screening - Comments:: Up to date with routine eye exams with Dr Huey / [facility].   Goals Addressed   None    Depression Screen     06/30/2024    1:29 PM 05/11/2024   10:44 AM 05/02/2024    1:11 PM 03/23/2024    2:38 PM 04/15/2023    1:13 PM 11/13/2022    1:19 PM 11/10/2022    2:10 PM  PHQ 2/9 Scores  PHQ - 2 Score 1 0 1 0 0 0 0  PHQ- 9 Score    0 0  0    Fall Risk     06/27/2024    5:29 PM 05/02/2024    1:10 PM 03/23/2024    2:37 PM 03/12/2023   11:49 AM 11/13/2022    1:18 PM  Fall Risk  Falls in the past year? 1 1 1 1  0  Number falls in past yr: 0 1 0 0 0  Injury with Fall? 1 1 1  0 0  Risk for fall due to : Orthopedic patient;Impaired balance/gait;Impaired mobility History of fall(s) No Fall Risks Impaired balance/gait No Fall Risks  Follow up  Education provided Falls evaluation completed Falls evaluation completed Falls evaluation completed Falls evaluation completed    MEDICARE RISK AT HOME:  Medicare Risk at Home Any stairs in or around the home?: (Patient-Rptd) No Home free of loose throw rugs in walkways, pet beds, electrical cords, etc?: (Patient-Rptd) No Adequate lighting in your home to reduce risk of falls?: (Patient-Rptd) Yes Life alert?: (Patient-Rptd) No Use of a cane, walker or w/c?: (Patient-Rptd) Yes Grab bars in the bathroom?: (Patient-Rptd) Yes Shower chair or bench in shower?: (Patient-Rptd) Yes Elevated toilet seat or a handicapped toilet?: (Patient-Rptd) Yes  TIMED UP AND GO:  Was the test performed?  Yes  Length of time to ambulate 10 feet: 10 sec Gait slow and steady without use of assistive device  Cognitive Function: 6CIT completed    11/13/2022    2:06 PM 07/26/2020   10:32 AM  MMSE - Mini Mental State Exam  Not completed: Unable to complete   Orientation to time  5  Orientation to Place  5  Registration  3  Attention/ Calculation  5  Recall  3  Language- name 2 objects  2  Language- repeat  1  Language- follow 3 step command  3  Language- read & follow direction  1  Write a sentence  1  Copy design  1  Total score  30        06/30/2024    1:32 PM 03/28/2020    2:20 PM  6CIT Screen  What Year? 0 points 0 points  What month? 0 points 0 points  What time? 0 points 0 points  Count back from 20 0 points 0 points  Months in reverse 0 points 0 points  Repeat phrase 0 points 0 points  Total Score 0 points 0 points    Immunizations Immunization History  Administered Date(s) Administered    sv, Bivalent, Protein Subunit Rsvpref,pf (Abrysvo) 09/06/2022   Fluad Quad(high Dose 65+) 06/13/2020, 06/27/2021   INFLUENZA, HIGH DOSE SEASONAL PF 05/21/2010, 06/30/2014, 06/14/2017, 07/01/2023, 06/30/2024   Influenza Split 06/15/2009, 05/29/2011, 06/14/2013   Influenza, Seasonal, Injecte,  Preservative Fre 05/21/2010   Influenza,inj,Quad PF,6+ Mos 06/18/2015   Influenza,inj,quad, With Preservative 06/30/2014   Influenza-Unspecified 06/15/2009, 05/29/2011, 10/21/2012, 06/14/2013, 06/18/2015, 06/13/2020, 07/11/2022   Novavax(Covid-19) Vaccine 05/19/2023   PFIZER Comirnaty(Gray Top)Covid-19 Tri-Sucrose Vaccine 03/29/2021   PFIZER(Purple Top)SARS-COV-2 Vaccination 10/20/2019, 11/10/2019, 06/22/2020   Pfizer Covid-19 Vaccine Bivalent Booster 35yrs & up 06/27/2021   Pfizer(Comirnaty)Fall Seasonal Vaccine 12 years and older 05/20/2023, 12/19/2023   Pneumococcal Conjugate-13 08/27/2004, 10/05/2014   Pneumococcal Polysaccharide-23 08/27/2004   Pneumococcal-Unspecified 08/27/2004   Td 05/08/2020   Tdap 12/13/2009   Unspecified SARS-COV-2 Vaccination 06/18/2022   Zoster Recombinant(Shingrix) 01/01/2022, 04/09/2022   Zoster, Live 09/21/2013    Screening Tests Health Maintenance  Topic Date Due   FOOT EXAM  Never done   Diabetic kidney evaluation - Urine ACR  Never done   HEMOGLOBIN A1C  11/02/2019   Medicare Annual Wellness (AWV)  11/13/2023   COVID-19 Vaccine (9 - 2025-26 season) 05/16/2024   OPHTHALMOLOGY EXAM  07/06/2024   Diabetic kidney evaluation - eGFR measurement  01/19/2025   DTaP/Tdap/Td (3 - Td or Tdap) 05/08/2030  Pneumococcal Vaccine: 50+ Years  Completed   Influenza Vaccine  Completed   DEXA SCAN  Completed   Zoster Vaccines- Shingrix  Completed   Meningococcal B Vaccine  Aged Out   Mammogram  Discontinued   Colonoscopy  Discontinued    Health Maintenance Items Addressed: Flu vaccine given today. Will get Covid vaccine at pharmacy.  Additional Screening:  Vision Screening: Recommended annual ophthalmology exams for early detection of glaucoma and other disorders of the eye. Is the patient up to date with their annual eye exam?  Yes  Who is the provider or what is the name of the office in which the patient attends annual eye exams? [facility] / Dr  Huey  Dental Screening: Recommended annual dental exams for proper oral hygiene  Community Resource Referral / Chronic Care Management: CRR required this visit?  No   CCM required this visit?  No   Plan:    I have personally reviewed and noted the following in the patient's chart:   Medical and social history Use of alcohol , tobacco or illicit drugs  Current medications and supplements including opioid prescriptions. Patient is not currently taking opioid prescriptions. Functional ability and status Nutritional status Physical activity Advanced directives List of other physicians Hospitalizations, surgeries, and ER visits in previous 12 months Vitals Screenings to include cognitive, depression, and falls Referrals and appointments  In addition, I have reviewed and discussed with patient certain preventive protocols, quality metrics, and best practice recommendations. A written personalized care plan for preventive services as well as general preventive health recommendations were provided to patient.   Lolita Libra, CMA   06/30/2024   After Visit Summary: (In Person-Printed) AVS printed and given to the patient  Notes: see phone note

## 2024-06-30 NOTE — Telephone Encounter (Signed)
 Pt had AWV today.  There is a diagnosis on her problem list of diabetes due to underlying condition w/oth diabetic neuro comp.  I cannot find in her chart definitive diagnosis of diabetes.  Is this dx accurate or does it need to be removed from her record?  Pt denies having known diabetes.  Please advise/

## 2024-07-05 DIAGNOSIS — M47816 Spondylosis without myelopathy or radiculopathy, lumbar region: Secondary | ICD-10-CM | POA: Diagnosis not present

## 2024-07-05 DIAGNOSIS — M48062 Spinal stenosis, lumbar region with neurogenic claudication: Secondary | ICD-10-CM | POA: Diagnosis not present

## 2024-07-05 DIAGNOSIS — Z79899 Other long term (current) drug therapy: Secondary | ICD-10-CM | POA: Diagnosis not present

## 2024-07-06 ENCOUNTER — Encounter (HOSPITAL_BASED_OUTPATIENT_CLINIC_OR_DEPARTMENT_OTHER): Payer: Medicare Other | Admitting: Psychology

## 2024-07-06 DIAGNOSIS — F411 Generalized anxiety disorder: Secondary | ICD-10-CM

## 2024-07-06 DIAGNOSIS — G479 Sleep disorder, unspecified: Secondary | ICD-10-CM

## 2024-07-06 DIAGNOSIS — F331 Major depressive disorder, recurrent, moderate: Secondary | ICD-10-CM | POA: Diagnosis not present

## 2024-07-06 DIAGNOSIS — G894 Chronic pain syndrome: Secondary | ICD-10-CM | POA: Diagnosis not present

## 2024-07-12 ENCOUNTER — Encounter: Payer: Self-pay | Admitting: Psychology
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
## 2024-07-13 DIAGNOSIS — H0288A Meibomian gland dysfunction right eye, upper and lower eyelids: Secondary | ICD-10-CM | POA: Diagnosis not present

## 2024-07-13 DIAGNOSIS — H35373 Puckering of macula, bilateral: Secondary | ICD-10-CM | POA: Diagnosis not present

## 2024-07-13 DIAGNOSIS — H524 Presbyopia: Secondary | ICD-10-CM | POA: Diagnosis not present

## 2024-07-13 DIAGNOSIS — H04123 Dry eye syndrome of bilateral lacrimal glands: Secondary | ICD-10-CM | POA: Diagnosis not present

## 2024-07-13 DIAGNOSIS — M545 Low back pain, unspecified: Secondary | ICD-10-CM | POA: Diagnosis not present

## 2024-07-13 DIAGNOSIS — R2681 Unsteadiness on feet: Secondary | ICD-10-CM | POA: Diagnosis not present

## 2024-07-13 DIAGNOSIS — H02201 Unspecified lagophthalmos right upper eyelid: Secondary | ICD-10-CM | POA: Diagnosis not present

## 2024-07-13 DIAGNOSIS — H0288B Meibomian gland dysfunction left eye, upper and lower eyelids: Secondary | ICD-10-CM | POA: Diagnosis not present

## 2024-07-13 DIAGNOSIS — H43813 Vitreous degeneration, bilateral: Secondary | ICD-10-CM | POA: Diagnosis not present

## 2024-07-13 DIAGNOSIS — R2689 Other abnormalities of gait and mobility: Secondary | ICD-10-CM | POA: Diagnosis not present

## 2024-07-13 DIAGNOSIS — H02204 Unspecified lagophthalmos left upper eyelid: Secondary | ICD-10-CM | POA: Diagnosis not present

## 2024-07-13 DIAGNOSIS — R29898 Other symptoms and signs involving the musculoskeletal system: Secondary | ICD-10-CM | POA: Diagnosis not present

## 2024-07-13 DIAGNOSIS — H353131 Nonexudative age-related macular degeneration, bilateral, early dry stage: Secondary | ICD-10-CM | POA: Diagnosis not present

## 2024-07-13 DIAGNOSIS — H18513 Endothelial corneal dystrophy, bilateral: Secondary | ICD-10-CM | POA: Diagnosis not present

## 2024-07-13 DIAGNOSIS — H5203 Hypermetropia, bilateral: Secondary | ICD-10-CM | POA: Diagnosis not present

## 2024-07-13 DIAGNOSIS — M48062 Spinal stenosis, lumbar region with neurogenic claudication: Secondary | ICD-10-CM | POA: Diagnosis not present

## 2024-07-13 DIAGNOSIS — G8929 Other chronic pain: Secondary | ICD-10-CM | POA: Diagnosis not present

## 2024-07-13 DIAGNOSIS — H52203 Unspecified astigmatism, bilateral: Secondary | ICD-10-CM | POA: Diagnosis not present

## 2024-07-15 DIAGNOSIS — R2681 Unsteadiness on feet: Secondary | ICD-10-CM | POA: Diagnosis not present

## 2024-07-15 DIAGNOSIS — R29898 Other symptoms and signs involving the musculoskeletal system: Secondary | ICD-10-CM | POA: Diagnosis not present

## 2024-07-15 DIAGNOSIS — M48062 Spinal stenosis, lumbar region with neurogenic claudication: Secondary | ICD-10-CM | POA: Diagnosis not present

## 2024-07-15 DIAGNOSIS — G8929 Other chronic pain: Secondary | ICD-10-CM | POA: Diagnosis not present

## 2024-07-15 DIAGNOSIS — R2689 Other abnormalities of gait and mobility: Secondary | ICD-10-CM | POA: Diagnosis not present

## 2024-07-15 DIAGNOSIS — M545 Low back pain, unspecified: Secondary | ICD-10-CM | POA: Diagnosis not present

## 2024-07-18 ENCOUNTER — Encounter: Payer: Self-pay | Admitting: Psychology

## 2024-07-18 ENCOUNTER — Encounter: Attending: Psychology | Admitting: Psychology

## 2024-07-18 DIAGNOSIS — G4733 Obstructive sleep apnea (adult) (pediatric): Secondary | ICD-10-CM | POA: Insufficient documentation

## 2024-07-18 DIAGNOSIS — G894 Chronic pain syndrome: Secondary | ICD-10-CM | POA: Diagnosis not present

## 2024-07-18 DIAGNOSIS — F411 Generalized anxiety disorder: Secondary | ICD-10-CM | POA: Insufficient documentation

## 2024-07-18 DIAGNOSIS — G479 Sleep disorder, unspecified: Secondary | ICD-10-CM | POA: Diagnosis not present

## 2024-07-18 DIAGNOSIS — F331 Major depressive disorder, recurrent, moderate: Secondary | ICD-10-CM | POA: Insufficient documentation

## 2024-07-18 NOTE — Progress Notes (Signed)
 Neuropsychology Visit  Patient:  Mary Buck   DOB: 1935-12-02  MR Number: [id number]  Location: [facility] [place] [facility] [address] [place], [address] [place] [address] Dept: [phone]  Date of Service: 07/18/2024  Start: 1 PM End: 2 PM  Today's visit was an in person visit was conducted in my outpatient clinic office.  The patient myself were present.  The visit today included the use of a digital scribe with its reason for use and aspect of HIPAA compliance and how the information would be used reviewed with the patient with patient complying and allowing to use a digital scribe to aid in them taking today.  Duration of Service: 1 Hour  Provider/Observer:     Norleen JONELLE Asa PsyD  Chief Complaint:      Chief Complaint  Patient presents with   Depression   Pain   Sleeping Problem   Stress   Back Pain    Reason For Service:     Mary Buck is an 88 year old female with a history of anxiety and depressive symptomatology since adolescence. History of insomnia since age 73, which began following a snake encounter and associated worry about being bitten. OCD and anxiety symptoms have been persistent throughout life. A recent neuropsychological evaluation for cognitive changes showed good performance on most measures; memory issues are considered likely related to anxiety and normal age-related changes. She has been followed by Dr. Zusman for psychotherapeutic interventions and is now following up with this writer after his departure from the practice.  Treatment Interventions:  Psychoeducation was provided on the nature of happiness, fear, and human behavior. Explored the concept of happiness as a relative state rather than an absolute goal, emphasizing that progress is measured by feeling slightly better than the day before. Discussed the adaptive role of fear in ensuring safety,  particularly given age-related physical changes, while also addressing how disproportionate fear can become maladaptive. Introduced concepts of smart selfishness versus dumb selfishness in interpersonal relationships to promote healthier interactions. Utilized psychoeducation on the neurological basis of human behavior, including learning disabilities, cognitive biases, and emotional responses, to normalize the individual's experiences. The role of forgiveness as a self-beneficial act of letting go of retribution was also explored.  Participation Level:   Active  Participation Quality:  Appropriate      Behavioral Observation:  Well Groomed, Alert, and Appropriate.   Current Psychosocial Factors: Reports considering a move, but finding the process and the thought of it being the last move difficult to process. She and her partner spend significant time apart in the house, which sometimes leads to feelings of insecurity, though she recognizes they both value solitude. She feels a need to sort through numerous personal journals before moving, which feels like a daunting task. Reports feeling less than or having a limited perspective compared to peers who focus on global issues, whereas she prefers to avoid negative news and focus on local or personal matters. She expressed feelings of unattractiveness in adolescence related to her hair, creating a persistent negative self-image from that time. Expressed ongoing feelings of depression.  Content of Session:    Reviewed recent activities, including a trip to [place]  where a compromise was reached with her partner about driving responsibilities. Discussed the decision not to relocate to [place]  and ongoing exploration of a local living facility. Explored feelings related to an upcoming move, including the emotional weight of it being the last move. Discussed  participation in a writing group and the different perspectives on writing topics (global  vs. personal), which prompted feelings of inadequacy. Therapeutic intervention focused on psychoeducation regarding the impact of constant negative news exposure and the value of curating one's attention. Explored the concept of creating an idealized past self (golem) and how comparing the present self to this creation can lead to feelings of depression and frustration. Discussed the importance of living in the present and not being handcuffed by the past. Provided psychoeducation on the metabolic effects of diet, particularly refined carbohydrates, and the importance of monitoring blood glucose and kidney function.  Effectiveness of Interventions: Engaged well with psychoeducational topics. Acknowledged the logic in limiting negative news and being a guardian of one's attention. Responded positively to the discussion about the golem concept as an explanation for persistent depressive feelings. She appeared to appreciate the reframe that her partner letting her drive was a result of her successful communication, not just permission-giving.  Target Goals:   We continue to work issues related to coping skills around her anxiety and adjustment to significant changes in physical functioning.  Patient continues with significant back pain and gait changes that limit her physical activity.  Patient has been diagnosed with obstructive sleep apnea but has not been able to tolerate a CPAP machine when she was initially in the fitting.  The patient's loss of function continues to be quite problematic.  The patient continues to do very well cognitively with good memory, reasoning and executive functioning etc.  Goals Last Reviewed:   07/18/2024  Goals Addressed Today:    Follow-up on previous work related to interpersonal communication with her partner, noting a successful negotiation of driving duties. Addressed catastrophizing and negative self-comparison related to an idealized past self (the golem concept)  and its contribution to depressive symptoms.  Impression/Diagnosis:   Mary Buck is an 88 year old female who has a history of anxiety and depressive symptomatology going back to adolescence.  Patient has had issues with insomnia since age 55 that were started around the time when she had an encounter with a snake and stayed up all night worrying that she had been bitten with a venomous bite despite absence of physical signs or symptoms.  OCD and anxiety have persisted throughout life.  Patient had a recent neuropsychological evaluation due to concerns of cognitive changes but did quite well on almost all neuropsychological measures and memory issues are likely related to anxiety and normal age-related changes.  The patient has been followed by Dr. Zusman for psychotherapeutic interventions and after he left the practice she is following up with myself.  Utilized principles of Cognitive Behavioral Therapy (CBT) to reframe thoughts about dependency and contribution to the household. - Explored the Patient's interpretation of [name]'s behavior through the lens of family history (child of an alcoholic) to foster understanding and reduce the Patient's anxiety. - Encouraged the Patient to identify small tasks they can still perform or ways to be present and supportive while Larry completes chores. - Discussed the nature of the rib fracture (non-displaced) and the healing process. - Provided education on pain as a protective mechanism. - Advised the Patient to continue with physical therapy, informing the therapist of the rib fracture, to reduce fall risk.  RISK ASSESSMENT AND MANAGEMENT: - Suicidal Ideation: No current suicidal ideation reported. Discussion of suicide was in the context of family history (cousin's son, friend's mother). - Homicidal Ideation: None reported. - Self-harm: None reported. - Violence & Aggression: None reported.  PLAN FOR NEXT SESSION - Continue to explore coping  strategies for managing anxiety related to physical limitations and dependency. - Discuss travel plans and any associated anxieties.  Diagnosis:   Moderate episode of recurrent major depressive disorder (HCC)  Generalized anxiety disorder  Chronic pain syndrome  Sleep disturbance    Norleen Asa, Psy.D. Clinical Psychologist Neuropsychologist

## 2024-07-20 DIAGNOSIS — M545 Low back pain, unspecified: Secondary | ICD-10-CM | POA: Diagnosis not present

## 2024-07-20 DIAGNOSIS — R29898 Other symptoms and signs involving the musculoskeletal system: Secondary | ICD-10-CM | POA: Diagnosis not present

## 2024-07-20 DIAGNOSIS — R2689 Other abnormalities of gait and mobility: Secondary | ICD-10-CM | POA: Diagnosis not present

## 2024-07-20 DIAGNOSIS — R2681 Unsteadiness on feet: Secondary | ICD-10-CM | POA: Diagnosis not present

## 2024-07-20 DIAGNOSIS — G8929 Other chronic pain: Secondary | ICD-10-CM | POA: Diagnosis not present

## 2024-07-20 DIAGNOSIS — M48062 Spinal stenosis, lumbar region with neurogenic claudication: Secondary | ICD-10-CM | POA: Diagnosis not present

## 2024-07-22 DIAGNOSIS — M48062 Spinal stenosis, lumbar region with neurogenic claudication: Secondary | ICD-10-CM | POA: Diagnosis not present

## 2024-07-22 DIAGNOSIS — R2681 Unsteadiness on feet: Secondary | ICD-10-CM | POA: Diagnosis not present

## 2024-07-22 DIAGNOSIS — R29898 Other symptoms and signs involving the musculoskeletal system: Secondary | ICD-10-CM | POA: Diagnosis not present

## 2024-07-22 DIAGNOSIS — M545 Low back pain, unspecified: Secondary | ICD-10-CM | POA: Diagnosis not present

## 2024-07-22 DIAGNOSIS — R2689 Other abnormalities of gait and mobility: Secondary | ICD-10-CM | POA: Diagnosis not present

## 2024-07-22 DIAGNOSIS — G8929 Other chronic pain: Secondary | ICD-10-CM | POA: Diagnosis not present

## 2024-07-25 NOTE — Patient Instructions (Signed)
 It was good to see you again today!

## 2024-07-25 NOTE — Progress Notes (Unsigned)
 [facility] [address], [address] [place], [place] [address] [phone]  Date:  07/28/2024   Name:  Mary Buck   DOB:  10-Oct-1935   MRN:  [id number]  PCP:  Watt Harlene JAYSON, MD    Chief Complaint: No chief complaint on file.   History of Present Illness:  Mary Buck is a 88 y.o. very pleasant female patient who presents with the following:  Patient seen today with concern of blisters on her feet and legs.  I saw her most recently in August when she was concerned about conjunctivitis She has history of osteopenia, degenerative disc disease in her back and spinal stenosis, bladder cancer, fairly extensive lower extremity skin cancer, hypothyroidism, hyperlipidemia, asthma and allergies   She also did mention concern about blisters on her feet back in August, at that time her exam appeared most consistent with eczema  Discussed the use of AI scribe software for clinical note transcription with the patient, who gave verbal consent to proceed.  History of Present Illness      Patient Active Problem List   Diagnosis Date Noted   Diarrhea 04/12/2024   Hordeolum externum of right lower eyelid 04/12/2024   S/P hip replacement, left 01/20/2023   Thyroid  disease    Heart murmur    GERD (gastroesophageal reflux disease)    Depression    Cataract    Cancer (HCC)    Asthma    Allergy    IDA (iron deficiency anemia) 05/31/2020   Osteopenia 05/30/2019   Pedal edema 01/31/2018   Squamous cell carcinoma of skin of left lower limb, including hip 12/16/2017   Plantar fasciitis 07/07/2017   DDD (degenerative disc disease), lumbar 06/01/2017   Fibromyalgia 06/01/2017   Spinal stenosis at L4-L5 level 04/30/2017   Peripheral neuropathy 04/30/2017   Encounter for therapeutic drug monitoring 03/05/2017   Bladder cancer (HCC) 08/20/2015   Arthropathy of lumbar facet joint 07/27/2015   Spinal stenosis of cervical  region 07/27/2015   Spondylolisthesis 07/27/2015   Hypothyroid 07/05/2015   Abdominal pain, acute, left upper quadrant 06/28/2015   Basal cell carcinoma, face 06/28/2015   Left groin pain 06/28/2015   Chronic pain 06/28/2015   Fatigue 06/28/2015   Hyperlipidemia 06/28/2015   Bilateral hip pain 06/28/2015   Paresthesia of lower lip 06/28/2015   Pulmonary nodule 06/28/2015   Pustulosis palmaris et plantaris 06/28/2015   Ulcer of nose 06/28/2015   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   Mass of parotid gland 03/24/2014    Past Medical History:  Diagnosis Date   Allergy    Asthma    as teen   Cataract    DDD (degenerative disc disease), lumbar 06/01/2017   Depression    GERD (gastroesophageal reflux disease)    Heart murmur    no issues   Hyperlipidemia    Hypothyroidism    IDA (iron deficiency anemia) 05/31/2020   Peripheral neuropathy 04/30/2017   Skin cancer    left leg had radiation, bladder cancer   Sleep apnea    no CPAP   Thyroid  disease     Past Surgical History:  Procedure Laterality Date   bladder cancer  2013   CARPAL TUNNEL RELEASE  2015   CATARACT EXTRACTION Bilateral 2012   CHOLECYSTECTOMY     COLONOSCOPY WITH ESOPHAGOGASTRODUODENOSCOPY (EGD)  02/22/2018   [facility]   SALIVARY GLAND SURGERY Left 2015   benign   SPINE SURGERY  6/31/21   vertaflex   TONSILLECTOMY AND ADENOIDECTOMY  1943   TOTAL HIP ARTHROPLASTY Left 01/20/2023   Procedure: TOTAL HIP ARTHROPLASTY;  Surgeon: Josefina Chew, MD;  Location: [facility];  Service: Orthopedics;  Laterality: Left;    Social History   Tobacco Use   Smoking status: Former    Current packs/day: 0.00    Types: Cigarettes    Quit date: 2015    Years since quitting: 10.8   Smokeless tobacco: Never  Vaping Use   Vaping status: Never Used  Substance Use Topics   Alcohol  use: No   Drug use: No    Family History  Problem Relation Age of Onset   Macular degeneration Mother    Hyperlipidemia  Father    Stroke Father    Cancer Brother        ? stomach   Prostate cancer Other        in brothers   Colon cancer Neg Hx     Allergies  Allergen Reactions   Contrast Media [Iodinated Contrast Media] Anaphylaxis   Other Anaphylaxis    Sulfa    X-ray diagnostic materials   Oxycodone Anaphylaxis    my throat swells with any opioid (tolerated hydrocodone  01/21/23)   Latex Other (See Comments)    Other reaction(s): blistering, redness    Mirtazapine Nausea Only   Sulfur Other (See Comments)   Acitretin Rash   Cephalexin Other (See Comments)    Unknown (tolerated Ancef  01/21/23)   Elemental Sulfur Other (See Comments)    unknown   Penicillins Other (See Comments)    Unknown   Sulfa Antibiotics Other (See Comments)    Unknown    Medication list has been reviewed and updated.  Current Outpatient Medications on File Prior to Visit  Medication Sig Dispense Refill   acetaminophen  (TYLENOL ) 325 MG tablet Take 650 mg by mouth 3 (three) times daily as needed.     Carboxymethylcellulose Sod PF (LUBRICANT EYE DROPS PF) 0.5 % SOLN Place 1 drop into both eyes 4 (four) times daily as needed (dry eyes).     famotidine  (PEPCID ) 20 MG tablet TAKE 1 TABLET(20 MG) BY MOUTH AT BEDTIME 90 tablet 0   gabapentin  (NEURONTIN ) 100 MG capsule Take 100 mg by mouth 3 (three) times daily.     lansoprazole  (PREVACID ) 30 MG capsule Take 1 capsule (30 mg total) by mouth 2 (two) times daily. 180 capsule 1   levothyroxine  (SYNTHROID ) 88 MCG tablet Take 1 tablet (88 mcg total) by mouth daily before breakfast. 90 tablet 1   misoprostol  (CYTOTEC ) 100 MCG tablet TAKE 2 TABLET BY MOUTH AFTER BREAKFAST AND AFTER DINNER AS DIRECTED. KEEP APPOINTMENT FOR FURTHER FILLS. 120 tablet 5   mupirocin  ointment (BACTROBAN ) 2 % Apply 1 Application topically 2 (two) times daily. Use as needed for nose sores 22 g 0   Probiotic Product (RISAQUAD PO) Take 1 capsule by mouth daily.     simvastatin  (ZOCOR ) 20 MG tablet Take 1 tablet  (20 mg total) by mouth daily. 90 tablet 1   traMADol  (ULTRAM ) 50 MG tablet Take 50 mg by mouth.     Vitamin D-Vitamin K (VITAMIN K2-VITAMIN D3 PO) Take 1 tablet by mouth daily.     No current facility-administered medications on file prior to visit.    Review of Systems:  As per HPI- otherwise negative.   Physical Examination: There were no vitals filed for this visit. There were no vitals filed for this visit. There is no height  or weight on file to calculate BMI. Ideal Body Weight:    GEN: no acute distress. HEENT: Atraumatic, Normocephalic.  Ears and Nose: No external deformity. CV: RRR, No M/G/R. No JVD. No thrill. No extra heart sounds. PULM: CTA B, no wheezes, crackles, rhonchi. No retractions. No resp. distress. No accessory muscle use. ABD: S, NT, ND, +BS. No rebound. No HSM. EXTR: No c/c/e PSYCH: Normally interactive. Conversant.    Assessment and Plan: No diagnosis found.  Assessment & Plan   Signed Harlene Schroeder, MD

## 2024-07-28 ENCOUNTER — Other Ambulatory Visit: Payer: Self-pay

## 2024-07-28 ENCOUNTER — Encounter: Payer: Self-pay | Admitting: Family Medicine

## 2024-07-28 ENCOUNTER — Other Ambulatory Visit (HOSPITAL_BASED_OUTPATIENT_CLINIC_OR_DEPARTMENT_OTHER): Payer: Self-pay

## 2024-07-28 ENCOUNTER — Ambulatory Visit (INDEPENDENT_AMBULATORY_CARE_PROVIDER_SITE_OTHER): Admitting: Family Medicine

## 2024-07-28 VITALS — BP 134/72 | HR 64 | Ht 64.0 in | Wt 153.8 lb

## 2024-07-28 DIAGNOSIS — Z23 Encounter for immunization: Secondary | ICD-10-CM | POA: Diagnosis not present

## 2024-07-28 DIAGNOSIS — L03119 Cellulitis of unspecified part of limb: Secondary | ICD-10-CM | POA: Diagnosis not present

## 2024-07-28 MED ORDER — DOXYCYCLINE HYCLATE 100 MG PO CAPS
100.0000 mg | ORAL_CAPSULE | Freq: Two times a day (BID) | ORAL | 0 refills | Status: DC
  Filled 2024-07-28 (×2): qty 20, 10d supply, fill #0

## 2024-07-28 MED ORDER — COMIRNATY 30 MCG/0.3ML IM SUSY
0.3000 mL | PREFILLED_SYRINGE | Freq: Once | INTRAMUSCULAR | 0 refills | Status: AC
  Filled 2024-07-28: qty 0.3, 1d supply, fill #0

## 2024-07-29 DIAGNOSIS — M545 Low back pain, unspecified: Secondary | ICD-10-CM | POA: Diagnosis not present

## 2024-07-29 DIAGNOSIS — G8929 Other chronic pain: Secondary | ICD-10-CM | POA: Diagnosis not present

## 2024-07-29 DIAGNOSIS — R29898 Other symptoms and signs involving the musculoskeletal system: Secondary | ICD-10-CM | POA: Diagnosis not present

## 2024-07-29 DIAGNOSIS — M48062 Spinal stenosis, lumbar region with neurogenic claudication: Secondary | ICD-10-CM | POA: Diagnosis not present

## 2024-07-29 DIAGNOSIS — R2689 Other abnormalities of gait and mobility: Secondary | ICD-10-CM | POA: Diagnosis not present

## 2024-07-29 DIAGNOSIS — R2681 Unsteadiness on feet: Secondary | ICD-10-CM | POA: Diagnosis not present

## 2024-08-04 DIAGNOSIS — C679 Malignant neoplasm of bladder, unspecified: Secondary | ICD-10-CM | POA: Diagnosis not present

## 2024-08-08 ENCOUNTER — Encounter: Payer: Self-pay | Admitting: Family

## 2024-08-08 ENCOUNTER — Encounter (HOSPITAL_BASED_OUTPATIENT_CLINIC_OR_DEPARTMENT_OTHER): Payer: Medicare Other | Admitting: Psychology

## 2024-08-08 DIAGNOSIS — G479 Sleep disorder, unspecified: Secondary | ICD-10-CM

## 2024-08-08 DIAGNOSIS — G894 Chronic pain syndrome: Secondary | ICD-10-CM | POA: Diagnosis not present

## 2024-08-08 DIAGNOSIS — L821 Other seborrheic keratosis: Secondary | ICD-10-CM | POA: Diagnosis not present

## 2024-08-08 DIAGNOSIS — B353 Tinea pedis: Secondary | ICD-10-CM | POA: Diagnosis not present

## 2024-08-08 DIAGNOSIS — G4733 Obstructive sleep apnea (adult) (pediatric): Secondary | ICD-10-CM | POA: Diagnosis not present

## 2024-08-08 DIAGNOSIS — F331 Major depressive disorder, recurrent, moderate: Secondary | ICD-10-CM

## 2024-08-08 DIAGNOSIS — F411 Generalized anxiety disorder: Secondary | ICD-10-CM | POA: Diagnosis not present

## 2024-08-08 DIAGNOSIS — Z85828 Personal history of other malignant neoplasm of skin: Secondary | ICD-10-CM | POA: Diagnosis not present

## 2024-08-08 DIAGNOSIS — L570 Actinic keratosis: Secondary | ICD-10-CM | POA: Diagnosis not present

## 2024-08-08 DIAGNOSIS — L309 Dermatitis, unspecified: Secondary | ICD-10-CM | POA: Diagnosis not present

## 2024-08-08 DIAGNOSIS — B351 Tinea unguium: Secondary | ICD-10-CM | POA: Diagnosis not present

## 2024-08-08 DIAGNOSIS — M793 Panniculitis, unspecified: Secondary | ICD-10-CM | POA: Diagnosis not present

## 2024-08-08 NOTE — Progress Notes (Signed)
 Neuropsychology Visit  Patient:  Mary Buck   DOB: [birth year]  MR Number: [id number]  Location: [facility] [place] [facility] [address] [place], [address] [place] [address] Dept: [phone]  Date of Service: 07/18/2024  Start: 1 PM End: 2 PM  Today's visit was an in person visit was conducted in my outpatient clinic office.  The patient myself were present.  The visit today included the use of a digital scribe with its reason for use and aspect of HIPAA compliance and how the information would be used reviewed with the patient with patient complying and allowing to use a digital scribe to aid in them taking today.  Duration of Service: 1 Hour  Provider/Observer:     Norleen JONELLE Asa PsyD  Chief Complaint:      Chief Complaint  Patient presents with   Anxiety   Depression   Pain   Sleeping Problem    Reason For Service:     Mary Buck is an 88 year old female with a history of anxiety and depressive symptomatology since adolescence. History of insomnia since age 89. History of OCD and anxiety symptoms have been persistent throughout life. A recent neuropsychological evaluation for cognitive changes showed good performance on most measures. Memory issues are considered likely related to anxiety and normal age-related changes.   Reports a muddled mind and trouble concentrating, which she attributes to a recent 10-day course of doxycycline . She experienced a significant anxiety attack on the ninth day of the antibiotic, characterized by catastrophic thoughts of dying and left arm pain, which she interpreted as a sign of a heart attack. Symptoms subsided with distraction. She also reports episodes of drifting off and experiencing vivid, intrusive memories of her past, particularly involving her mother. These episodes are associated with feelings of regret. Reports feeling hurt by her cat's  perceived preference for her partner, Mary Buck.  Treatment Interventions:  Psychoeducation was provided regarding cognitive distortions, specifically anthropomorphizing and personalizing events. The example of her cat's behavior was used to illustrate concepts of classical conditioning and variable reinforcement schedules, reframing the cat's actions as responses to reinforcement rather than personal rejection. This was connected to her childhood experiences, particularly feelings of inadequacy and sibling rivalry (what about me?). The concept of forgiveness was discussed as no longer seeking revenge and putting her parents' actions into context, viewing them as individuals with their own struggles. The goal was to help her de-personalize past and present events and reduce the associated anger and distress.  Participation Level:   Active  Participation Quality:  Appropriate      Behavioral Observation:  Well Groomed, Alert, and Appropriate.   Current Psychosocial Factors: Reports significant distress related to feelings of inadequacy, rejection, and jealousy. These feelings are triggered by current events, such as her cat's behavior, and are linked to long-standing issues from her childhood, including her relationship with her mother and birth order dynamics with her siblings. Expresses lingering anger towards her mother for past verbal mistreatment and perceived emotional neglect. Describes a history of her father being emotionally distant and having affairs. These historical dynamics appear to inform her current interpretations of interpersonal relationships.  Content of Session:    The session utilized observation on her distress over her cat appearing to prefer her partner to gain further understanding of her various coping strategies. This was explored as a manifestation of her core feelings of inadequacy and jealousy, stemming from childhood experiences. Psychoeducation was  provided on  anthropomorphism and the tendency to personalize the behavior of others. The clinician used classical conditioning principles to explain the cat's behavior, linking it to reinforcement patterns rather than emotional preference. This was used as an analogue to explore her childhood experiences, relationship with her mother, and the concept of forgiveness as a means of releasing anger by contextualizing her parents' behavior.  Effectiveness of Interventions: Engaged well with the reframing of her cat's behavior and the connection to her past experiences. Showed insight into the pattern of personalization. She remained focused on her desire to resolve her anger towards her mother, similar to how her partner, Mary Buck, resolved her own.    Target Goals:   We continue to work issues related to coping skills around her anxiety and adjustment to significant changes in physical functioning.  Patient continues with significant back pain and gait changes that limit her physical activity.  Patient has been diagnosed with obstructive sleep apnea but has not been able to tolerate a CPAP machine when she was initially in the fitting.  The patient's loss of function continues to be quite problematic.  The patient continues to do very well cognitively with good memory, reasoning and executive functioning etc.  Goals Last Reviewed:   08/08/2024  Goals Addressed Today:    Addressed catastrophizing in relation to the anxiety attack while on doxycycline . Addressed cognitive distortions, particularly personalization and anthropomorphism, in the context of her relationship with her cat and its connection to past familial dynamics. Explored underlying anger toward her mother and the difficulty in letting it go.  Impression/Diagnosis:   Mary Buck is an 88 year old female with a history of anxiety and depressive symptomatology since adolescence. History of insomnia since age 49. History of OCD and anxiety symptoms have been  persistent throughout life. A recent neuropsychological evaluation for cognitive changes showed good performance on most measures. Memory issues are considered likely related to anxiety and normal age-related changes.  Diagnosis:   Moderate episode of recurrent major depressive disorder (HCC)  Generalized anxiety disorder  Chronic pain syndrome  Sleep disturbance  OSA (obstructive sleep apnea)    Norleen Asa, Psy.D. Clinical Psychologist Neuropsychologist

## 2024-08-09 ENCOUNTER — Encounter: Payer: Self-pay | Admitting: Family

## 2024-08-17 ENCOUNTER — Other Ambulatory Visit: Payer: Self-pay | Admitting: Family Medicine

## 2024-08-25 ENCOUNTER — Encounter: Attending: Psychology | Admitting: Psychology

## 2024-08-25 DIAGNOSIS — G479 Sleep disorder, unspecified: Secondary | ICD-10-CM

## 2024-08-25 DIAGNOSIS — F331 Major depressive disorder, recurrent, moderate: Secondary | ICD-10-CM | POA: Diagnosis present

## 2024-08-25 DIAGNOSIS — G894 Chronic pain syndrome: Secondary | ICD-10-CM

## 2024-08-25 DIAGNOSIS — G4733 Obstructive sleep apnea (adult) (pediatric): Secondary | ICD-10-CM | POA: Diagnosis present

## 2024-08-25 DIAGNOSIS — F411 Generalized anxiety disorder: Secondary | ICD-10-CM | POA: Diagnosis present

## 2024-08-29 ENCOUNTER — Encounter: Payer: Self-pay | Admitting: Psychology

## 2024-08-29 NOTE — Progress Notes (Signed)
 Neuropsychology Visit  Patient:  MOLLI GETHERS   DOB: 11-16-1935  MR Number: [id number]  Location: [facility] [place] [facility] [address] [place], [address] [place] [address] Dept: [phone]  Date of Service: 08/25/2024  Start: 1 PM End: 2 PM  Today's visit was an in person visit was conducted in my outpatient clinic office.  The patient myself were present.  The visit today included the use of a digital scribe with its reason for use and aspect of HIPAA compliance and how the information would be used reviewed with the patient with patient complying and allowing to use a digital scribe to aid in them taking today.  Duration of Service: 1 Hour  Provider/Observer:     Norleen JONELLE Asa PsyD  Chief Complaint:      Chief Complaint  Patient presents with   Anxiety   Depression   Pain   Sleeping Problem    Reason For Service:     Galen Malkowski is an 88 year old female with a history of anxiety and depressive symptomatology since adolescence. History of insomnia since age 52. History of OCD and anxiety symptoms have been persistent throughout life. A recent neuropsychological evaluation for cognitive changes showed good performance on most measures. Memory issues are considered likely related to anxiety and normal age-related changes.   Reports worsening balance despite engaging in physical therapy. Expresses frustration and impatience with the lack of progress and a sense of discouragement, mentioning that others have suggested she will not get better and will only decline with age. Discusses challenges with mobility and safety at home, but notes home modifications are in place, including a walk-in shower with a bench and a professionally installed grab bar, and an external railing.  Treatment Interventions:  Psychoeducation was provided regarding cognitive distortions, specifically  anthropomorphizing and personalizing events. The example of her cat's behavior was used to illustrate concepts of classical conditioning and variable reinforcement schedules, reframing the cat's actions as responses to reinforcement rather than personal rejection. This was connected to her childhood experiences, particularly feelings of inadequacy and sibling rivalry (what about me?). The concept of forgiveness was discussed as no longer seeking revenge and putting her parents' actions into context, viewing them as individuals with their own struggles. The goal was to help her de-personalize past and present events and reduce the associated anger and distress.  Participation Level:   Active  Participation Quality:  Appropriate      Behavioral Observation:  Well Groomed, Alert, and Appropriate.   Current Psychosocial Factors: Currently exploring assisted living options with her partner, Larry. They have reviewed several facilities, including [facility] and [facility]. They found [facility] to be too large and impersonal. They are leaning towards [facility], where the reported wait time is one to two years. She expressed no desire to leave her current home and voiced concerns about the affordability of moving back to the [place]. She feels she and [name] cannot afford it, despite [name]'s income. She also describes a past negative experience with a neighbor who verbally harassed her, causing significant fear. Expresses distress over her cat's perceived preference for her partner.  Content of Session:    Reviewed the patient's experience in physical therapy and her feelings of discouragement regarding her balance. Discussed the nature of progress in rehabilitation and the importance of home exercises. Explored home safety modifications. Discussed the process of selecting an assisted living facility. Explored past traumatic experiences with a neighbor. A  psychoeducational discussion was held regarding  cognitive styles, fear, ego-dystonic feelings, and the neurological basis for different perspectives and learning disabilities, using the patient's own reflective capacity as a positive example that has allowed for an authentic life, while also acknowledging its costs.  Effectiveness of Interventions: Engaged well with the reframing of her cat's behavior and the connection to her past experiences. Showed insight into the pattern of personalization. She remained focused on her desire to resolve her anger towards her mother, similar to how her partner, Larry, resolved her own.    Target Goals:   We continue to work issues related to coping skills around her anxiety and adjustment to significant changes in physical functioning.  Patient continues with significant back pain and gait changes that limit her physical activity.  Patient has been diagnosed with obstructive sleep apnea but has not been able to tolerate a CPAP machine when she was initially in the fitting.  The patient's loss of function continues to be quite problematic.  The patient continues to do very well cognitively with good memory, reasoning and executive functioning etc.  Goals Last Reviewed:   08/25/2024  Goals Addressed Today:    Addressed feelings of discouragement related to physical therapy progress. Provided psychoeducation on the benefits of continued effort in therapy, even with slow progress. Discussed the importance of a home exercise program. Encouraged asking the physical therapist for exercises to do between visits. Reviewed home safety measures, confirming appropriate modifications are in place. Explored psychosocial stressors related to long-term living decisions and past interpersonal conflicts. Addressed cognitive and emotional experiences, linking them to underlying psychological and neurological frameworks.  Impression/Diagnosis:   Ophia Shamoon is an 88 year old female with a history of anxiety and depressive  symptomatology since adolescence. History of insomnia since age 70. History of OCD and anxiety symptoms have been persistent throughout life. A recent neuropsychological evaluation for cognitive changes showed good performance on most measures. Memory issues are considered likely related to anxiety and normal age-related changes.  Diagnosis:   Moderate episode of recurrent major depressive disorder (HCC)  Generalized anxiety disorder  Chronic pain syndrome  Sleep disturbance  OSA (obstructive sleep apnea)    Norleen Asa, Psy.D. Clinical Psychologist Neuropsychologist

## 2024-09-03 NOTE — Progress Notes (Unsigned)
 [facility] [address], [address] [place], [place] [address] [phone]  Date:  09/05/2024   Name:  SAADIA DEWITT   DOB:  [birth year]   MRN:  [id number]  PCP:  Watt Harlene JAYSON, MD    Chief Complaint: No chief complaint on file.   History of Present Illness:  Mary Buck is a 88 y.o. very pleasant female patient who presents with the following:  Pt seen today with concern of decreased hearing  Last seen by me in November  She has history of osteopenia, degenerative disc disease in her back and spinal stenosis, bladder cancer, fairly extensive lower extremity skin cancer, hypothyroidism, hyperlipidemia, asthma and allergies   Discussed the use of AI scribe software for clinical note transcription with the patient, who gave verbal consent to proceed.  History of Present Illness     Patient Active Problem List   Diagnosis Date Noted   Diarrhea 04/12/2024   Hordeolum externum of right lower eyelid 04/12/2024   S/P hip replacement, left 01/20/2023   Thyroid  disease    Heart murmur    GERD (gastroesophageal reflux disease)    Depression    Cataract    Cancer (HCC)    Asthma    Allergy    IDA (iron deficiency anemia) 05/31/2020   Osteopenia 05/30/2019   Pedal edema 01/31/2018   Squamous cell carcinoma of skin of left lower limb, including hip 12/16/2017   Plantar fasciitis 07/07/2017   DDD (degenerative disc disease), lumbar 06/01/2017   Fibromyalgia 06/01/2017   Spinal stenosis at L4-L5 level 04/30/2017   Peripheral neuropathy 04/30/2017   Encounter for therapeutic drug monitoring 03/05/2017   Bladder cancer (HCC) 08/20/2015   Arthropathy of lumbar facet joint 07/27/2015   Spinal stenosis of cervical region 07/27/2015   Spondylolisthesis 07/27/2015   Hypothyroid 07/05/2015   Abdominal pain, acute, left upper quadrant 06/28/2015   Basal cell carcinoma, face 06/28/2015   Left groin pain 06/28/2015    Chronic pain 06/28/2015   Fatigue 06/28/2015   Hyperlipidemia 06/28/2015   Bilateral hip pain 06/28/2015   Paresthesia of lower lip 06/28/2015   Pulmonary nodule 06/28/2015   Pustulosis palmaris et plantaris 06/28/2015   Ulcer of nose 06/28/2015   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   Mass of parotid gland 03/24/2014    Past Medical History:  Diagnosis Date   Allergy    Asthma    as teen   Cataract    DDD (degenerative disc disease), lumbar 06/01/2017   Depression    GERD (gastroesophageal reflux disease)    Heart murmur    no issues   Hyperlipidemia    Hypothyroidism    IDA (iron deficiency anemia) 05/31/2020   Peripheral neuropathy 04/30/2017   Skin cancer    left leg had radiation, bladder cancer   Sleep apnea    no CPAP   Thyroid  disease     Past Surgical History:  Procedure Laterality Date   bladder cancer  2013   CARPAL TUNNEL RELEASE  2015   CATARACT EXTRACTION Bilateral 2012   CHOLECYSTECTOMY     COLONOSCOPY WITH ESOPHAGOGASTRODUODENOSCOPY (EGD)  02/22/2018   [facility]   SALIVARY GLAND SURGERY Left 2015   benign   SPINE SURGERY  6/31/21   vertaflex   TONSILLECTOMY AND ADENOIDECTOMY  1943   TOTAL HIP ARTHROPLASTY Left 01/20/2023   Procedure: TOTAL HIP ARTHROPLASTY;  Surgeon: Josefina Chew, MD;  Location: [facility];  Service: Orthopedics;  Laterality: Left;    Social History[1]  Family History  Problem Relation Age of Onset   Macular degeneration Mother    Hyperlipidemia Father    Stroke Father    Cancer Brother        ? stomach   Prostate cancer Other        in brothers   Colon cancer Neg Hx     Allergies[2]  Medication list has been reviewed and updated.  Medications Ordered Prior to Encounter[3]  Review of Systems:  As per HPI- otherwise negative.'er  Physical Examination: There were no vitals filed for this visit. There were no vitals filed for this visit. There is no height or weight on file to calculate  BMI. Ideal Body Weight:    GEN: no acute distress. HEENT: Atraumatic, Normocephalic.  Ears and Nose: No external deformity. CV: RRR, No M/G/R. No JVD. No thrill. No extra heart sounds. PULM: CTA B, no wheezes, crackles, rhonchi. No retractions. No resp. distress. No accessory muscle use. ABD: S, NT, ND, +BS. No rebound. No HSM. EXTR: No c/c/e PSYCH: Normally interactive. Conversant.    Assessment and Plan: No diagnosis found.  Assessment & Plan   Signed Harlene Schroeder, MD    [1]  Social History Tobacco Use   Smoking status: Former    Current packs/day: 0.00    Types: Cigarettes    Quit date: 2015    Years since quitting: 10.9   Smokeless tobacco: Never  Vaping Use   Vaping status: Never Used  Substance Use Topics   Alcohol  use: No   Drug use: No  [2]  Allergies Allergen Reactions   Contrast Media [Iodinated Contrast Media] Anaphylaxis   Other Anaphylaxis    Sulfa    X-ray diagnostic materials   Oxycodone Anaphylaxis    my throat swells with any opioid (tolerated hydrocodone  01/21/23)   Latex Other (See Comments)    Other reaction(s): blistering, redness    Mirtazapine Nausea Only   Sulfur Other (See Comments)   Acitretin Rash   Cephalexin Other (See Comments)    Unknown (tolerated Ancef  01/21/23)   Elemental Sulfur Other (See Comments)    unknown   Penicillins Other (See Comments)    Unknown   Sulfa Antibiotics Other (See Comments)    Unknown  [3]  Current Outpatient Medications on File Prior to Visit  Medication Sig Dispense Refill   acetaminophen  (TYLENOL ) 325 MG tablet Take 650 mg by mouth 3 (three) times daily as needed.     Carboxymethylcellulose Sod PF (LUBRICANT EYE DROPS PF) 0.5 % SOLN Place 1 drop into both eyes 4 (four) times daily as needed (dry eyes).     doxycycline  (VIBRAMYCIN ) 100 MG capsule Take 1 capsule (100 mg total) by mouth 2 (two) times daily. 20 capsule 0   famotidine  (PEPCID ) 20 MG tablet TAKE 1 TABLET(20 MG) BY MOUTH AT BEDTIME  90 tablet 0   gabapentin  (NEURONTIN ) 100 MG capsule Take 100 mg by mouth 3 (three) times daily.     lansoprazole  (PREVACID ) 30 MG capsule Take 1 capsule (30 mg total) by mouth 2 (two) times daily. 180 capsule 1   levothyroxine  (SYNTHROID ) 88 MCG tablet Take 1 tablet (88 mcg total) by mouth daily before breakfast. 90 tablet 1   misoprostol  (CYTOTEC ) 100 MCG tablet TAKE 2 TABLET BY MOUTH AFTER BREAKFAST AND AFTER DINNER AS DIRECTED. KEEP APPOINTMENT FOR FURTHER FILLS. 120 tablet 5   mupirocin  ointment (BACTROBAN ) 2 % Apply 1 Application topically 2 (  two) times daily. Use as needed for nose sores 22 g 0   Probiotic Product (RISAQUAD PO) Take 1 capsule by mouth daily.     simvastatin  (ZOCOR ) 20 MG tablet TAKE 1 TABLET(20 MG) BY MOUTH DAILY 90 tablet 1   traMADol  (ULTRAM ) 50 MG tablet Take 50 mg by mouth.     Vitamin D-Vitamin K (VITAMIN K2-VITAMIN D3 PO) Take 1 tablet by mouth daily.     No current facility-administered medications on file prior to visit.   "

## 2024-09-05 ENCOUNTER — Ambulatory Visit: Admitting: Family Medicine

## 2024-09-05 ENCOUNTER — Encounter: Payer: Self-pay | Admitting: Family Medicine

## 2024-09-05 VITALS — BP 138/74 | HR 62 | Ht 64.0 in | Wt 152.4 lb

## 2024-09-05 DIAGNOSIS — R194 Change in bowel habit: Secondary | ICD-10-CM | POA: Diagnosis not present

## 2024-09-05 DIAGNOSIS — H9202 Otalgia, left ear: Secondary | ICD-10-CM

## 2024-09-05 DIAGNOSIS — J3489 Other specified disorders of nose and nasal sinuses: Secondary | ICD-10-CM | POA: Diagnosis not present

## 2024-09-05 NOTE — Patient Instructions (Signed)
 It was great to see you today- I will be in touch with your labs asap Let me know if you have any other concerns

## 2024-09-06 ENCOUNTER — Encounter: Payer: Self-pay | Admitting: Family Medicine

## 2024-09-06 LAB — COMPREHENSIVE METABOLIC PANEL WITH GFR
ALT: 12 U/L (ref 3–35)
AST: 17 U/L (ref 5–37)
Albumin: 3.8 g/dL (ref 3.5–5.2)
Alkaline Phosphatase: 69 U/L (ref 39–117)
BUN: 13 mg/dL (ref 6–23)
CO2: 33 meq/L — ABNORMAL HIGH (ref 19–32)
Calcium: 9 mg/dL (ref 8.4–10.5)
Chloride: 105 meq/L (ref 96–112)
Creatinine, Ser: 0.79 mg/dL (ref 0.40–1.20)
GFR: 67.94 mL/min
Glucose, Bld: 88 mg/dL (ref 70–99)
Potassium: 4.8 meq/L (ref 3.5–5.1)
Sodium: 143 meq/L (ref 135–145)
Total Bilirubin: 0.4 mg/dL (ref 0.2–1.2)
Total Protein: 6.4 g/dL (ref 6.0–8.3)

## 2024-09-06 LAB — CBC
HCT: 35.7 % — ABNORMAL LOW (ref 36.0–46.0)
Hemoglobin: 12 g/dL (ref 12.0–15.0)
MCHC: 33.6 g/dL (ref 30.0–36.0)
MCV: 95.5 fl (ref 78.0–100.0)
Platelets: 221 K/uL (ref 150.0–400.0)
RBC: 3.74 Mil/uL — ABNORMAL LOW (ref 3.87–5.11)
RDW: 14.1 % (ref 11.5–15.5)
WBC: 6 K/uL (ref 4.0–10.5)

## 2024-09-06 LAB — TSH: TSH: 2.95 u[IU]/mL (ref 0.35–5.50)

## 2024-09-12 ENCOUNTER — Encounter: Admitting: Psychology

## 2024-09-12 DIAGNOSIS — F411 Generalized anxiety disorder: Secondary | ICD-10-CM

## 2024-09-12 DIAGNOSIS — G479 Sleep disorder, unspecified: Secondary | ICD-10-CM | POA: Diagnosis not present

## 2024-09-12 DIAGNOSIS — G894 Chronic pain syndrome: Secondary | ICD-10-CM | POA: Diagnosis not present

## 2024-09-12 DIAGNOSIS — F331 Major depressive disorder, recurrent, moderate: Secondary | ICD-10-CM

## 2024-09-12 DIAGNOSIS — G4733 Obstructive sleep apnea (adult) (pediatric): Secondary | ICD-10-CM
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
## 2024-09-22 ENCOUNTER — Encounter: Payer: Self-pay | Admitting: Family Medicine

## 2024-09-22 ENCOUNTER — Ambulatory Visit: Admitting: Psychology

## 2024-09-22 DIAGNOSIS — M545 Low back pain, unspecified: Secondary | ICD-10-CM

## 2024-09-22 DIAGNOSIS — R2681 Unsteadiness on feet: Secondary | ICD-10-CM

## 2024-09-27 ENCOUNTER — Other Ambulatory Visit: Payer: Self-pay | Admitting: Gastroenterology

## 2024-09-28 ENCOUNTER — Ambulatory Visit (INDEPENDENT_AMBULATORY_CARE_PROVIDER_SITE_OTHER): Admitting: Gastroenterology

## 2024-09-28 ENCOUNTER — Ambulatory Visit
Admission: RE | Admit: 2024-09-28 | Discharge: 2024-09-28 | Disposition: A | Discharge status: Home / Self Care | Source: Ambulatory Visit | Attending: Gastroenterology | Admitting: Gastroenterology

## 2024-09-28 ENCOUNTER — Encounter: Payer: Self-pay | Admitting: Gastroenterology

## 2024-09-28 VITALS — BP 122/70 | HR 69 | Ht 64.0 in | Wt 152.0 lb

## 2024-09-28 DIAGNOSIS — R143 Flatulence: Secondary | ICD-10-CM

## 2024-09-28 DIAGNOSIS — K219 Gastro-esophageal reflux disease without esophagitis: Secondary | ICD-10-CM

## 2024-09-28 DIAGNOSIS — R194 Change in bowel habit: Secondary | ICD-10-CM | POA: Diagnosis not present

## 2024-09-28 DIAGNOSIS — Z7189 Other specified counseling: Secondary | ICD-10-CM

## 2024-09-28 DIAGNOSIS — R1312 Dysphagia, oropharyngeal phase: Secondary | ICD-10-CM

## 2024-09-28 DIAGNOSIS — Z760 Encounter for issue of repeat prescription: Secondary | ICD-10-CM

## 2024-09-28 MED ORDER — MISOPROSTOL 100 MCG PO TABS: ORAL_TABLET | ORAL | 3 refills | Status: AC

## 2024-09-28 MED ORDER — FAMOTIDINE 20 MG PO TABS: 20.0000 mg | ORAL_TABLET | Freq: Every day | ORAL | 3 refills | Status: AC

## 2024-09-28 NOTE — Progress Notes (Signed)
 "  Chief Complaint:    Increased gas, change in bowel habits  GI History: 89 y.o. female with a history of chest pain, spinal stenosis with neurogenic claudication (follows at pain management), bladder CA, hypothyroidism, HLD, peripheral neuropathy, initially seen in the GI clinic in 04/2020 for evaluation of loose, nonbloody stools (but not watery diarrhea) w/ urgency.  Symptoms started following spine surgery approximately 5 weeks earlier, and was improving with dietary modifications alone at the time of initial appointment.   History of IBS-D.  Previously followed with a Solicitor in [place] for a longstanding history of loose, irregular stools, but described as non-bothersome to patient.  Essentially well-controlled with dietary modification alone.  Last colonoscopy 2019 in [place].   Longstanding history of reflux, well controlled with Prevacid  BID and misoprostol  (started in 2019 for gastritis on EGD), which she has been taking for years prescribed by her previous GI.  Worse with coffee and overeating.  Regurgitation with forward flexion. EGD 2019 in [place].   Endoscopic History -EGD (02/22/2018, Dr. Rock Houston, MD): Abnormal esophageal motility compatible with presbyesophagus,  normal esophageal mucosa, acute gastritis at GE junction, small sliding 2 cm hiatal hernia, bile induced gastritis with fluid in antrum. (path: non-H pylori gastritis) -Colonoscopy (02/22/2018, Dr. Rock Houston, MD): Normal  HPI:     Patient is a 89 y.o. female presenting to the Gastroenterology Clinic for routine follow-up.  Was last seen in the GI clinic on 09/04/2023.  Reflux and IBS-D were well-controlled, refilled lansoprazole , famotidine , misoprostol  and 1 year follow-up.  Main issue today is increased gas production (increased flatus).  Used Tums which helped. No changes in diet, medications, etc.   Was seen by her PCP on 09/05/2024 and endorsed change in bowel habits vacillating between  constipation and soft stools with urgency.  No associated hematochezia, melena.  Labs were unremarkable to include normal TSH, CBC, CMP.  Separately, describes trouble tranferring liquids mouth to esophagus. Has been present for years. No issue tolerating solids.  No esophageal dysphagia.  Prior history of left salivary gland surgery ~8-9 years.  No neck/chest radiation.  Needs refills on misoprostol , famotidine .    Review of systems:     No chest pain, no SOB, no fevers, no urinary sx   Past Medical History:  Diagnosis Date   Allergy    Asthma    as teen   Cataract    DDD (degenerative disc disease), lumbar 06/01/2017   Depression    GERD (gastroesophageal reflux disease)    Heart murmur    no issues   Hyperlipidemia    Hypothyroidism    IDA (iron deficiency anemia) 05/31/2020   Peripheral neuropathy 04/30/2017   Skin cancer    left leg had radiation, bladder cancer   Sleep apnea    no CPAP   Thyroid  disease     Patient's surgical history, family medical history, social history, medications and allergies were all reviewed in Epic    Current Outpatient Medications  Medication Sig Dispense Refill   acetaminophen  (TYLENOL ) 325 MG tablet Take 650 mg by mouth 3 (three) times daily as needed.     Carboxymethylcellulose Sod PF (LUBRICANT EYE DROPS PF) 0.5 % SOLN Place 1 drop into both eyes 4 (four) times daily as needed (dry eyes).     famotidine  (PEPCID ) 20 MG tablet TAKE 1 TABLET(20 MG) BY MOUTH AT BEDTIME 90 tablet 0   gabapentin  (NEURONTIN ) 100 MG capsule Take 100 mg by mouth 3 (three) times daily.  lansoprazole  (PREVACID ) 30 MG capsule Take 1 capsule (30 mg total) by mouth 2 (two) times daily. 180 capsule 1   levothyroxine  (SYNTHROID ) 88 MCG tablet Take 1 tablet (88 mcg total) by mouth daily before breakfast. 90 tablet 1   misoprostol  (CYTOTEC ) 100 MCG tablet TAKE 2 TABLET BY MOUTH AFTER BREAKFAST AND AFTER DINNER AS DIRECTED. NEEDS APPOINTMENT FOR FURTHER FILLS. 120  tablet 0   mupirocin  ointment (BACTROBAN ) 2 % Apply 1 Application topically 2 (two) times daily. Use as needed for nose sores 22 g 0   Probiotic Product (RISAQUAD PO) Take 1 capsule by mouth daily.     simvastatin  (ZOCOR ) 20 MG tablet TAKE 1 TABLET(20 MG) BY MOUTH DAILY 90 tablet 1   traMADol  (ULTRAM ) 50 MG tablet Take 50 mg by mouth.     Vitamin D-Vitamin K (VITAMIN K2-VITAMIN D3 PO) Take 1 tablet by mouth daily.     No current facility-administered medications for this visit.    Physical Exam:     BP 122/70   Pulse 69   Ht 5' 4 (1.626 m)   Wt 152 lb (68.9 kg)   BMI 26.09 kg/m   GENERAL:  Pleasant female in NAD CARDIAC:  RRR, no murmur heard, no peripheral edema PULM: Normal respiratory effort, lungs CTA bilaterally, no wheezing ABDOMEN:  Nondistended, soft, nontender. No obvious masses, no hepatomegaly,  normal bowel sounds SKIN:  turgor, no lesions seen Musculoskeletal:  Normal muscle tone, normal strength NEURO: Alert and oriented x 3, no focal neurologic deficits   IMPRESSION and PLAN:    1) Change in bowel habits 2) Increased flatus Reports a prior history of constipation in the past.  Now with undulating bowel habits and increased flatus.  Suspicious for constipation with overflow diarrhea. - Abdominal X-ray two-view today to evaluate for retained stool burden. - Discussed the role/utility of laxatives depending on x-ray results - Start Benefiber - Try to maintain adequate hydration with a goal of 64 ounces of water daily  3) Oropharyngeal dysphagia - Referral to Speech Pathology  4) GERD 5) Medication refill - Reflux symptoms well-controlled on current therapy.  Recently refilled Aciphex - Refill placed for famotidine  - Per patient request, refill placed for misoprostol .  Discussed role/utility of continued misoprostol .  Prescribed by previous Gastroenterologist for history of gastritis reduce risk of medication induced PUD.  She would very much like to resume.   Refill placed.  I spent 35 minutes of time, including in depth chart review, independent review of results as outlined above, communicating results with the patient directly, face-to-face time with the patient, coordinating care, and ordering studies and medications as appropriate, and documentation.            Sandor GAILS Gustave Lindeman ,DO, FACG 09/28/2024, 11:12 AM  "

## 2024-09-28 NOTE — Patient Instructions (Addendum)
 _______________________________________________________  If your blood pressure at your visit was 140/90 or greater, please contact your primary care physician to follow up on this.  _______________________________________________________  If you are age 87 or older, your body mass index should be between 23-30. Your Body mass index is 26.09 kg/m. If this is out of the aforementioned range listed, please consider follow up with your Primary Care Provider.  If you are age 82 or younger, your body mass index should be between 19-25. Your Body mass index is 26.09 kg/m. If this is out of the aformentioned range listed, please consider follow up with your Primary Care Provider.   ________________________________________________________  The [facility] providers would like to encourage you to use MYCHART to communicate with providers for non-urgent requests or questions.  Due to long hold times on the telephone, sending your provider a message by [facility] may be a faster and more efficient way to get a response.  Please allow 48 business hours for a response.  Please remember that this is for non-urgent requests.  _______________________________________________________  [facility] is using a team-based approach to care.  Your team is made up of your doctor and two to three APPS. Our APPS (Nurse Practitioners and Physician Assistants) work with your physician to ensure care continuity for you. They are fully qualified to address your health concerns and develop a treatment plan. They communicate directly with your gastroenterologist to care for you. Seeing the Advanced Practice Practitioners on your physician's team can help you by facilitating care more promptly, often allowing for earlier appointments, access to diagnostic testing, procedures, and other specialty referrals.   Your provider has requested that you have an abdominal x ray before leaving today. Please go to the basement floor  to our Radiology department for the test.  We have sent the following medications to your pharmacy for you to pick up at your convenience: Pepcid  Misoprostol   You have been referred to speech therapy. If they haven't called you in 2 weeks please call them.  It was a pleasure to see you today!  Vito Cirigliano, D.O.

## 2024-09-29 ENCOUNTER — Ambulatory Visit: Admitting: Psychology

## 2024-10-05 ENCOUNTER — Ambulatory Visit: Payer: Self-pay | Admitting: Gastroenterology

## 2024-10-27 ENCOUNTER — Ambulatory Visit: Admitting: Psychology

## 2024-10-28 ENCOUNTER — Inpatient Hospital Stay: Payer: Medicare Other

## 2024-10-28 ENCOUNTER — Ambulatory Visit: Payer: Medicare Other | Admitting: Family

## 2024-11-10 ENCOUNTER — Ambulatory Visit: Admitting: Psychology

## 2024-11-14 ENCOUNTER — Ambulatory Visit: Admitting: Speech Pathology

## 2024-11-24 ENCOUNTER — Encounter: Admitting: Psychology

## 2024-12-08 ENCOUNTER — Encounter: Admitting: Psychology

## 2025-01-09 ENCOUNTER — Encounter: Admitting: Psychology

## 2025-02-02 ENCOUNTER — Encounter: Admitting: Psychology

## 2025-02-09 ENCOUNTER — Encounter: Admitting: Psychology

## 2025-05-12 ENCOUNTER — Inpatient Hospital Stay

## 2025-05-12 ENCOUNTER — Ambulatory Visit: Admitting: Family

## 2025-07-06 ENCOUNTER — Ambulatory Visit
